# Patient Record
Sex: Female | Born: 1940
Health system: Southern US, Community
[De-identification: ages and names within clinical notes are randomized; demographics above are authoritative.]

## PROBLEM LIST (undated history)

## (undated) ENCOUNTER — Ambulatory Visit: Admission: EM | Payer: 59 | Source: Home / Self Care

## (undated) DIAGNOSIS — D869 Sarcoidosis, unspecified: Secondary | ICD-10-CM

## (undated) DIAGNOSIS — K219 Gastro-esophageal reflux disease without esophagitis: Secondary | ICD-10-CM

## (undated) DIAGNOSIS — I10 Essential (primary) hypertension: Secondary | ICD-10-CM

## (undated) DIAGNOSIS — Z8619 Personal history of other infectious and parasitic diseases: Secondary | ICD-10-CM

## (undated) DIAGNOSIS — E119 Type 2 diabetes mellitus without complications: Secondary | ICD-10-CM

## (undated) DIAGNOSIS — F32A Depression, unspecified: Secondary | ICD-10-CM

## (undated) DIAGNOSIS — I700 Atherosclerosis of aorta: Secondary | ICD-10-CM

## (undated) DIAGNOSIS — I499 Cardiac arrhythmia, unspecified: Secondary | ICD-10-CM

## (undated) DIAGNOSIS — I219 Acute myocardial infarction, unspecified: Secondary | ICD-10-CM

## (undated) DIAGNOSIS — E559 Vitamin D deficiency, unspecified: Secondary | ICD-10-CM

## (undated) DIAGNOSIS — K5792 Diverticulitis of intestine, part unspecified, without perforation or abscess without bleeding: Secondary | ICD-10-CM

## (undated) DIAGNOSIS — F329 Major depressive disorder, single episode, unspecified: Secondary | ICD-10-CM

## (undated) DIAGNOSIS — E785 Hyperlipidemia, unspecified: Secondary | ICD-10-CM

## (undated) DIAGNOSIS — F419 Anxiety disorder, unspecified: Secondary | ICD-10-CM

## (undated) DIAGNOSIS — Z955 Presence of coronary angioplasty implant and graft: Secondary | ICD-10-CM

## (undated) DIAGNOSIS — I2510 Atherosclerotic heart disease of native coronary artery without angina pectoris: Secondary | ICD-10-CM

## (undated) DIAGNOSIS — I209 Angina pectoris, unspecified: Secondary | ICD-10-CM

## (undated) DIAGNOSIS — H9190 Unspecified hearing loss, unspecified ear: Secondary | ICD-10-CM

## (undated) DIAGNOSIS — K5730 Diverticulosis of large intestine without perforation or abscess without bleeding: Secondary | ICD-10-CM

## (undated) DIAGNOSIS — M1990 Unspecified osteoarthritis, unspecified site: Secondary | ICD-10-CM

## (undated) DIAGNOSIS — K449 Diaphragmatic hernia without obstruction or gangrene: Secondary | ICD-10-CM

## (undated) HISTORY — DX: Diaphragmatic hernia without obstruction or gangrene: K44.9

## (undated) HISTORY — DX: Atherosclerotic heart disease of native coronary artery without angina pectoris: I25.10

## (undated) HISTORY — PX: CORONARY ANGIOPLASTY: SHX604

## (undated) HISTORY — DX: Personal history of other infectious and parasitic diseases: Z86.19

## (undated) HISTORY — DX: Major depressive disorder, single episode, unspecified: F32.9

## (undated) HISTORY — DX: Diverticulosis of large intestine without perforation or abscess without bleeding: K57.30

## (undated) HISTORY — DX: Diverticulitis of intestine, part unspecified, without perforation or abscess without bleeding: K57.92

## (undated) HISTORY — DX: Anxiety disorder, unspecified: F41.9

## (undated) HISTORY — DX: Gastro-esophageal reflux disease without esophagitis: K21.9

## (undated) HISTORY — DX: Essential (primary) hypertension: I10

## (undated) HISTORY — DX: Hyperlipidemia, unspecified: E78.5

## (undated) HISTORY — DX: Atherosclerosis of aorta: I70.0

## (undated) HISTORY — DX: Depression, unspecified: F32.A

## (undated) HISTORY — DX: Type 2 diabetes mellitus without complications: E11.9

## (undated) HISTORY — DX: Presence of coronary angioplasty implant and graft: Z95.5

## (undated) HISTORY — DX: Vitamin D deficiency, unspecified: E55.9

## (undated) HISTORY — PX: CARDIAC SURGERY: SHX584

## (undated) HISTORY — DX: Sarcoidosis, unspecified: D86.9

## (undated) HISTORY — PX: APPENDECTOMY: SHX54

---

## 1977-11-16 HISTORY — PX: ABDOMINAL HYSTERECTOMY: SHX81

## 2005-01-16 ENCOUNTER — Ambulatory Visit: Payer: Self-pay | Admitting: Unknown Physician Specialty

## 2005-01-21 ENCOUNTER — Ambulatory Visit: Payer: Self-pay | Admitting: Unknown Physician Specialty

## 2005-07-08 ENCOUNTER — Ambulatory Visit: Payer: Self-pay | Admitting: Internal Medicine

## 2006-08-16 ENCOUNTER — Other Ambulatory Visit: Payer: Self-pay

## 2006-08-17 ENCOUNTER — Observation Stay: Payer: Self-pay | Admitting: Internal Medicine

## 2006-09-02 ENCOUNTER — Ambulatory Visit: Payer: Self-pay | Admitting: Internal Medicine

## 2006-12-13 ENCOUNTER — Encounter: Payer: Self-pay | Admitting: Unknown Physician Specialty

## 2006-12-17 ENCOUNTER — Encounter: Payer: Self-pay | Admitting: Unknown Physician Specialty

## 2007-09-13 ENCOUNTER — Ambulatory Visit: Payer: Self-pay | Admitting: Internal Medicine

## 2008-05-23 ENCOUNTER — Ambulatory Visit: Payer: Self-pay | Admitting: Internal Medicine

## 2008-08-02 ENCOUNTER — Ambulatory Visit: Payer: Self-pay | Admitting: Gastroenterology

## 2008-09-20 ENCOUNTER — Ambulatory Visit: Payer: Self-pay | Admitting: Internal Medicine

## 2009-03-25 ENCOUNTER — Ambulatory Visit: Payer: Self-pay | Admitting: Internal Medicine

## 2009-06-27 ENCOUNTER — Emergency Department: Payer: Self-pay | Admitting: Emergency Medicine

## 2009-07-01 ENCOUNTER — Ambulatory Visit: Payer: Self-pay | Admitting: Internal Medicine

## 2009-08-09 ENCOUNTER — Observation Stay: Payer: Self-pay | Admitting: Internal Medicine

## 2009-08-19 ENCOUNTER — Other Ambulatory Visit: Payer: Self-pay | Admitting: Internal Medicine

## 2009-09-06 ENCOUNTER — Ambulatory Visit: Payer: Self-pay | Admitting: Internal Medicine

## 2009-10-29 ENCOUNTER — Ambulatory Visit: Payer: Self-pay | Admitting: Internal Medicine

## 2009-11-05 ENCOUNTER — Other Ambulatory Visit: Payer: Self-pay | Admitting: Internal Medicine

## 2009-11-14 ENCOUNTER — Ambulatory Visit: Payer: Self-pay | Admitting: Internal Medicine

## 2009-11-25 ENCOUNTER — Ambulatory Visit: Payer: Self-pay | Admitting: Internal Medicine

## 2009-12-05 ENCOUNTER — Ambulatory Visit: Payer: Self-pay | Admitting: Internal Medicine

## 2009-12-30 ENCOUNTER — Ambulatory Visit: Payer: Self-pay | Admitting: Internal Medicine

## 2010-02-21 ENCOUNTER — Ambulatory Visit: Payer: Self-pay | Admitting: Unknown Physician Specialty

## 2010-03-26 ENCOUNTER — Ambulatory Visit: Payer: Self-pay | Admitting: Unknown Physician Specialty

## 2010-03-29 ENCOUNTER — Emergency Department: Payer: Self-pay | Admitting: Emergency Medicine

## 2010-04-10 ENCOUNTER — Ambulatory Visit: Payer: Self-pay | Admitting: Internal Medicine

## 2010-07-18 ENCOUNTER — Ambulatory Visit: Payer: Self-pay | Admitting: Internal Medicine

## 2010-08-13 ENCOUNTER — Ambulatory Visit: Payer: Self-pay | Admitting: Internal Medicine

## 2010-09-08 ENCOUNTER — Encounter: Payer: Self-pay | Admitting: Internal Medicine

## 2010-09-16 ENCOUNTER — Encounter: Payer: Self-pay | Admitting: Internal Medicine

## 2010-10-16 ENCOUNTER — Ambulatory Visit: Payer: Self-pay | Admitting: Internal Medicine

## 2010-10-16 ENCOUNTER — Encounter: Payer: Self-pay | Admitting: Internal Medicine

## 2010-11-16 ENCOUNTER — Encounter: Payer: Self-pay | Admitting: Internal Medicine

## 2010-12-04 ENCOUNTER — Other Ambulatory Visit: Payer: Self-pay | Admitting: Internal Medicine

## 2010-12-17 ENCOUNTER — Encounter: Payer: Self-pay | Admitting: Internal Medicine

## 2011-01-05 ENCOUNTER — Ambulatory Visit: Payer: Self-pay | Admitting: Internal Medicine

## 2011-01-15 ENCOUNTER — Encounter: Payer: Self-pay | Admitting: Internal Medicine

## 2011-02-15 ENCOUNTER — Encounter: Payer: Self-pay | Admitting: Internal Medicine

## 2011-03-26 ENCOUNTER — Ambulatory Visit: Payer: Self-pay | Admitting: Internal Medicine

## 2011-07-10 ENCOUNTER — Ambulatory Visit: Payer: Self-pay | Admitting: Nephrology

## 2012-01-21 ENCOUNTER — Ambulatory Visit: Payer: Self-pay | Admitting: Internal Medicine

## 2012-01-21 ENCOUNTER — Emergency Department: Payer: Self-pay | Admitting: Emergency Medicine

## 2012-01-21 LAB — COMPREHENSIVE METABOLIC PANEL
Albumin: 3.6 g/dL (ref 3.4–5.0)
BUN: 15 mg/dL (ref 7–18)
Chloride: 103 mmol/L (ref 98–107)
Co2: 29 mmol/L (ref 21–32)
Creatinine: 1.16 mg/dL (ref 0.60–1.30)
EGFR (African American): 59 — ABNORMAL LOW
EGFR (Non-African Amer.): 49 — ABNORMAL LOW
Glucose: 137 mg/dL — ABNORMAL HIGH (ref 65–99)
SGOT(AST): 29 U/L (ref 15–37)
SGPT (ALT): 25 U/L

## 2012-01-21 LAB — CREATININE, SERUM
Creatinine: 0.91 mg/dL (ref 0.60–1.30)
EGFR (African American): >60

## 2012-01-21 LAB — CBC
HCT: 35.1 % (ref 35.0–47.0)
HGB: 11.7 g/dL — ABNORMAL LOW (ref 12.0–16.0)
MCHC: 33.3 g/dL (ref 32.0–36.0)
Platelet: 184 10*3/uL (ref 150–440)

## 2012-01-21 LAB — URINALYSIS, COMPLETE
Bilirubin,UR: NEGATIVE
Ketone: NEGATIVE
Leukocyte Esterase: NEGATIVE
Nitrite: NEGATIVE
Ph: 5 (ref 4.5–8.0)
Protein: NEGATIVE
RBC,UR: <1 /HPF (ref 0–5)
WBC UR: 1 /HPF (ref 0–5)

## 2012-01-21 LAB — TROPONIN I: Troponin-I: <0.02 ng/mL

## 2012-05-16 ENCOUNTER — Ambulatory Visit: Payer: Self-pay | Admitting: Internal Medicine

## 2012-08-03 ENCOUNTER — Ambulatory Visit: Payer: Self-pay

## 2012-11-16 LAB — HM PAP SMEAR: HM PAP: NORMAL

## 2013-06-01 ENCOUNTER — Ambulatory Visit: Payer: Self-pay | Admitting: Internal Medicine

## 2013-06-14 ENCOUNTER — Ambulatory Visit: Payer: Self-pay | Admitting: Unknown Physician Specialty

## 2013-06-17 ENCOUNTER — Emergency Department: Payer: Self-pay | Admitting: Emergency Medicine

## 2013-06-17 LAB — CBC
MCH: 30.3 pg (ref 26.0–34.0)
MCV: 90 fL (ref 80–100)
RBC: 3.9 10*6/uL (ref 3.80–5.20)
RDW: 13.8 % (ref 11.5–14.5)
WBC: 6.6 10*3/uL (ref 3.6–11.0)

## 2013-06-17 LAB — BASIC METABOLIC PANEL
Anion Gap: 6 — ABNORMAL LOW (ref 7–16)
BUN: 17 mg/dL (ref 7–18)
Calcium, Total: 9.6 mg/dL (ref 8.5–10.1)
Creatinine: 0.9 mg/dL (ref 0.60–1.30)
EGFR (African American): >60
Glucose: 120 mg/dL — ABNORMAL HIGH (ref 65–99)
Osmolality: 282 (ref 275–301)

## 2013-06-17 LAB — TROPONIN I: Troponin-I: <0.02 ng/mL

## 2013-12-28 DIAGNOSIS — R634 Abnormal weight loss: Secondary | ICD-10-CM | POA: Diagnosis not present

## 2013-12-28 DIAGNOSIS — K921 Melena: Secondary | ICD-10-CM | POA: Diagnosis not present

## 2014-01-25 ENCOUNTER — Ambulatory Visit: Payer: Self-pay | Admitting: Gastroenterology

## 2014-01-25 DIAGNOSIS — K449 Diaphragmatic hernia without obstruction or gangrene: Secondary | ICD-10-CM | POA: Diagnosis not present

## 2014-01-25 DIAGNOSIS — K2970 Gastritis, unspecified, without bleeding: Secondary | ICD-10-CM | POA: Diagnosis not present

## 2014-01-25 DIAGNOSIS — E119 Type 2 diabetes mellitus without complications: Secondary | ICD-10-CM | POA: Diagnosis not present

## 2014-01-25 DIAGNOSIS — Z87891 Personal history of nicotine dependence: Secondary | ICD-10-CM | POA: Diagnosis not present

## 2014-01-25 DIAGNOSIS — K921 Melena: Secondary | ICD-10-CM | POA: Diagnosis not present

## 2014-01-25 DIAGNOSIS — Z7982 Long term (current) use of aspirin: Secondary | ICD-10-CM | POA: Diagnosis not present

## 2014-01-25 DIAGNOSIS — K2990 Gastroduodenitis, unspecified, without bleeding: Secondary | ICD-10-CM | POA: Diagnosis not present

## 2014-01-25 DIAGNOSIS — Z885 Allergy status to narcotic agent status: Secondary | ICD-10-CM | POA: Diagnosis not present

## 2014-01-25 DIAGNOSIS — K2289 Other specified disease of esophagus: Secondary | ICD-10-CM | POA: Diagnosis not present

## 2014-01-25 DIAGNOSIS — E785 Hyperlipidemia, unspecified: Secondary | ICD-10-CM | POA: Diagnosis not present

## 2014-01-25 DIAGNOSIS — I10 Essential (primary) hypertension: Secondary | ICD-10-CM | POA: Diagnosis not present

## 2014-01-25 DIAGNOSIS — K648 Other hemorrhoids: Secondary | ICD-10-CM | POA: Diagnosis not present

## 2014-01-25 DIAGNOSIS — K5730 Diverticulosis of large intestine without perforation or abscess without bleeding: Secondary | ICD-10-CM | POA: Diagnosis not present

## 2014-01-25 DIAGNOSIS — Z7901 Long term (current) use of anticoagulants: Secondary | ICD-10-CM | POA: Diagnosis not present

## 2014-01-25 DIAGNOSIS — K228 Other specified diseases of esophagus: Secondary | ICD-10-CM | POA: Diagnosis not present

## 2014-01-25 DIAGNOSIS — R634 Abnormal weight loss: Secondary | ICD-10-CM | POA: Diagnosis not present

## 2014-01-25 LAB — HM COLONOSCOPY

## 2014-02-01 DIAGNOSIS — H409 Unspecified glaucoma: Secondary | ICD-10-CM | POA: Diagnosis not present

## 2014-02-01 DIAGNOSIS — H2510 Age-related nuclear cataract, unspecified eye: Secondary | ICD-10-CM | POA: Diagnosis not present

## 2014-02-01 DIAGNOSIS — H4011X0 Primary open-angle glaucoma, stage unspecified: Secondary | ICD-10-CM | POA: Diagnosis not present

## 2014-04-18 DIAGNOSIS — B351 Tinea unguium: Secondary | ICD-10-CM | POA: Diagnosis not present

## 2014-04-18 DIAGNOSIS — E119 Type 2 diabetes mellitus without complications: Secondary | ICD-10-CM | POA: Diagnosis not present

## 2014-06-04 DIAGNOSIS — I10 Essential (primary) hypertension: Secondary | ICD-10-CM | POA: Diagnosis not present

## 2014-06-04 DIAGNOSIS — I219 Acute myocardial infarction, unspecified: Secondary | ICD-10-CM | POA: Diagnosis not present

## 2014-06-04 DIAGNOSIS — I2510 Atherosclerotic heart disease of native coronary artery without angina pectoris: Secondary | ICD-10-CM | POA: Diagnosis not present

## 2014-06-04 DIAGNOSIS — R079 Chest pain, unspecified: Secondary | ICD-10-CM | POA: Diagnosis not present

## 2014-06-14 DIAGNOSIS — H409 Unspecified glaucoma: Secondary | ICD-10-CM | POA: Diagnosis not present

## 2014-06-14 DIAGNOSIS — H4011X0 Primary open-angle glaucoma, stage unspecified: Secondary | ICD-10-CM | POA: Diagnosis not present

## 2014-06-14 DIAGNOSIS — H2510 Age-related nuclear cataract, unspecified eye: Secondary | ICD-10-CM | POA: Diagnosis not present

## 2014-06-18 DIAGNOSIS — M2040 Other hammer toe(s) (acquired), unspecified foot: Secondary | ICD-10-CM | POA: Diagnosis not present

## 2014-06-18 DIAGNOSIS — B351 Tinea unguium: Secondary | ICD-10-CM | POA: Diagnosis not present

## 2014-06-18 DIAGNOSIS — E119 Type 2 diabetes mellitus without complications: Secondary | ICD-10-CM | POA: Diagnosis not present

## 2014-06-18 DIAGNOSIS — M79609 Pain in unspecified limb: Secondary | ICD-10-CM | POA: Diagnosis not present

## 2014-07-03 ENCOUNTER — Ambulatory Visit: Payer: Self-pay | Admitting: Family Medicine

## 2014-07-03 LAB — HM MAMMOGRAPHY: HM MAMMO: NORMAL

## 2014-08-03 DIAGNOSIS — H4010X0 Unspecified open-angle glaucoma, stage unspecified: Secondary | ICD-10-CM | POA: Diagnosis not present

## 2014-08-23 ENCOUNTER — Ambulatory Visit: Payer: Self-pay | Admitting: Ophthalmology

## 2014-08-23 DIAGNOSIS — I10 Essential (primary) hypertension: Secondary | ICD-10-CM | POA: Diagnosis not present

## 2014-08-23 DIAGNOSIS — Z01810 Encounter for preprocedural cardiovascular examination: Secondary | ICD-10-CM

## 2014-08-23 LAB — POTASSIUM: POTASSIUM: 4 mmol/L (ref 3.5–5.1)

## 2014-08-30 ENCOUNTER — Ambulatory Visit: Payer: Self-pay | Admitting: Ophthalmology

## 2014-08-30 DIAGNOSIS — E119 Type 2 diabetes mellitus without complications: Secondary | ICD-10-CM | POA: Diagnosis not present

## 2014-08-30 DIAGNOSIS — Z79899 Other long term (current) drug therapy: Secondary | ICD-10-CM | POA: Diagnosis not present

## 2014-08-30 DIAGNOSIS — H269 Unspecified cataract: Secondary | ICD-10-CM | POA: Diagnosis not present

## 2014-08-30 DIAGNOSIS — Z7982 Long term (current) use of aspirin: Secondary | ICD-10-CM | POA: Diagnosis not present

## 2014-08-30 DIAGNOSIS — H2512 Age-related nuclear cataract, left eye: Secondary | ICD-10-CM | POA: Diagnosis not present

## 2014-08-30 DIAGNOSIS — M1990 Unspecified osteoarthritis, unspecified site: Secondary | ICD-10-CM | POA: Diagnosis not present

## 2014-08-30 DIAGNOSIS — H409 Unspecified glaucoma: Secondary | ICD-10-CM | POA: Diagnosis not present

## 2014-08-30 DIAGNOSIS — Z886 Allergy status to analgesic agent status: Secondary | ICD-10-CM | POA: Diagnosis not present

## 2014-08-30 DIAGNOSIS — Z955 Presence of coronary angioplasty implant and graft: Secondary | ICD-10-CM | POA: Diagnosis not present

## 2014-08-30 DIAGNOSIS — I10 Essential (primary) hypertension: Secondary | ICD-10-CM | POA: Diagnosis not present

## 2014-08-30 DIAGNOSIS — H25042 Posterior subcapsular polar age-related cataract, left eye: Secondary | ICD-10-CM | POA: Diagnosis not present

## 2014-09-18 ENCOUNTER — Ambulatory Visit: Payer: Self-pay | Admitting: Family Medicine

## 2014-10-15 DIAGNOSIS — K210 Gastro-esophageal reflux disease with esophagitis: Secondary | ICD-10-CM | POA: Diagnosis not present

## 2014-10-15 DIAGNOSIS — E782 Mixed hyperlipidemia: Secondary | ICD-10-CM | POA: Diagnosis not present

## 2014-10-15 DIAGNOSIS — I25119 Atherosclerotic heart disease of native coronary artery with unspecified angina pectoris: Secondary | ICD-10-CM | POA: Diagnosis not present

## 2014-10-15 DIAGNOSIS — I10 Essential (primary) hypertension: Secondary | ICD-10-CM | POA: Diagnosis not present

## 2014-10-17 DIAGNOSIS — M79672 Pain in left foot: Secondary | ICD-10-CM | POA: Diagnosis not present

## 2014-10-17 DIAGNOSIS — E119 Type 2 diabetes mellitus without complications: Secondary | ICD-10-CM | POA: Diagnosis not present

## 2014-10-17 DIAGNOSIS — M79671 Pain in right foot: Secondary | ICD-10-CM | POA: Diagnosis not present

## 2014-10-17 DIAGNOSIS — B351 Tinea unguium: Secondary | ICD-10-CM | POA: Diagnosis not present

## 2014-11-14 LAB — LIPID PANEL
Cholesterol: 192 mg/dL (ref 0–200)
HDL: 94 mg/dL — AB (ref 35–70)
LDL Cholesterol: 91 mg/dL
Triglycerides: 36 mg/dL — AB (ref 40–160)

## 2014-11-14 LAB — HEMOGLOBIN A1C: HEMOGLOBIN A1C: 6.5 % — AB (ref 4.0–6.0)

## 2015-01-11 DIAGNOSIS — H40003 Preglaucoma, unspecified, bilateral: Secondary | ICD-10-CM | POA: Diagnosis not present

## 2015-01-16 DIAGNOSIS — E119 Type 2 diabetes mellitus without complications: Secondary | ICD-10-CM | POA: Diagnosis not present

## 2015-01-16 DIAGNOSIS — M7671 Peroneal tendinitis, right leg: Secondary | ICD-10-CM | POA: Diagnosis not present

## 2015-01-16 DIAGNOSIS — B351 Tinea unguium: Secondary | ICD-10-CM | POA: Diagnosis not present

## 2015-01-31 ENCOUNTER — Ambulatory Visit: Payer: Self-pay | Admitting: Family Medicine

## 2015-01-31 DIAGNOSIS — R062 Wheezing: Secondary | ICD-10-CM | POA: Diagnosis not present

## 2015-01-31 DIAGNOSIS — R05 Cough: Secondary | ICD-10-CM | POA: Diagnosis not present

## 2015-03-05 DIAGNOSIS — I10 Essential (primary) hypertension: Secondary | ICD-10-CM | POA: Diagnosis not present

## 2015-03-05 DIAGNOSIS — I25119 Atherosclerotic heart disease of native coronary artery with unspecified angina pectoris: Secondary | ICD-10-CM | POA: Diagnosis not present

## 2015-03-05 DIAGNOSIS — E782 Mixed hyperlipidemia: Secondary | ICD-10-CM | POA: Diagnosis not present

## 2015-03-05 DIAGNOSIS — K210 Gastro-esophageal reflux disease with esophagitis: Secondary | ICD-10-CM | POA: Diagnosis not present

## 2015-03-09 NOTE — Op Note (Signed)
PATIENT NAME:  Breanna Petty, Breanna Petty MR#:  [id number] DATE OF BIRTH:  December 29, 1940  DATE OF PROCEDURE:  08/30/2014  PREOPERATIVE DIAGNOSIS:  H25.042, posterior subcapsular cataract left eye.    POSTOPERATIVE DIAGNOSIS:  H25.042, posterior subcapsular cataract left eye.    PROCEDURE:  Phacoemulsification with posterior chamber intraocular lens left eye, model SN60WF 22.5 diopter lens.    SURGEON:  Lyla Glassing, MD  INDICATIONS:  This is a 74 year old female with decreased vision in the left eye.  PROCEDURE:  The risks and benefits of cataract surgery were discussed at length with the patient, including bleeding, infection, retinal detachment, re-operation, diplopia, ptosis, loss of vision, and loss of the eye. Informed consent was obtained. On the day of surgery, several sets of preoperative medication were administered to the operative eye including 0.5% tetracaine,1% cyclopentolate, 10% phenylephrine, 0.5% ketorolac, 0.5% gatifloxacin, and 2% lidocaine .  The patient was taken to the operating room and sedated via IV sedation. Topical tetracaine was placed in the eye. The operative eye was prepped using a 10% Betadine solution and then covered in sterile drapes leaving only the operative eye exposed. A Lieberman lid speculum was placed to provide exposure. Using 0.12 forceps and a sideport blade, a paracentesis was created. Then a mixture of BSS, preservative free lidocaine, and epinephrine was injected into the anterior chamber. Next, a 2.4 mm keratome blade was used to create a two-step full-thickness clear corneal incision temporally. The cystitome and Utrata forceps were used to create a continuous capsulorrhexis in the anterior lens capsule. BSS on a hydrodissection cannula was used to perform gentle hydrodissection. Phacoemulsification was then performed to remove the nucleus. Irrigation and aspiration was performed to remove the remaining cortical material. Provisc was injected to fill the capsular  bag and anterior chamber. A 22.5 diopter SN60WF intraocular lens was injected into the capsular bag. The Connor wand was used to rotate it into proper position in the capsular bag. Irrigation and aspiration was performed to remove the remaining Viscoelastic material from the eye. BSS on a 30-gauge cannula was used to hydrate the wound. An intracameral antibiotic was administered. The wounds were checked and found to be watertight. The lid speculum and drapes were carefully removed. Several drops of Vigamox were placed in the operative eye. The eye was covered with protective eyewear. The patient was taken to the recovery area in good condition. There were no complications.   ____________________________ Lyla Glassing, MD nm:bu D: 08/30/2014 10:52:00 ET T: 08/30/2014 14:10:20 ET JOB#: [id number]  cc: Lyla Glassing, MD, <Dictator> Lyla Glassing MD ELECTRONICALLY SIGNED 09/06/2014 13:28

## 2015-03-13 ENCOUNTER — Other Ambulatory Visit: Payer: Self-pay

## 2015-03-13 NOTE — Patient Outreach (Signed)
[name]) Care Management  03/13/2015  Breanna Petty [birth year] [id number]   Left message and asked Khalaya to call and schedule an appointment with me.   [name], RN, BA, [place], [place] Direct Dial:  [phone]  Fax:  [phone] E-mail: [email or web address] [address] [place], [place]  [address]

## 2015-04-11 DIAGNOSIS — H169 Unspecified keratitis: Secondary | ICD-10-CM | POA: Diagnosis not present

## 2015-04-16 DIAGNOSIS — L821 Other seborrheic keratosis: Secondary | ICD-10-CM | POA: Diagnosis not present

## 2015-04-16 DIAGNOSIS — H903 Sensorineural hearing loss, bilateral: Secondary | ICD-10-CM | POA: Diagnosis not present

## 2015-04-16 DIAGNOSIS — L815 Leukoderma, not elsewhere classified: Secondary | ICD-10-CM | POA: Diagnosis not present

## 2015-04-16 DIAGNOSIS — H9319 Tinnitus, unspecified ear: Secondary | ICD-10-CM | POA: Diagnosis not present

## 2015-04-16 DIAGNOSIS — H6120 Impacted cerumen, unspecified ear: Secondary | ICD-10-CM | POA: Diagnosis not present

## 2015-04-17 ENCOUNTER — Other Ambulatory Visit: Payer: Self-pay | Admitting: Family Medicine

## 2015-05-01 ENCOUNTER — Ambulatory Visit: Payer: Medicare Other

## 2015-05-04 ENCOUNTER — Encounter: Payer: Self-pay | Admitting: Family Medicine

## 2015-05-04 DIAGNOSIS — F32A Depression, unspecified: Secondary | ICD-10-CM | POA: Insufficient documentation

## 2015-05-04 DIAGNOSIS — Z955 Presence of coronary angioplasty implant and graft: Secondary | ICD-10-CM | POA: Insufficient documentation

## 2015-05-04 DIAGNOSIS — E785 Hyperlipidemia, unspecified: Secondary | ICD-10-CM | POA: Insufficient documentation

## 2015-05-04 DIAGNOSIS — E1129 Type 2 diabetes mellitus with other diabetic kidney complication: Secondary | ICD-10-CM | POA: Insufficient documentation

## 2015-05-04 DIAGNOSIS — F329 Major depressive disorder, single episode, unspecified: Secondary | ICD-10-CM | POA: Insufficient documentation

## 2015-05-04 DIAGNOSIS — B0229 Other postherpetic nervous system involvement: Secondary | ICD-10-CM | POA: Insufficient documentation

## 2015-05-04 DIAGNOSIS — E559 Vitamin D deficiency, unspecified: Secondary | ICD-10-CM | POA: Insufficient documentation

## 2015-05-04 DIAGNOSIS — K449 Diaphragmatic hernia without obstruction or gangrene: Secondary | ICD-10-CM | POA: Insufficient documentation

## 2015-05-04 DIAGNOSIS — I2510 Atherosclerotic heart disease of native coronary artery without angina pectoris: Secondary | ICD-10-CM | POA: Insufficient documentation

## 2015-05-04 DIAGNOSIS — F419 Anxiety disorder, unspecified: Secondary | ICD-10-CM

## 2015-05-04 DIAGNOSIS — I10 Essential (primary) hypertension: Secondary | ICD-10-CM | POA: Insufficient documentation

## 2015-05-04 DIAGNOSIS — R809 Proteinuria, unspecified: Secondary | ICD-10-CM

## 2015-05-04 DIAGNOSIS — K219 Gastro-esophageal reflux disease without esophagitis: Secondary | ICD-10-CM | POA: Insufficient documentation

## 2015-05-07 ENCOUNTER — Ambulatory Visit: Payer: Medicare Other

## 2015-05-07 ENCOUNTER — Ambulatory Visit (INDEPENDENT_AMBULATORY_CARE_PROVIDER_SITE_OTHER): Payer: 59 | Admitting: Family Medicine

## 2015-05-07 ENCOUNTER — Encounter: Payer: Self-pay | Admitting: Family Medicine

## 2015-05-07 VITALS — BP 136/62 | HR 86 | Temp 98.3°F | Resp 16 | Ht 67.0 in | Wt 163.3 lb

## 2015-05-07 DIAGNOSIS — F418 Other specified anxiety disorders: Secondary | ICD-10-CM | POA: Diagnosis not present

## 2015-05-07 DIAGNOSIS — F329 Major depressive disorder, single episode, unspecified: Secondary | ICD-10-CM

## 2015-05-07 DIAGNOSIS — J449 Chronic obstructive pulmonary disease, unspecified: Secondary | ICD-10-CM | POA: Insufficient documentation

## 2015-05-07 DIAGNOSIS — F419 Anxiety disorder, unspecified: Principal | ICD-10-CM

## 2015-05-07 DIAGNOSIS — N3941 Urge incontinence: Secondary | ICD-10-CM

## 2015-05-07 DIAGNOSIS — B0229 Other postherpetic nervous system involvement: Secondary | ICD-10-CM | POA: Diagnosis not present

## 2015-05-07 DIAGNOSIS — H9193 Unspecified hearing loss, bilateral: Secondary | ICD-10-CM | POA: Insufficient documentation

## 2015-05-07 DIAGNOSIS — F32A Depression, unspecified: Secondary | ICD-10-CM

## 2015-05-07 LAB — POCT URINALYSIS DIPSTICK
Bilirubin, UA: NEGATIVE
Blood, UA: NEGATIVE
Glucose, UA: NEGATIVE
LEUKOCYTES UA: NEGATIVE
Nitrite, UA: NEGATIVE
PH UA: 6.5
Protein, UA: NEGATIVE
Spec Grav, UA: 1.01
Urobilinogen, UA: 0.2

## 2015-05-07 MED ORDER — NORTRIPTYLINE HCL 10 MG PO CAPS
10.0000 mg | ORAL_CAPSULE | Freq: Every day | ORAL | Status: DC
Start: 2015-05-07 — End: 2015-06-04

## 2015-05-07 NOTE — Progress Notes (Signed)
Name: Breanna Petty   MRN: [id number]    DOB: 1941-05-16   Date:05/07/2015       Progress Note  Subjective  Chief Complaint  Chief Complaint  Patient presents with  . Flank Pain    bilateral side pain, pt states it has been going on for 15+ years and getting worst.   . Urinary Incontinence    Pt states been going on 1+ year and getting worse, she denies any burning or pain but does have urinary urgency    HPI  Post-herpetic neuralgia: she has pain on right flank on and off for the past 15 years, since a shingles - outbreak on her face and right side of back. She had multiple evaluations in the past, by GI, previous PCP and myself. She is very concerned about cancer.  She states pain is intermittent but is getting more severe and she wants to feel better.  Urinary Incontinence: she states that for the past year she has noticed urinary frequency, seems urge type, using a pad , because she can't get to the bathroom fast enough.  She denies dysuria or fever. She takes a diuretic - HCTZ 25 half pill daily   Hearing loss: went to Dr. Ladene Artist for evaluation of tinnitus or right ear and was advised to use a hearing aid.   Patient Active Problem List   Diagnosis Date Noted  . CAFL (chronic airflow limitation) 05/07/2015  . Hearing loss of both ears 05/07/2015  . Anxiety and depression 05/04/2015  . Arteriosclerosis of coronary artery 05/04/2015  . Well controlled diabetes mellitus 05/04/2015  . Dyslipidemia 05/04/2015  . Essential (primary) hypertension 05/04/2015  . Gastro-esophageal reflux disease without esophagitis 05/04/2015  . Hiatal hernia 05/04/2015  . HZV (herpes zoster virus) post herpetic neuralgia 05/04/2015  . Vitamin D deficiency 05/04/2015  . S/P coronary artery stent placement 05/04/2015    Past Surgical History  Procedure Laterality Date  . Appendectomy    . Abdominal hysterectomy  1979    Family History  Problem Relation Age of Onset  . Diabetes Daughter      History   Social History  . Marital Status: Single    Spouse Name: N/A  . Number of Children: N/A  . Years of Education: N/A   Occupational History  . Not on file.   Social History Main Topics  . Smoking status: Former Smoker -- 2.00 packs/day for 10 years    Types: Cigarettes    Quit date: 11/16/1988  . Smokeless tobacco: Never Used  . Alcohol Use: No  . Drug Use: No  . Sexual Activity: Not Currently   Other Topics Concern  . Not on file   Social History Narrative     Current outpatient prescriptions:  .  aspirin EC 81 MG tablet, Take 1 tablet by mouth daily., Disp: , Rfl:  .  Coenzyme Q10 (COQ10) 100 MG CAPS, Take 1 capsule by mouth daily., Disp: , Rfl:  .  hydrochlorothiazide (HYDRODIURIL) 25 MG tablet, TAKE 1 TABLET BY MOUTH ONCE DAILY, Disp: 30 tablet, Rfl: 2 .  losartan (COZAAR) 50 MG tablet, Take 1 tablet by mouth daily., Disp: , Rfl:  .  nitroGLYCERIN (NITROSTAT) 0.4 MG SL tablet, Place 1 tablet under the tongue as needed., Disp: , Rfl:  .  omeprazole (PRILOSEC) 20 MG capsule, Take 1 tablet by mouth daily., Disp: , Rfl:  .  pravastatin (PRAVACHOL) 40 MG tablet, Take 1 tablet by mouth daily., Disp: , Rfl:  .  sitaGLIPtin (JANUVIA) 100 MG tablet, Take 1 tablet by mouth daily., Disp: , Rfl:   Allergies  Allergen Reactions  . Ace Inhibitors     Other reaction(s): Cough  . Codeine   . Codeine Sulfate     Other reaction(s): Unknown  . Ezetimibe     Other reaction(s): Muscle Pain     ROS  Constitutional: Negative for fever or weight change.  Respiratory: Negative for cough and shortness of breath.   Cardiovascular: Negative for chest pain or palpitations.  Gastrointestinal: Negative for abdominal pain, no bowel changes.  Musculoskeletal: Negative for gait problem or joint swelling.  Skin: Negative for rash.  Neurological: Negative for dizziness or headache.  No other specific complaints in a complete review of systems (except as listed in HPI  above).  Objective  Filed Vitals:   05/07/15 1108  BP: 136/62  Pulse: 86  Temp: 98.3 F (36.8 C)  TempSrc: Oral  Resp: 16  Height: 5\' 7"  (1.702 m)  Weight: 163 lb 4.8 oz (74.072 kg)  SpO2: 98%    Body mass index is 25.57 kg/(m^2).  Physical Exam Constitutional: Patient appears well-developed and well-nourished. No distress.  HENT: Head: Normocephalic and atraumatic. Ears: B TMs ok, no erythema or effusion; Nose: Nose normal. Mouth/Throat: Oropharynx is clear and moist. No oropharyngeal exudate.  Eyes: Conjunctivae and EOM are normal. Pupils are equal, round, and reactive to light. No scleral icterus.  Neck: Normal range of motion. Neck supple. No JVD present. No thyromegaly present.  Cardiovascular: Normal rate, regular rhythm and normal heart sounds.  No murmur heard. No BLE edema. Pulmonary/Chest: Effort normal and breath sounds normal. No respiratory distress. Abdominal: Soft. Bowel sounds are normal, no distension. There is no tenderness. no masses Musculoskeletal: Normal range of motion, no joint effusions. No gross deformities Neurological: he is alert and oriented to person, place, and time. No cranial nerve deficit. Coordination, balance, strength, speech and gait are normal.  Skin: Skin is warm and dry. No rash noted. No erythema.  Psychiatric: Patient has a normal mood and affect. behavior is normal. Judgment and thought content normal.  Recent Results (from the past 2160 hour(s))  POCT urinalysis dipstick     Status: Normal   Collection Time: 05/07/15 11:35 AM  Result Value Ref Range   Color, UA yellow    Clarity, UA clear    Glucose, UA negative    Bilirubin, UA negative    Ketones, UA trace    Spec Grav, UA 1.010    Blood, UA negative    pH, UA 6.5    Protein, UA negative    Urobilinogen, UA 0.2    Nitrite, UA negative    Leukocytes, UA Negative Negative     PHQ2/9: Depression screen PHQ 2/9 05/07/2015  Decreased Interest 1  Down, Depressed, Hopeless  1  PHQ - 2 Score 2  Altered sleeping 2  Tired, decreased energy 1  Change in appetite 1  Feeling bad or failure about yourself  1  Trouble concentrating 2  Moving slowly or fidgety/restless 1  Suicidal thoughts 0  PHQ-9 Score 10  Difficult doing work/chores Somewhat difficult     Fall Risk: Fall Risk  05/07/2015  Falls in the past year? No     Assessment & Plan  1. HZV (herpes zoster virus) post herpetic neuralgia We will try medication, gave verbal information about shingles again Nortriptyline   2. Hearing loss, bilateral  Continue follow up with ENT  3. Urge incontinence of urine  -  POCT urinalysis dipstick Normal , discussed PT and medication , she wants to try medication - nortriptyline, discussed stopping HCTZ also, and since she only takes half of Losartan go up to one daily   4. Anxiety and depression She refuses medication

## 2015-05-07 NOTE — Patient Instructions (Signed)
Urinary Incontinence Urinary incontinence is the involuntary loss of urine from your bladder. CAUSES  There are many causes of urinary incontinence. They include:  Medicines.  Infections.  Prostatic enlargement, leading to overflow of urine from your bladder.  Surgery.  Neurological diseases.  Emotional factors. SIGNS AND SYMPTOMS Urinary Incontinence can be divided into four types: 1. Urge incontinence. Urge incontinence is the involuntary loss of urine before you have the opportunity to go to the bathroom. There is a sudden urge to void but not enough time to reach a bathroom. 2. Stress incontinence. Stress incontinence is the sudden loss of urine with any activity that forces urine to pass. It is commonly caused by anatomical changes to the pelvis and sphincter areas of your body. 3. Overflow incontinence. Overflow incontinence is the loss of urine from an obstructed opening to your bladder. This results in a backup of urine and a resultant buildup of pressure within the bladder. When the pressure within the bladder exceeds the closing pressure of the sphincter, the urine overflows, which causes incontinence, similar to water overflowing a dam. 4. Total incontinence. Total incontinence is the loss of urine as a result of the inability to store urine within your bladder. DIAGNOSIS  Evaluating the cause of incontinence may require:  A thorough and complete medical and obstetric history.  A complete physical exam.  Laboratory tests such as a urine culture and sensitivities. When additional tests are indicated, they can include:  An ultrasound exam.  Kidney and bladder X-rays.  Cystoscopy. This is an exam of the bladder using a narrow scope.  Urodynamic testing to test the nerve function to the bladder and sphincter areas. TREATMENT  Treatment for urinary incontinence depends on the cause:  For urge incontinence caused by a bacterial infection, antibiotics will be prescribed.  If the urge incontinence is related to medicines you take, your health care provider may have you change the medicine.  For stress incontinence, surgery to re-establish anatomical support to the bladder or sphincter, or both, will often correct the condition.  For overflow incontinence caused by an enlarged prostate, an operation to open the channel through the enlarged prostate will allow the flow of urine out of the bladder. In women with fibroids, a hysterectomy may be recommended.  For total incontinence, surgery on your urinary sphincter may help. An artificial urinary sphincter (an inflatable cuff placed around the urethra) may be required. In women who have developed a hole-like passage between their bladder and vagina (vesicovaginal fistula), surgery to close the fistula often is required. HOME CARE INSTRUCTIONS  Normal daily hygiene and the use of pads or adult diapers that are changed regularly will help prevent odors and skin damage.  Avoid caffeine. It can overstimulate your bladder.  Use the bathroom regularly. Try about every 2-3 hours to go to the bathroom, even if you do not feel the need to do so. Take time to empty your bladder completely. After urinating, wait a minute. Then try to urinate again.  For causes involving nerve dysfunction, keep a log of the medicines you take and a journal of the times you go to the bathroom. SEEK MEDICAL CARE IF:  You experience worsening of pain instead of improvement in pain after your procedure.  Your incontinence becomes worse instead of better. SEE IMMEDIATE MEDICAL CARE IF:  You experience fever or shaking chills.  You are unable to pass your urine.  You have redness spreading into your groin or down into your thighs. MAKE SURE   YOU:   Understand these instructions.   Will watch your condition.  Will get help right away if you are not doing well or get worse. Document Released: 12/10/2004 Document Revised: 08/23/2013 Document  Reviewed: 04/11/2013 ExitCare Patient Information 2015 [employer]. This information is not intended to replace advice given to you by your health care provider. Make sure you discuss any questions you have with your health care provider.  

## 2015-05-08 ENCOUNTER — Other Ambulatory Visit: Payer: Self-pay

## 2015-05-08 NOTE — Patient Outreach (Unsigned)
[place] [facility]) Care Management  [place]  05/08/2015   Breanna Petty May 20, 1941 [id number]  Subjective: Patient in for regularly scheduled visit- complains of ongoing overall pain related to "shingles pain" as defined by Dr. Joeseph Amor- patient saw her yesterday.  Patient denies pain now. Angry tone- voices doubt that this pain is shingles pain and expresses that she doesn't understand why a doctor can't fix the pain" just take this medication".  Breanna Petty tells me she is unwilling to exercise now because of the pain. When I asked about retiring, she said she could not live on current income from social security. States toe nails are too long and need cutting- denies open areas or tenderness.   Objective: BP 140/60 mmHg  Ht 1.727 m (5\' 8" )  Wt 163 lb 9.6 oz (74.208 kg)  BMI 24.88 kg/m2 Denies pain   Current Medications:  Current Outpatient Prescriptions  Medication Sig Dispense Refill  . aspirin EC 81 MG tablet Take 1 tablet by mouth daily.    . brimonidine (ALPHAGAN P) 0.1 % SOLN 2 drops Nightly.    . Coenzyme Q10 (COQ10) 100 MG CAPS Take 1 capsule by mouth daily.    Marland Kitchen latanoprost (XALATAN) 0.005 % ophthalmic solution 2 drops at bedtime.    Marland Kitchen losartan (COZAAR) 50 MG tablet Take 1 tablet by mouth daily.    . nortriptyline (PAMELOR) 10 MG capsule Take 1 capsule (10 mg total) by mouth at bedtime. 30 capsule 1  . omeprazole (PRILOSEC) 20 MG capsule Take 1 tablet by mouth daily.    . pravastatin (PRAVACHOL) 40 MG tablet Take 1 tablet by mouth daily.    . sitaGLIPtin (JANUVIA) 100 MG tablet Take 1 tablet by mouth daily.    . nitroGLYCERIN (NITROSTAT) 0.4 MG SL tablet Place 1 tablet under the tongue as needed.     No current facility-administered medications for this visit.    Functional Status:  In your present state of health, do you have any difficulty performing the following activities: 05/07/2015  Hearing? Y  Vision? Y  Difficulty concentrating or making  decisions? N  Dressing or bathing? N  Doing errands, shopping? N    Fall/Depression Screening: PHQ 2/9 Scores 05/08/2015 05/07/2015  PHQ - 2 Score 2 2  PHQ- 9 Score - 10     Plan: Although Breanna Petty will not exercise, she is willing to try the medication the MD has prescribed.  I have asked her to follow up with Dr. Vickki Muff to have her toe nails trimmed.  I have given [facility] the [facility] of [facility] and asked her to meet with them to discuss health insurance options and the option to retire.  [facility] CM Care Plan Problem One        Patient Outreach from 05/08/2015 in [place] Problem One  Potential for long term complications as a result of diabetes   Care Plan for Problem One  Active   [facility] Long Term Goal (31-90 days)  Maintain A1C less than 7%   [facility] Long Term Goal Start Date  05/08/15   Interventions for Problem One Long Term Goal  Take medication MD gave you for shingles related pain- Nortriptyline HCL 10mg 

## 2015-05-15 DIAGNOSIS — E119 Type 2 diabetes mellitus without complications: Secondary | ICD-10-CM | POA: Diagnosis not present

## 2015-05-15 DIAGNOSIS — D2371 Other benign neoplasm of skin of right lower limb, including hip: Secondary | ICD-10-CM | POA: Diagnosis not present

## 2015-05-15 DIAGNOSIS — B351 Tinea unguium: Secondary | ICD-10-CM | POA: Diagnosis not present

## 2015-05-16 ENCOUNTER — Other Ambulatory Visit: Payer: Self-pay | Admitting: Family Medicine

## 2015-05-21 ENCOUNTER — Encounter: Payer: Medicare Other | Admitting: Pharmacist

## 2015-05-24 ENCOUNTER — Telehealth: Payer: Self-pay

## 2015-05-24 NOTE — Patient Outreach (Signed)
[place] [facility]) Care Management  05/24/2015  Breanna Petty 1941/10/18 [id number]  I spoke to [name] today regarding her recent visit to see [name], [facility] this week- she had to reschedule and will see her next week.   [name] tells me she is seeing Dr. Joeseph Amor later this month.    [name] complains of ongoing pain in her buttocks which Dr. Joeseph Amor has told her is shingles pain.  Dr. Joeseph Amor started [name] on medication and [name] feels like it is working through the night and into the morning but feels it is wearing off later in the day because she notices pain as the day progresses.     [name], RN, BA, [place], [place] Direct Dial:  [phone]  Fax:  [phone] E-mail: [name] [address], [place], [place]  [address]

## 2015-05-28 ENCOUNTER — Encounter: Payer: 59 | Admitting: Pharmacist

## 2015-05-28 ENCOUNTER — Encounter (INDEPENDENT_AMBULATORY_CARE_PROVIDER_SITE_OTHER): Payer: Self-pay

## 2015-06-04 ENCOUNTER — Encounter: Payer: Self-pay | Admitting: Family Medicine

## 2015-06-04 ENCOUNTER — Ambulatory Visit (INDEPENDENT_AMBULATORY_CARE_PROVIDER_SITE_OTHER): Payer: 59 | Admitting: Family Medicine

## 2015-06-04 VITALS — BP 116/74 | HR 90 | Temp 98.3°F | Resp 16 | Ht 67.2 in | Wt 163.9 lb

## 2015-06-04 DIAGNOSIS — K219 Gastro-esophageal reflux disease without esophagitis: Secondary | ICD-10-CM

## 2015-06-04 DIAGNOSIS — B0229 Other postherpetic nervous system involvement: Secondary | ICD-10-CM

## 2015-06-04 DIAGNOSIS — E785 Hyperlipidemia, unspecified: Secondary | ICD-10-CM

## 2015-06-04 DIAGNOSIS — N3941 Urge incontinence: Secondary | ICD-10-CM | POA: Insufficient documentation

## 2015-06-04 DIAGNOSIS — E2839 Other primary ovarian failure: Secondary | ICD-10-CM

## 2015-06-04 DIAGNOSIS — E119 Type 2 diabetes mellitus without complications: Secondary | ICD-10-CM

## 2015-06-04 DIAGNOSIS — I2510 Atherosclerotic heart disease of native coronary artery without angina pectoris: Secondary | ICD-10-CM | POA: Diagnosis not present

## 2015-06-04 DIAGNOSIS — R202 Paresthesia of skin: Secondary | ICD-10-CM | POA: Diagnosis not present

## 2015-06-04 DIAGNOSIS — Z862 Personal history of diseases of the blood and blood-forming organs and certain disorders involving the immune mechanism: Secondary | ICD-10-CM | POA: Insufficient documentation

## 2015-06-04 DIAGNOSIS — I10 Essential (primary) hypertension: Secondary | ICD-10-CM

## 2015-06-04 LAB — POCT UA - MICROALBUMIN: Microalbumin Ur, POC: 20 mg/L

## 2015-06-04 LAB — POCT GLYCOSYLATED HEMOGLOBIN (HGB A1C): Hemoglobin A1C: 6.3

## 2015-06-04 MED ORDER — NORTRIPTYLINE HCL 10 MG PO CAPS: 10.0000 mg | ORAL_CAPSULE | Freq: Every day | ORAL | Status: DC

## 2015-06-04 MED ORDER — LOSARTAN POTASSIUM 50 MG PO TABS: 50.0000 mg | ORAL_TABLET | Freq: Every day | ORAL | Status: DC

## 2015-06-04 MED ORDER — PRAVASTATIN SODIUM 40 MG PO TABS: 40.0000 mg | ORAL_TABLET | Freq: Every day | ORAL | Status: DC

## 2015-06-04 MED ORDER — OMEPRAZOLE 20 MG PO CPDR: 20.0000 mg | DELAYED_RELEASE_CAPSULE | Freq: Two times a day (BID) | ORAL | Status: DC

## 2015-06-04 MED ORDER — SITAGLIPTIN PHOSPHATE 100 MG PO TABS: 100.0000 mg | ORAL_TABLET | Freq: Every day | ORAL | Status: DC

## 2015-06-04 NOTE — Patient Instructions (Signed)
Patient needs Zostavax and PCV 13 Please send me a copy when she gets it done

## 2015-06-04 NOTE — Progress Notes (Addendum)
Name: Breanna Petty   MRN: [id number]    DOB: Feb 13, 1941   Date:06/04/2015       Progress Note  Subjective  Chief Complaint  Chief Complaint  Patient presents with  . Medication Refill    3 month F/U  . Diabetes    Checks once daily Low-103 High-136  . Hypertension  . Hyperlipidemia  . Gastrophageal Reflux    HPI  DMII : she checks fsbs at home is around 120's. She denies polyphagia or polydipsia, she has urinary frequency. She has been compliant with Januvia and denies side effects of medication  HTN: she is on Losartan and denies side effects, bp is at goal. She denies chest pain or SOB  Hyperlipidemia and CAD: she is taking aspirin, statin and ARB. She is doing well, last labs were done in 10/2014  GERD: she is taking Omeprazole twice daily, she still wakes up with a bitter taste in her mouth, she is eating fresh garlic once daily because she heard it is good for prevention of blood clots.   Post-herpetic neuralgia: doing much better since started on Nortriptyline , symptoms resolved  Urge Incontinence: she states symptoms improved with Nortriptyline but still has some symptoms, she still has nocturia, about two episodes per night, but is better than before.    Patient Active Problem List   Diagnosis Date Noted  . History of sarcoidosis 06/04/2015  . Urge incontinence of urine 06/04/2015  . Hearing loss of both ears 05/07/2015  . Anxiety and depression 05/04/2015  . Arteriosclerosis of coronary artery 05/04/2015  . Well controlled diabetes mellitus 05/04/2015  . Dyslipidemia 05/04/2015  . Essential (primary) hypertension 05/04/2015  . Gastro-esophageal reflux disease without esophagitis 05/04/2015  . Hiatal hernia 05/04/2015  . HZV (herpes zoster virus) post herpetic neuralgia 05/04/2015  . Vitamin D deficiency 05/04/2015  . S/P coronary artery stent placement 05/04/2015    Past Surgical History  Procedure Laterality Date  . Appendectomy    . Abdominal  hysterectomy  1979    Family History  Problem Relation Age of Onset  . Diabetes Daughter     History   Social History  . Marital Status: Single    Spouse Name: N/A  . Number of Children: N/A  . Years of Education: N/A   Occupational History  . Not on file.   Social History Main Topics  . Smoking status: Former Smoker -- 2.00 packs/day for 10 years    Types: Cigarettes    Quit date: 11/16/1988  . Smokeless tobacco: Never Used  . Alcohol Use: No  . Drug Use: No  . Sexual Activity: Not Currently   Other Topics Concern  . Not on file   Social History Narrative     Current outpatient prescriptions:  .  aspirin EC 81 MG tablet, Take 1 tablet by mouth daily., Disp: , Rfl:  .  brimonidine (ALPHAGAN P) 0.1 % SOLN, 2 drops Nightly., Disp: , Rfl:  .  ferrous sulfate 325 (65 FE) MG tablet, Take 325 mg by mouth daily with breakfast., Disp: , Rfl:  .  latanoprost (XALATAN) 0.005 % ophthalmic solution, 2 drops at bedtime., Disp: , Rfl:  .  losartan (COZAAR) 50 MG tablet, Take 1 tablet (50 mg total) by mouth daily., Disp: 90 tablet, Rfl: 1 .  nitroGLYCERIN (NITROSTAT) 0.4 MG SL tablet, Place 1 tablet under the tongue as needed., Disp: , Rfl:  .  omeprazole (PRILOSEC) 20 MG capsule, Take 1 capsule (20 mg total) by mouth  2 (two) times daily., Disp: 180 capsule, Rfl: 1 .  pravastatin (PRAVACHOL) 40 MG tablet, Take 1 tablet (40 mg total) by mouth daily., Disp: 90 tablet, Rfl: 1 .  sitaGLIPtin (JANUVIA) 100 MG tablet, Take 1 tablet (100 mg total) by mouth daily., Disp: 90 tablet, Rfl: 1 .  Coenzyme Q10 (COQ10) 100 MG CAPS, Take 1 capsule by mouth daily., Disp: , Rfl:  .  nortriptyline (PAMELOR) 10 MG capsule, Take 1 capsule (10 mg total) by mouth at bedtime., Disp: 90 capsule, Rfl: 1  Allergies  Allergen Reactions  . Ace Inhibitors     Other reaction(s): Cough  . Codeine   . Codeine Sulfate     Other reaction(s): Unknown  . Ezetimibe     Other reaction(s): Muscle Pain      ROS  Constitutional: Negative for fever or weight change.  Respiratory: Negative for cough and shortness of breath.   Cardiovascular: Negative for chest pain or palpitations.  Gastrointestinal: Negative for abdominal pain, no bowel changes.  Musculoskeletal: Negative for gait problem or joint swelling.  Skin: Negative for rash.   Neurological: Negative for dizziness or headache. Numbness of right leg No other specific complaints in a complete review of systems (except as listed in HPI above).  Objective  Filed Vitals:   06/04/15 1130  BP: 116/74  Pulse: 90  Temp: 98.3 F (36.8 C)  TempSrc: Oral  Resp: 16  Height: 5' 7.2" (1.707 m)  Weight: 163 lb 14.4 oz (74.345 kg)  SpO2: 98%    Body mass index is 25.51 kg/(m^2).  Physical Exam  Constitutional: Patient appears well-developed and well-nourished.  No distress.  Eyes:  No scleral icterus. PERL Neck: Normal range of motion. Neck supple. Cardiovascular: Normal rate, regular rhythm and normal heart sounds.  No murmur heard. No BLE edema. Pulmonary/Chest: Effort normal and breath sounds normal. No respiratory distress. Abdominal: Soft.  There is no tenderness. Psychiatric: Patient has a normal mood and affect. behavior is normal. Judgment and thought content normal.  Recent Results (from the past 2160 hour(s))  POCT urinalysis dipstick     Status: Normal   Collection Time: 05/07/15 11:35 AM  Result Value Ref Range   Color, UA yellow    Clarity, UA clear    Glucose, UA negative    Bilirubin, UA negative    Ketones, UA trace    Spec Grav, UA 1.010    Blood, UA negative    pH, UA 6.5    Protein, UA negative    Urobilinogen, UA 0.2    Nitrite, UA negative    Leukocytes, UA Negative Negative  POCT HgB A1C     Status: None   Collection Time: 06/04/15 11:34 AM  Result Value Ref Range   Hemoglobin A1C 6.3   POCT UA - Microalbumin     Status: None   Collection Time: 06/04/15 11:35 AM  Result Value Ref Range    Microalbumin Ur, POC 20 mg/L   Creatinine, POC  mg/dL   Albumin/Creatinine Ratio, Urine, POC      Diabetic Foot Exam - Simple   Simple Foot Form  Visual Inspection  See comments:  Yes  Sensation Testing  Pulse Check  Posterior Tibialis and Dorsalis pulse intact bilaterally:  Yes  Comments  Hammer toes and corn formation  Failed monofilament test on right foot only      PHQ2/9: Depression screen [facility] 2/9 05/08/2015 05/07/2015  Decreased Interest 1 1  Down, Depressed, Hopeless 1 1  PHQ -  2 Score 2 2  Altered sleeping - 2  Tired, decreased energy - 1  Change in appetite - 1  Feeling bad or failure about yourself  - 1  Trouble concentrating - 2  Moving slowly or fidgety/restless - 1  Suicidal thoughts - 0  PHQ-9 Score - 10  Difficult doing work/chores - Somewhat difficult    Fall Risk: Fall Risk  05/08/2015 05/07/2015  Falls in the past year? Yes No  Number falls in past yr: 1 -  Injury with Fall? No -  Risk for fall due to : History of fall(s) -    Assessment & Plan  1. Diabetes mellitus, stable Discussed diet and exercise, she has paresthesia of right foot, but chronic and not sure if secondary to DM - POCT HgB A1C - POCT UA - Microalbumin - sitaGLIPtin (JANUVIA) 100 MG tablet; Take 1 tablet (100 mg total) by mouth daily.  Dispense: 90 tablet; Refill: 1  2. Arteriosclerosis of coronary artery Continue medication and aspirin - pravastatin (PRAVACHOL) 40 MG tablet; Take 1 tablet (40 mg total) by mouth daily.  Dispense: 90 tablet; Refill: 1  3. HZV (herpes zoster virus) post herpetic neuralgia Doing well on medication  - nortriptyline (PAMELOR) 10 MG capsule; Take 1 capsule (10 mg total) by mouth at bedtime.  Dispense: 90 capsule; Refill: 1  4. Gastro-esophageal reflux disease without esophagitis Advised to change diet and try to wean self off   5. Urge incontinence of urine  - nortriptyline (PAMELOR) 10 MG capsule; Take 1 capsule (10 mg total) by mouth at  bedtime.  Dispense: 90 capsule; Refill: 1  6. Dyslipidemia  - pravastatin (PRAVACHOL) 40 MG tablet; Take 1 tablet (40 mg total) by mouth daily.  Dispense: 90 tablet; Refill: 1  7. Hypertension, benign At goal  - losartan (COZAAR) 50 MG tablet; Take 1 tablet (50 mg total) by mouth daily.  Dispense: 90 tablet; Refill: 1  8. Paresthesia of right foot She states it has been going on for years, only right foot, needs to sleep with socks at night, discussed NCS/EMG , treatment or to monitor and she chose the later.  No weakness  9. Ovarian failure  - DG Bone Density; Future

## 2015-06-12 ENCOUNTER — Ambulatory Visit (INDEPENDENT_AMBULATORY_CARE_PROVIDER_SITE_OTHER): Payer: 59

## 2015-06-12 DIAGNOSIS — Z23 Encounter for immunization: Secondary | ICD-10-CM | POA: Diagnosis not present

## 2015-06-26 ENCOUNTER — Ambulatory Visit
Admission: RE | Admit: 2015-06-26 | Discharge: 2015-06-26 | Disposition: A | Discharge status: Home / Self Care | Payer: 59 | Source: Ambulatory Visit | Attending: Family Medicine | Admitting: Family Medicine

## 2015-06-26 DIAGNOSIS — E2839 Other primary ovarian failure: Secondary | ICD-10-CM | POA: Diagnosis present

## 2015-06-26 DIAGNOSIS — Z13820 Encounter for screening for osteoporosis: Secondary | ICD-10-CM | POA: Diagnosis not present

## 2015-06-26 NOTE — Progress Notes (Signed)
Patient notified

## 2015-07-05 DIAGNOSIS — H4011X2 Primary open-angle glaucoma, moderate stage: Secondary | ICD-10-CM | POA: Diagnosis not present

## 2015-07-08 DIAGNOSIS — M4316 Spondylolisthesis, lumbar region: Secondary | ICD-10-CM | POA: Diagnosis not present

## 2015-07-08 DIAGNOSIS — M4310 Spondylolisthesis, site unspecified: Secondary | ICD-10-CM | POA: Diagnosis not present

## 2015-07-08 DIAGNOSIS — M5136 Other intervertebral disc degeneration, lumbar region: Secondary | ICD-10-CM | POA: Diagnosis not present

## 2015-07-11 ENCOUNTER — Other Ambulatory Visit: Payer: Self-pay | Admitting: Family Medicine

## 2015-07-11 ENCOUNTER — Encounter: Payer: Self-pay | Admitting: Family Medicine

## 2015-07-11 DIAGNOSIS — M4316 Spondylolisthesis, lumbar region: Secondary | ICD-10-CM

## 2015-07-11 DIAGNOSIS — M5136 Other intervertebral disc degeneration, lumbar region: Secondary | ICD-10-CM

## 2015-07-11 DIAGNOSIS — M51369 Other intervertebral disc degeneration, lumbar region without mention of lumbar back pain or lower extremity pain: Secondary | ICD-10-CM | POA: Insufficient documentation

## 2015-07-11 MED ORDER — MELOXICAM 15 MG PO TABS: 15.0000 mg | ORAL_TABLET | Freq: Every day | ORAL | Status: DC

## 2015-07-16 ENCOUNTER — Telehealth: Payer: Self-pay

## 2015-08-23 ENCOUNTER — Telehealth: Payer: Self-pay

## 2015-09-02 DIAGNOSIS — E782 Mixed hyperlipidemia: Secondary | ICD-10-CM | POA: Diagnosis not present

## 2015-09-02 DIAGNOSIS — I2510 Atherosclerotic heart disease of native coronary artery without angina pectoris: Secondary | ICD-10-CM | POA: Diagnosis not present

## 2015-09-02 DIAGNOSIS — I10 Essential (primary) hypertension: Secondary | ICD-10-CM | POA: Diagnosis not present

## 2015-09-04 ENCOUNTER — Encounter: Payer: Self-pay | Admitting: Family Medicine

## 2015-09-04 ENCOUNTER — Ambulatory Visit (INDEPENDENT_AMBULATORY_CARE_PROVIDER_SITE_OTHER): Payer: 59 | Admitting: Family Medicine

## 2015-09-04 VITALS — BP 136/74 | HR 88 | Temp 98.6°F | Resp 14 | Ht 67.0 in | Wt 166.7 lb

## 2015-09-04 DIAGNOSIS — Z23 Encounter for immunization: Secondary | ICD-10-CM | POA: Diagnosis not present

## 2015-09-04 DIAGNOSIS — E559 Vitamin D deficiency, unspecified: Secondary | ICD-10-CM | POA: Diagnosis not present

## 2015-09-04 DIAGNOSIS — E1159 Type 2 diabetes mellitus with other circulatory complications: Secondary | ICD-10-CM

## 2015-09-04 DIAGNOSIS — E785 Hyperlipidemia, unspecified: Secondary | ICD-10-CM

## 2015-09-04 DIAGNOSIS — I2510 Atherosclerotic heart disease of native coronary artery without angina pectoris: Secondary | ICD-10-CM

## 2015-09-04 DIAGNOSIS — J449 Chronic obstructive pulmonary disease, unspecified: Secondary | ICD-10-CM | POA: Insufficient documentation

## 2015-09-04 DIAGNOSIS — I10 Essential (primary) hypertension: Secondary | ICD-10-CM | POA: Diagnosis not present

## 2015-09-04 DIAGNOSIS — I252 Old myocardial infarction: Secondary | ICD-10-CM | POA: Insufficient documentation

## 2015-09-04 LAB — POCT GLYCOSYLATED HEMOGLOBIN (HGB A1C): Hemoglobin A1C: 6.2

## 2015-09-04 NOTE — Progress Notes (Signed)
Name: Breanna Petty   MRN: [id number]    DOB: [birth year]   Date:09/04/2015       Progress Note  Subjective  Chief Complaint  Chief Complaint  Patient presents with  . Medication Refill    follow-up  . Diabetes    checks once daily, Low-80 Average-101 High 187  . Gastroesophageal Reflux    Improved since stopping Cinnamon pill  . Hypertension  . Hyperlipidemia    HPI   DMII : she checks fsbs at home is around 100's. . She denies polyphagia or polydipsia, she has urinary frequency. She has been compliant with Januvia and denies side effects of medication. She has CAD and is status post stent placement, no chest pain, and is taking aspirin daily   HTN: she is on Losartan and denies side effects, bp is at goal. She denies chest pain, no palpitation or SOB. Compliant with medication  Hyperlipidemia and CAD: she is taking aspirin, statin and ARB. She is doing well, last labs were done in 10/2014 we will recheck it now  GERD: she is taking Omeprazole twice daily, she still wakes up with a bitter taste in her mouth, she stop taking cinnamon but is still eating fresh garlic but not every day now and GERD improved  Post-herpetic neuralgia: doing much better since started on Nortriptyline , symptoms resolved  Urge Incontinence: she states symptoms improved with Nortriptyline but still has some symptoms, she still has nocturia, about two episodes per night, but is better than before. We checked urine and it was negative in June 2016  Patient Active Problem List   Diagnosis Date Noted  . Chronic obstructive pulmonary disease ([place]) 09/04/2015  . History of MI (myocardial infarction) 09/04/2015  . Spondylolisthesis at L4-L5 level 07/11/2015  . DDD (degenerative disc disease), lumbar 07/11/2015  . History of sarcoidosis 06/04/2015  . Urge incontinence of urine 06/04/2015  . Hearing loss of both ears 05/07/2015  . Anxiety and depression 05/04/2015  . Arteriosclerosis of coronary artery  05/04/2015  . Well controlled diabetes mellitus ([place]) 05/04/2015  . Dyslipidemia 05/04/2015  . Essential (primary) hypertension 05/04/2015  . Gastro-esophageal reflux disease without esophagitis 05/04/2015  . Hiatal hernia 05/04/2015  . HZV (herpes zoster virus) post herpetic neuralgia 05/04/2015  . Vitamin D deficiency 05/04/2015  . S/P coronary artery stent placement 05/04/2015    Past Surgical History  Procedure Laterality Date  . Appendectomy    . Abdominal hysterectomy  1979    Family History  Problem Relation Age of Onset  . Diabetes Daughter     Social History   Social History  . Marital Status: Single    Spouse Name: N/A  . Number of Children: N/A  . Years of Education: N/A   Occupational History  . Not on file.   Social History Main Topics  . Smoking status: Former Smoker -- 2.00 packs/day for 10 years    Types: Cigarettes    Quit date: 11/16/1988  . Smokeless tobacco: Never Used  . Alcohol Use: No  . Drug Use: No  . Sexual Activity: Not Currently   Other Topics Concern  . Not on file   Social History Narrative     Current outpatient prescriptions:  .  aspirin EC 81 MG tablet, Take 1 tablet by mouth daily., Disp: , Rfl:  .  brimonidine (ALPHAGAN P) 0.1 % SOLN, 2 drops Nightly., Disp: , Rfl:  .  Coenzyme Q10 (COQ10) 100 MG CAPS, Take 1 capsule by mouth daily., Disp: ,  Rfl:  .  ferrous sulfate 325 (65 FE) MG tablet, Take 325 mg by mouth daily with breakfast., Disp: , Rfl:  .  latanoprost (XALATAN) 0.005 % ophthalmic solution, 2 drops at bedtime., Disp: , Rfl:  .  losartan (COZAAR) 50 MG tablet, Take 1 tablet (50 mg total) by mouth daily., Disp: 90 tablet, Rfl: 1 .  meloxicam (MOBIC) 15 MG tablet, Take 1 tablet (15 mg total) by mouth daily., Disp: 30 tablet, Rfl: 0 .  nitroGLYCERIN (NITROSTAT) 0.4 MG SL tablet, Place 1 tablet under the tongue as needed., Disp: , Rfl:  .  nortriptyline (PAMELOR) 10 MG capsule, Take 1 capsule (10 mg total) by mouth at  bedtime., Disp: 90 capsule, Rfl: 1 .  omeprazole (PRILOSEC) 20 MG capsule, Take 1 capsule (20 mg total) by mouth 2 (two) times daily., Disp: 180 capsule, Rfl: 1 .  pravastatin (PRAVACHOL) 40 MG tablet, Take 1 tablet (40 mg total) by mouth daily., Disp: 90 tablet, Rfl: 1 .  sitaGLIPtin (JANUVIA) 100 MG tablet, Take 1 tablet (100 mg total) by mouth daily., Disp: 90 tablet, Rfl: 1  Allergies  Allergen Reactions  . Ace Inhibitors     Other reaction(s): Cough  . Codeine Swelling  . Codeine Sulfate     Other reaction(s): Unknown  . Ezetimibe     Other reaction(s): Muscle Pain  . Other     Other reaction(s): Muscle Pain     ROS  Constitutional: Negative for fever or weight change.  Respiratory: Negative for cough and shortness of breath.   Cardiovascular: Negative for chest pain or palpitations.  Gastrointestinal: Negative for abdominal pain, no bowel changes.  Musculoskeletal: Negative for gait problem or joint swelling. Some low back pain Skin: Negative for rash.  Neurological: Negative for dizziness or headache.  No other specific complaints in a complete review of systems (except as listed in HPI above).  Objective  Filed Vitals:   09/04/15 1139  BP: 136/74  Pulse: 88  Temp: 98.6 F (37 C)  TempSrc: Oral  Resp: 14  Height: 5\' 7"  (1.702 m)  Weight: 166 lb 11.2 oz (75.615 kg)  SpO2: 95%    Body mass index is 26.1 kg/(m^2).  Physical Exam  Constitutional: Patient appears well-developed and well-nourished. Obese No distress.  HEENT: head atraumatic, normocephalic, pupils equal and reactive to light,  neck supple, throat within normal limits Cardiovascular: Normal rate, regular rhythm and normal heart sounds.  No murmur heard. No BLE edema. Pulmonary/Chest: Effort normal and breath sounds normal. No respiratory distress. Abdominal: Soft.  There is no tenderness. Psychiatric: Patient has a normal mood and affect. behavior is normal. Judgment and thought content  normal.   Recent Results (from the past 2160 hour(s))  POCT HgB A1C     Status: Abnormal   Collection Time: 09/04/15 11:41 AM  Result Value Ref Range   Hemoglobin A1C 6.2     PHQ2/9: Depression screen [facility] 2/9 09/04/2015 05/08/2015 05/07/2015  Decreased Interest 0 1 1  Down, Depressed, Hopeless 0 1 1  PHQ - 2 Score 0 2 2  Altered sleeping - - 2  Tired, decreased energy - - 1  Change in appetite - - 1  Feeling bad or failure about yourself  - - 1  Trouble concentrating - - 2  Moving slowly or fidgety/restless - - 1  Suicidal thoughts - - 0  PHQ-9 Score - - 10  Difficult doing work/chores - - Somewhat difficult     Fall Risk: Fall Risk  09/04/2015 05/08/2015 05/07/2015  Falls in the past year? Yes Yes No  Number falls in past yr: 1 1 -  Injury with Fall? No No -  Risk for fall due to : - History of fall(s) -     Functional Status Survey: Is the patient deaf or have difficulty hearing?: No Does the patient have difficulty seeing, even when wearing glasses/contacts?: Yes (reading glasses) Does the patient have difficulty concentrating, remembering, or making decisions?: No Does the patient have difficulty walking or climbing stairs?: No Does the patient have difficulty dressing or bathing?: No Does the patient have difficulty doing errands alone such as visiting a doctor's office or shopping?: No   Assessment & Plan  1. Type 2 diabetes mellitus with other circulatory complications (HCC)  Doing well, no hypoglycemic episodes, tolerating medication well - POCT HgB A1C  2. Needs flu shot   had it at work   3. Need for pneumococcal vaccination  - Pneumococcal polysaccharide vaccine 23-valent greater than or equal to 2yo subcutaneous/IM  4. Hypertension, benign  At goal  - Comprehensive metabolic panel  5. Arteriosclerosis of coronary artery  Continue follow up with Dr. Nehemiah Massed, s/p stent, on statin therapy, ARB and aspirin  6. Dyslipidemia  - Lipid  panel  7. Vitamin D deficiency  - Vit D  25 hydroxy (rtn osteoporosis monitoring)

## 2015-09-10 ENCOUNTER — Telehealth: Payer: Self-pay

## 2015-09-11 ENCOUNTER — Other Ambulatory Visit: Payer: Self-pay | Admitting: Pharmacist

## 2015-09-13 NOTE — Patient Outreach (Signed)
[place] [facility]) Care Management  [place]   09/13/2015  JODI CRISCUOLO [birth year] [id number]  Subjective: Ms. Huckeba is a 74 year old female here today for the initial assessment with the clinical pharmacist for her [facility] to Wellness program. Her only complaint today was the pain in her buttock area that moves down toward her leg. She was recently seen by Dr. Ancil Boozer for her diabetes check up. She has an appointment with Dr. Vickki Muff on 09-16-15 to get her toe nails cut. Her last visit with the cardiologist was on 09/02/15.   Objective:  BP was 140/78 mmHg. A1c was 6.2% on 09/04/15 at Dr. Ancil Boozer office.  Current Medications: Current Outpatient Prescriptions  Medication Sig Dispense Refill  . aspirin EC 81 MG tablet Take 1 tablet by mouth daily.    . brimonidine (ALPHAGAN P) 0.1 % SOLN 2 drops Nightly.    . latanoprost (XALATAN) 0.005 % ophthalmic solution 2 drops at bedtime.    Marland Kitchen losartan (COZAAR) 50 MG tablet Take 1 tablet (50 mg total) by mouth daily. 90 tablet 1  . meloxicam (MOBIC) 15 MG tablet Take 1 tablet (15 mg total) by mouth daily. 30 tablet 0  . nitroGLYCERIN (NITROSTAT) 0.4 MG SL tablet Place 1 tablet under the tongue as needed.    . nortriptyline (PAMELOR) 10 MG capsule Take 1 capsule (10 mg total) by mouth at bedtime. 90 capsule 1  . omeprazole (PRILOSEC) 20 MG capsule Take 1 capsule (20 mg total) by mouth 2 (two) times daily. 180 capsule 1  . pravastatin (PRAVACHOL) 40 MG tablet Take 1 tablet (40 mg total) by mouth daily. 90 tablet 1  . sitaGLIPtin (JANUVIA) 100 MG tablet Take 1 tablet (100 mg total) by mouth daily. 90 tablet 1  . Coenzyme Q10 (COQ10) 100 MG CAPS Take 1 capsule by mouth daily.     No current facility-administered medications for this visit.    Functional Status: In your present state of health, do you have any difficulty performing the following activities: 09/11/2015 09/04/2015  Hearing? N N  Vision? Y Y  Difficulty concentrating  or making decisions? N N  Walking or climbing stairs? Y N  Dressing or bathing? N N  Doing errands, shopping? N N    Fall/Depression Screening: PHQ 2/9 Scores 09/11/2015 09/04/2015 05/08/2015 05/07/2015  PHQ - 2 Score 1 0 2 2  PHQ- 9 Score - - - 10   [facility] CM Care Plan Problem One        Most Recent Value   Care Plan Problem One  Potential for long term complications as a result of diabetes   Role Documenting the Problem One  Clinical Pharmacist   Care Plan for Problem One  Active   [facility] Long Term Goal (31-90 days)  Maintain A1C less than 7%,  evidenced by POCT at MD's office   Interventions for Problem One Long Term Goal  Continue to take medications as prescribed,  consider exercising one day per week to start inside or outside of hospital    [facility] CM Care Plan Problem Two        Most Recent Value   Care Plan for Problem Two  Active   Interventions for Problem Two Long Term Goal   Explained the importance of monitoring the post-prandial blood sugar to see how well medications are working   [facility] Long Term Goal (31-90) days  Continue to check fasting blood sugars,  also check 2 hour post-prandial blood sugars 1-2  times per week,  evidenced by patient's glucose monitor   [facility] Long Term Goal Start Date  09/11/15       Assessment: 1. Diabetes: A1c at goal of less than 7% in October 2016 2. Hypertension: BP at goal today of less than 140/90 mmHg 3. Annual depression assessment completed today. 4. Nutritional and quality of life assessments completed today. Nutritional assessment due 11/2015. Qualify of life assessment due 3/17  Plan: 1. Dentist appointment on 09/24/15; Foot doctor appointment (toe nails cut) on 08/29/15. 2. Ms. Benedict will start exercising 1 time per week and build up as tolerated 3. Ms. Petti will check blood glucose 2 hours after a meal, 1-2 times per week and continue checking her daily fasting blood glucose. 4. Ms. Pilling will follow up with the [facility] pharmacist  in 3 months.   Elinda Bunten K. Dicky Doe, PharmD [place] [facility] [phone]

## 2015-09-14 ENCOUNTER — Encounter: Payer: Self-pay | Admitting: Pharmacist

## 2015-09-16 DIAGNOSIS — E119 Type 2 diabetes mellitus without complications: Secondary | ICD-10-CM | POA: Diagnosis not present

## 2015-09-16 DIAGNOSIS — E559 Vitamin D deficiency, unspecified: Secondary | ICD-10-CM | POA: Diagnosis not present

## 2015-09-16 DIAGNOSIS — I10 Essential (primary) hypertension: Secondary | ICD-10-CM | POA: Diagnosis not present

## 2015-09-16 DIAGNOSIS — B351 Tinea unguium: Secondary | ICD-10-CM | POA: Diagnosis not present

## 2015-09-16 DIAGNOSIS — D2371 Other benign neoplasm of skin of right lower limb, including hip: Secondary | ICD-10-CM | POA: Diagnosis not present

## 2015-09-16 DIAGNOSIS — E785 Hyperlipidemia, unspecified: Secondary | ICD-10-CM | POA: Diagnosis not present

## 2015-09-17 LAB — COMPREHENSIVE METABOLIC PANEL
ALBUMIN: 4.1 g/dL (ref 3.5–4.8)
ALT: 18 IU/L (ref 0–32)
AST: 24 IU/L (ref 0–40)
Albumin/Globulin Ratio: 1.6 (ref 1.1–2.5)
Alkaline Phosphatase: 75 IU/L (ref 39–117)
BUN / CREAT RATIO: 22 (ref 11–26)
BUN: 18 mg/dL (ref 8–27)
Bilirubin Total: 0.4 mg/dL (ref 0.0–1.2)
CALCIUM: 9.3 mg/dL (ref 8.7–10.3)
CO2: 26 mmol/L (ref 18–29)
Chloride: 100 mmol/L (ref 97–106)
Creatinine, Ser: 0.81 mg/dL (ref 0.57–1.00)
GFR calc non Af Amer: 72 mL/min/{1.73_m2} (ref >59)
GFR, EST AFRICAN AMERICAN: 83 mL/min/{1.73_m2} (ref >59)
GLUCOSE: 116 mg/dL — AB (ref 65–99)
Globulin, Total: 2.6 g/dL (ref 1.5–4.5)
Potassium: 4.6 mmol/L (ref 3.5–5.2)
Sodium: 139 mmol/L (ref 136–144)
TOTAL PROTEIN: 6.7 g/dL (ref 6.0–8.5)

## 2015-09-17 LAB — LIPID PANEL
Chol/HDL Ratio: 1.8 ratio units (ref 0.0–4.4)
Cholesterol, Total: 179 mg/dL (ref 100–199)
HDL: 97 mg/dL (ref >39)
LDL Calculated: 74 mg/dL (ref 0–99)
Triglycerides: 42 mg/dL (ref 0–149)
VLDL Cholesterol Cal: 8 mg/dL (ref 5–40)

## 2015-09-17 LAB — VITAMIN D 25 HYDROXY (VIT D DEFICIENCY, FRACTURES): Vit D, 25-Hydroxy: 36.3 ng/mL (ref 30.0–100.0)

## 2015-09-18 DIAGNOSIS — M7542 Impingement syndrome of left shoulder: Secondary | ICD-10-CM | POA: Diagnosis not present

## 2015-10-14 ENCOUNTER — Emergency Department: Payer: 59

## 2015-10-14 ENCOUNTER — Emergency Department
Admission: EM | Admit: 2015-10-14 | Discharge: 2015-10-14 | Disposition: A | Discharge status: Home / Self Care | Payer: 59 | Attending: Emergency Medicine | Admitting: Emergency Medicine

## 2015-10-14 DIAGNOSIS — Z7982 Long term (current) use of aspirin: Secondary | ICD-10-CM | POA: Diagnosis not present

## 2015-10-14 DIAGNOSIS — Z79899 Other long term (current) drug therapy: Secondary | ICD-10-CM | POA: Insufficient documentation

## 2015-10-14 DIAGNOSIS — R51 Headache: Secondary | ICD-10-CM | POA: Diagnosis not present

## 2015-10-14 DIAGNOSIS — E119 Type 2 diabetes mellitus without complications: Secondary | ICD-10-CM | POA: Diagnosis not present

## 2015-10-14 DIAGNOSIS — Z87891 Personal history of nicotine dependence: Secondary | ICD-10-CM | POA: Diagnosis not present

## 2015-10-14 DIAGNOSIS — R519 Headache, unspecified: Secondary | ICD-10-CM

## 2015-10-14 DIAGNOSIS — I10 Essential (primary) hypertension: Secondary | ICD-10-CM | POA: Insufficient documentation

## 2015-10-14 DIAGNOSIS — Z791 Long term (current) use of non-steroidal anti-inflammatories (NSAID): Secondary | ICD-10-CM | POA: Insufficient documentation

## 2015-10-14 DIAGNOSIS — I159 Secondary hypertension, unspecified: Secondary | ICD-10-CM | POA: Insufficient documentation

## 2015-10-14 NOTE — ED Notes (Signed)
Awoke this AM with a headache, took her blood pressure medication today , but missed a dose yesterday , while at work today a tech check her blood pressure and was noted to have a systolic BP of over [id number], pt alert and oriented

## 2015-10-14 NOTE — Discharge Instructions (Signed)
Please be sure to take your blood pressure medication as prescribed. Please return to the emergency department for any further headache, blurred vision, difficulty with speech, arm or leg weakness or numbness or any other new or concerning symptoms.   General Headache Without Cause A headache is pain or discomfort felt around the head or neck area. The specific cause of a headache may not be found. There are many causes and types of headaches. A few common ones are:  Tension headaches.  Migraine headaches.  Cluster headaches.  Chronic daily headaches. HOME CARE INSTRUCTIONS  Watch your condition for any changes. Take these steps to help with your condition: Managing Pain  Take over-the-counter and prescription medicines only as told by your health care provider.  Lie down in a dark, quiet room when you have a headache.  If directed, apply ice to the head and neck area:  Put ice in a plastic bag.  Place a towel between your skin and the bag.  Leave the ice on for 20 minutes, 2-3 times per day.  Use a heating pad or hot shower to apply heat to the head and neck area as told by your health care provider.  Keep lights dim if bright lights bother you or make your headaches worse. Eating and Drinking  Eat meals on a regular schedule.  Limit alcohol use.  Decrease the amount of caffeine you drink, or stop drinking caffeine. General Instructions  Keep all follow-up visits as told by your health care provider. This is important.  Keep a headache journal to help find out what may trigger your headaches. For example, write down:  What you eat and drink.  How much sleep you get.  Any change to your diet or medicines.  Try massage or other relaxation techniques.  Limit stress.  Sit up straight, and do not tense your muscles.  Do not use tobacco products, including cigarettes, chewing tobacco, or e-cigarettes. If you need help quitting, ask your health care  provider.  Exercise regularly as told by your health care provider.  Sleep on a regular schedule. Get 7-9 hours of sleep, or the amount recommended by your health care provider. SEEK MEDICAL CARE IF:   Your symptoms are not helped by medicine.  You have a headache that is different from the usual headache.  You have nausea or you vomit.  You have a fever. SEEK IMMEDIATE MEDICAL CARE IF:   Your headache becomes severe.  You have repeated vomiting.  You have a stiff neck.  You have a loss of vision.  You have problems with speech.  You have pain in the eye or ear.  You have muscular weakness or loss of muscle control.  You lose your balance or have trouble walking.  You feel faint or pass out.  You have confusion.   This information is not intended to replace advice given to you by your health care provider. Make sure you discuss any questions you have with your health care provider.   Document Released: 11/02/2005 Document Revised: 07/24/2015 Document Reviewed: 02/25/2015 Elsevier Interactive Patient Education [employer].

## 2015-10-14 NOTE — ED Provider Notes (Signed)
[facility] Emergency Department Provider Note   ____________________________________________  Time seen: 1650  I have reviewed the triage vital signs and the nursing notes.   HISTORY  Chief Complaint Headache   History limited by: Not Limited   HPI Breanna Petty is a 74 y.o. female who presents to the emergency department today because of concerns for high blood pressure as well as headache. The patient states that she started developing a severe headache shortly after waking up this morning. She states that it was located at the top of her head. It has been consistently present however has gradually gotten better throughout the day. She now states it is quite minor. She denies any blurred vision difficulty with speech and weakness or numbness during this episode. She does state that her blood pressure was elevated this morning. She thinks that she forgot to take her blood pressure medication yesterday and thinks that that might be the cause for high blood pressure. She denies any fevers, chest pain or shortness of breath.   Past Medical History  Diagnosis Date  . Hypertension   . Depression   . Anxiety   . Hyperlipidemia   . Diabetes mellitus without complication ([place])   . Vitamin D deficiency   . Atherosclerotic heart disease   . GERD (gastroesophageal reflux disease)   . History of shingles   . History of heart artery stent   . Hiatal hernia   . Sarcoidosis [facility])     Patient Active Problem List   Diagnosis Date Noted  . Chronic obstructive pulmonary disease ([place]) 09/04/2015  . History of MI (myocardial infarction) 09/04/2015  . Spondylolisthesis at L4-L5 level 07/11/2015  . DDD (degenerative disc disease), lumbar 07/11/2015  . History of sarcoidosis 06/04/2015  . Urge incontinence of urine 06/04/2015  . Hearing loss of both ears 05/07/2015  . Anxiety and depression 05/04/2015  . Arteriosclerosis of coronary artery 05/04/2015  . Well  controlled diabetes mellitus ([place]) 05/04/2015  . Dyslipidemia 05/04/2015  . Essential (primary) hypertension 05/04/2015  . Gastro-esophageal reflux disease without esophagitis 05/04/2015  . Hiatal hernia 05/04/2015  . HZV (herpes zoster virus) post herpetic neuralgia 05/04/2015  . Vitamin D deficiency 05/04/2015  . S/P coronary artery stent placement 05/04/2015    Past Surgical History  Procedure Laterality Date  . Appendectomy    . Abdominal hysterectomy  1979    Current Outpatient Rx  Name  Route  Sig  Dispense  Refill  . aspirin EC 81 MG tablet   Oral   Take 1 tablet by mouth daily.         . brimonidine (ALPHAGAN P) 0.1 % SOLN      2 drops Nightly.         . Coenzyme Q10 (COQ10) 100 MG CAPS   Oral   Take 1 capsule by mouth daily.         Marland Kitchen latanoprost (XALATAN) 0.005 % ophthalmic solution      2 drops at bedtime.         Marland Kitchen losartan (COZAAR) 50 MG tablet   Oral   Take 1 tablet (50 mg total) by mouth daily.   90 tablet   1   . meloxicam (MOBIC) 15 MG tablet   Oral   Take 1 tablet (15 mg total) by mouth daily.   30 tablet   0   . nitroGLYCERIN (NITROSTAT) 0.4 MG SL tablet   Sublingual   Place 1 tablet under the tongue as needed.         Marland Kitchen  nortriptyline (PAMELOR) 10 MG capsule   Oral   Take 1 capsule (10 mg total) by mouth at bedtime.   90 capsule   1   . omeprazole (PRILOSEC) 20 MG capsule   Oral   Take 1 capsule (20 mg total) by mouth 2 (two) times daily.   180 capsule   1   . pravastatin (PRAVACHOL) 40 MG tablet   Oral   Take 1 tablet (40 mg total) by mouth daily.   90 tablet   1   . sitaGLIPtin (JANUVIA) 100 MG tablet   Oral   Take 1 tablet (100 mg total) by mouth daily.   90 tablet   1     Allergies Ace inhibitors; Codeine; Codeine sulfate; Ezetimibe; and Other  Family History  Problem Relation Age of Onset  . Diabetes Daughter     Social History Social History  Substance Use Topics  . Smoking status: Former Smoker  -- 2.00 packs/day for 10 years    Types: Cigarettes    Quit date: 11/16/1988  . Smokeless tobacco: Never Used  . Alcohol Use: No    Review of Systems  Constitutional: Negative for fever. Cardiovascular: Negative for chest pain. Respiratory: Negative for shortness of breath. Gastrointestinal: Negative for abdominal pain, vomiting and diarrhea. Genitourinary: Negative for dysuria. Musculoskeletal: Negative for back pain. Skin: Negative for rash. Neurological: Positive for headache  10-point ROS otherwise negative.  ____________________________________________   PHYSICAL EXAM:  VITAL SIGNS: ED Triage Vitals  Enc Vitals Group     BP 10/14/15 1610 171/82 mmHg     Pulse Rate 10/14/15 1610 94     Resp 10/14/15 1610 18     Temp 10/14/15 1610 98.3 F (36.8 C)     Temp Source 10/14/15 1610 Oral     SpO2 10/14/15 1610 100 %     Weight 10/14/15 1610 170 lb (77.111 kg)     Height 10/14/15 1610 5\' 6"  (1.676 m)     Head Cir --      Peak Flow --      Pain Score 10/14/15 1611 8   Constitutional: Alert and oriented. Well appearing and in no distress. Eyes: Conjunctivae are normal. PERRL. Normal extraocular movements. ENT   Head: Normocephalic and atraumatic.   Nose: No congestion/rhinnorhea.   Mouth/Throat: Mucous membranes are moist.   Neck: No stridor. Hematological/Lymphatic/Immunilogical: No cervical lymphadenopathy. Cardiovascular: Normal rate, regular rhythm.  No murmurs, rubs, or gallops. Respiratory: Normal respiratory effort without tachypnea nor retractions. Breath sounds are clear and equal bilaterally. No wheezes/rales/rhonchi. Gastrointestinal: Soft and nontender. No distention. There is no CVA tenderness. Genitourinary: Deferred Musculoskeletal: Normal range of motion in all extremities. No joint effusions.  No lower extremity tenderness nor edema. Neurologic:  Normal speech and language. Face symmetric. EOMI. Tongue midline. Symmetric palatal elevation.  Strength 5 out of 5 in upper and lower extremities. Sensation grossly intact. Gait normal. No gross focal neurologic deficits are appreciated.  Skin:  Skin is warm, dry and intact. No rash noted. Psychiatric: Mood and affect are normal. Speech and behavior are normal. Patient exhibits appropriate insight and judgment.  ____________________________________________    LABS (pertinent positives/negatives)  None  ____________________________________________   EKG  I, Nance Pear, attending physician, personally viewed and interpreted this EKG  EKG Time: 1626 Rate: 91 Rhythm: NSR Axis: normal Intervals: qtc 423 QRS: narrow ST changes: no st elevation Impression: normal ekg ____________________________________________    RADIOLOGY  CT head IMPRESSION: Normal head CT.    ____________________________________________  PROCEDURES  Procedure(s) performed: None  Critical Care performed: No  ____________________________________________   INITIAL IMPRESSION / ASSESSMENT AND PLAN / ED COURSE  Pertinent labs & imaging results that were available during my care of the patient were reviewed by me and considered in my medical decision making (see chart for details).  Patient presented to the emergency department today because of concerns for headache and elevated blood pressure. The patient's exam here was benign. I did have some concern for a cranial bleed so a CT scan was performed. This was negative. I discussed with the patient I recommendation to have a lumbar puncture to further evaluate and rule out bleed. Patient at this time declined. I did discuss risk of bleeding including stroke and death. Discussed return precautions with the patient.  ____________________________________________   FINAL CLINICAL IMPRESSION(S) / ED DIAGNOSES  Final diagnoses:  Headache, unspecified headache type  Secondary hypertension, unspecified     Nance Pear, MD 10/14/15  1752

## 2015-10-14 NOTE — ED Notes (Signed)
BP discussed with Dr Archie Balboa.

## 2015-10-16 ENCOUNTER — Encounter: Payer: Self-pay | Admitting: Family Medicine

## 2015-10-16 ENCOUNTER — Ambulatory Visit (INDEPENDENT_AMBULATORY_CARE_PROVIDER_SITE_OTHER): Payer: 59 | Admitting: Family Medicine

## 2015-10-16 VITALS — BP 150/92 | HR 93 | Temp 98.5°F | Resp 16 | Ht 67.0 in | Wt 163.5 lb

## 2015-10-16 DIAGNOSIS — L609 Nail disorder, unspecified: Secondary | ICD-10-CM

## 2015-10-16 DIAGNOSIS — E1159 Type 2 diabetes mellitus with other circulatory complications: Secondary | ICD-10-CM | POA: Diagnosis not present

## 2015-10-16 DIAGNOSIS — M2041 Other hammer toe(s) (acquired), right foot: Secondary | ICD-10-CM | POA: Diagnosis not present

## 2015-10-16 DIAGNOSIS — I10 Essential (primary) hypertension: Secondary | ICD-10-CM

## 2015-10-16 DIAGNOSIS — I2510 Atherosclerotic heart disease of native coronary artery without angina pectoris: Secondary | ICD-10-CM

## 2015-10-16 DIAGNOSIS — L608 Other nail disorders: Secondary | ICD-10-CM | POA: Insufficient documentation

## 2015-10-16 MED ORDER — AMLODIPINE BESYLATE 2.5 MG PO TABS: 2.5000 mg | ORAL_TABLET | Freq: Every day | ORAL | Status: DC

## 2015-10-16 NOTE — Progress Notes (Signed)
Name: Breanna Petty   MRN: [id number]    DOB: 1941-03-15   Date:10/16/2015       Progress Note  Subjective  Chief Complaint  Chief Complaint  Patient presents with  . Follow-up    ER, Went on 10/14/15 due to headache and was dx with headache due to high blood pressure  . Hypertension    still running high today and still has slight headache    HPI  Uncontrolled HTN: she went to [facility] on 11/28 because of severe headache and elevated bp. It was SBP of 190's. She had a normal CT and EKG. She was advised to have a LP but she refused to have it done. She states her bp yesterday was 168/84. She has not taken her bp medication ( Losartan ) this am, and bp is still elevated. She states that headache has resolved. , no neuro deficit, no blurred vision, no chest pain, no nausea.   Patient Active Problem List   Diagnosis Date Noted  . Hammer toe of right foot 10/16/2015  . Nail deformity 10/16/2015  . Chronic obstructive pulmonary disease ([place]) 09/04/2015  . History of MI (myocardial infarction) 09/04/2015  . Spondylolisthesis at L4-L5 level 07/11/2015  . DDD (degenerative disc disease), lumbar 07/11/2015  . History of sarcoidosis 06/04/2015  . Urge incontinence of urine 06/04/2015  . Hearing loss of both ears 05/07/2015  . Anxiety and depression 05/04/2015  . Arteriosclerosis of coronary artery 05/04/2015  . Well controlled diabetes mellitus ([place]) 05/04/2015  . Dyslipidemia 05/04/2015  . Uncontrolled hypertension 05/04/2015  . Gastro-esophageal reflux disease without esophagitis 05/04/2015  . Hiatal hernia 05/04/2015  . HZV (herpes zoster virus) post herpetic neuralgia 05/04/2015  . Vitamin D deficiency 05/04/2015  . S/P coronary artery stent placement 05/04/2015    Past Surgical History  Procedure Laterality Date  . Appendectomy    . Abdominal hysterectomy  1979    Family History  Problem Relation Age of Onset  . Diabetes Daughter     Social History   Social History  .  Marital Status: Single    Spouse Name: N/A  . Number of Children: N/A  . Years of Education: N/A   Occupational History  . Not on file.   Social History Main Topics  . Smoking status: Former Smoker -- 2.00 packs/day for 10 years    Types: Cigarettes    Quit date: 11/16/1988  . Smokeless tobacco: Never Used  . Alcohol Use: No  . Drug Use: No  . Sexual Activity: Not Currently   Other Topics Concern  . Not on file   Social History Narrative     Current outpatient prescriptions:  .  amLODipine (NORVASC) 2.5 MG tablet, Take 1 tablet (2.5 mg total) by mouth daily., Disp: 30 tablet, Rfl: 0 .  aspirin EC 81 MG tablet, Take 1 tablet by mouth daily., Disp: , Rfl:  .  brimonidine (ALPHAGAN P) 0.1 % SOLN, 2 drops Nightly., Disp: , Rfl:  .  Coenzyme Q10 (COQ10) 100 MG CAPS, Take 1 capsule by mouth daily., Disp: , Rfl:  .  latanoprost (XALATAN) 0.005 % ophthalmic solution, 2 drops at bedtime., Disp: , Rfl:  .  losartan (COZAAR) 50 MG tablet, Take 1 tablet (50 mg total) by mouth daily., Disp: 90 tablet, Rfl: 1 .  meloxicam (MOBIC) 15 MG tablet, Take 1 tablet (15 mg total) by mouth daily., Disp: 30 tablet, Rfl: 0 .  nitroGLYCERIN (NITROSTAT) 0.4 MG SL tablet, Place 1 tablet under the  tongue as needed., Disp: , Rfl:  .  nortriptyline (PAMELOR) 10 MG capsule, Take 1 capsule (10 mg total) by mouth at bedtime., Disp: 90 capsule, Rfl: 1 .  omeprazole (PRILOSEC) 20 MG capsule, Take 1 capsule (20 mg total) by mouth 2 (two) times daily., Disp: 180 capsule, Rfl: 1 .  pravastatin (PRAVACHOL) 40 MG tablet, Take 1 tablet (40 mg total) by mouth daily., Disp: 90 tablet, Rfl: 1 .  sitaGLIPtin (JANUVIA) 100 MG tablet, Take 1 tablet (100 mg total) by mouth daily., Disp: 90 tablet, Rfl: 1  Allergies  Allergen Reactions  . Ace Inhibitors     Other reaction(s): Cough  . Codeine Swelling  . Codeine Sulfate     Other reaction(s): Unknown  . Ezetimibe     Other reaction(s): Muscle Pain  . Other     Other  reaction(s): Muscle Pain     ROS  Constitutional: Negative for fever or significant weight change.  Respiratory: Negative for cough and shortness of breath.   Cardiovascular: Negative for chest pain or palpitations.  Gastrointestinal: Negative for abdominal pain, no bowel changes.  Musculoskeletal: Negative for gait problem or joint swelling.  Skin: Negative for rash.  Neurological: Negative for dizziness or headache.  No other specific complaints in a complete review of systems (except as listed in HPI above).  Objective  Filed Vitals:   10/16/15 1115  BP: 150/92  Pulse: 93  Temp: 98.5 F (36.9 C)  TempSrc: Oral  Resp: 16  Height: 5\' 7"  (1.702 m)  Weight: 163 lb 8 oz (74.163 kg)  SpO2: 98%    Body mass index is 25.6 kg/(m^2).  Physical Exam  Constitutional: Patient appears well-developed and well-nourished. No distress.  HEENT: head atraumatic, normocephalic, pupils equal and reactive to light, neck supple, throat within normal limits Cardiovascular: Normal rate, regular rhythm and normal heart sounds.  No murmur heard. No BLE edema. Pulmonary/Chest: Effort normal and breath sounds normal. No respiratory distress. Abdominal: Soft.  There is no tenderness. Psychiatric: Patient has a normal mood and affect. behavior is normal. Judgment and thought content normal. Neuro: cranial nerves intact, Romberg negative, normal gait, normal sensation and strength  Recent Results (from the past 2160 hour(s))  POCT HgB A1C     Status: Abnormal   Collection Time: 09/04/15 11:41 AM  Result Value Ref Range   Hemoglobin A1C 6.2   Lipid panel     Status: None   Collection Time: 09/16/15  9:57 AM  Result Value Ref Range   Cholesterol, Total 179 100 - 199 mg/dL   Triglycerides 42 0 - 149 mg/dL   HDL 97 >39 mg/dL    Comment: According to ATP-III Guidelines, HDL-C >59 mg/dL is considered a negative risk factor for CHD.    VLDL Cholesterol Cal 8 5 - 40 mg/dL   LDL Calculated 74 0 -  99 mg/dL   Chol/HDL Ratio 1.8 0.0 - 4.4 ratio units    Comment:                                   T. Chol/HDL Ratio                                             Men  Women  1/2 Avg.Risk  3.4    3.3                                   Avg.Risk  5.0    4.4                                2X Avg.Risk  9.6    7.1                                3X Avg.Risk 23.4   11.0   Comprehensive metabolic panel     Status: Abnormal   Collection Time: 09/16/15  9:57 AM  Result Value Ref Range   Glucose 116 (H) 65 - 99 mg/dL   BUN 18 8 - 27 mg/dL   Creatinine, Ser 0.81 0.57 - 1.00 mg/dL   GFR calc non Af Amer 72 >59 mL/min/1.73   GFR calc Af Amer 83 >59 mL/min/1.73   BUN/Creatinine Ratio 22 11 - 26   Sodium 139 136 - 144 mmol/L   Potassium 4.6 3.5 - 5.2 mmol/L   Chloride 100 97 - 106 mmol/L   CO2 26 18 - 29 mmol/L   Calcium 9.3 8.7 - 10.3 mg/dL   Total Protein 6.7 6.0 - 8.5 g/dL   Albumin 4.1 3.5 - 4.8 g/dL   Globulin, Total 2.6 1.5 - 4.5 g/dL   Albumin/Globulin Ratio 1.6 1.1 - 2.5   Bilirubin Total 0.4 0.0 - 1.2 mg/dL   Alkaline Phosphatase 75 39 - 117 IU/L   AST 24 0 - 40 IU/L   ALT 18 0 - 32 IU/L  Vit D  25 hydroxy (rtn osteoporosis monitoring)     Status: None   Collection Time: 09/16/15  9:57 AM  Result Value Ref Range   Vit D, 25-Hydroxy 36.3 30.0 - 100.0 ng/mL    Comment: Vitamin D deficiency has been defined by the Institute of Medicine and an Endocrine Society practice guideline as a level of serum 25-OH vitamin D less than 20 ng/mL (1,2). The Endocrine Society went on to further define vitamin D insufficiency as a level between 21 and 29 ng/mL (2). 1. IOM (Institute of Medicine). 2010. Dietary reference    intakes for calcium and D. [place]: The    [employer]. 2. Holick MF, Binkley [place], Bischoff-Ferrari HA, et al.    Evaluation, treatment, and prevention of vitamin D    deficiency: an Endocrine Society clinical practice    guideline.  JCEM. 2011 Jul; 96(7):1911-30.    PHQ2/9: Depression screen [facility] 2/9 09/11/2015 09/04/2015 05/08/2015 05/07/2015  Decreased Interest 0 0 1 1  Down, Depressed, Hopeless 1 0 1 1  PHQ - 2 Score 1 0 2 2  Altered sleeping - - - 2  Tired, decreased energy - - - 1  Change in appetite - - - 1  Feeling bad or failure about yourself  - - - 1  Trouble concentrating - - - 2  Moving slowly or fidgety/restless - - - 1  Suicidal thoughts - - - 0  PHQ-9 Score - - - 10  Difficult doing work/chores - - - Somewhat difficult     Fall Risk: Fall Risk  09/11/2015 09/04/2015 05/08/2015 05/07/2015  Falls in the past year? - Yes Yes No  Number falls  in past yr: - 1 1 -  Injury with Fall? - No No -  Risk for fall due to : History of fall(s) - History of fall(s) -   Diabetic Foot Exam - Simple   Simple Foot Form  Diabetic Foot exam was performed with the following findings:  Yes 10/16/2015 11:51 AM  Visual Inspection  See comments:  Yes  Sensation Testing  Intact to touch and monofilament testing bilaterally:  Yes  Pulse Check  Posterior Tibialis and Dorsalis pulse intact bilaterally:  Yes  Comments  Hammer toes and nail deformity , also callus formation on the bottom of right foot     Assessment & Plan  1. Uncontrolled hypertension  - amLODipine (NORVASC) 2.5 MG tablet; Take 1 tablet (2.5 mg total) by mouth daily.  Dispense: 30 tablet; Refill: 0  No longer has a headache, she still has losartan at home, advised to take it daily as prescribed, add Norvasc low dose and return in 2 weeks with bp log to see if medication needs to be adjusted  2. Hammer toe of right foot   3. Nail deformity   4. Type 2 diabetes mellitus with other circulatory complications (HCC)  Diabetic shoe order given to patient

## 2015-10-16 NOTE — Patient Instructions (Signed)
DASH Eating Plan  DASH stands for "Dietary Approaches to Stop Hypertension." The DASH eating plan is a healthy eating plan that has been shown to reduce high blood pressure (hypertension). Additional health benefits may include reducing the risk of type 2 diabetes mellitus, heart disease, and stroke. The DASH eating plan may also help with weight loss.  WHAT DO I NEED TO KNOW ABOUT THE DASH EATING PLAN?  For the DASH eating plan, you will follow these general guidelines:  · Choose foods with a percent daily value for sodium of less than 5% (as listed on the food label).  · Use salt-free seasonings or herbs instead of table salt or sea salt.  · Check with your health care provider or pharmacist before using salt substitutes.  · Eat lower-sodium products, often labeled as "lower sodium" or "no salt added."  · Eat fresh foods.  · Eat more vegetables, fruits, and low-fat dairy products.  · Choose whole grains. Look for the word "whole" as the first word in the ingredient list.  · Choose fish and skinless chicken or turkey more often than red meat. Limit fish, poultry, and meat to 6 oz (170 g) each day.  · Limit sweets, desserts, sugars, and sugary drinks.  · Choose heart-healthy fats.  · Limit cheese to 1 oz (28 g) per day.  · Eat more home-cooked food and less restaurant, buffet, and fast food.  · Limit fried foods.  · Cook foods using methods other than frying.  · Limit canned vegetables. If you do use them, rinse them well to decrease the sodium.  · When eating at a restaurant, ask that your food be prepared with less salt, or no salt if possible.  WHAT FOODS CAN I EAT?  Seek help from a dietitian for individual calorie needs.  Grains  Whole grain or whole wheat bread. Brown rice. Whole grain or whole wheat pasta. Quinoa, bulgur, and whole grain cereals. Low-sodium cereals. Corn or whole wheat flour tortillas. Whole grain cornbread. Whole grain crackers. Low-sodium crackers.  Vegetables  Fresh or frozen vegetables  (raw, steamed, roasted, or grilled). Low-sodium or reduced-sodium tomato and vegetable juices. Low-sodium or reduced-sodium tomato sauce and paste. Low-sodium or reduced-sodium canned vegetables.   Fruits  All fresh, canned (in natural juice), or frozen fruits.  Meat and Other Protein Products  Ground beef (85% or leaner), grass-fed beef, or beef trimmed of fat. Skinless chicken or turkey. Ground chicken or turkey. Pork trimmed of fat. All fish and seafood. Eggs. Dried beans, peas, or lentils. Unsalted nuts and seeds. Unsalted canned beans.  Dairy  Low-fat dairy products, such as skim or 1% milk, 2% or reduced-fat cheeses, low-fat ricotta or cottage cheese, or plain low-fat yogurt. Low-sodium or reduced-sodium cheeses.  Fats and Oils  Tub margarines without trans fats. Light or reduced-fat mayonnaise and salad dressings (reduced sodium). Avocado. Safflower, olive, or canola oils. Natural peanut or almond butter.  Other  Unsalted popcorn and pretzels.  The items listed above may not be a complete list of recommended foods or beverages. Contact your dietitian for more options.  WHAT FOODS ARE NOT RECOMMENDED?  Grains  White bread. White pasta. White rice. Refined cornbread. Bagels and croissants. Crackers that contain trans fat.  Vegetables  Creamed or fried vegetables. Vegetables in a cheese sauce. Regular canned vegetables. Regular canned tomato sauce and paste. Regular tomato and vegetable juices.  Fruits  Dried fruits. Canned fruit in light or heavy syrup. Fruit juice.  Meat and Other Protein   Products  Fatty cuts of meat. Ribs, chicken wings, bacon, sausage, bologna, salami, chitterlings, fatback, hot dogs, bratwurst, and packaged luncheon meats. Salted nuts and seeds. Canned beans with salt.  Dairy  Whole or 2% milk, cream, half-and-half, and cream cheese. Whole-fat or sweetened yogurt. Full-fat cheeses or blue cheese. Nondairy creamers and whipped toppings. Processed cheese, cheese spreads, or cheese  curds.  Condiments  Onion and garlic salt, seasoned salt, table salt, and sea salt. Canned and packaged gravies. Worcestershire sauce. Tartar sauce. Barbecue sauce. Teriyaki sauce. Soy sauce, including reduced sodium. Steak sauce. Fish sauce. Oyster sauce. Cocktail sauce. Horseradish. Ketchup and mustard. Meat flavorings and tenderizers. Bouillon cubes. Hot sauce. Tabasco sauce. Marinades. Taco seasonings. Relishes.  Fats and Oils  Butter, stick margarine, lard, shortening, ghee, and bacon fat. Coconut, palm kernel, or palm oils. Regular salad dressings.  Other  Pickles and olives. Salted popcorn and pretzels.  The items listed above may not be a complete list of foods and beverages to avoid. Contact your dietitian for more information.  WHERE CAN I FIND MORE INFORMATION?  National Heart, Lung, and Blood Institute: www.nhlbi.nih.gov/health/health-topics/topics/dash/     This information is not intended to replace advice given to you by your health care provider. Make sure you discuss any questions you have with your health care provider.     Document Released: 10/22/2011 Document Revised: 11/23/2014 Document Reviewed: 09/06/2013  Elsevier Interactive Patient Education ©2016 [employer]

## 2015-10-29 ENCOUNTER — Other Ambulatory Visit: Payer: Self-pay | Admitting: Family Medicine

## 2015-10-29 DIAGNOSIS — Z1231 Encounter for screening mammogram for malignant neoplasm of breast: Secondary | ICD-10-CM

## 2015-10-30 ENCOUNTER — Other Ambulatory Visit: Payer: Self-pay | Admitting: Family Medicine

## 2015-10-30 ENCOUNTER — Ambulatory Visit (INDEPENDENT_AMBULATORY_CARE_PROVIDER_SITE_OTHER): Payer: 59 | Admitting: Family Medicine

## 2015-10-30 ENCOUNTER — Encounter: Payer: Self-pay | Admitting: Family Medicine

## 2015-10-30 ENCOUNTER — Ambulatory Visit
Admission: RE | Admit: 2015-10-30 | Discharge: 2015-10-30 | Disposition: A | Discharge status: Home / Self Care | Payer: 59 | Source: Ambulatory Visit | Attending: Family Medicine | Admitting: Family Medicine

## 2015-10-30 VITALS — BP 148/76 | HR 94 | Temp 98.5°F | Resp 18 | Ht 67.0 in | Wt 160.5 lb

## 2015-10-30 DIAGNOSIS — Z1231 Encounter for screening mammogram for malignant neoplasm of breast: Secondary | ICD-10-CM | POA: Insufficient documentation

## 2015-10-30 DIAGNOSIS — I10 Essential (primary) hypertension: Secondary | ICD-10-CM | POA: Diagnosis not present

## 2015-10-30 DIAGNOSIS — I2510 Atherosclerotic heart disease of native coronary artery without angina pectoris: Secondary | ICD-10-CM

## 2015-10-30 MED ORDER — AMLODIPINE BESYLATE 5 MG PO TABS: 5.0000 mg | ORAL_TABLET | Freq: Every day | ORAL | Status: DC

## 2015-10-30 NOTE — Progress Notes (Signed)
Name: Breanna Petty   MRN: [id number]    DOB: Aug 12, 1941   Date:10/30/2015       Progress Note  Subjective  Chief Complaint  Chief Complaint  Patient presents with  . Hospitalization Follow-up  . Hypertension    blood pressure avg:170's/90's still running high since starting norvasc    HPI  Uncontrolled HTN: she went to [facility] on 11/28 because of severe headache and elevated bp. It was SBP of 190's. She had a normal CT and EKG.She is taking Losartan 50 mg and also Norvasc 2.5 mg ( started two weeks ago) she brought a log from the first week of December and bp still 160's/170's. She denies any side effects of new medication. Denies headaches,  no neuro deficit, no blurred vision, no chest pain, no nausea.    Patient Active Problem List   Diagnosis Date Noted  . Hammer toe of right foot 10/16/2015  . Nail deformity 10/16/2015  . Chronic obstructive pulmonary disease ([place]) 09/04/2015  . History of MI (myocardial infarction) 09/04/2015  . Spondylolisthesis at L4-L5 level 07/11/2015  . DDD (degenerative disc disease), lumbar 07/11/2015  . History of sarcoidosis 06/04/2015  . Urge incontinence of urine 06/04/2015  . Hearing loss of both ears 05/07/2015  . Anxiety and depression 05/04/2015  . Arteriosclerosis of coronary artery 05/04/2015  . Well controlled diabetes mellitus ([place]) 05/04/2015  . Dyslipidemia 05/04/2015  . Uncontrolled hypertension 05/04/2015  . Gastro-esophageal reflux disease without esophagitis 05/04/2015  . Hiatal hernia 05/04/2015  . HZV (herpes zoster virus) post herpetic neuralgia 05/04/2015  . Vitamin D deficiency 05/04/2015  . S/P coronary artery stent placement 05/04/2015     Past Surgical History  Procedure Laterality Date  . Appendectomy    . Abdominal hysterectomy  1979    Family History  Problem Relation Age of Onset  . Diabetes Daughter     Social History   Social History  . Marital Status: Single    Spouse Name: N/A  . Number of  Children: N/A  . Years of Education: N/A   Occupational History  . Not on file.   Social History Main Topics  . Smoking status: Former Smoker -- 2.00 packs/day for 10 years    Types: Cigarettes    Quit date: 11/16/1988  . Smokeless tobacco: Never Used  . Alcohol Use: No  . Drug Use: No  . Sexual Activity: Not Currently   Other Topics Concern  . Not on file   Social History Narrative     Current outpatient prescriptions:  .  amLODipine (NORVASC) 5 MG tablet, Take 1 tablet (5 mg total) by mouth daily., Disp: 30 tablet, Rfl: 0 .  aspirin EC 81 MG tablet, Take 1 tablet by mouth daily., Disp: , Rfl:  .  brimonidine (ALPHAGAN P) 0.1 % SOLN, 2 drops Nightly., Disp: , Rfl:  .  latanoprost (XALATAN) 0.005 % ophthalmic solution, 2 drops at bedtime., Disp: , Rfl:  .  losartan (COZAAR) 50 MG tablet, Take 1 tablet (50 mg total) by mouth daily., Disp: 90 tablet, Rfl: 1 .  nitroGLYCERIN (NITROSTAT) 0.4 MG SL tablet, Place 1 tablet under the tongue as needed., Disp: , Rfl:  .  nortriptyline (PAMELOR) 10 MG capsule, Take 1 capsule (10 mg total) by mouth at bedtime., Disp: 90 capsule, Rfl: 1 .  omeprazole (PRILOSEC) 20 MG capsule, Take 1 capsule (20 mg total) by mouth 2 (two) times daily., Disp: 180 capsule, Rfl: 1 .  pravastatin (PRAVACHOL) 40 MG tablet, Take  1 tablet (40 mg total) by mouth daily., Disp: 90 tablet, Rfl: 1 .  sitaGLIPtin (JANUVIA) 100 MG tablet, Take 1 tablet (100 mg total) by mouth daily., Disp: 90 tablet, Rfl: 1  Allergies  Allergen Reactions  . Ace Inhibitors     Other reaction(s): Cough  . Codeine Swelling  . Codeine Sulfate     Other reaction(s): Unknown  . Ezetimibe     Other reaction(s): Muscle Pain  . Other     Other reaction(s): Muscle Pain     ROS  Ten systems reviewed and is negative except as mentioned in HPI   Objective  Filed Vitals:   10/30/15 1108  BP: 148/76  Pulse: 94  Temp: 98.5 F (36.9 C)  TempSrc: Oral  Resp: 18  Height: 5\' 7"  (1.702  m)  Weight: 160 lb 8 oz (72.802 kg)  SpO2: 98%    Body mass index is 25.13 kg/(m^2).  Physical Exam  Constitutional: Patient appears well-developed and well-nourished. Obese No distress.  HEENT: head atraumatic, normocephalic, pupils equal and reactive to light, neck supple, throat within normal limits Cardiovascular: Normal rate, regular rhythm and normal heart sounds.  No murmur heard. No BLE edema. Pulmonary/Chest: Effort normal and breath sounds normal. No respiratory distress. Abdominal: Soft.  There is no tenderness. Psychiatric: Patient has a normal mood and affect. behavior is normal. Judgment and thought content normal. Neuro: normal cranial nerves, romberg negative  Recent Results (from the past 2160 hour(s))  POCT HgB A1C     Status: Abnormal   Collection Time: 09/04/15 11:41 AM  Result Value Ref Range   Hemoglobin A1C 6.2   Lipid panel     Status: None   Collection Time: 09/16/15  9:57 AM  Result Value Ref Range   Cholesterol, Total 179 100 - 199 mg/dL   Triglycerides 42 0 - 149 mg/dL   HDL 97 >39 mg/dL    Comment: According to ATP-III Guidelines, HDL-C >59 mg/dL is considered a negative risk factor for CHD.    VLDL Cholesterol Cal 8 5 - 40 mg/dL   LDL Calculated 74 0 - 99 mg/dL   Chol/HDL Ratio 1.8 0.0 - 4.4 ratio units    Comment:                                   T. Chol/HDL Ratio                                             Men  Women                               1/2 Avg.Risk  3.4    3.3                                   Avg.Risk  5.0    4.4                                2X Avg.Risk  9.6    7.1  3X Avg.Risk 23.4   11.0   Comprehensive metabolic panel     Status: Abnormal   Collection Time: 09/16/15  9:57 AM  Result Value Ref Range   Glucose 116 (H) 65 - 99 mg/dL   BUN 18 8 - 27 mg/dL   Creatinine, Ser 0.81 0.57 - 1.00 mg/dL   GFR calc non Af Amer 72 >59 mL/min/1.73   GFR calc Af Amer 83 >59 mL/min/1.73   BUN/Creatinine  Ratio 22 11 - 26   Sodium 139 136 - 144 mmol/L   Potassium 4.6 3.5 - 5.2 mmol/L   Chloride 100 97 - 106 mmol/L   CO2 26 18 - 29 mmol/L   Calcium 9.3 8.7 - 10.3 mg/dL   Total Protein 6.7 6.0 - 8.5 g/dL   Albumin 4.1 3.5 - 4.8 g/dL   Globulin, Total 2.6 1.5 - 4.5 g/dL   Albumin/Globulin Ratio 1.6 1.1 - 2.5   Bilirubin Total 0.4 0.0 - 1.2 mg/dL   Alkaline Phosphatase 75 39 - 117 IU/L   AST 24 0 - 40 IU/L   ALT 18 0 - 32 IU/L  Vit D  25 hydroxy (rtn osteoporosis monitoring)     Status: None   Collection Time: 09/16/15  9:57 AM  Result Value Ref Range   Vit D, 25-Hydroxy 36.3 30.0 - 100.0 ng/mL    Comment: Vitamin D deficiency has been defined by the Institute of Medicine and an Endocrine Society practice guideline as a level of serum 25-OH vitamin D less than 20 ng/mL (1,2). The Endocrine Society went on to further define vitamin D insufficiency as a level between 21 and 29 ng/mL (2). 1. IOM (Institute of Medicine). 2010. Dietary reference    intakes for calcium and D. [place]: The    [employer]. 2. Holick MF, Binkley , Bischoff-Ferrari HA, et al.    Evaluation, treatment, and prevention of vitamin D    deficiency: an Endocrine Society clinical practice    guideline. JCEM. 2011 Jul; 96(7):1911-30.       PHQ2/9: Depression screen [facility] 2/9 09/11/2015 09/04/2015 05/08/2015 05/07/2015  Decreased Interest 0 0 1 1  Down, Depressed, Hopeless 1 0 1 1  PHQ - 2 Score 1 0 2 2  Altered sleeping - - - 2  Tired, decreased energy - - - 1  Change in appetite - - - 1  Feeling bad or failure about yourself  - - - 1  Trouble concentrating - - - 2  Moving slowly or fidgety/restless - - - 1  Suicidal thoughts - - - 0  PHQ-9 Score - - - 10  Difficult doing work/chores - - - Somewhat difficult     Fall Risk: Fall Risk  09/11/2015 09/04/2015 05/08/2015 05/07/2015  Falls in the past year? - Yes Yes No  Number falls in past yr: - 1 1 -  Injury with Fall? - No No -  Risk  for fall due to : History of fall(s) - History of fall(s) -     Assessment & Plan  1. Uncontrolled hypertension  She was started by Dr. Sabra Heck on Meloxicam in the beginning of November, shoulder is feeling better, advised to stop Meloxicam since it may be causing elevation of her bp. Increase dose of Norvasc to 5 mg and monitor bp. Discussed orthostatic changes and to call me if she has symptoms.   - amLODipine (NORVASC) 5 MG tablet; Take 1 tablet (5 mg total) by mouth daily.  Dispense: 30 tablet; Refill:  0   

## 2015-11-20 ENCOUNTER — Ambulatory Visit: Payer: 59 | Admitting: Family Medicine

## 2015-11-22 ENCOUNTER — Ambulatory Visit (INDEPENDENT_AMBULATORY_CARE_PROVIDER_SITE_OTHER): Payer: 59 | Admitting: Family Medicine

## 2015-11-22 ENCOUNTER — Encounter: Payer: Self-pay | Admitting: Family Medicine

## 2015-11-22 VITALS — BP 132/82 | HR 115 | Temp 98.2°F | Resp 16 | Ht 67.0 in | Wt 162.0 lb

## 2015-11-22 DIAGNOSIS — R61 Generalized hyperhidrosis: Secondary | ICD-10-CM | POA: Insufficient documentation

## 2015-11-22 DIAGNOSIS — R05 Cough: Secondary | ICD-10-CM | POA: Diagnosis not present

## 2015-11-22 DIAGNOSIS — J069 Acute upper respiratory infection, unspecified: Secondary | ICD-10-CM | POA: Insufficient documentation

## 2015-11-22 DIAGNOSIS — R059 Cough, unspecified: Secondary | ICD-10-CM | POA: Insufficient documentation

## 2015-11-22 NOTE — Patient Instructions (Addendum)
1) Blood work at [facility]) Chest X-ray at [facility] on [address] across [address] from [facility] parking lot.  Upper Respiratory Infection, Adult Most upper respiratory infections (URIs) are a viral infection of the air passages leading to the lungs. A URI affects the nose, throat, and upper air passages. The most common type of URI is nasopharyngitis and is typically referred to as "the common cold." URIs run their course and usually go away on their own. Most of the time, a URI does not require medical attention, but sometimes a bacterial infection in the upper airways can follow a viral infection. This is called a secondary infection. Sinus and middle ear infections are common types of secondary upper respiratory infections. Bacterial pneumonia can also complicate a URI. A URI can worsen asthma and chronic obstructive pulmonary disease (COPD). Sometimes, these complications can require emergency medical care and may be life threatening.  CAUSES Almost all URIs are caused by viruses. A virus is a type of germ and can spread from one person to another.  RISKS FACTORS You may be at risk for a URI if:   You smoke.   You have chronic heart or lung disease.  You have a weakened defense (immune) system.   You are very young or very old.   You have nasal allergies or asthma.  You work in crowded or poorly ventilated areas.  You work in health care facilities or schools. SIGNS AND SYMPTOMS  Symptoms typically develop 2-3 days after you come in contact with a cold virus. Most viral URIs last 7-10 days. However, viral URIs from the influenza virus (flu virus) can last 14-18 days and are typically more severe. Symptoms may include:   Runny or stuffy (congested) nose.   Sneezing.   Cough.   Sore throat.   Headache.   Fatigue.   Fever.   Loss of appetite.   Pain in your forehead, behind your eyes, and over your cheekbones (sinus  pain).  Muscle aches.  DIAGNOSIS  Your health care provider may diagnose a URI by:  Physical exam.  Tests to check that your symptoms are not due to another condition such as:  Strep throat.  Sinusitis.  Pneumonia.  Asthma. TREATMENT  A URI goes away on its own with time. It cannot be cured with medicines, but medicines may be prescribed or recommended to relieve symptoms. Medicines may help:  Reduce your fever.  Reduce your cough.  Relieve nasal congestion. HOME CARE INSTRUCTIONS   Take medicines only as directed by your health care provider.   Gargle warm saltwater or take cough drops to comfort your throat as directed by your health care provider.  Use a warm mist humidifier or inhale steam from a shower to increase air moisture. This may make it easier to breathe.  Drink enough fluid to keep your urine clear or pale yellow.   Eat soups and other clear broths and maintain good nutrition.   Rest as needed.   Return to work when your temperature has returned to normal or as your health care provider advises. You may need to stay home longer to avoid infecting others. You can also use a face mask and careful hand washing to prevent spread of the virus.  Increase the usage of your inhaler if you have asthma.   Do not use any tobacco products, including cigarettes, chewing tobacco, or electronic cigarettes. If you need help quitting, ask your health care provider. PREVENTION  The  best way to protect yourself from getting a cold is to practice good hygiene.   Avoid oral or hand contact with people with cold symptoms.   Wash your hands often if contact occurs.  There is no clear evidence that vitamin C, vitamin E, echinacea, or exercise reduces the chance of developing a cold. However, it is always recommended to get plenty of rest, exercise, and practice good nutrition.  SEEK MEDICAL CARE IF:   You are getting worse rather than better.   Your symptoms are  not controlled by medicine.   You have chills.  You have worsening shortness of breath.  You have brown or red mucus.  You have yellow or brown nasal discharge.  You have pain in your face, especially when you bend forward.  You have a fever.  You have swollen neck glands.  You have pain while swallowing.  You have white areas in the back of your throat. SEEK IMMEDIATE MEDICAL CARE IF:   You have severe or persistent:  Headache.  Ear pain.  Sinus pain.  Chest pain.  You have chronic lung disease and any of the following:  Wheezing.  Prolonged cough.  Coughing up blood.  A change in your usual mucus.  You have a stiff neck.  You have changes in your:  Vision.  Hearing.  Thinking.  Mood. MAKE SURE YOU:   Understand these instructions.  Will watch your condition.  Will get help right away if you are not doing well or get worse.   This information is not intended to replace advice given to you by your health care provider. Make sure you discuss any questions you have with your health care provider.   Document Released: 04/28/2001 Document Revised: 03/19/2015 Document Reviewed: 02/07/2014 Elsevier Interactive Patient Education [employer].

## 2015-11-22 NOTE — Progress Notes (Signed)
Name: Breanna Petty   MRN: [id number]    DOB: Feb 25, 1941   Date:11/22/2015       Progress Note  Subjective  Chief Complaint  Chief Complaint  Patient presents with  . URI    HPI  Patient is here today with concerns regarding the following symptoms sore throat, congestion and achiness that started months ago. No unexpected weight loss. No coughing up sputum with blood. No recent foreign travel.  Associated with sweats. Not associated with chest pain.    Past Medical History  Diagnosis Date  . Hypertension   . Depression   . Anxiety   . Hyperlipidemia   . Diabetes mellitus without complication ([place])   . Vitamin D deficiency   . Atherosclerotic heart disease   . GERD (gastroesophageal reflux disease)   . History of shingles   . History of heart artery stent   . Hiatal hernia   . Sarcoidosis [facility])     Social History  Substance Use Topics  . Smoking status: Former Smoker -- 2.00 packs/day for 10 years    Types: Cigarettes    Quit date: 11/16/1988  . Smokeless tobacco: Never Used  . Alcohol Use: No     Current outpatient prescriptions:  .  amLODipine (NORVASC) 5 MG tablet, Take 1 tablet (5 mg total) by mouth daily., Disp: 30 tablet, Rfl: 0 .  aspirin EC 81 MG tablet, Take 1 tablet by mouth daily., Disp: , Rfl:  .  brimonidine (ALPHAGAN P) 0.1 % SOLN, 2 drops Nightly., Disp: , Rfl:  .  latanoprost (XALATAN) 0.005 % ophthalmic solution, 2 drops at bedtime., Disp: , Rfl:  .  losartan (COZAAR) 50 MG tablet, Take 1 tablet (50 mg total) by mouth daily., Disp: 90 tablet, Rfl: 1 .  nitroGLYCERIN (NITROSTAT) 0.4 MG SL tablet, Place 1 tablet under the tongue as needed., Disp: , Rfl:  .  nortriptyline (PAMELOR) 10 MG capsule, Take 1 capsule (10 mg total) by mouth at bedtime., Disp: 90 capsule, Rfl: 1 .  omeprazole (PRILOSEC) 20 MG capsule, Take 1 capsule (20 mg total) by mouth 2 (two) times daily., Disp: 180 capsule, Rfl: 1 .  pravastatin (PRAVACHOL) 40 MG tablet, Take 1 tablet (40  mg total) by mouth daily., Disp: 90 tablet, Rfl: 1 .  sitaGLIPtin (JANUVIA) 100 MG tablet, Take 1 tablet (100 mg total) by mouth daily., Disp: 90 tablet, Rfl: 1  Allergies  Allergen Reactions  . Ace Inhibitors     Other reaction(s): Cough  . Codeine Swelling  . Codeine Sulfate     Other reaction(s): Unknown  . Ezetimibe     Other reaction(s): Muscle Pain  . Other     Other reaction(s): Muscle Pain    ROS  Positive for fatigue, nasal congestion, sinus pressure, ear fullness, cough as mentioned in HPI, otherwise all systems reviewed and are negative.  Objective  Filed Vitals:   11/22/15 1519  BP: 132/82  Pulse: 115  Temp: 98.2 F (36.8 C)  TempSrc: Oral  Resp: 16  Height: 5\' 7"  (1.702 m)  Weight: 162 lb (73.483 kg)  SpO2: 99%   Body mass index is 25.37 kg/(m^2).   Physical Exam  Constitutional: Patient appears well-developed and well-nourished. In no acute distress. HEENT:  - Head: Normocephalic and atraumatic.  - Ears: RIGHT and LEFT TM pearly gray with no tension or fluid.  - Nose: Nasal mucosa boggy but not congested.  - Mouth/Throat: Oropharynx is moist without erythema of bilateral tonsils without hypertrophy or  exudates. Post nasal drainage present.  - Eyes: Conjunctivae clear, EOM movements normal. PERRLA. No scleral icterus.  Neck: Normal range of motion. Neck supple. No JVD present. No thyromegaly present. No local lymphadenopathy. Cardiovascular: Regular rate, regular rhythm with no murmurs heard.  Pulmonary/Chest: Effort normal and breath sounds clear in all lung fields.  Psychiatric: Patient has a normal mood and affect. Behavior is normal in office today. Judgment and thought content normal in office today.   Assessment & Plan   1. Cough Etiology unclear, will rule out Tuberculosis.  - Quantiferon tb gold assay - DG Chest 2 View; Future  2. Unexplained night sweats Sweats happen day or night for months. Sound like hot flashes but could also be  related to her Sarcoidosis.   - Quantiferon tb gold assay - DG Chest 2 View; Future  3. Upper respiratory infection Likely viral prodrome. Instructed patient on increasing hydration, nasal saline spray, steam inhalation, NSAID if tolerated and not contraindicated. If not already doing so start taking daily anti-histamine and use a steroid nasal spray.   - Quantiferon tb gold assay - DG Chest 2 View; Future

## 2015-11-28 ENCOUNTER — Other Ambulatory Visit: Payer: Self-pay | Admitting: Family Medicine

## 2015-12-03 DIAGNOSIS — R61 Generalized hyperhidrosis: Secondary | ICD-10-CM | POA: Diagnosis not present

## 2015-12-03 DIAGNOSIS — R05 Cough: Secondary | ICD-10-CM | POA: Diagnosis not present

## 2015-12-03 DIAGNOSIS — J069 Acute upper respiratory infection, unspecified: Secondary | ICD-10-CM | POA: Diagnosis not present

## 2015-12-04 ENCOUNTER — Ambulatory Visit
Admission: RE | Admit: 2015-12-04 | Discharge: 2015-12-04 | Disposition: A | Discharge status: Home / Self Care | Payer: 59 | Source: Ambulatory Visit | Attending: Family Medicine | Admitting: Family Medicine

## 2015-12-04 DIAGNOSIS — J069 Acute upper respiratory infection, unspecified: Secondary | ICD-10-CM

## 2015-12-04 DIAGNOSIS — R61 Generalized hyperhidrosis: Secondary | ICD-10-CM

## 2015-12-04 DIAGNOSIS — R05 Cough: Secondary | ICD-10-CM | POA: Diagnosis not present

## 2015-12-04 DIAGNOSIS — R059 Cough, unspecified: Secondary | ICD-10-CM

## 2015-12-05 LAB — QUANTIFERON IN TUBE
QFT TB AG MINUS NIL VALUE: 0.01 IU/mL
QUANTIFERON MITOGEN VALUE: 3.95 IU/mL
QUANTIFERON TB AG VALUE: 0.08 [IU]/mL
QUANTIFERON TB GOLD: NEGATIVE
Quantiferon Nil Value: 0.07 IU/mL

## 2015-12-05 LAB — QUANTIFERON TB GOLD ASSAY (BLOOD)

## 2015-12-09 ENCOUNTER — Encounter: Payer: Self-pay | Admitting: Family Medicine

## 2015-12-09 ENCOUNTER — Ambulatory Visit (INDEPENDENT_AMBULATORY_CARE_PROVIDER_SITE_OTHER): Payer: 59 | Admitting: Family Medicine

## 2015-12-09 VITALS — BP 124/58 | HR 108 | Temp 98.3°F | Resp 16 | Wt 162.3 lb

## 2015-12-09 DIAGNOSIS — B0229 Other postherpetic nervous system involvement: Secondary | ICD-10-CM | POA: Diagnosis not present

## 2015-12-09 DIAGNOSIS — I2510 Atherosclerotic heart disease of native coronary artery without angina pectoris: Secondary | ICD-10-CM | POA: Diagnosis not present

## 2015-12-09 DIAGNOSIS — E785 Hyperlipidemia, unspecified: Secondary | ICD-10-CM | POA: Diagnosis not present

## 2015-12-09 DIAGNOSIS — E1165 Type 2 diabetes mellitus with hyperglycemia: Secondary | ICD-10-CM | POA: Diagnosis not present

## 2015-12-09 DIAGNOSIS — N3941 Urge incontinence: Secondary | ICD-10-CM | POA: Diagnosis not present

## 2015-12-09 DIAGNOSIS — I10 Essential (primary) hypertension: Secondary | ICD-10-CM

## 2015-12-09 LAB — POCT UA - MICROALBUMIN: Microalbumin Ur, POC: 20 mg/L

## 2015-12-09 LAB — POCT GLYCOSYLATED HEMOGLOBIN (HGB A1C): HEMOGLOBIN A1C: 7.5

## 2015-12-09 MED ORDER — NORTRIPTYLINE HCL 10 MG PO CAPS: 10.0000 mg | ORAL_CAPSULE | Freq: Every day | ORAL | Status: DC

## 2015-12-09 MED ORDER — PRAVASTATIN SODIUM 40 MG PO TABS: 40.0000 mg | ORAL_TABLET | Freq: Every day | ORAL | Status: DC

## 2015-12-09 MED ORDER — AMLODIPINE BESYLATE 5 MG PO TABS: 5.0000 mg | ORAL_TABLET | Freq: Every day | ORAL | Status: DC

## 2015-12-09 MED ORDER — SITAGLIPTIN PHOSPHATE 100 MG PO TABS: 100.0000 mg | ORAL_TABLET | Freq: Every day | ORAL | Status: DC

## 2015-12-09 MED ORDER — OMEPRAZOLE 20 MG PO CPDR: 20.0000 mg | DELAYED_RELEASE_CAPSULE | Freq: Two times a day (BID) | ORAL | Status: DC

## 2015-12-09 MED ORDER — LOSARTAN POTASSIUM 50 MG PO TABS: 50.0000 mg | ORAL_TABLET | Freq: Every day | ORAL | Status: DC

## 2015-12-09 NOTE — Progress Notes (Signed)
Name: Breanna Petty   MRN: [id number]    DOB: [birth year]   Date:12/09/2015       Progress Note  Subjective  Chief Complaint  Chief Complaint  Patient presents with  . Diabetes    patient is here for a 47-month f/u. patient checks her blood sugar everyday. this morning it was 123. highest was 192 & the lowest was 89.  Marland Kitchen Hypertension    patient has not had any sx  . Gastroesophageal Reflux    patient stated that she has been having a lot of reflux    HPI  DMII : she checks fsbs at home is around 120's. It has gone up to 190's . She denies polyphagia but has occasional polydipsia, she also  has urinary frequency. She has been compliant with Januvia and denies side effects of medication. She has CAD and is status post stent placement, no chest pain, and is taking aspirin daily   HTN: she is on Losartan and denies side effects, bp is at goal. She denies chest pain, cough,  palpitation or SOB. Compliant with medication  Hyperlipidemia and CAD: she is taking aspirin, statin and ARB. She is doing well, last labs were done in 08/2015 and was at goal  GERD: she is taking Omeprazole twice daily, she still wakes up with a bitter taste in her mouth, she stop taking cinnamon but is still eating fresh garlic but not every day now. She still has symptoms. Explained that garlic causes reflux to get worse. Discussed referral to GI, but she wants to hold off at this time  Post-herpetic neuralgia: doing much better since started on Nortriptyline , symptoms are stable.    Patient Active Problem List   Diagnosis Date Noted  . Hammer toe of right foot 10/16/2015  . Nail deformity 10/16/2015  . Chronic obstructive pulmonary disease ([place]) 09/04/2015  . History of MI (myocardial infarction) 09/04/2015  . Spondylolisthesis at L4-L5 level 07/11/2015  . DDD (degenerative disc disease), lumbar 07/11/2015  . History of sarcoidosis 06/04/2015  . Urge incontinence of urine 06/04/2015  . Hearing loss of both  ears 05/07/2015  . Anxiety and depression 05/04/2015  . Arteriosclerosis of coronary artery 05/04/2015  . Type 2 diabetes mellitus, uncontrolled ([place]) 05/04/2015  . Dyslipidemia 05/04/2015  . Hypertension, benign 05/04/2015  . Gastro-esophageal reflux disease without esophagitis 05/04/2015  . Hiatal hernia 05/04/2015  . HZV (herpes zoster virus) post herpetic neuralgia 05/04/2015  . Vitamin D deficiency 05/04/2015  . S/P coronary artery stent placement 05/04/2015    Past Surgical History  Procedure Laterality Date  . Appendectomy    . Abdominal hysterectomy  1979    Family History  Problem Relation Age of Onset  . Diabetes Daughter     Social History   Social History  . Marital Status: Single    Spouse Name: N/A  . Number of Children: N/A  . Years of Education: N/A   Occupational History  . Not on file.   Social History Main Topics  . Smoking status: Former Smoker -- 2.00 packs/day for 10 years    Types: Cigarettes    Quit date: 11/16/1988  . Smokeless tobacco: Never Used  . Alcohol Use: No  . Drug Use: No  . Sexual Activity: Not Currently   Other Topics Concern  . Not on file   Social History Narrative     Current outpatient prescriptions:  .  amLODipine (NORVASC) 5 MG tablet, Take 1 tablet (5 mg total) by mouth  daily., Disp: 90 tablet, Rfl: 1 .  aspirin EC 81 MG tablet, Take 1 tablet by mouth daily., Disp: , Rfl:  .  brimonidine (ALPHAGAN P) 0.1 % SOLN, 2 drops Nightly., Disp: , Rfl:  .  latanoprost (XALATAN) 0.005 % ophthalmic solution, 2 drops at bedtime., Disp: , Rfl:  .  losartan (COZAAR) 50 MG tablet, Take 1 tablet (50 mg total) by mouth daily., Disp: 90 tablet, Rfl: 1 .  NITROSTAT 0.4 MG SL tablet, DISSOLVE ONE TABLET UNDER TONGUE AS NEED FOR CHEST PAIN AS DIRECTED, Disp: 30 tablet, Rfl: 0 .  nortriptyline (PAMELOR) 10 MG capsule, Take 1 capsule (10 mg total) by mouth at bedtime., Disp: 90 capsule, Rfl: 1 .  omeprazole (PRILOSEC) 20 MG capsule, Take  1 capsule (20 mg total) by mouth 2 (two) times daily., Disp: 180 capsule, Rfl: 1 .  pravastatin (PRAVACHOL) 40 MG tablet, Take 1 tablet (40 mg total) by mouth daily., Disp: 90 tablet, Rfl: 1 .  sitaGLIPtin (JANUVIA) 100 MG tablet, Take 1 tablet (100 mg total) by mouth daily., Disp: 90 tablet, Rfl: 1  Allergies  Allergen Reactions  . Ace Inhibitors     Other reaction(s): Cough  . Codeine Swelling  . Codeine Sulfate     Other reaction(s): Unknown  . Ezetimibe     Other reaction(s): Muscle Pain  . Other     Other reaction(s): Muscle Pain     ROS  Constitutional: Negative for fever or weight change.  Respiratory: Negative for cough and shortness of breath.   Cardiovascular: Negative for chest pain or palpitations.  Gastrointestinal: Positive  for abdominal pain -epigastric and mild regurgitation, no bowel changes.  Musculoskeletal: Negative for gait problem or joint swelling.  Skin: Negative for rash.  Neurological: Negative for dizziness or headache.  No other specific complaints in a complete review of systems (except as listed in HPI above).  Objective  Filed Vitals:   12/09/15 1121  BP: 124/58  Pulse: 108  Temp: 98.3 F (36.8 C)  TempSrc: Oral  Resp: 16  Weight: 162 lb 4.8 oz (73.619 kg)  SpO2: 98%    Body mass index is 25.41 kg/(m^2).  Physical Exam  Constitutional: Patient appears well-developed and well-nourished. No distress.  HEENT: head atraumatic, normocephalic, pupils equal and reactive to light,  neck supple, throat within normal limits Cardiovascular: Normal rate, regular rhythm and normal heart sounds.  No murmur heard. No BLE edema. Pulmonary/Chest: Effort normal and breath sounds normal. No respiratory distress. Abdominal: Soft.  There is no tenderness. Psychiatric: Patient has a normal mood and affect. behavior is normal. Judgment and thought content normal.  Recent Results (from the past 2160 hour(s))  Lipid panel     Status: None   Collection  Time: 09/16/15  9:57 AM  Result Value Ref Range   Cholesterol, Total 179 100 - 199 mg/dL   Triglycerides 42 0 - 149 mg/dL   HDL 97 >39 mg/dL    Comment: According to ATP-III Guidelines, HDL-C >59 mg/dL is considered a negative risk factor for CHD.    VLDL Cholesterol Cal 8 5 - 40 mg/dL   LDL Calculated 74 0 - 99 mg/dL   Chol/HDL Ratio 1.8 0.0 - 4.4 ratio units    Comment:                                   T. Chol/HDL Ratio  Men  Women                               1/2 Avg.Risk  3.4    3.3                                   Avg.Risk  5.0    4.4                                2X Avg.Risk  9.6    7.1                                3X Avg.Risk 23.4   11.0   Comprehensive metabolic panel     Status: Abnormal   Collection Time: 09/16/15  9:57 AM  Result Value Ref Range   Glucose 116 (H) 65 - 99 mg/dL   BUN 18 8 - 27 mg/dL   Creatinine, Ser 0.81 0.57 - 1.00 mg/dL   GFR calc non Af Amer 72 >59 mL/min/1.73   GFR calc Af Amer 83 >59 mL/min/1.73   BUN/Creatinine Ratio 22 11 - 26   Sodium 139 136 - 144 mmol/L   Potassium 4.6 3.5 - 5.2 mmol/L   Chloride 100 97 - 106 mmol/L   CO2 26 18 - 29 mmol/L   Calcium 9.3 8.7 - 10.3 mg/dL   Total Protein 6.7 6.0 - 8.5 g/dL   Albumin 4.1 3.5 - 4.8 g/dL   Globulin, Total 2.6 1.5 - 4.5 g/dL   Albumin/Globulin Ratio 1.6 1.1 - 2.5   Bilirubin Total 0.4 0.0 - 1.2 mg/dL   Alkaline Phosphatase 75 39 - 117 IU/L   AST 24 0 - 40 IU/L   ALT 18 0 - 32 IU/L  Vit D  25 hydroxy (rtn osteoporosis monitoring)     Status: None   Collection Time: 09/16/15  9:57 AM  Result Value Ref Range   Vit D, 25-Hydroxy 36.3 30.0 - 100.0 ng/mL    Comment: Vitamin D deficiency has been defined by the Institute of Medicine and an Endocrine Society practice guideline as a level of serum 25-OH vitamin D less than 20 ng/mL (1,2). The Endocrine Society went on to further define vitamin D insufficiency as a level between 21 and 29 ng/mL  (2). 1. IOM (Institute of Medicine). 2010. Dietary reference    intakes for calcium and D. [place]: The    [employer]. 2. Holick MF, Binkley [place], Bischoff-Ferrari HA, et al.    Evaluation, treatment, and prevention of vitamin D    deficiency: an Endocrine Society clinical practice    guideline. JCEM. 2011 Jul; 96(7):1911-30.   Quantiferon tb gold assay     Status: None   Collection Time: 12/03/15 12:19 PM  Result Value Ref Range   QUANTIFERON INCUBATION Comment     Comment: Specimen incubated at [facility], [place], [place].  QuantiFERON In Tube     Status: None   Collection Time: 12/03/15 12:19 PM  Result Value Ref Range   QUANTIFERON TB GOLD Negative Negative   QUANTIFERON CRITERIA Comment     Comment: To be considered positive a specimen should have a TB Ag minus Nil value greater than or equal to 0.35 IU/mL and in addition  the TB Ag minus Nil value must be greater than or equal to 25% of the Nil value. There may be insufficient information in these values to differentiate between some negative and some indeterminate test values.    QUANTIFERON TB AG VALUE 0.08 IU/mL   Quantiferon Nil Value 0.07 IU/mL   QUANTIFERON MITOGEN VALUE 3.95 IU/mL   QFT TB AG MINUS NIL VALUE 0.01 IU/mL   Interpretation: Comment     Comment: The QuantiFERON TB Gold (in Tube) assay is intended for use as an aid in the diagnosis of TB infection. Negative results suggest that there is no TB infection. In patients with high suspicion of exposure, a negative test should be repeated. A positive test indicates infection with Mycobacterium tuberculosis. Among individuals without tuberculosis infection, a positive test may be due to exposure to [place], M. szulgai or M. marinum. On the Internet, go to https://figueroa-lambert.info/ for further details.   POCT glycosylated hemoglobin (Hb A1C)     Status: Abnormal   Collection Time: 12/09/15 11:38 AM  Result Value Ref Range   Hemoglobin A1C 7.5   POCT UA -  Microalbumin     Status: Abnormal   Collection Time: 12/09/15 11:38 AM  Result Value Ref Range   Microalbumin Ur, POC 20 mg/L   Creatinine, POC  mg/dL   Albumin/Creatinine Ratio, Urine, POC       PHQ2/9: Depression screen [facility] 2/9 12/09/2015 11/22/2015 09/11/2015 09/04/2015 05/08/2015  Decreased Interest 0 0 0 0 1  Down, Depressed, Hopeless 0 0 1 0 1  PHQ - 2 Score 0 0 1 0 2  Altered sleeping - - - - -  Tired, decreased energy - - - - -  Change in appetite - - - - -  Feeling bad or failure about yourself  - - - - -  Trouble concentrating - - - - -  Moving slowly or fidgety/restless - - - - -  Suicidal thoughts - - - - -  PHQ-9 Score - - - - -  Difficult doing work/chores - - - - -     Fall Risk: Fall Risk  12/09/2015 11/22/2015 09/11/2015 09/04/2015 05/08/2015  Falls in the past year? No No - Yes Yes  Number falls in past yr: - - - 1 1  Injury with Fall? - - - No No  Risk for fall due to : - - History of fall(s) - History of fall(s)      Functional Status Survey: Is the patient deaf or have difficulty hearing?: Yes (patient is suppose to get some hearing due to the roaring in her ears.) Does the patient have difficulty seeing, even when wearing glasses/contacts?: No Does the patient have difficulty concentrating, remembering, or making decisions?: No Does the patient have difficulty walking or climbing stairs?: No Does the patient have difficulty dressing or bathing?: No Does the patient have difficulty doing errands alone such as visiting a doctor's office or shopping?: No    Assessment & Plan  1. Well controlled diabetes mellitus ([place])  Glucose has gone up, hgbA1C has gone up one point. She states she has not been compliant with her diet, but she will do better now - POCT glycosylated hemoglobin (Hb A1C) - POCT UA - Microalbumin - sitaGLIPtin (JANUVIA) 100 MG tablet; Take 1 tablet (100 mg total) by mouth daily.  Dispense: 90 tablet; Refill: 1  2. Hypertension,  benign  bp is at goal now - losartan (COZAAR) 50 MG tablet; Take 1 tablet (50  mg total) by mouth daily.  Dispense: 90 tablet; Refill: 1 - amLODipine (NORVASC) 5 MG tablet; Take 1 tablet (5 mg total) by mouth daily.  Dispense: 90 tablet; Refill: 1  3. Dyslipidemia  Continue medication  - pravastatin (PRAVACHOL) 40 MG tablet; Take 1 tablet (40 mg total) by mouth daily.  Dispense: 90 tablet; Refill: 1  4. Arteriosclerosis of coronary artery  Continue aspirin - pravastatin (PRAVACHOL) 40 MG tablet; Take 1 tablet (40 mg total) by mouth daily.  Dispense: 90 tablet; Refill: 1  6. HZV (herpes zoster virus) post herpetic neuralgia  - nortriptyline (PAMELOR) 10 MG capsule; Take 1 capsule (10 mg total) by mouth at bedtime.  Dispense: 90 capsule; Refill: 1  7. Urge incontinence of urine  - nortriptyline (PAMELOR) 10 MG capsule; Take 1 capsule (10 mg total) by mouth at bedtime.  Dispense: 90 capsule; Refill: 1

## 2015-12-18 ENCOUNTER — Encounter (INDEPENDENT_AMBULATORY_CARE_PROVIDER_SITE_OTHER): Payer: Self-pay

## 2015-12-18 ENCOUNTER — Other Ambulatory Visit: Payer: Self-pay | Admitting: Pharmacist

## 2015-12-19 ENCOUNTER — Encounter: Payer: Self-pay | Admitting: Pharmacist

## 2015-12-19 NOTE — Patient Outreach (Signed)
[facility]) Care Management  [place]   12/19/2015  MARGARETTE Petty 1941-10-10 [id number]  Subjective: Breanna Petty is a 75 year old female here today for a 3 month follow up with the clinical pharmacist. She is participating in the Link To Wellness program.  She complains of left shoulder pain, with occasional numbness in the hand and fingers, that comes and goes. She denies any pain at today's visit.  She was recently seen by Dr. Ancil Boozer for her diabetes check up. Her A1c has increased to 7.5% from 6.2% last fall. Patient feels this is from not adhering to her diet. Patient has also stopped exercising.  Objective:  BP = 143/80 mmHg, A1c = 7.5% on 12/09/15.   Current Medications: Current Outpatient Prescriptions  Medication Sig Dispense Refill  . amLODipine (NORVASC) 5 MG tablet Take 1 tablet (5 mg total) by mouth daily. 90 tablet 1  . aspirin EC 81 MG tablet Take 1 tablet by mouth daily.    . brimonidine (ALPHAGAN P) 0.1 % SOLN 2 drops Nightly.    . latanoprost (XALATAN) 0.005 % ophthalmic solution 2 drops at bedtime.    Marland Kitchen losartan (COZAAR) 50 MG tablet Take 1 tablet (50 mg total) by mouth daily. 90 tablet 1  . NITROSTAT 0.4 MG SL tablet DISSOLVE ONE TABLET UNDER TONGUE AS NEED FOR CHEST PAIN AS DIRECTED 30 tablet 0  . nortriptyline (PAMELOR) 10 MG capsule Take 1 capsule (10 mg total) by mouth at bedtime. 90 capsule 1  . omeprazole (PRILOSEC) 20 MG capsule Take 1 capsule (20 mg total) by mouth 2 (two) times daily. 180 capsule 1  . pravastatin (PRAVACHOL) 40 MG tablet Take 1 tablet (40 mg total) by mouth daily. 90 tablet 1  . sitaGLIPtin (JANUVIA) 100 MG tablet Take 1 tablet (100 mg total) by mouth daily. 90 tablet 1   No current facility-administered medications for this visit.    Functional Status: In your present state of health, do you have any difficulty performing the following activities: 12/19/2015 12/18/2015  Hearing? (No Data) Y  Vision? - N  Difficulty  concentrating or making decisions? - N  Walking or climbing stairs? - Y  Dressing or bathing? - N  Doing errands, shopping? - N    Fall/Depression Screening: PHQ 2/9 Scores 12/09/2015 11/22/2015 09/11/2015 09/04/2015 05/08/2015 05/07/2015  PHQ - 2 Score 0 0 1 0 2 2  PHQ- 9 Score - - - - - 10    [facility] CM Care Plan Problem One        Most Recent Value   Care Plan Problem One  Potential for long term complications as a result of diabetes   Role Documenting the Problem One  Clinical Pharmacist   Care Plan for Problem One  Active   [facility] Long Term Goal (31-90 days)  Maintain A1C less than 7%,  evidenced by POCT at MD's office   [place] Goal Start Date  09/11/15   Interventions for Problem One Long Term Goal  Continue to take medications as prescribed,  consider exercising one day per week to start inside or outside of hospital    [facility] CM Care Plan Problem Two        Most Recent Value   Care Plan Problem Two  Patient is not aware of her post prandial glucsoe levels   Role Documenting the Problem Two  Clinical Pharmacist   Care Plan for Problem Two  Active   Interventions for Problem Two  Long Term Goal   Explained the importance of monitoring the post-prandial blood sugar to see how well medications are working   [facility] Long Term Goal (31-90) days  Continue to check fasting blood sugars,  also check 2 hour post-prandial blood sugars 1-2 times per week,  evidenced by patient's glucose monitor   [facility] Long Term Goal Start Date  09/11/15      Assessment: 1. Diabetes: A1C not at goal of less than 7%. 2. Hypertension: BP not at goal of less than 140/90 mmHg. 3. Cholesterol: last checked on 09/16/15. Patient is at goal: LDL <100 mg/dl, TG <150 mg/dl, HDL >50 mg/dl 4. Quality of Life assessment completed.   Plan: 1. Diabetes: Patient will adhere to diabetic diet. Patient feels motivated to start exercising one day per week. 2. Hypertension: Amlodipine 5mg  daily was recently added to her medication  list. Pharmacist will follow up at next visit. 3. [name] will return in 3 months for her Link To Wellness follow up with the pharmacist.   [name]. Dicky Doe, PharmD [facility] [phone]

## 2015-12-25 ENCOUNTER — Other Ambulatory Visit: Payer: Self-pay | Admitting: Pharmacist

## 2015-12-25 NOTE — Patient Outreach (Signed)
Breanna Petty called the office yesterday to inquire about obtaining diabetic shoes. I called her today and referred her to [facility] in [place]. She was given the address and phone number for [facility] and will call them in the next week for directions. I explained to Breanna Petty that she will need to speak with the Diabetes specialist at [facility] and that they would help her with the required documentation needed to obtain the shoes through her insurance.  Breanna Petty K. Dicky Doe, PharmD [facility] [phone]

## 2015-12-26 ENCOUNTER — Other Ambulatory Visit: Payer: Self-pay | Admitting: Family Medicine

## 2015-12-26 NOTE — Telephone Encounter (Signed)
Patient requesting refill. 

## 2016-01-20 DIAGNOSIS — E119 Type 2 diabetes mellitus without complications: Secondary | ICD-10-CM | POA: Diagnosis not present

## 2016-01-20 DIAGNOSIS — D2371 Other benign neoplasm of skin of right lower limb, including hip: Secondary | ICD-10-CM | POA: Diagnosis not present

## 2016-01-20 DIAGNOSIS — B351 Tinea unguium: Secondary | ICD-10-CM | POA: Diagnosis not present

## 2016-01-20 DIAGNOSIS — M79671 Pain in right foot: Secondary | ICD-10-CM | POA: Diagnosis not present

## 2016-01-20 DIAGNOSIS — M79672 Pain in left foot: Secondary | ICD-10-CM | POA: Diagnosis not present

## 2016-02-10 DIAGNOSIS — H903 Sensorineural hearing loss, bilateral: Secondary | ICD-10-CM | POA: Diagnosis not present

## 2016-02-10 DIAGNOSIS — M79671 Pain in right foot: Secondary | ICD-10-CM | POA: Diagnosis not present

## 2016-02-10 DIAGNOSIS — E119 Type 2 diabetes mellitus without complications: Secondary | ICD-10-CM | POA: Diagnosis not present

## 2016-02-10 DIAGNOSIS — M79672 Pain in left foot: Secondary | ICD-10-CM | POA: Diagnosis not present

## 2016-02-10 DIAGNOSIS — D2371 Other benign neoplasm of skin of right lower limb, including hip: Secondary | ICD-10-CM | POA: Diagnosis not present

## 2016-02-11 DIAGNOSIS — E119 Type 2 diabetes mellitus without complications: Secondary | ICD-10-CM | POA: Diagnosis not present

## 2016-03-02 DIAGNOSIS — E119 Type 2 diabetes mellitus without complications: Secondary | ICD-10-CM | POA: Diagnosis not present

## 2016-03-02 DIAGNOSIS — B351 Tinea unguium: Secondary | ICD-10-CM | POA: Diagnosis not present

## 2016-03-02 DIAGNOSIS — D2371 Other benign neoplasm of skin of right lower limb, including hip: Secondary | ICD-10-CM | POA: Diagnosis not present

## 2016-03-03 DIAGNOSIS — I2510 Atherosclerotic heart disease of native coronary artery without angina pectoris: Secondary | ICD-10-CM | POA: Diagnosis not present

## 2016-03-03 DIAGNOSIS — E782 Mixed hyperlipidemia: Secondary | ICD-10-CM | POA: Diagnosis not present

## 2016-03-03 DIAGNOSIS — I10 Essential (primary) hypertension: Secondary | ICD-10-CM | POA: Diagnosis not present

## 2016-03-11 ENCOUNTER — Encounter: Payer: Self-pay | Admitting: Family Medicine

## 2016-03-11 ENCOUNTER — Ambulatory Visit (INDEPENDENT_AMBULATORY_CARE_PROVIDER_SITE_OTHER): Payer: 59 | Admitting: Family Medicine

## 2016-03-11 VITALS — BP 124/62 | HR 79 | Temp 98.0°F | Resp 12 | Ht 67.0 in | Wt 159.4 lb

## 2016-03-11 DIAGNOSIS — E1159 Type 2 diabetes mellitus with other circulatory complications: Secondary | ICD-10-CM | POA: Diagnosis not present

## 2016-03-11 DIAGNOSIS — R4589 Other symptoms and signs involving emotional state: Secondary | ICD-10-CM

## 2016-03-11 DIAGNOSIS — I2510 Atherosclerotic heart disease of native coronary artery without angina pectoris: Secondary | ICD-10-CM | POA: Diagnosis not present

## 2016-03-11 DIAGNOSIS — I10 Essential (primary) hypertension: Secondary | ICD-10-CM

## 2016-03-11 DIAGNOSIS — N3941 Urge incontinence: Secondary | ICD-10-CM

## 2016-03-11 DIAGNOSIS — F418 Other specified anxiety disorders: Secondary | ICD-10-CM

## 2016-03-11 DIAGNOSIS — B0229 Other postherpetic nervous system involvement: Secondary | ICD-10-CM | POA: Diagnosis not present

## 2016-03-11 DIAGNOSIS — E785 Hyperlipidemia, unspecified: Secondary | ICD-10-CM | POA: Diagnosis not present

## 2016-03-11 DIAGNOSIS — I252 Old myocardial infarction: Secondary | ICD-10-CM

## 2016-03-11 LAB — POCT GLYCOSYLATED HEMOGLOBIN (HGB A1C): Hemoglobin A1C: 6.9

## 2016-03-11 MED ORDER — LOSARTAN POTASSIUM 50 MG PO TABS: 50.0000 mg | ORAL_TABLET | Freq: Every day | ORAL | Status: DC

## 2016-03-11 MED ORDER — PRAVASTATIN SODIUM 40 MG PO TABS: 40.0000 mg | ORAL_TABLET | Freq: Every day | ORAL | Status: DC

## 2016-03-11 MED ORDER — NORTRIPTYLINE HCL 10 MG PO CAPS
10.0000 mg | ORAL_CAPSULE | Freq: Every day | ORAL | Status: DC
Start: 2016-03-11 — End: 2016-08-24

## 2016-03-11 MED ORDER — SITAGLIPTIN PHOSPHATE 100 MG PO TABS: 100.0000 mg | ORAL_TABLET | Freq: Every day | ORAL | Status: DC

## 2016-03-11 MED ORDER — AMLODIPINE BESYLATE 5 MG PO TABS: 5.0000 mg | ORAL_TABLET | Freq: Every day | ORAL | Status: DC

## 2016-03-11 NOTE — Progress Notes (Signed)
Name: Breanna Petty   MRN: [id number]    DOB: 1940-12-26   Date:03/11/2016       Progress Note  Subjective  Chief Complaint  Chief Complaint  Patient presents with  . Diabetes    patient checks her bs every morning. highest: 168 & lowest: 94  . Hypertension    patient has not had any neg sx.  . dyslipidemia  . arteriosclerosis of coronary artery  . Medication Refill  . Weight Loss    patient stated that she keeps losing weight and is concerned.    HPI  DMII : she checks fsbs at home varies, it can be as high as 168, and as low as 94. She denies polyphagia or polydipsia, she has urinary frequency. She has been compliant with Januvia and denies side effects of medication. She has a history of MI and atherosclerosis , on aspirin and NTG  HTN: she is on Losartan and denies side effects, bp is at goal. She denies chest pain or SOB  Hyperlipidemia and CAD: she is taking aspirin, statin and ARB. She is doing well, last labs were done in 10/2014  GERD: she is taking Omeprazole twice daily, she still wakes up with a bitter taste in her mouth, she is eating fresh garlic once daily because she heard it is good for prevention of blood clots. Symptoms is under control at this time  Post-herpetic neuralgia: doing much better since started on Nortriptyline , symptoms resolved  Urge Incontinence: she states symptoms improved with Nortriptyline but still has some symptoms, she still has nocturia, about two episodes per night, but is better than before.   Health Anxiety: she has not lost any weight since Feb, lost 5 lbs since last July, she has always been anxious about her inability to gain weight. She had multiple labs drawn and seen specialist, screening for cancer all up to date. Tried to give her reassurance. She refuses to take medication to gain weight. She has normal appetite    Patient Active Problem List   Diagnosis Date Noted  . Hammer toe of right foot 10/16/2015  . Nail deformity  10/16/2015  . Chronic obstructive pulmonary disease ([place]) 09/04/2015  . History of MI (myocardial infarction) 09/04/2015  . Spondylolisthesis at L4-L5 level 07/11/2015  . DDD (degenerative disc disease), lumbar 07/11/2015  . History of sarcoidosis 06/04/2015  . Urge incontinence of urine 06/04/2015  . Hearing loss of both ears 05/07/2015  . Anxiety and depression 05/04/2015  . Arteriosclerosis of coronary artery 05/04/2015  . Type 2 diabetes mellitus, uncontrolled ([place]) 05/04/2015  . Dyslipidemia 05/04/2015  . Hypertension, benign 05/04/2015  . Gastro-esophageal reflux disease without esophagitis 05/04/2015  . Hiatal hernia 05/04/2015  . HZV (herpes zoster virus) post herpetic neuralgia 05/04/2015  . Vitamin D deficiency 05/04/2015  . S/P coronary artery stent placement 05/04/2015    Past Surgical History  Procedure Laterality Date  . Appendectomy    . Abdominal hysterectomy  1979    Family History  Problem Relation Age of Onset  . Diabetes Daughter     Social History   Social History  . Marital Status: Single    Spouse Name: N/A  . Number of Children: N/A  . Years of Education: N/A   Occupational History  . Not on file.   Social History Main Topics  . Smoking status: Former Smoker -- 2.00 packs/day for 10 years    Types: Cigarettes    Quit date: 11/16/1988  . Smokeless tobacco: Never  Used  . Alcohol Use: No  . Drug Use: No  . Sexual Activity: Not Currently   Other Topics Concern  . Not on file   Social History Narrative     Current outpatient prescriptions:  .  amLODipine (NORVASC) 5 MG tablet, Take 1 tablet (5 mg total) by mouth daily., Disp: 90 tablet, Rfl: 1 .  aspirin EC 81 MG tablet, Take 1 tablet by mouth daily., Disp: , Rfl:  .  BAYER CONTOUR NEXT TEST test strip, TEST 2 TIMES A DAY, Disp: 200 each, Rfl: 3 .  brimonidine (ALPHAGAN P) 0.1 % SOLN, 2 drops Nightly., Disp: , Rfl:  .  latanoprost (XALATAN) 0.005 % ophthalmic solution, 2 drops at  bedtime., Disp: , Rfl:  .  losartan (COZAAR) 50 MG tablet, Take 1 tablet (50 mg total) by mouth daily., Disp: 90 tablet, Rfl: 1 .  NITROSTAT 0.4 MG SL tablet, DISSOLVE ONE TABLET UNDER TONGUE AS NEED FOR CHEST PAIN AS DIRECTED, Disp: 30 tablet, Rfl: 0 .  nortriptyline (PAMELOR) 10 MG capsule, Take 1 capsule (10 mg total) by mouth at bedtime., Disp: 90 capsule, Rfl: 1 .  omeprazole (PRILOSEC) 20 MG capsule, Take 1 capsule (20 mg total) by mouth 2 (two) times daily., Disp: 180 capsule, Rfl: 1 .  pravastatin (PRAVACHOL) 40 MG tablet, Take 1 tablet (40 mg total) by mouth daily., Disp: 90 tablet, Rfl: 1 .  sitaGLIPtin (JANUVIA) 100 MG tablet, Take 1 tablet (100 mg total) by mouth daily., Disp: 90 tablet, Rfl: 1  Allergies  Allergen Reactions  . Ace Inhibitors     Other reaction(s): Cough  . Codeine Swelling  . Codeine Sulfate     Other reaction(s): Unknown  . Ezetimibe     Other reaction(s): Muscle Pain  . Other     Other reaction(s): Muscle Pain     ROS  Ten systems reviewed and is negative except as mentioned in HPI   Objective  Filed Vitals:   03/11/16 1153  BP: 124/62  Pulse: 79  Temp: 98 F (36.7 C)  TempSrc: Oral  Resp: 12  Height: 5\' 7"  (1.702 m)  Weight: 159 lb 6.4 oz (72.303 kg)  SpO2: 98%    Body mass index is 24.96 kg/(m^2).  Physical Exam  Constitutional: Patient appears well-developed and well-nourished.  No distress.  HEENT: head atraumatic, normocephalic, pupils equal and reactive to light,neck supple, throat within normal limits Cardiovascular: Normal rate, regular rhythm and normal heart sounds.  No murmur heard. No BLE edema. Pulmonary/Chest: Effort normal and breath sounds normal. No respiratory distress. Abdominal: Soft.  There is no tenderness. Psychiatric: Patient has a normal mood and affect. behavior is normal. Judgment and thought content normal. Very anxious about her weight  PHQ2/9: Depression screen [facility] 2/9 03/11/2016 12/09/2015 11/22/2015  09/11/2015 09/04/2015  Decreased Interest 0 0 0 0 0  Down, Depressed, Hopeless 1 0 0 1 0  PHQ - 2 Score 1 0 0 1 0  Altered sleeping - - - - -  Tired, decreased energy - - - - -  Change in appetite - - - - -  Feeling bad or failure about yourself  - - - - -  Trouble concentrating - - - - -  Moving slowly or fidgety/restless - - - - -  Suicidal thoughts - - - - -  PHQ-9 Score - - - - -  Difficult doing work/chores - - - - -     Fall Risk: Fall Risk  03/11/2016 12/09/2015  11/22/2015 09/11/2015 09/04/2015  Falls in the past year? No No No - Yes  Number falls in past yr: - - - - 1  Injury with Fall? - - - - No  Risk for fall due to : - - - History of fall(s) -      Functional Status Survey: Is the patient deaf or have difficulty hearing?: No Does the patient have difficulty seeing, even when wearing glasses/contacts?: No Does the patient have difficulty concentrating, remembering, or making decisions?: No Does the patient have difficulty walking or climbing stairs?: No Does the patient have difficulty dressing or bathing?: No Does the patient have difficulty doing errands alone such as visiting a doctor's office or shopping?: No    Assessment & Plan  1. Hypertension, benign  - amLODipine (NORVASC) 5 MG tablet; Take 1 tablet (5 mg total) by mouth daily.  Dispense: 90 tablet; Refill: 1 - losartan (COZAAR) 50 MG tablet; Take 1 tablet (50 mg total) by mouth daily.  Dispense: 90 tablet; Refill: 1  2. Arteriosclerosis of coronary artery  - pravastatin (PRAVACHOL) 40 MG tablet; Take 1 tablet (40 mg total) by mouth daily.  Dispense: 90 tablet; Refill: 1  3. Dyslipidemia  - pravastatin (PRAVACHOL) 40 MG tablet; Take 1 tablet (40 mg total) by mouth daily.  Dispense: 90 tablet; Refill: 1  4. Type 2 diabetes mellitus with other circulatory complications (HCC)  - sitaGLIPtin (JANUVIA) 100 MG tablet; Take 1 tablet (100 mg total) by mouth daily.  Dispense: 90 tablet; Refill: 1 - POCT  HgB A1C  5. Anxiety about health  She refuses to see counselor or take medication  6. HZV (herpes zoster virus) post herpetic neuralgia  - nortriptyline (PAMELOR) 10 MG capsule; Take 1 capsule (10 mg total) by mouth at bedtime.  Dispense: 90 capsule; Refill: 1  7. Urge incontinence of urine  - nortriptyline (PAMELOR) 10 MG capsule; Take 1 capsule (10 mg total) by mouth at bedtime.  Dispense: 90 capsule; Refill: 1   8. History of MI (myocardial infarction)  Continue aspirin, statin, and NTG - never had to use it

## 2016-03-25 ENCOUNTER — Other Ambulatory Visit: Payer: Self-pay | Admitting: Pharmacist

## 2016-03-26 NOTE — Patient Outreach (Signed)
Breanna Petty was scheduled for a Link To Wellness follow up visit on 03/25/16. She did not make this appointment. I left her a message on her voice mail to call me so we can reschedule the appointment.  Gurveer Colucci K. Dicky Doe, PharmD [facility] [phone]

## 2016-04-22 ENCOUNTER — Other Ambulatory Visit: Payer: Self-pay | Admitting: Pharmacist

## 2016-04-23 NOTE — Patient Outreach (Signed)
[facility]) Care Management  [place]   04/23/2016  Breanna Petty 1941-04-15 [id number]  Subjective: Breanna Petty is a 75 year old female here today for her 3 month Link To Wellness follow up. She describes an aching pain on both sides of her ribs, upper arms and shoulders. The pain is worse in the morning and subsides over the course of the day. She also states that "she feels like a cold is in her chest".  Breanna Petty is interested in updating her written will.  Breanna Petty has not had an eye exam in over 12 months.  Breanna Petty's A1c improved from 7.5% to 6.9%.  Objective:  Today's Vitals   04/22/16 1134 04/22/16 1136  BP: 142/77   Pulse: 79   Height: 1.702 m (5\' 7" )   Weight: 158 lb (71.668 kg)   PainSc:  5    Lab Results  Component Value Date   HGBA1C 6.9 03/11/2016   Lipid Panel 09-16-15 TC = 173 mg/dl; TG = 42 mg/dl; HDL = 97 mg/dl; LDL = 74 mg/dl    Encounter Medications: Outpatient Encounter Prescriptions as of 04/22/2016  Medication Sig Note  . amLODipine (NORVASC) 5 MG tablet Take 1 tablet (5 mg total) by mouth daily.   Marland Kitchen aspirin EC 81 MG tablet Take 1 tablet by mouth daily. 05/07/2015: Received from: [facility]: Take by mouth.  Marland Kitchen BAYER CONTOUR NEXT TEST test strip TEST 2 TIMES A DAY   . brimonidine (ALPHAGAN P) 0.1 % SOLN 2 drops Nightly.   . latanoprost (XALATAN) 0.005 % ophthalmic solution 2 drops at bedtime.   Marland Kitchen losartan (COZAAR) 50 MG tablet Take 1 tablet (50 mg total) by mouth daily.   Marland Kitchen NITROSTAT 0.4 MG SL tablet DISSOLVE ONE TABLET UNDER TONGUE AS NEED FOR CHEST PAIN AS DIRECTED   . nortriptyline (PAMELOR) 10 MG capsule Take 1 capsule (10 mg total) by mouth at bedtime.   Marland Kitchen omeprazole (PRILOSEC) 20 MG capsule Take 1 capsule (20 mg total) by mouth 2 (two) times daily.   . pravastatin (PRAVACHOL) 40 MG tablet Take 1 tablet (40 mg total) by mouth daily.   . sitaGLIPtin (JANUVIA) 100 MG tablet Take 1 tablet (100  mg total) by mouth daily.    No facility-administered encounter medications on file as of 04/22/2016.    Functional Status: In your present state of health, do you have any difficulty performing the following activities: 04/23/2016 04/22/2016  Hearing? - Y  Vision? - N  Difficulty concentrating or making decisions? Tempie Donning  Walking or climbing stairs? - Y  Dressing or bathing? - N  Doing errands, shopping? - N    Fall/Depression Screening: PHQ 2/9 Scores 04/22/2016 03/11/2016 12/09/2015 11/22/2015 09/11/2015 09/04/2015 05/08/2015  PHQ - 2 Score 1 1 0 0 1 0 2  PHQ- 9 Score - - - - - - -   [facility] CM Care Plan Problem One        Most Recent Value   Care Plan Problem One  Potential for long term complications as a result of diabetes   Role Documenting the Problem One  Clinical Pharmacist   Care Plan for Problem One  Active   [facility] Long Term Goal (31-90 days)  Maintain A1C less than 7%,  evidenced by POCT at MD's office   [facility] Goal Start Date  04/22/16   Interventions for Problem One Long Term Goal  Continue to take medications as prescribed,  consider exercising one day per week to start inside or outside of hospital    [facility] CM Care Plan Problem Two        Most Recent Value   Care Plan Problem Two  Patient is not aware of her post prandial glucsoe levels   Role Documenting the Problem Two  Clinical Pharmacist   Care Plan for Problem Two  Active   Interventions for Problem Two Long Term Goal   Explained the importance of monitoring the post-prandial blood sugar to see how well medications are working   [facility] Long Term Goal (31-90) days  Continue to check fasting blood sugars,  also check 2 hour post-prandial blood sugars 1-2 times per week,  evidenced by patient's glucose monitor   [facility] Long Term Goal Start Date  09/11/15     Assessment: 1. Diabetes: A1c at goal of less than 7%. 2. Hypertension: BP not at goal of less than 140/90 mmHg. 3. Cholesterol: within goal: LDL < 100 mg/dl; TG < 150 mg/dl;  HDL > 50 mg/dl 4. Advanced Directive: Breanna Petty has a will that she has hand written. She is interested in having this formalized.  Plan: 1. Diabetes: patient will continue to watch carbohydrates and portion sizes. Patient motivated to start walking one day per week before work. Breanna Petty will schedule her eye exam. Encouraged Breanna Petty to check     post-prandial blood glucose on the weekends; two hours post meal. 2. Hypertension: will continue to watch blood pressure and assess at next encounter. 3. Pain: encouraged Breanna Petty to call her provider's office to discuss the ache in her rib and arm pain as well as the "cold" in her chest. 4. Advanced Directive: Pharmacist to contact [facility] and inquire about potential services to update her will. 5. Breanna Petty will return in 3 months for her Link To Wellness visit with the pharmacist.  [name]. Dicky Doe, PharmD [facility] [phone]

## 2016-04-27 ENCOUNTER — Emergency Department: Payer: 59

## 2016-04-27 ENCOUNTER — Ambulatory Visit
Admission: EM | Admit: 2016-04-27 | Discharge: 2016-04-27 | Disposition: A | Discharge status: Other Acute Inpatient Hospital | Payer: 59 | Attending: Family Medicine | Admitting: Family Medicine

## 2016-04-27 ENCOUNTER — Telehealth: Payer: Self-pay

## 2016-04-27 ENCOUNTER — Encounter: Payer: Self-pay | Admitting: Emergency Medicine

## 2016-04-27 ENCOUNTER — Emergency Department
Admission: EM | Admit: 2016-04-27 | Discharge: 2016-04-27 | Disposition: A | Discharge status: Home / Self Care | Payer: 59 | Attending: Emergency Medicine | Admitting: Emergency Medicine

## 2016-04-27 DIAGNOSIS — I10 Essential (primary) hypertension: Secondary | ICD-10-CM | POA: Insufficient documentation

## 2016-04-27 DIAGNOSIS — F329 Major depressive disorder, single episode, unspecified: Secondary | ICD-10-CM | POA: Diagnosis not present

## 2016-04-27 DIAGNOSIS — E119 Type 2 diabetes mellitus without complications: Secondary | ICD-10-CM | POA: Insufficient documentation

## 2016-04-27 DIAGNOSIS — I25119 Atherosclerotic heart disease of native coronary artery with unspecified angina pectoris: Secondary | ICD-10-CM | POA: Insufficient documentation

## 2016-04-27 DIAGNOSIS — I2510 Atherosclerotic heart disease of native coronary artery without angina pectoris: Secondary | ICD-10-CM | POA: Diagnosis not present

## 2016-04-27 DIAGNOSIS — Z79899 Other long term (current) drug therapy: Secondary | ICD-10-CM | POA: Diagnosis not present

## 2016-04-27 DIAGNOSIS — Z8679 Personal history of other diseases of the circulatory system: Secondary | ICD-10-CM | POA: Diagnosis not present

## 2016-04-27 DIAGNOSIS — Z7984 Long term (current) use of oral hypoglycemic drugs: Secondary | ICD-10-CM | POA: Diagnosis not present

## 2016-04-27 DIAGNOSIS — Z87891 Personal history of nicotine dependence: Secondary | ICD-10-CM | POA: Diagnosis not present

## 2016-04-27 DIAGNOSIS — E785 Hyperlipidemia, unspecified: Secondary | ICD-10-CM | POA: Insufficient documentation

## 2016-04-27 DIAGNOSIS — R079 Chest pain, unspecified: Secondary | ICD-10-CM | POA: Diagnosis not present

## 2016-04-27 DIAGNOSIS — R0789 Other chest pain: Secondary | ICD-10-CM

## 2016-04-27 DIAGNOSIS — Z7982 Long term (current) use of aspirin: Secondary | ICD-10-CM | POA: Diagnosis not present

## 2016-04-27 DIAGNOSIS — M5136 Other intervertebral disc degeneration, lumbar region: Secondary | ICD-10-CM | POA: Diagnosis not present

## 2016-04-27 DIAGNOSIS — I252 Old myocardial infarction: Secondary | ICD-10-CM | POA: Insufficient documentation

## 2016-04-27 DIAGNOSIS — J449 Chronic obstructive pulmonary disease, unspecified: Secondary | ICD-10-CM | POA: Insufficient documentation

## 2016-04-27 LAB — BASIC METABOLIC PANEL
Anion gap: 7 (ref 5–15)
BUN: 14 mg/dL (ref 6–20)
CHLORIDE: 103 mmol/L (ref 101–111)
CO2: 28 mmol/L (ref 22–32)
CREATININE: 0.9 mg/dL (ref 0.44–1.00)
Calcium: 9.8 mg/dL (ref 8.9–10.3)
Glucose, Bld: 135 mg/dL — ABNORMAL HIGH (ref 65–99)
POTASSIUM: 4.4 mmol/L (ref 3.5–5.1)
SODIUM: 138 mmol/L (ref 135–145)

## 2016-04-27 LAB — CBC
HEMATOCRIT: 35.2 % (ref 35.0–47.0)
Hemoglobin: 11.7 g/dL — ABNORMAL LOW (ref 12.0–16.0)
MCH: 30.2 pg (ref 26.0–34.0)
MCHC: 33.2 g/dL (ref 32.0–36.0)
MCV: 91.1 fL (ref 80.0–100.0)
PLATELETS: 203 10*3/uL (ref 150–440)
RBC: 3.87 MIL/uL (ref 3.80–5.20)
RDW: 13.7 % (ref 11.5–14.5)
WBC: 5.8 10*3/uL (ref 3.6–11.0)

## 2016-04-27 LAB — TROPONIN I: Troponin I: <0.03 ng/mL (ref <0.031)

## 2016-04-27 MED ORDER — ASPIRIN 81 MG PO CHEW
243.0000 mg | CHEWABLE_TABLET | Freq: Once | ORAL | Status: AC
  Administered 2016-04-27: 243 mg via ORAL
  Filled 2016-04-27: qty 3

## 2016-04-27 NOTE — ED Notes (Addendum)
Pt is denying EKG; states that we have already looked at the one that was taken in [facility].  States that another EKG is not necessary and is concerned about the cost.  MD notified.

## 2016-04-27 NOTE — ED Provider Notes (Signed)
CSN: [id number]     Arrival date & time 04/27/16  1027 History   None   Nurses notes were reviewed. Chief Complaint  Patient presents with  . Chest Pain   Patient's here because of chest pain. She reports chest heaviness fullness is been going on for several days now. She states that the chest fullness is progressively getting worse in her chest and now she's having some radiation down her arms as well. Should be noted that about 3-4 years ago she had her no chest pain but had pain in her arms and turns out that she had a smooth light heart attack and she wanted up with stents put in to replace the arm past her arm pain that she was experiencing. She states she's had some follow-up stress test has not had a stress test probably over a year this time. She states she called her PCP office and they directed Mr. going to the emergency room to come to the urgent care. States still having some pain in both arms. She does take a baby aspirin daily and she has her taking 1 baby aspirin today.  Past smoker history she is a history of hypertension depression anxiety hypokinemia and diabetes as well as coronary artery disease. She also history of sarcoid and hiatal hernia. Past previous surgeries other than the stent placement includes appendectomy and abdominal hysterectomy.   Mother had coronary artery disease and multiple heart attacks in the past. Patient is a former smoker 2 packs a day before she stopped over 20 years ago.  (Consider location/radiation/quality/duration/timing/severity/associated sxs/prior Treatment) Patient is a 75 y.o. female presenting with chest pain. The history is provided by the patient. No language interpreter was used.  Chest Pain Pain location:  Substernal area Pain quality: radiating   Pain radiates to:  L arm and R arm Pain severity:  Moderate Onset quality:  Sudden Timing:  Constant Progression:  Worsening Chronicity:  New Relieved by:  Nothing Worsened by:   Movement Associated symptoms: cough   Associated symptoms: no shortness of breath   Risk factors: coronary artery disease   Risk factors: no diabetes mellitus, no high cholesterol and not female     Past Medical History  Diagnosis Date  . Hypertension   . Depression   . Anxiety   . Hyperlipidemia   . Diabetes mellitus without complication ([place])   . Vitamin D deficiency   . Atherosclerotic heart disease   . GERD (gastroesophageal reflux disease)   . History of shingles   . History of heart artery stent   . Hiatal hernia   . Sarcoidosis [facility])    Past Surgical History  Procedure Laterality Date  . Appendectomy    . Abdominal hysterectomy  1979  . Cardiac surgery     Family History  Problem Relation Age of Onset  . Diabetes Daughter    Social History  Substance Use Topics  . Smoking status: Former Smoker -- 2.00 packs/day for 10 years    Types: Cigarettes    Quit date: 11/16/1988  . Smokeless tobacco: Never Used  . Alcohol Use: No   OB History    No data available     Review of Systems  Respiratory: Positive for cough. Negative for shortness of breath.   Cardiovascular: Positive for chest pain.  All other systems reviewed and are negative.   Allergies  Ace inhibitors; Codeine; Codeine sulfate; Ezetimibe; and Other  Home Medications   Prior to Admission medications   Medication Sig  Start Date End Date Taking? Authorizing Provider  amLODipine (NORVASC) 5 MG tablet Take 1 tablet (5 mg total) by mouth daily. 03/11/16  Yes Steele Sizer, MD  aspirin EC 81 MG tablet Take 1 tablet by mouth daily.   Yes Historical Provider, MD  losartan (COZAAR) 50 MG tablet Take 1 tablet (50 mg total) by mouth daily. 03/11/16  Yes Steele Sizer, MD  BAYER CONTOUR NEXT TEST test strip TEST 2 TIMES A DAY 12/26/15   Steele Sizer, MD  brimonidine (ALPHAGAN P) 0.1 % SOLN 2 drops Nightly.    Historical Provider, MD  latanoprost (XALATAN) 0.005 % ophthalmic solution 2 drops at bedtime.     Historical Provider, MD  NITROSTAT 0.4 MG SL tablet DISSOLVE ONE TABLET UNDER TONGUE AS NEED FOR CHEST PAIN AS DIRECTED 11/28/15   Steele Sizer, MD  nortriptyline (PAMELOR) 10 MG capsule Take 1 capsule (10 mg total) by mouth at bedtime. 03/11/16   Steele Sizer, MD  omeprazole (PRILOSEC) 20 MG capsule Take 1 capsule (20 mg total) by mouth 2 (two) times daily. 12/09/15   Steele Sizer, MD  pravastatin (PRAVACHOL) 40 MG tablet Take 1 tablet (40 mg total) by mouth daily. 03/11/16   Steele Sizer, MD  sitaGLIPtin (JANUVIA) 100 MG tablet Take 1 tablet (100 mg total) by mouth daily. 03/11/16   Steele Sizer, MD   Meds Ordered and Administered this Visit  Medications - No data to display  BP 140/80 mmHg  Pulse 80  Temp(Src) 97 F (36.1 C) (Tympanic)  Resp 16  Ht 5\' 11"  (1.803 m)  Wt 158 lb (71.668 kg)  BMI 22.05 kg/m2  SpO2 100% No data found.   Physical Exam  Constitutional: She is oriented to person, place, and time. She appears well-developed and well-nourished.  HENT:  Head: Normocephalic and atraumatic.  Right Ear: External ear normal.  Left Ear: External ear normal.  Eyes: Conjunctivae are normal. Pupils are equal, round, and reactive to light.  Neck: Normal range of motion. Neck supple. No tracheal deviation present.  Cardiovascular: Normal rate, regular rhythm and normal heart sounds.   No murmur heard. Pulmonary/Chest: Effort normal and breath sounds normal. No respiratory distress. She has no wheezes.  Abdominal: Soft.  Musculoskeletal: Normal range of motion. She exhibits no tenderness.  Lymphadenopathy:    She has no cervical adenopathy.  Neurological: She is alert and oriented to person, place, and time.  Skin: Skin is warm and dry.  Vitals reviewed.   ED Course  Procedures (including critical care time)  Labs Review Labs Reviewed - No data to display  Imaging Review No results found.   Visual Acuity Review  Right Eye Distance:   Left Eye Distance:    Bilateral Distance:    Right Eye Near:   Left Eye Near:    Bilateral Near:         MDM   1. Chest pain radiating to arm   ED ECG REPORT I, Shameer Molstad H, the attending physician, personally viewed and interpreted this ECG.   Date: 04/27/2016  EKG Time:10:44:37  Rate: 83  Rhythm: normal EKG, normal sinus rhythm  Axis: 25  Intervals:none  ST&T Change: none  Patient with a normal EKG with known coronary artery disease. Initially ordered 3 more baby aspirin's for total for this morning the patient left before she got the 3 baby aspirins. Discussed with Nira Conn at [facility] ED charge nurse and will send patient am see ED for further evaluation. With patient her age with a  history of coronary artery disease and while the pain is not as intense as it was before still concerned about his angina pain at this time so would recommend evaluation in the ED. She is agreeable to that. She she has however fused transportation by EMS and insist on going by car. Since EKG looks normal right now and this pain has been going on for several days acquiesce to her demand that she go by private car. As stated though charge nurse was called about patient's eminent arrival.    Note: This dictation was prepared with Dragon dictation along with smaller phrase technology. Any transcriptional errors that result from this process are unintentional.      Frederich Cha, MD 04/27/16 1225

## 2016-04-27 NOTE — ED Provider Notes (Addendum)
[facility] Emergency Department Provider Note  ____________________________________________  Time seen: Approximately 4:03 PM  I have reviewed the triage vital signs and the nursing notes.   HISTORY  Chief Complaint Chest Pain    HPI Breanna Petty is a 75 y.o. female who presents to the emergency department for further evaluation of chest pressure.  She was sent over by the [facility] urgent care for further evaluation.  She reports that her incision of chest tightness started several days ago and was somewhat gradual in onset.  There was some mild shortness of breath at the time.  However all her symptoms have completely resolved but they have been intermittent for several days..  She states thatshe had a prior mild MI with a stent placement years ago and has seen Dr. Nehemiah Massed.  She has not seen him recently.  She does take a daily baby aspirin and is taken one of them today.  She has a prior history of smoking and has hypertension and diabetes.  Reports that her symptoms were mild at the worst and have completely resolved.  She states that she does not really want to be here and is ready to go home.  Nothing particular makes symptoms better and they resolved on their own.   Past Medical History  Diagnosis Date  . Hypertension   . Depression   . Anxiety   . Hyperlipidemia   . Diabetes mellitus without complication ([place])   . Vitamin D deficiency   . Atherosclerotic heart disease   . GERD (gastroesophageal reflux disease)   . History of shingles   . History of heart artery stent   . Hiatal hernia   . Sarcoidosis [facility])     Patient Active Problem List   Diagnosis Date Noted  . Hammer toe of right foot 10/16/2015  . Nail deformity 10/16/2015  . Chronic obstructive pulmonary disease ([place]) 09/04/2015  . History of MI (myocardial infarction) 09/04/2015  . Spondylolisthesis at L4-L5 level 07/11/2015  . DDD (degenerative disc disease), lumbar 07/11/2015    . History of sarcoidosis 06/04/2015  . Urge incontinence of urine 06/04/2015  . Hearing loss of both ears 05/07/2015  . Anxiety and depression 05/04/2015  . Arteriosclerosis of coronary artery 05/04/2015  . Type 2 diabetes mellitus, uncontrolled ([place]) 05/04/2015  . Dyslipidemia 05/04/2015  . Hypertension, benign 05/04/2015  . Gastro-esophageal reflux disease without esophagitis 05/04/2015  . Hiatal hernia 05/04/2015  . HZV (herpes zoster virus) post herpetic neuralgia 05/04/2015  . Vitamin D deficiency 05/04/2015  . S/P coronary artery stent placement 05/04/2015    Past Surgical History  Procedure Laterality Date  . Appendectomy    . Abdominal hysterectomy  1979  . Cardiac surgery      Current Outpatient Rx  Name  Route  Sig  Dispense  Refill  . amLODipine (NORVASC) 5 MG tablet   Oral   Take 1 tablet (5 mg total) by mouth daily.   90 tablet   1   . aspirin EC 81 MG tablet   Oral   Take 1 tablet by mouth daily.         Marland Kitchen BAYER CONTOUR NEXT TEST test strip      TEST 2 TIMES A DAY   200 each   3     E11.9   . brimonidine (ALPHAGAN P) 0.1 % SOLN      2 drops Nightly.         . latanoprost (XALATAN) 0.005 % ophthalmic solution  2 drops at bedtime.         Marland Kitchen losartan (COZAAR) 50 MG tablet   Oral   Take 1 tablet (50 mg total) by mouth daily.   90 tablet   1   . NITROSTAT 0.4 MG SL tablet      DISSOLVE ONE TABLET UNDER TONGUE AS NEED FOR CHEST PAIN AS DIRECTED   30 tablet   0     Dispense as written.   . nortriptyline (PAMELOR) 10 MG capsule   Oral   Take 1 capsule (10 mg total) by mouth at bedtime.   90 capsule   1   . omeprazole (PRILOSEC) 20 MG capsule   Oral   Take 1 capsule (20 mg total) by mouth 2 (two) times daily.   180 capsule   1   . pravastatin (PRAVACHOL) 40 MG tablet   Oral   Take 1 tablet (40 mg total) by mouth daily.   90 tablet   1   . sitaGLIPtin (JANUVIA) 100 MG tablet   Oral   Take 1 tablet (100 mg total) by  mouth daily.   90 tablet   1     Allergies Ace inhibitors; Codeine; Codeine sulfate; Ezetimibe; and Other  Family History  Problem Relation Age of Onset  . Diabetes Daughter     Social History Social History  Substance Use Topics  . Smoking status: Former Smoker -- 2.00 packs/day for 10 years    Types: Cigarettes    Quit date: 11/16/1988  . Smokeless tobacco: Never Used  . Alcohol Use: No    Review of Systems Constitutional: No fever/chills Eyes: No visual changes. ENT: No sore throat. Cardiovascular: +chest pressure Respiratory: mild shortness of breath. Gastrointestinal: No abdominal pain.  No nausea, no vomiting.  No diarrhea.  No constipation. Genitourinary: Negative for dysuria. Musculoskeletal: Negative for back pain. Skin: Negative for rash. Neurological: Negative for headaches, focal weakness or numbness.  10-point ROS otherwise negative.  ____________________________________________   PHYSICAL EXAM:  VITAL SIGNS: ED Triage Vitals  Enc Vitals Group     BP 04/27/16 1155 114/99 mmHg     Pulse Rate 04/27/16 1155 81     Resp 04/27/16 1155 16     Temp 04/27/16 1155 98.1 F (36.7 C)     Temp Source 04/27/16 1155 Oral     SpO2 04/27/16 1155 100 %     Weight 04/27/16 1155 159 lb (72.122 kg)     Height 04/27/16 1155 5\' 11"  (1.803 m)     Head Cir --      Peak Flow --      Pain Score 04/27/16 1155 0     Pain Loc --      Pain Edu? --      Excl. in [place]? --     Constitutional: Alert and oriented. Well appearing and in no acute distress. Eyes: Conjunctivae are normal. PERRL. EOMI. Head: Atraumatic. Nose: No congestion/rhinnorhea. Mouth/Throat: Mucous membranes are moist.  Oropharynx non-erythematous. Neck: No stridor.  No meningeal signs.   Cardiovascular: Normal rate, regular rhythm. Good peripheral circulation. Grossly normal heart sounds.   Respiratory: Normal respiratory effort.  No retractions. Lungs CTAB. Gastrointestinal: Soft and nontender. No  distention.  Musculoskeletal: No lower extremity tenderness nor edema. No gross deformities of extremities. Neurologic:  Normal speech and language. No gross focal neurologic deficits are appreciated.  Skin:  Skin is warm, dry and intact. No rash noted. Psychiatric: Mood and affect are normal. Speech and behavior are  normal.  ____________________________________________   LABS (all labs ordered are listed, but only abnormal results are displayed)  Labs Reviewed  BASIC METABOLIC PANEL - Abnormal; Notable for the following:    Glucose, Bld 135 (*)    All other components within normal limits  CBC - Abnormal; Notable for the following:    Hemoglobin 11.7 (*)    All other components within normal limits  TROPONIN I  TROPONIN I   ____________________________________________  EKG  ED ECG REPORT #1 I, Kirsty Monjaraz, the attending physician, personally viewed and interpreted this ECG.  Date: 04/27/2016 EKG Time: 10:44  (obtained at [facility]) Rate: 83 Rhythm: normal sinus rhythm QRS Axis: normal Intervals: normal ST/T Wave abnormalities: normal Conduction Disturbances: none Narrative Interpretation: unremarkable    ____________________________________________  RADIOLOGY   Dg Chest 2 View  04/27/2016  CLINICAL DATA:  Chest pain for several days, progressing EXAM: CHEST  2 VIEW COMPARISON:  December 04, 2015 FINDINGS: There is no edema or consolidation. The heart size and pulmonary vascularity are normal. There is no appreciable adenopathy. There is a surgical clip in the middle mediastinal region, stable. There is degenerative change in the thoracic spine and both shoulders. IMPRESSION: No edema or consolidation. Electronically Signed   By: Lowella Grip III M.D.   On: 04/27/2016 12:42    ____________________________________________   PROCEDURES  Procedure(s) performed: None  Critical Care performed:  No ____________________________________________   INITIAL IMPRESSION / ASSESSMENT AND PLAN / ED COURSE  Pertinent labs & imaging results that were available during my care of the patient were reviewed by me and considered in my medical decision making (see chart for details).  The patient had 2 negative troponins.  Her EKG was unremarkable from the [facility] urgent care and she refused another one here in this department, citing cost.  Says she is no longer symptomatic I did not try to convince her to get another one.  She does not want to stay in the emergency department, much less the hospital, so I call Dr. Nehemiah Massed and he agreed to see her in clinic tomorrow afternoon.  She is amenable to this plan.  I gave an additional 3 baby aspirin in the ED prior to discharge.    I gave my usual and customary return precautions.     ____________________________________________  FINAL CLINICAL IMPRESSION(S) / ED DIAGNOSES  Final diagnoses:  Atypical chest pain  Coronary artery disease with unspecified angina pectoris     MEDICATIONS GIVEN DURING THIS VISIT:  Medications  aspirin chewable tablet 243 mg (243 mg Oral Given 04/27/16 1700)     NEW OUTPATIENT MEDICATIONS STARTED DURING THIS VISIT:  Discharge Medication List as of 04/27/2016  4:44 PM        Note:  This document was prepared using Dragon voice recognition software and may include unintentional dictation errors.   Hinda Kehr, MD 04/27/16 2039

## 2016-04-27 NOTE — Discharge Instructions (Signed)

## 2016-04-27 NOTE — ED Notes (Signed)
EKG was performed at [facility] prior to arrival and was signed by edp wth no new orders for a new EKG at this time.

## 2016-04-27 NOTE — ED Notes (Signed)
Pt in via triage with complaints of chest tightness, shortness of breath since this morning.  Pt with a cardiac hx, pt denies any other associating symptoms.  Pt not in any pain at this time.  Pt A/Ox4, no immediate distress noted.

## 2016-04-27 NOTE — ED Notes (Signed)
Pt provided saltines and peanut butter for a snack per MD.

## 2016-04-27 NOTE — ED Notes (Signed)
Patient went to the bathroom after signing the AMA and when I returned to give her 3 baby Aspirin she had already left.  Patient did state during triage that she took 1tablet of Aspirin 81mg  tablet this morning.

## 2016-04-27 NOTE — Telephone Encounter (Signed)
Per the request of Dr. Ancil Boozer, I tried to contact this patient, but there was no answer nor was there a voicemail, so no message was left.    Please check on her. Thank you    ----- Message -----     From: Cammy Copa, [facility]     Sent: 04/23/2016  5:28 PM      To: Steele Sizer, MD        Dr. Ancil Boozer,    Ms. Derosa was seen for her 3 month [facility] diabetes follow up. She complains of pain on her sides (rib area) and upper arm and shoulder pain. She also felt as if she had a "cold" in her chest. She was encouraged to follow up with a phone call to your office.     Thank you,    Keri K. Dicky Doe, PharmD    [facility]    [phone]

## 2016-04-27 NOTE — ED Notes (Signed)
Patient c/o tightness and pain in her chest for a week.

## 2016-04-27 NOTE — ED Notes (Signed)
Pt presents with chest discomfort with left arm pain. Pt was seen at [facility] prior to arrival and was told to come here to the ED for further eval.

## 2016-04-27 NOTE — Discharge Instructions (Signed)

## 2016-05-12 DIAGNOSIS — H401112 Primary open-angle glaucoma, right eye, moderate stage: Secondary | ICD-10-CM | POA: Diagnosis not present

## 2016-05-21 ENCOUNTER — Ambulatory Visit: Payer: 59 | Admitting: Family Medicine

## 2016-06-03 ENCOUNTER — Encounter: Payer: Self-pay | Admitting: Family Medicine

## 2016-06-03 ENCOUNTER — Ambulatory Visit (INDEPENDENT_AMBULATORY_CARE_PROVIDER_SITE_OTHER): Payer: 59 | Admitting: Family Medicine

## 2016-06-03 VITALS — BP 142/74 | HR 61 | Temp 98.7°F | Resp 16 | Ht 71.0 in | Wt 160.0 lb

## 2016-06-03 DIAGNOSIS — Z7189 Other specified counseling: Secondary | ICD-10-CM | POA: Diagnosis not present

## 2016-06-03 DIAGNOSIS — R1011 Right upper quadrant pain: Secondary | ICD-10-CM

## 2016-06-03 DIAGNOSIS — Z7689 Persons encountering health services in other specified circumstances: Secondary | ICD-10-CM

## 2016-06-03 DIAGNOSIS — R079 Chest pain, unspecified: Secondary | ICD-10-CM | POA: Diagnosis not present

## 2016-06-03 DIAGNOSIS — R634 Abnormal weight loss: Secondary | ICD-10-CM

## 2016-06-03 DIAGNOSIS — I2510 Atherosclerotic heart disease of native coronary artery without angina pectoris: Secondary | ICD-10-CM

## 2016-06-03 NOTE — Progress Notes (Signed)
Name: Breanna Petty   MRN: [id number]    DOB: March 13, 1941   Date:06/03/2016       Progress Note  Subjective  Chief Complaint  Chief Complaint  Patient presents with  . OTHER    C/o0 chest tightness/heaviness intermitting x 4 months  . OTHER    Knot on left upper abdomen - tender to touch. States it is intermitting x 1 year   . Weight Loss    Unintentional weight loss     HPI Comments: Patient to establish care. Weight loss unintentional 2-3 years.  Chest Pain  This is a recurrent problem. The current episode started 1 to 4 weeks ago. The onset quality is gradual. The problem has been waxing and waning. The pain is present in the substernal region. The pain is at a severity of 8/10. The pain is moderate. Quality: "stuffiness"  The pain does not radiate. Pertinent negatives include no abdominal pain, back pain, claudication, cough, diaphoresis, dizziness, exertional chest pressure, fever, headaches, hemoptysis, irregular heartbeat, leg pain, lower extremity edema, malaise/fatigue, nausea, near-syncope, numbness, orthopnea, palpitations, PND, shortness of breath, sputum production, syncope, vomiting or weakness. Prior diagnostic workup includes cardiac catherization (stent x 1 3 years ago).  Abdominal Pain This is a chronic problem. The current episode started more than 1 year ago. The onset quality is gradual. The problem has been waxing and waning. The pain is located in the RUQ (feel a knot/ sometimes tender). The abdominal pain does not radiate. Pertinent negatives include no anorexia, arthralgias, belching, constipation, diarrhea, dysuria, fever, frequency, headaches, hematuria, melena, myalgias, nausea, vomiting or weight loss.    No problem-specific assessment & plan notes found for this encounter.   Past Medical History  Diagnosis Date  . Hypertension   . Depression   . Anxiety   . Hyperlipidemia   . Diabetes mellitus without complication ([place])   . Vitamin D deficiency   .  Atherosclerotic heart disease   . GERD (gastroesophageal reflux disease)   . History of shingles   . History of heart artery stent   . Hiatal hernia   . Sarcoidosis [facility])     Past Surgical History  Procedure Laterality Date  . Appendectomy    . Abdominal hysterectomy  1979  . Cardiac surgery      Family History  Problem Relation Age of Onset  . Diabetes Daughter     Social History   Social History  . Marital Status: Single    Spouse Name: N/A  . Number of Children: N/A  . Years of Education: N/A   Occupational History  . Not on file.   Social History Main Topics  . Smoking status: Former Smoker -- 2.00 packs/day for 10 years    Types: Cigarettes    Quit date: 11/16/1988  . Smokeless tobacco: Never Used  . Alcohol Use: No  . Drug Use: No  . Sexual Activity: Not Currently   Other Topics Concern  . Not on file   Social History Narrative    Allergies  Allergen Reactions  . Ace Inhibitors     Other reaction(s): Cough  . Codeine Swelling  . Codeine Sulfate     Other reaction(s): Unknown  . Ezetimibe     Other reaction(s): Muscle Pain  . Other     Other reaction(s): Muscle Pain     Review of Systems  Constitutional: Negative for fever, chills, weight loss, malaise/fatigue and diaphoresis.  HENT: Negative for ear discharge, ear pain and sore throat.  Eyes: Negative for blurred vision.  Respiratory: Negative for cough, hemoptysis, sputum production, shortness of breath and wheezing.   Cardiovascular: Positive for chest pain. Negative for palpitations, orthopnea, claudication, leg swelling, syncope, PND and near-syncope.  Gastrointestinal: Negative for heartburn, nausea, vomiting, abdominal pain, diarrhea, constipation, blood in stool, melena and anorexia.  Genitourinary: Negative for dysuria, urgency, frequency and hematuria.  Musculoskeletal: Negative for myalgias, back pain, joint pain, arthralgias and neck pain.  Skin: Negative for rash.  Neurological:  Negative for dizziness, tingling, sensory change, focal weakness, weakness, numbness and headaches.  Endo/Heme/Allergies: Negative for environmental allergies and polydipsia. Does not bruise/bleed easily.  Psychiatric/Behavioral: Negative for depression and suicidal ideas. The patient is not nervous/anxious and does not have insomnia.      Objective  Filed Vitals:   06/03/16 1042  BP: 142/74  Pulse: 61  Temp: 98.7 F (37.1 C)  TempSrc: Oral  Resp: 16  Height: 5\' 11"  (1.803 m)  Weight: 160 lb (72.576 kg)  SpO2: 97%    Physical Exam  Constitutional: She is well-developed, well-nourished, and in no distress. No distress.  HENT:  Head: Normocephalic and atraumatic.  Right Ear: External ear normal.  Left Ear: External ear normal.  Nose: Nose normal.  Mouth/Throat: Oropharynx is clear and moist.  Eyes: Conjunctivae and EOM are normal. Pupils are equal, round, and reactive to light. Right eye exhibits no discharge. Left eye exhibits no discharge.  Neck: Normal range of motion. Neck supple. No JVD present. No thyromegaly present.  Cardiovascular: Normal rate, regular rhythm, normal heart sounds and intact distal pulses.  Exam reveals no gallop and no friction rub.   No murmur heard. Pulmonary/Chest: Effort normal and breath sounds normal. She has no wheezes. She has no rales.  Abdominal: Soft. Bowel sounds are normal. She exhibits mass. There is hepatomegaly. There is tenderness in the right upper quadrant and right lower quadrant. There is no rebound, no guarding and no CVA tenderness.  Musculoskeletal: Normal range of motion. She exhibits no edema or tenderness.  Lymphadenopathy:    She has no cervical adenopathy.  Neurological: She is alert. She has normal reflexes.  Skin: Skin is warm and dry. She is not diaphoretic.  Psychiatric: Mood and affect normal.  Nursing note and vitals reviewed.     Assessment & Plan  Problem List Items Addressed This Visit    None    Visit  Diagnoses    Encounter to establish care with new doctor    -  Primary    Chest pain, unspecified chest pain type        Relevant Orders    Ambulatory referral to Cardiology    Right upper quadrant pain        and right flank area    Relevant Orders    CT Abdomen Pelvis W Contrast    CT Abdomen Pelvis Wo Contrast    Weight loss        Relevant Orders    CT Abdomen Pelvis W Contrast    CT Abdomen Pelvis Wo Contrast         Dr. Lorma Heater [facility]  06/03/2016

## 2016-06-05 DIAGNOSIS — D2371 Other benign neoplasm of skin of right lower limb, including hip: Secondary | ICD-10-CM | POA: Diagnosis not present

## 2016-06-05 DIAGNOSIS — M79671 Pain in right foot: Secondary | ICD-10-CM | POA: Diagnosis not present

## 2016-06-05 DIAGNOSIS — B351 Tinea unguium: Secondary | ICD-10-CM | POA: Diagnosis not present

## 2016-06-05 DIAGNOSIS — M79672 Pain in left foot: Secondary | ICD-10-CM | POA: Diagnosis not present

## 2016-06-05 DIAGNOSIS — E119 Type 2 diabetes mellitus without complications: Secondary | ICD-10-CM | POA: Diagnosis not present

## 2016-06-08 ENCOUNTER — Other Ambulatory Visit: Payer: Self-pay

## 2016-06-08 ENCOUNTER — Other Ambulatory Visit: Payer: Self-pay | Admitting: Family Medicine

## 2016-06-08 ENCOUNTER — Ambulatory Visit
Admission: RE | Admit: 2016-06-08 | Discharge: 2016-06-08 | Disposition: A | Discharge status: Home / Self Care | Payer: 59 | Source: Ambulatory Visit | Attending: Family Medicine | Admitting: Family Medicine

## 2016-06-08 DIAGNOSIS — R634 Abnormal weight loss: Secondary | ICD-10-CM | POA: Insufficient documentation

## 2016-06-08 DIAGNOSIS — K5730 Diverticulosis of large intestine without perforation or abscess without bleeding: Secondary | ICD-10-CM | POA: Diagnosis not present

## 2016-06-08 DIAGNOSIS — R1011 Right upper quadrant pain: Secondary | ICD-10-CM | POA: Diagnosis not present

## 2016-06-08 DIAGNOSIS — K5790 Diverticulosis of intestine, part unspecified, without perforation or abscess without bleeding: Secondary | ICD-10-CM | POA: Diagnosis not present

## 2016-06-08 DIAGNOSIS — I2510 Atherosclerotic heart disease of native coronary artery without angina pectoris: Secondary | ICD-10-CM | POA: Insufficient documentation

## 2016-06-08 DIAGNOSIS — I700 Atherosclerosis of aorta: Secondary | ICD-10-CM | POA: Diagnosis not present

## 2016-06-09 ENCOUNTER — Other Ambulatory Visit: Payer: Self-pay

## 2016-06-10 ENCOUNTER — Ambulatory Visit: Payer: 59 | Admitting: Family Medicine

## 2016-07-07 DIAGNOSIS — H04123 Dry eye syndrome of bilateral lacrimal glands: Secondary | ICD-10-CM | POA: Diagnosis not present

## 2016-07-29 ENCOUNTER — Ambulatory Visit: Payer: 59 | Admitting: Pharmacist

## 2016-08-13 ENCOUNTER — Other Ambulatory Visit: Payer: Self-pay | Admitting: Family Medicine

## 2016-08-17 ENCOUNTER — Other Ambulatory Visit: Payer: Self-pay

## 2016-08-24 ENCOUNTER — Ambulatory Visit (INDEPENDENT_AMBULATORY_CARE_PROVIDER_SITE_OTHER): Payer: 59 | Admitting: Family Medicine

## 2016-08-24 ENCOUNTER — Encounter: Payer: Self-pay | Admitting: Family Medicine

## 2016-08-24 VITALS — BP 120/62 | HR 70 | Ht 71.0 in | Wt 151.0 lb

## 2016-08-24 DIAGNOSIS — F32A Depression, unspecified: Secondary | ICD-10-CM

## 2016-08-24 DIAGNOSIS — F329 Major depressive disorder, single episode, unspecified: Secondary | ICD-10-CM

## 2016-08-24 DIAGNOSIS — IMO0001 Reserved for inherently not codable concepts without codable children: Secondary | ICD-10-CM

## 2016-08-24 DIAGNOSIS — E1165 Type 2 diabetes mellitus with hyperglycemia: Secondary | ICD-10-CM | POA: Diagnosis not present

## 2016-08-24 DIAGNOSIS — K219 Gastro-esophageal reflux disease without esophagitis: Secondary | ICD-10-CM

## 2016-08-24 DIAGNOSIS — E785 Hyperlipidemia, unspecified: Secondary | ICD-10-CM | POA: Diagnosis not present

## 2016-08-24 DIAGNOSIS — B0229 Other postherpetic nervous system involvement: Secondary | ICD-10-CM | POA: Diagnosis not present

## 2016-08-24 DIAGNOSIS — N3941 Urge incontinence: Secondary | ICD-10-CM

## 2016-08-24 DIAGNOSIS — I10 Essential (primary) hypertension: Secondary | ICD-10-CM | POA: Diagnosis not present

## 2016-08-24 DIAGNOSIS — I2510 Atherosclerotic heart disease of native coronary artery without angina pectoris: Secondary | ICD-10-CM

## 2016-08-24 MED ORDER — NORTRIPTYLINE HCL 10 MG PO CAPS: 10.0000 mg | ORAL_CAPSULE | Freq: Every day | ORAL | 1 refills | Status: DC

## 2016-08-24 MED ORDER — OMEPRAZOLE 20 MG PO CPDR: 20.0000 mg | DELAYED_RELEASE_CAPSULE | Freq: Two times a day (BID) | ORAL | 1 refills | Status: DC

## 2016-08-24 MED ORDER — PRAVASTATIN SODIUM 40 MG PO TABS
40.0000 mg | ORAL_TABLET | Freq: Every day | ORAL | 1 refills | Status: DC
Start: 2016-08-24 — End: 2016-12-29

## 2016-08-24 MED ORDER — LOSARTAN POTASSIUM 50 MG PO TABS: 50.0000 mg | ORAL_TABLET | Freq: Every day | ORAL | 1 refills | Status: DC

## 2016-08-24 MED ORDER — AMLODIPINE BESYLATE 5 MG PO TABS
5.0000 mg | ORAL_TABLET | Freq: Every day | ORAL | 1 refills | Status: DC
Start: 2016-08-24 — End: 2016-12-29

## 2016-08-24 MED ORDER — SITAGLIPTIN PHOSPHATE 100 MG PO TABS: 100.0000 mg | ORAL_TABLET | Freq: Every day | ORAL | 1 refills | Status: DC

## 2016-08-24 NOTE — Progress Notes (Signed)
Name: Breanna Petty   MRN: [id number]    DOB: 12-Jan-1941   Date:08/24/2016       Progress Note  Subjective  Chief Complaint  Chief Complaint  Patient presents with  . Hypertension  . Hyperlipidemia  . Gastroesophageal Reflux  . Diabetes    Hypertension  This is a chronic problem. The current episode started more than 1 year ago. The problem has been gradually improving since onset. The problem is controlled. Associated symptoms include chest pain. Pertinent negatives include no anxiety, blurred vision, headaches, malaise/fatigue, neck pain, orthopnea, palpitations, peripheral edema, PND, shortness of breath or sweats. There are no associated agents to hypertension. Risk factors for coronary artery disease include diabetes mellitus and dyslipidemia. Past treatments include calcium channel blockers and angiotensin blockers. The current treatment provides moderate improvement. There are no compliance problems.  There is no history of angina, kidney disease, CAD/MI, CVA, heart failure, left ventricular hypertrophy, PVD, renovascular disease or retinopathy. There is no history of chronic renal disease or a hypertension causing med.  Hyperlipidemia  This is a chronic problem. The current episode started more than 1 year ago. The problem is controlled. Recent lipid tests were reviewed and are normal. She has no history of chronic renal disease, diabetes, hypothyroidism, liver disease, obesity or nephrotic syndrome. Associated symptoms include chest pain. Pertinent negatives include no focal sensory loss, focal weakness, leg pain, myalgias or shortness of breath. The current treatment provides moderate improvement of lipids. There are no compliance problems.  Risk factors for coronary artery disease include diabetes mellitus, hypertension, post-menopausal and dyslipidemia.  Gastroesophageal Reflux  She complains of chest pain and heartburn. She reports no abdominal pain, no belching, no choking, no  coughing, no dysphagia, no early satiety, no globus sensation, no hoarse voice, no nausea, no sore throat, no stridor, no tooth decay, no water brash or no wheezing. This is a new problem. The current episode started more than 1 year ago. The problem has been gradually improving. The symptoms are aggravated by certain foods. Pertinent negatives include no anemia, fatigue, melena, muscle weakness, orthopnea or weight loss. There are no known risk factors. She has tried a PPI for the symptoms. The treatment provided mild relief.  Diabetes  She presents for her follow-up diabetic visit. She has type 2 diabetes mellitus. Her disease course has been stable. Pertinent negatives for hypoglycemia include no confusion, dizziness, headaches, hunger, mood changes, nervousness/anxiousness, pallor, seizures, sleepiness, speech difficulty, sweats or tremors. Associated symptoms include chest pain. Pertinent negatives for diabetes include no blurred vision, no fatigue, no foot paresthesias, no foot ulcerations, no polydipsia, no polyphagia, no polyuria, no visual change, no weakness and no weight loss. There are no hypoglycemic complications. Symptoms are stable. There are no diabetic complications. Pertinent negatives for diabetic complications include no CVA, PVD or retinopathy. Risk factors for coronary artery disease include dyslipidemia and hypertension. Current diabetic treatment includes oral agent (monotherapy). She is compliant with treatment all of the time. Her weight is stable. She is following a generally healthy diet. Her breakfast blood glucose is taken between 8-9 am. Her breakfast blood glucose range is generally 110-130 mg/dl. An ACE inhibitor/angiotensin II receptor blocker is being taken. She does not see a podiatrist.Eye exam is current.  Insomnia  Primary symptoms: difficulty falling asleep, no malaise/fatigue.  The current episode started more than one year. The problem occurs every several days. The  problem has been waxing and waning since onset. Past treatments include medication. PMH includes: no depression.  No problem-specific Assessment & Plan notes found for this encounter.   Past Medical History:  Diagnosis Date  . Anxiety   . Aortic atherosclerosis ([place])   . Atherosclerotic heart disease   . Depression   . Diabetes mellitus without complication ([place])   . Diverticulosis of colon   . GERD (gastroesophageal reflux disease)   . Hiatal hernia   . History of heart artery stent   . History of shingles   . Hyperlipidemia   . Hypertension   . Sarcoidosis ([place])   . Vitamin D deficiency     Past Surgical History:  Procedure Laterality Date  . ABDOMINAL HYSTERECTOMY  1979  . APPENDECTOMY    . CARDIAC SURGERY      Family History  Problem Relation Age of Onset  . Diabetes Daughter     Social History   Social History  . Marital status: Single    Spouse name: N/A  . Number of children: N/A  . Years of education: N/A   Occupational History  . Not on file.   Social History Main Topics  . Smoking status: Former Smoker    Packs/day: 2.00    Years: 10.00    Types: Cigarettes    Quit date: 11/16/1988  . Smokeless tobacco: Never Used  . Alcohol use No  . Drug use: No  . Sexual activity: Not Currently   Other Topics Concern  . Not on file   Social History Narrative  . No narrative on file    Allergies  Allergen Reactions  . Ace Inhibitors     Other reaction(s): Cough  . Codeine Swelling  . Codeine Sulfate     Other reaction(s): Unknown  . Ezetimibe     Other reaction(s): Muscle Pain  . Other     Other reaction(s): Muscle Pain     Review of Systems  Constitutional: Negative for chills, fatigue, fever, malaise/fatigue and weight loss.  HENT: Negative for ear discharge, ear pain, hoarse voice and sore throat.   Eyes: Negative for blurred vision.  Respiratory: Negative for cough, sputum production, choking, shortness of breath and wheezing.    Cardiovascular: Positive for chest pain. Negative for palpitations, orthopnea, leg swelling and PND.  Gastrointestinal: Positive for heartburn. Negative for abdominal pain, blood in stool, constipation, diarrhea, dysphagia, melena and nausea.  Genitourinary: Negative for dysuria, frequency, hematuria and urgency.  Musculoskeletal: Negative for back pain, joint pain, myalgias, muscle weakness and neck pain.  Skin: Negative for pallor and rash.  Neurological: Negative for dizziness, tingling, tremors, sensory change, focal weakness, seizures, speech difficulty, weakness and headaches.  Endo/Heme/Allergies: Negative for environmental allergies, polydipsia and polyphagia. Does not bruise/bleed easily.  Psychiatric/Behavioral: Negative for confusion, depression and suicidal ideas. The patient has insomnia. The patient is not nervous/anxious.      Objective  Vitals:   08/24/16 1027  BP: 120/62  Pulse: 70  Weight: 151 lb (68.5 kg)  Height: 5\' 11"  (1.803 m)    Physical Exam  Constitutional: She is well-developed, well-nourished, and in no distress. No distress.  HENT:  Head: Normocephalic and atraumatic.  Right Ear: External ear normal.  Left Ear: External ear normal.  Nose: Nose normal.  Mouth/Throat: Oropharynx is clear and moist.  Eyes: Conjunctivae and EOM are normal. Pupils are equal, round, and reactive to light. Right eye exhibits no discharge. Left eye exhibits no discharge.  Neck: Normal range of motion. Neck supple. No JVD present. No thyromegaly present.  Cardiovascular: Normal rate, regular rhythm, normal heart sounds  and intact distal pulses.  Exam reveals no gallop and no friction rub.   No murmur heard. Pulmonary/Chest: Effort normal and breath sounds normal. She has no wheezes. She has no rales.  Abdominal: Soft. Bowel sounds are normal. She exhibits no mass. There is no tenderness. There is no rebound and no guarding.  Musculoskeletal: Normal range of motion. She  exhibits no edema.  Lymphadenopathy:    She has no cervical adenopathy.  Neurological: She is alert. She has normal reflexes.  Skin: Skin is warm and dry. She is not diaphoretic.  Psychiatric: Mood and affect normal.  Nursing note and vitals reviewed.     Assessment & Plan  Problem List Items Addressed This Visit      Cardiovascular and Mediastinum   Arteriosclerosis of coronary artery   Relevant Medications   pravastatin (PRAVACHOL) 40 MG tablet   amLODipine (NORVASC) 5 MG tablet   losartan (COZAAR) 50 MG tablet   Hypertension, benign - Primary   Relevant Medications   pravastatin (PRAVACHOL) 40 MG tablet   amLODipine (NORVASC) 5 MG tablet   losartan (COZAAR) 50 MG tablet   Other Relevant Orders   Renal Function Panel     Digestive   Gastro-esophageal reflux disease without esophagitis   Relevant Medications   omeprazole (PRILOSEC) 20 MG capsule     Endocrine   Type 2 diabetes mellitus, uncontrolled (HCC)   Relevant Medications   pravastatin (PRAVACHOL) 40 MG tablet   losartan (COZAAR) 50 MG tablet   sitaGLIPtin (JANUVIA) 100 MG tablet   Other Relevant Orders   Hemoglobin A1c     Nervous and Auditory   HZV (herpes zoster virus) post herpetic neuralgia   Relevant Medications   nortriptyline (PAMELOR) 10 MG capsule     Other   Dyslipidemia   Relevant Medications   pravastatin (PRAVACHOL) 40 MG tablet   Other Relevant Orders   Lipid Profile   Urge incontinence of urine   Relevant Medications   nortriptyline (PAMELOR) 10 MG capsule    Other Visit Diagnoses    Depression, unspecified depression type       Relevant Medications   nortriptyline (PAMELOR) 10 MG capsule        Dr. Deanna Jones [facility]  08/24/16

## 2016-08-25 LAB — LIPID PANEL
CHOL/HDL RATIO: 2.2 ratio (ref 0.0–4.4)
Cholesterol, Total: 197 mg/dL (ref 100–199)
HDL: 90 mg/dL (ref >39)
LDL Calculated: 98 mg/dL (ref 0–99)
Triglycerides: 47 mg/dL (ref 0–149)
VLDL CHOLESTEROL CAL: 9 mg/dL (ref 5–40)

## 2016-08-25 LAB — RENAL FUNCTION PANEL
ALBUMIN: 4.1 g/dL (ref 3.5–4.8)
BUN/Creatinine Ratio: 17 (ref 12–28)
BUN: 17 mg/dL (ref 8–27)
CHLORIDE: 101 mmol/L (ref 96–106)
CO2: 26 mmol/L (ref 18–29)
Calcium: 9.9 mg/dL (ref 8.7–10.3)
Creatinine, Ser: 1.03 mg/dL — ABNORMAL HIGH (ref 0.57–1.00)
GFR calc non Af Amer: 54 mL/min/{1.73_m2} — ABNORMAL LOW (ref >59)
GFR, EST AFRICAN AMERICAN: 62 mL/min/{1.73_m2} (ref >59)
GLUCOSE: 86 mg/dL (ref 65–99)
POTASSIUM: 4.3 mmol/L (ref 3.5–5.2)
Phosphorus: 3.3 mg/dL (ref 2.5–4.5)
SODIUM: 140 mmol/L (ref 134–144)

## 2016-08-25 LAB — HEMOGLOBIN A1C
ESTIMATED AVERAGE GLUCOSE: 146 mg/dL
HEMOGLOBIN A1C: 6.7 % — AB (ref 4.8–5.6)

## 2016-09-01 DIAGNOSIS — M955 Acquired deformity of pelvis: Secondary | ICD-10-CM | POA: Diagnosis not present

## 2016-09-01 DIAGNOSIS — M9903 Segmental and somatic dysfunction of lumbar region: Secondary | ICD-10-CM | POA: Diagnosis not present

## 2016-09-01 DIAGNOSIS — M546 Pain in thoracic spine: Secondary | ICD-10-CM | POA: Diagnosis not present

## 2016-09-01 DIAGNOSIS — M9905 Segmental and somatic dysfunction of pelvic region: Secondary | ICD-10-CM | POA: Diagnosis not present

## 2016-09-01 DIAGNOSIS — M9902 Segmental and somatic dysfunction of thoracic region: Secondary | ICD-10-CM | POA: Diagnosis not present

## 2016-09-01 DIAGNOSIS — M5441 Lumbago with sciatica, right side: Secondary | ICD-10-CM | POA: Diagnosis not present

## 2016-09-07 DIAGNOSIS — M9905 Segmental and somatic dysfunction of pelvic region: Secondary | ICD-10-CM | POA: Diagnosis not present

## 2016-09-07 DIAGNOSIS — H401132 Primary open-angle glaucoma, bilateral, moderate stage: Secondary | ICD-10-CM | POA: Diagnosis not present

## 2016-09-07 DIAGNOSIS — M9902 Segmental and somatic dysfunction of thoracic region: Secondary | ICD-10-CM | POA: Diagnosis not present

## 2016-09-07 DIAGNOSIS — M9903 Segmental and somatic dysfunction of lumbar region: Secondary | ICD-10-CM | POA: Diagnosis not present

## 2016-09-07 DIAGNOSIS — M546 Pain in thoracic spine: Secondary | ICD-10-CM | POA: Diagnosis not present

## 2016-09-07 DIAGNOSIS — M955 Acquired deformity of pelvis: Secondary | ICD-10-CM | POA: Diagnosis not present

## 2016-09-07 DIAGNOSIS — M5441 Lumbago with sciatica, right side: Secondary | ICD-10-CM | POA: Diagnosis not present

## 2016-09-07 DIAGNOSIS — H2511 Age-related nuclear cataract, right eye: Secondary | ICD-10-CM | POA: Diagnosis not present

## 2016-09-08 DIAGNOSIS — E118 Type 2 diabetes mellitus with unspecified complications: Secondary | ICD-10-CM | POA: Diagnosis not present

## 2016-09-08 DIAGNOSIS — I2510 Atherosclerotic heart disease of native coronary artery without angina pectoris: Secondary | ICD-10-CM | POA: Diagnosis not present

## 2016-09-08 DIAGNOSIS — I214 Non-ST elevation (NSTEMI) myocardial infarction: Secondary | ICD-10-CM | POA: Diagnosis not present

## 2016-09-08 DIAGNOSIS — E119 Type 2 diabetes mellitus without complications: Secondary | ICD-10-CM | POA: Diagnosis not present

## 2016-09-08 DIAGNOSIS — I10 Essential (primary) hypertension: Secondary | ICD-10-CM | POA: Diagnosis not present

## 2016-09-09 ENCOUNTER — Other Ambulatory Visit: Payer: Self-pay | Admitting: Pharmacist

## 2016-09-10 NOTE — Patient Outreach (Signed)
Ms. Garron was not able to show for her 3 month [facility] visit as her sister passed away. I have called Ms. Dibbern and left a message to call me to reschedule at her convenience.   Brandin Stetzer K. Dicky Doe, PharmD [facility] [phone]

## 2016-09-24 DIAGNOSIS — D2371 Other benign neoplasm of skin of right lower limb, including hip: Secondary | ICD-10-CM | POA: Diagnosis not present

## 2016-09-24 DIAGNOSIS — B351 Tinea unguium: Secondary | ICD-10-CM | POA: Diagnosis not present

## 2016-09-24 DIAGNOSIS — E119 Type 2 diabetes mellitus without complications: Secondary | ICD-10-CM | POA: Diagnosis not present

## 2016-09-24 DIAGNOSIS — M79672 Pain in left foot: Secondary | ICD-10-CM | POA: Diagnosis not present

## 2016-09-24 DIAGNOSIS — M79671 Pain in right foot: Secondary | ICD-10-CM | POA: Diagnosis not present

## 2016-10-02 ENCOUNTER — Inpatient Hospital Stay
Admission: EM | Admit: 2016-10-02 | Discharge: 2016-10-13 | DRG: 336 | Disposition: A | Discharge status: Home Health | Payer: 59 | Attending: Surgery | Admitting: Surgery

## 2016-10-02 ENCOUNTER — Emergency Department: Payer: 59

## 2016-10-02 ENCOUNTER — Encounter: Payer: Self-pay | Admitting: Family Medicine

## 2016-10-02 ENCOUNTER — Inpatient Hospital Stay: Payer: 59

## 2016-10-02 ENCOUNTER — Ambulatory Visit (INDEPENDENT_AMBULATORY_CARE_PROVIDER_SITE_OTHER): Payer: 59 | Admitting: Family Medicine

## 2016-10-02 VITALS — BP 70/52 | HR 104 | Temp 97.6°F | Ht 71.0 in | Wt 156.0 lb

## 2016-10-02 DIAGNOSIS — N179 Acute kidney failure, unspecified: Secondary | ICD-10-CM

## 2016-10-02 DIAGNOSIS — R2689 Other abnormalities of gait and mobility: Secondary | ICD-10-CM

## 2016-10-02 DIAGNOSIS — I10 Essential (primary) hypertension: Secondary | ICD-10-CM | POA: Diagnosis present

## 2016-10-02 DIAGNOSIS — K5730 Diverticulosis of large intestine without perforation or abscess without bleeding: Secondary | ICD-10-CM | POA: Diagnosis not present

## 2016-10-02 DIAGNOSIS — Z955 Presence of coronary angioplasty implant and graft: Secondary | ICD-10-CM

## 2016-10-02 DIAGNOSIS — K565 Intestinal adhesions [bands], unspecified as to partial versus complete obstruction: Secondary | ICD-10-CM | POA: Diagnosis not present

## 2016-10-02 DIAGNOSIS — E119 Type 2 diabetes mellitus without complications: Secondary | ICD-10-CM | POA: Diagnosis present

## 2016-10-02 DIAGNOSIS — K219 Gastro-esophageal reflux disease without esophagitis: Secondary | ICD-10-CM | POA: Diagnosis present

## 2016-10-02 DIAGNOSIS — I739 Peripheral vascular disease, unspecified: Secondary | ICD-10-CM | POA: Diagnosis not present

## 2016-10-02 DIAGNOSIS — Z87891 Personal history of nicotine dependence: Secondary | ICD-10-CM

## 2016-10-02 DIAGNOSIS — E785 Hyperlipidemia, unspecified: Secondary | ICD-10-CM | POA: Diagnosis present

## 2016-10-02 DIAGNOSIS — K56609 Unspecified intestinal obstruction, unspecified as to partial versus complete obstruction: Secondary | ICD-10-CM | POA: Diagnosis not present

## 2016-10-02 DIAGNOSIS — R109 Unspecified abdominal pain: Secondary | ICD-10-CM

## 2016-10-02 DIAGNOSIS — M6281 Muscle weakness (generalized): Secondary | ICD-10-CM

## 2016-10-02 DIAGNOSIS — K56699 Other intestinal obstruction unspecified as to partial versus complete obstruction: Secondary | ICD-10-CM | POA: Diagnosis not present

## 2016-10-02 DIAGNOSIS — Z79899 Other long term (current) drug therapy: Secondary | ICD-10-CM

## 2016-10-02 DIAGNOSIS — Z7982 Long term (current) use of aspirin: Secondary | ICD-10-CM

## 2016-10-02 DIAGNOSIS — K5792 Diverticulitis of intestine, part unspecified, without perforation or abscess without bleeding: Secondary | ICD-10-CM

## 2016-10-02 DIAGNOSIS — E1165 Type 2 diabetes mellitus with hyperglycemia: Secondary | ICD-10-CM

## 2016-10-02 DIAGNOSIS — Z9861 Coronary angioplasty status: Secondary | ICD-10-CM

## 2016-10-02 DIAGNOSIS — IMO0001 Reserved for inherently not codable concepts without codable children: Secondary | ICD-10-CM

## 2016-10-02 DIAGNOSIS — I959 Hypotension, unspecified: Secondary | ICD-10-CM

## 2016-10-02 DIAGNOSIS — Z7984 Long term (current) use of oral hypoglycemic drugs: Secondary | ICD-10-CM | POA: Diagnosis not present

## 2016-10-02 DIAGNOSIS — I700 Atherosclerosis of aorta: Secondary | ICD-10-CM | POA: Insufficient documentation

## 2016-10-02 DIAGNOSIS — R112 Nausea with vomiting, unspecified: Secondary | ICD-10-CM | POA: Diagnosis not present

## 2016-10-02 DIAGNOSIS — I2510 Atherosclerotic heart disease of native coronary artery without angina pectoris: Secondary | ICD-10-CM | POA: Diagnosis present

## 2016-10-02 DIAGNOSIS — K5732 Diverticulitis of large intestine without perforation or abscess without bleeding: Secondary | ICD-10-CM | POA: Diagnosis present

## 2016-10-02 DIAGNOSIS — Z833 Family history of diabetes mellitus: Secondary | ICD-10-CM | POA: Diagnosis not present

## 2016-10-02 HISTORY — DX: Diverticulitis of intestine, part unspecified, without perforation or abscess without bleeding: K57.92

## 2016-10-02 LAB — CBC WITH DIFFERENTIAL/PLATELET
Basophils Absolute: 0 10*3/uL (ref 0–0.1)
Basophils Relative: 1 %
Eosinophils Absolute: 0 10*3/uL (ref 0–0.7)
Eosinophils Relative: 0 %
HCT: 41.9 % (ref 35.0–47.0)
HEMOGLOBIN: 14.2 g/dL (ref 12.0–16.0)
LYMPHS ABS: 2.6 10*3/uL (ref 1.0–3.6)
LYMPHS PCT: 26 %
MCH: 30.5 pg (ref 26.0–34.0)
MCHC: 33.9 g/dL (ref 32.0–36.0)
MCV: 90.2 fL (ref 80.0–100.0)
Monocytes Absolute: 1.3 10*3/uL — ABNORMAL HIGH (ref 0.2–0.9)
Monocytes Relative: 13 %
NEUTROS ABS: 6 10*3/uL (ref 1.4–6.5)
NEUTROS PCT: 60 %
Platelets: 253 10*3/uL (ref 150–440)
RBC: 4.65 MIL/uL (ref 3.80–5.20)
RDW: 13.7 % (ref 11.5–14.5)
WBC: 9.9 10*3/uL (ref 3.6–11.0)

## 2016-10-02 LAB — URINALYSIS COMPLETE WITH MICROSCOPIC (ARMC ONLY)
Bilirubin Urine: NEGATIVE
GLUCOSE, UA: 50 mg/dL — AB
HGB URINE DIPSTICK: NEGATIVE
Nitrite: NEGATIVE
PH: 5 (ref 5.0–8.0)
PROTEIN: 30 mg/dL — AB
Specific Gravity, Urine: 1.024 (ref 1.005–1.030)

## 2016-10-02 LAB — COMPREHENSIVE METABOLIC PANEL
ALK PHOS: 67 U/L (ref 38–126)
ALT: 23 U/L (ref 14–54)
AST: 32 U/L (ref 15–41)
Albumin: 4 g/dL (ref 3.5–5.0)
Anion gap: 13 (ref 5–15)
BUN: 45 mg/dL — AB (ref 6–20)
CALCIUM: 10.1 mg/dL (ref 8.9–10.3)
CO2: 22 mmol/L (ref 22–32)
CREATININE: 1.56 mg/dL — AB (ref 0.44–1.00)
Chloride: 100 mmol/L — ABNORMAL LOW (ref 101–111)
GFR, EST AFRICAN AMERICAN: 37 mL/min — AB (ref >60)
GFR, EST NON AFRICAN AMERICAN: 32 mL/min — AB (ref >60)
Glucose, Bld: 181 mg/dL — ABNORMAL HIGH (ref 65–99)
Potassium: 3.6 mmol/L (ref 3.5–5.1)
SODIUM: 135 mmol/L (ref 135–145)
Total Bilirubin: 1.2 mg/dL (ref 0.3–1.2)
Total Protein: 6.9 g/dL (ref 6.5–8.1)

## 2016-10-02 LAB — TROPONIN I: TROPONIN I: 0.05 ng/mL — AB (ref <0.03)

## 2016-10-02 LAB — GLUCOSE, CAPILLARY: GLUCOSE-CAPILLARY: 165 mg/dL — AB (ref 65–99)

## 2016-10-02 LAB — LIPASE, BLOOD: Lipase: 22 U/L (ref 11–51)

## 2016-10-02 LAB — LACTIC ACID, PLASMA
LACTIC ACID, VENOUS: 3.2 mmol/L — AB (ref 0.5–1.9)
Lactic Acid, Venous: 1.3 mmol/L (ref 0.5–1.9)

## 2016-10-02 MED ORDER — METRONIDAZOLE IN NACL 5-0.79 MG/ML-% IV SOLN
500.0000 mg | Freq: Once | INTRAVENOUS | Status: AC
  Administered 2016-10-02: 500 mg via INTRAVENOUS
  Filled 2016-10-02: qty 100

## 2016-10-02 MED ORDER — SODIUM CHLORIDE 0.9 % IV SOLN
INTRAVENOUS | Status: DC
  Administered 2016-10-02 – 2016-10-04 (×6): via INTRAVENOUS

## 2016-10-02 MED ORDER — LATANOPROST 0.005 % OP SOLN
1.0000 [drp] | Freq: Every day | OPHTHALMIC | Status: DC
  Administered 2016-10-02 – 2016-10-12 (×11): 1 [drp] via OPHTHALMIC
  Filled 2016-10-02: qty 2.5

## 2016-10-02 MED ORDER — PIPERACILLIN-TAZOBACTAM 3.375 G IVPB 30 MIN
3.3750 g | Freq: Once | INTRAVENOUS | Status: AC
  Administered 2016-10-02: 3.375 g via INTRAVENOUS
  Filled 2016-10-02: qty 50

## 2016-10-02 MED ORDER — MORPHINE SULFATE (PF) 4 MG/ML IV SOLN
4.0000 mg | Freq: Once | INTRAVENOUS | Status: AC
  Administered 2016-10-02: 4 mg via INTRAVENOUS
  Filled 2016-10-02: qty 1

## 2016-10-02 MED ORDER — BRIMONIDINE TARTRATE 0.15 % OP SOLN
2.0000 [drp] | Freq: Every day | OPHTHALMIC | Status: DC
  Administered 2016-10-02: 2 [drp] via OPHTHALMIC
  Filled 2016-10-02: qty 5

## 2016-10-02 MED ORDER — PANTOPRAZOLE SODIUM 40 MG IV SOLR
40.0000 mg | Freq: Every day | INTRAVENOUS | Status: DC
  Administered 2016-10-02 – 2016-10-10 (×9): 40 mg via INTRAVENOUS
  Filled 2016-10-02 (×9): qty 40

## 2016-10-02 MED ORDER — IOPAMIDOL (ISOVUE-300) INJECTION 61%
100.0000 mL | Freq: Once | INTRAVENOUS | Status: AC | PRN
  Administered 2016-10-02: 80 mL via INTRAVENOUS

## 2016-10-02 MED ORDER — HYDRALAZINE HCL 20 MG/ML IJ SOLN
10.0000 mg | Freq: Four times a day (QID) | INTRAMUSCULAR | Status: DC | PRN
Start: 2016-10-02 — End: 2016-10-13
  Administered 2016-10-11 – 2016-10-12 (×2): 10 mg via INTRAVENOUS
  Filled 2016-10-02 (×2): qty 1

## 2016-10-02 MED ORDER — SODIUM CHLORIDE 0.9 % IV BOLUS (SEPSIS)
1000.0000 mL | Freq: Once | INTRAVENOUS | Status: AC
  Administered 2016-10-02: 1000 mL via INTRAVENOUS

## 2016-10-02 MED ORDER — NITROGLYCERIN 0.4 MG SL SUBL: 0.4000 mg | SUBLINGUAL_TABLET | SUBLINGUAL | Status: DC | PRN

## 2016-10-02 MED ORDER — ONDANSETRON HCL 4 MG/2ML IJ SOLN
4.0000 mg | Freq: Once | INTRAMUSCULAR | Status: AC
  Administered 2016-10-02: 4 mg via INTRAVENOUS
  Filled 2016-10-02: qty 2

## 2016-10-02 MED ORDER — INSULIN ASPART 100 UNIT/ML ~~LOC~~ SOLN
0.0000 [IU] | SUBCUTANEOUS | Status: DC
  Administered 2016-10-03 (×2): 2 [IU] via SUBCUTANEOUS
  Administered 2016-10-03: 3 [IU] via SUBCUTANEOUS
  Administered 2016-10-04 – 2016-10-12 (×12): 2 [IU] via SUBCUTANEOUS
  Administered 2016-10-13: 3 [IU] via SUBCUTANEOUS
  Filled 2016-10-02 (×6): qty 2
  Filled 2016-10-02: qty 3
  Filled 2016-10-02 (×7): qty 2
  Filled 2016-10-02: qty 3
  Filled 2016-10-02 (×2): qty 2

## 2016-10-02 MED ORDER — HEPARIN SODIUM (PORCINE) 5000 UNIT/ML IJ SOLN
5000.0000 [IU] | Freq: Three times a day (TID) | INTRAMUSCULAR | Status: DC
  Administered 2016-10-02 – 2016-10-13 (×31): 5000 [IU] via SUBCUTANEOUS
  Filled 2016-10-02 (×30): qty 1

## 2016-10-02 MED ORDER — HYDROMORPHONE HCL 1 MG/ML IJ SOLN
0.5000 mg | INTRAMUSCULAR | Status: DC | PRN
  Administered 2016-10-02 – 2016-10-03 (×5): 0.5 mg via INTRAVENOUS
  Filled 2016-10-02 (×5): qty 1

## 2016-10-02 MED ORDER — DEXTROSE 5 % IV SOLN: 2.0000 g | INTRAVENOUS | Status: DC

## 2016-10-02 MED ORDER — ONDANSETRON 4 MG PO TBDP
4.0000 mg | ORAL_TABLET | Freq: Four times a day (QID) | ORAL | Status: DC | PRN
  Administered 2016-10-09: 4 mg via ORAL
  Filled 2016-10-02: qty 1

## 2016-10-02 MED ORDER — PIPERACILLIN-TAZOBACTAM 3.375 G IVPB
3.3750 g | Freq: Three times a day (TID) | INTRAVENOUS | Status: DC
  Administered 2016-10-02 – 2016-10-09 (×20): 3.375 g via INTRAVENOUS
  Filled 2016-10-02 (×20): qty 50

## 2016-10-02 MED ORDER — ONDANSETRON HCL 4 MG/2ML IJ SOLN
4.0000 mg | Freq: Four times a day (QID) | INTRAMUSCULAR | Status: DC | PRN
  Administered 2016-10-08 – 2016-10-11 (×5): 4 mg via INTRAVENOUS
  Filled 2016-10-02 (×5): qty 2

## 2016-10-02 MED ORDER — IOPAMIDOL (ISOVUE-300) INJECTION 61%
30.0000 mL | Freq: Once | INTRAVENOUS | Status: AC | PRN
  Administered 2016-10-02: 30 mL via ORAL

## 2016-10-02 MED ORDER — METRONIDAZOLE IN NACL 5-0.79 MG/ML-% IV SOLN: 500.0000 mg | Freq: Three times a day (TID) | INTRAVENOUS | Status: DC

## 2016-10-02 NOTE — ED Notes (Signed)
Pt IV no longer working when CT attempted to use contrast. Pt returned from CT, MD will get ultrasound IV.

## 2016-10-02 NOTE — ED Provider Notes (Signed)
[facility] Emergency Department Provider Note  ____________________________________________  Time seen: Approximately 11:49 AM  I have reviewed the triage vital signs and the nursing notes.   HISTORY  Chief Complaint Abdominal Pain   HPI Breanna Petty is a 75 y.o. female with a history of CAD, diabetes, diverticulosis, hypertension, hyperlipidemia, sarcoidosis who presents for evaluation of abdominal pain. Patient reports 4 days of left-sided abdominal pain initially associated with multiple episodes of nonbloody nonbilious emesis, nausea, and diarrhea. She reports that the diarrhea and the vomiting have resolved for the last 3 days. She has had decreased by mouth intake due to nausea. Patient also endorses chills and is unsure if she had a fever. No chest pain or shortness of breath, no coughing, no sore throat, no dysuria or hematuria. Patient drove herself to urgent care earlier today and was sent to the emergency room for evaluation. She reports that her pain is a 9 out of 10, dull, located in the left upper and lower quadrants, constant, nonradiating. She denies ever having similar pain.  Past Medical History:  Diagnosis Date  . Anxiety   . Aortic atherosclerosis ([place])   . Atherosclerotic heart disease   . Depression   . Diabetes mellitus without complication ([place])   . Diverticulosis of colon   . GERD (gastroesophageal reflux disease)   . Hiatal hernia   . History of heart artery stent   . History of shingles   . Hyperlipidemia   . Hypertension   . Sarcoidosis ([place])   . Vitamin D deficiency     Patient Active Problem List   Diagnosis Date Noted  . Aortic atherosclerosis ([place]) 10/02/2016  . Diverticulosis of large intestine without hemorrhage 10/02/2016  . Hammer toe of right foot 10/16/2015  . Nail deformity 10/16/2015  . Chronic obstructive pulmonary disease ([place]) 09/04/2015  . History of MI (myocardial infarction) 09/04/2015  .  Spondylolisthesis at L4-L5 level 07/11/2015  . DDD (degenerative disc disease), lumbar 07/11/2015  . History of sarcoidosis 06/04/2015  . Urge incontinence of urine 06/04/2015  . Hearing loss of both ears 05/07/2015  . Anxiety and depression 05/04/2015  . Arteriosclerosis of coronary artery 05/04/2015  . Type 2 diabetes mellitus, uncontrolled ([place]) 05/04/2015  . Dyslipidemia 05/04/2015  . Hypertension, benign 05/04/2015  . Gastro-esophageal reflux disease without esophagitis 05/04/2015  . Hiatal hernia 05/04/2015  . HZV (herpes zoster virus) post herpetic neuralgia 05/04/2015  . Vitamin D deficiency 05/04/2015  . S/P coronary artery stent placement 05/04/2015    Past Surgical History:  Procedure Laterality Date  . ABDOMINAL HYSTERECTOMY  1979  . APPENDECTOMY    . CARDIAC SURGERY      Prior to Admission medications   Medication Sig Start Date End Date Taking? Authorizing Provider  aspirin EC 81 MG tablet Take 1 tablet by mouth daily.   Yes Historical Provider, MD  brimonidine (ALPHAGAN P) 0.1 % SOLN 2 drops Nightly.   Yes Historical Provider, MD  latanoprost (XALATAN) 0.005 % ophthalmic solution 2 drops at bedtime.   Yes Historical Provider, MD  losartan (COZAAR) 50 MG tablet Take 1 tablet (50 mg total) by mouth daily. 08/24/16  Yes Juline Patch, MD  nortriptyline (PAMELOR) 10 MG capsule Take 1 capsule (10 mg total) by mouth at bedtime. 08/24/16  Yes Juline Patch, MD  omeprazole (PRILOSEC) 20 MG capsule Take 1 capsule (20 mg total) by mouth 2 (two) times daily. 08/24/16  Yes Juline Patch, MD  pravastatin (PRAVACHOL) 40  MG tablet Take 1 tablet (40 mg total) by mouth daily. 08/24/16  Yes Juline Patch, MD  sitaGLIPtin (JANUVIA) 100 MG tablet Take 1 tablet (100 mg total) by mouth daily. 08/24/16  Yes Juline Patch, MD  amLODipine (NORVASC) 5 MG tablet Take 1 tablet (5 mg total) by mouth daily. 08/24/16   Juline Patch, MD  BAYER CONTOUR NEXT TEST test strip TEST 2 TIMES A DAY 12/26/15    Steele Sizer, MD  NITROSTAT 0.4 MG SL tablet DISSOLVE ONE TABLET UNDER TONGUE AS NEED FOR CHEST PAIN AS DIRECTED 11/28/15   Steele Sizer, MD    Allergies Ace inhibitors; Codeine; Codeine sulfate; Ezetimibe; and Other  Family History  Problem Relation Age of Onset  . Diabetes Daughter     Social History Social History  Substance Use Topics  . Smoking status: Former Smoker    Packs/day: 2.00    Years: 10.00    Types: Cigarettes    Quit date: 11/16/1988  . Smokeless tobacco: Never Used  . Alcohol use No    Review of Systems  Constitutional: Negative for fever. + chills Eyes: Negative for visual changes. ENT: Negative for sore throat. Neck: No neck pain  Cardiovascular: Negative for chest pain. Respiratory: Negative for shortness of breath. Gastrointestinal: + left sided abdominal pain, vomiting and diarrhea. Genitourinary: Negative for dysuria. Musculoskeletal: Negative for back pain. Skin: Negative for rash. Neurological: Negative for headaches, weakness or numbness. Psych: No SI or HI  ____________________________________________   PHYSICAL EXAM:  VITAL SIGNS: Vitals:   10/02/16 1430 10/02/16 1607  BP: (!) 197/83 (!) 155/70  Pulse: 85 82  Resp: 17 18  Temp:     Constitutional: Alert and oriented. Well appearing and in no apparent distress. HEENT:      Head: Normocephalic and atraumatic.         Eyes: Conjunctivae are normal. Sclera is non-icteric. EOMI. PERRL      Mouth/Throat: Mucous membranes are moist.       Neck: Supple with no signs of meningismus. Cardiovascular: Regular rate and rhythm. No murmurs, gallops, or rubs. 2+ symmetrical distal pulses are present in all extremities. No JVD. Respiratory: Normal respiratory effort. Lungs are clear to auscultation bilaterally. No wheezes, crackles, or rhonchi.  Gastrointestinal: Soft, ttp over the LU and LLQ with localized guarding, non distended with positive bowel sounds. No rebound. Musculoskeletal:  Nontender with normal range of motion in all extremities. No edema, cyanosis, or erythema of extremities. Neurologic: Normal speech and language. Face is symmetric. Moving all extremities. No gross focal neurologic deficits are appreciated. Skin: Skin is warm, dry and intact. No rash noted. Psychiatric: Mood and affect are normal. Speech and behavior are normal.  ____________________________________________   LABS (all labs ordered are listed, but only abnormal results are displayed)  Labs Reviewed  CBC WITH DIFFERENTIAL/PLATELET - Abnormal; Notable for the following:       Result Value   Monocytes Absolute 1.3 (*)    All other components within normal limits  COMPREHENSIVE METABOLIC PANEL - Abnormal; Notable for the following:    Chloride 100 (*)    Glucose, Bld 181 (*)    BUN 45 (*)    Creatinine, Ser 1.56 (*)    GFR calc non Af Amer 32 (*)    GFR calc Af Amer 37 (*)    All other components within normal limits  LACTIC ACID, PLASMA - Abnormal; Notable for the following:    Lactic Acid, Venous 3.2 (*)  All other components within normal limits  URINALYSIS COMPLETEWITH MICROSCOPIC ([facility] ONLY) - Abnormal; Notable for the following:    Color, Urine YELLOW (*)    APPearance CLEAR (*)    Glucose, UA 50 (*)    Ketones, ur TRACE (*)    Protein, ur 30 (*)    Leukocytes, UA 2+ (*)    Bacteria, UA RARE (*)    Squamous Epithelial / LPF 0-5 (*)    All other components within normal limits  TROPONIN I - Abnormal; Notable for the following:    Troponin I 0.05 (*)    All other components within normal limits  URINE CULTURE  CULTURE, BLOOD (ROUTINE X 2)  CULTURE, BLOOD (ROUTINE X 2)  LIPASE, BLOOD  LACTIC ACID, PLASMA   ____________________________________________  EKG  ED ECG REPORT I, Rudene Re, the attending physician, personally viewed and interpreted this ECG.  Normal sinus rhythm, rate of 86, normal intervals, normal axis, no ST elevations or depressions.    ____________________________________________  RADIOLOGY  CT a/p: Multiple loops of small bowel dilatation with transition zones in the mid to lower right abdomen with suggestion of swirl sign. These findings are felt to represent a closed loop type small bowel obstruction in the right abdomen.  Focal area of apparent diverticulitis in the distal descending colon as described.  No free air. No abscess. No renal or ureteral calculus. No hydronephrosis.  There is aortoiliac atherosclerosis. There are foci of coronary artery calcification. There is a small hiatal hernia. ____________________________________________   PROCEDURES  Procedure(s) performed: None Procedures Critical Care performed: yes  CRITICAL CARE Performed by: Rudene Re  ?  Total critical care time: 35 min  Critical care time was exclusive of separately billable procedures and treating other patients.  Critical care was necessary to treat or prevent imminent or life-threatening deterioration.  Critical care was time spent personally by me on the following activities: development of treatment plan with patient and/or surrogate as well as nursing, discussions with consultants, evaluation of patient's response to treatment, examination of patient, obtaining history from patient or surrogate, ordering and performing treatments and interventions, ordering and review of laboratory studies, ordering and review of radiographic studies, pulse oximetry and re-evaluation of patient's condition.  ____________________________________________   INITIAL IMPRESSION / ASSESSMENT AND PLAN / ED COURSE   75 y.o. female with a history of CAD, diabetes, diverticulosis, hypertension, hyperlipidemia, sarcoidosis who presents for evaluation of 4 days of constant left sided abdominal pain initially associated with vomiting and diarrhea which have now resolved. Patient is well-appearing, in no distress, she has normal vital  signs, her abdomen is soft and tender to palpation on the left upper and lower quadrants with localized guarding. Give her IV fluids, IV Zofran, IV morphine for her symptoms. Plan for CBC, CMP, lipase, lactate, urinalysis. We'll get CT abdomen and pelvis.  Clinical Course    CT concerning for closed loop SBO and diverticulitis. Patient was given zosyn and flagyl. Repeat lactate normalized. Patient admitted to surgery, Dr. Adonis Huguenin.  Pertinent labs & imaging results that were available during my care of the patient were reviewed by me and considered in my medical decision making (see chart for details).    ____________________________________________   FINAL CLINICAL IMPRESSION(S) / ED DIAGNOSES  Final diagnoses:  SBO (small bowel obstruction)  Diverticulitis of intestine without perforation or abscess without bleeding, unspecified part of intestinal tract  AKI (acute kidney injury) ([place])      NEW MEDICATIONS STARTED DURING THIS VISIT:  New Prescriptions   No medications on file     Note:  This document was prepared using Dragon voice recognition software and may include unintentional dictation errors.    Rudene Re, MD 10/02/16 [phone]

## 2016-10-02 NOTE — Progress Notes (Signed)
EMER GLANDON    [id number]   Jan 18, 1941  Type of procedure: CT abd/pelvis  10/02/2016  During your examination at [facility] some of the x-ray dye leaked into the tissues around the vein that the x-ray dye was given through.  What should you do?  1. Apply ice 20 minutes four times a day for three days.  2. Keep the affected extremity elevated above the rest of your body for 48 hours.  3. If you develop any of the following signs or symptoms, please contact Radiology at [phone].  Increased pain  Increasing swelling   Change in sensation (ex. Numbness, tingling)  Development of redness or increase in warmth around the affected area  Increasing hardness  Blistering   I have read and understand these instructions.  Any questions I raised have been discussed to my satisfaction.  I understand that failure to follow these instructions may result in additional complications.

## 2016-10-02 NOTE — ED Notes (Signed)
Critical troponin and lactic acid reported to MD.

## 2016-10-02 NOTE — ED Notes (Addendum)
Pts ultrasound IV extravasated. Pts right arm elevated and ice applied. Pt refusing to be stuck anymore.Pt still has working 24G in left hand.

## 2016-10-02 NOTE — Progress Notes (Signed)
Name: Breanna Petty   MRN: [id number]    DOB: 06-17-1941   Date:10/02/2016       Progress Note  Subjective  Chief Complaint  Chief Complaint  Patient presents with  . Abdominal Pain    started Monday with nausea and vomiting- "couldn't get out of the bed". "Haven't thrown up since Tuesday, trying to drink gatorade and eating soup, but only had cream potatoes and potato soup yesterday" Having the abdominal pain in the middle of abdomen- "can't eat or drink but a little at a time because I feel full"     Abdominal Pain  This is a new problem. The current episode started in the past 7 days (Monday). The onset quality is sudden. The problem occurs constantly. The problem has been gradually worsening. The pain is located in the LLQ. Associated symptoms include nausea and vomiting. Pertinent negatives include no constipation, diarrhea, dysuria, fever, frequency, headaches, hematuria, melena, myalgias or weight loss.    No problem-specific Assessment & Plan notes found for this encounter.   Past Medical History:  Diagnosis Date  . Anxiety   . Aortic atherosclerosis ([place])   . Atherosclerotic heart disease   . Depression   . Diabetes mellitus without complication ([place])   . Diverticulosis of colon   . GERD (gastroesophageal reflux disease)   . Hiatal hernia   . History of heart artery stent   . History of shingles   . Hyperlipidemia   . Hypertension   . Sarcoidosis ([place])   . Vitamin D deficiency     Past Surgical History:  Procedure Laterality Date  . ABDOMINAL HYSTERECTOMY  1979  . APPENDECTOMY    . CARDIAC SURGERY      Family History  Problem Relation Age of Onset  . Diabetes Daughter     Social History   Social History  . Marital status: Single    Spouse name: N/A  . Number of children: N/A  . Years of education: N/A   Occupational History  . Not on file.   Social History Main Topics  . Smoking status: Former Smoker    Packs/day: 2.00    Years: 10.00     Types: Cigarettes    Quit date: 11/16/1988  . Smokeless tobacco: Never Used  . Alcohol use No  . Drug use: No  . Sexual activity: Not Currently   Other Topics Concern  . Not on file   Social History Narrative  . No narrative on file    Allergies  Allergen Reactions  . Ace Inhibitors     Other reaction(s): Cough  . Codeine Swelling  . Codeine Sulfate     Other reaction(s): Unknown  . Ezetimibe     Other reaction(s): Muscle Pain  . Other     Other reaction(s): Muscle Pain     Review of Systems  Constitutional: Negative for chills, fever, malaise/fatigue and weight loss.  HENT: Negative for ear discharge, ear pain and sore throat.   Eyes: Negative for blurred vision.  Respiratory: Negative for cough, sputum production, shortness of breath and wheezing.   Cardiovascular: Negative for chest pain, palpitations and leg swelling.  Gastrointestinal: Positive for abdominal pain, nausea and vomiting. Negative for blood in stool, constipation, diarrhea, heartburn and melena.  Genitourinary: Negative for dysuria, frequency, hematuria and urgency.  Musculoskeletal: Negative for back pain, joint pain, myalgias and neck pain.  Skin: Negative for rash.  Neurological: Negative for dizziness, tingling, sensory change, focal weakness and headaches.  Endo/Heme/Allergies: Negative for  environmental allergies and polydipsia. Does not bruise/bleed easily.  Psychiatric/Behavioral: Negative for depression and suicidal ideas. The patient is not nervous/anxious and does not have insomnia.      Objective  Vitals:   10/02/16 1022  BP: (!) 70/52  Pulse: (!) 104  Temp: 97.6 F (36.4 C)  TempSrc: Oral  Weight: 156 lb (70.8 kg)  Height: 5\' 11"  (1.803 m)    Physical Exam  Constitutional: She is well-developed, well-nourished, and in no distress. No distress.  HENT:  Head: Normocephalic and atraumatic.  Right Ear: External ear normal.  Left Ear: External ear normal.  Nose: Nose normal.   Mouth/Throat: Oropharynx is clear and moist. Mucous membranes are pale, dry and not cyanotic.  Eyes: Conjunctivae and EOM are normal. Pupils are equal, round, and reactive to light. Right eye exhibits no discharge. Left eye exhibits no discharge.  Neck: Normal range of motion. Neck supple. No JVD present. No thyromegaly present.  Cardiovascular: Normal rate, regular rhythm, normal heart sounds and intact distal pulses.  Exam reveals no gallop and no friction rub.   No murmur heard. Pulmonary/Chest: Effort normal and breath sounds normal. She has no wheezes. She has no rales.  Abdominal: Soft. Bowel sounds are normal. She exhibits no distension and no mass. There is no hepatosplenomegaly. There is tenderness in the left lower quadrant. There is rebound and guarding. There is no CVA tenderness.  Musculoskeletal: Normal range of motion. She exhibits no edema.  Lymphadenopathy:       Head (right side): No submental and no submandibular adenopathy present.       Head (left side): No submental and no submandibular adenopathy present.    She has no cervical adenopathy.  Neurological: She is alert. She has normal reflexes.  Skin: Skin is warm and dry. She is not diaphoretic.  Psychiatric: Mood and affect normal.  Nursing note and vitals reviewed.     Assessment & Plan  Problem List Items Addressed This Visit      Cardiovascular and Mediastinum   Aortic atherosclerosis ([place])     Digestive   Diverticulosis of large intestine without hemorrhage     Endocrine   Type 2 diabetes mellitus, uncontrolled ([place])     Other   S/P coronary artery stent placement    Other Visit Diagnoses    Acute abdominal pain    -  Primary   ? perforation of previous noted diverticula   Hypotension, unspecified hypotension type       dehydrotion/doubt sepsis        Dr. [facility]  10/02/16

## 2016-10-02 NOTE — ED Notes (Signed)
Pt transported to CT ?

## 2016-10-02 NOTE — ED Notes (Signed)
Called floor to let them know patient on the way 

## 2016-10-02 NOTE — Progress Notes (Signed)
This patient has received 75 ml's of IV ISOVUE 300 (type of contrast) contrast extravasation into RIGHT ARM (part of body) during a abd/pelvis CT exam.  The exam was performed on (date) 10/02/16  Site / affected area assessed by ER doc/nurse

## 2016-10-02 NOTE — H&P (Signed)
Date of Admission:  10/02/2016  Reason for Admission:  Acute diverticulitis and Small bowel obstruction  History of Present Illness: Breanna ALAIYNA Petty is a 75 y.o. female who presents with a five-day history of abdominal pain. Patient reports she started having some mild pain on the left side on 11/13 and then the next day became very severe. This was associated with episodes of nausea and vomiting on that same day. Since then she has remained nauseous but has not had any other episodes of emesis. That was also the last day that she had flatus or bowel movement as well. There was no blood in the stool at that point. She reports that her pain is not constant but intermittently worse in waves. She denies having any fevers but does believe she had some chills earlier in the week. She presented today for further evaluation as she continues to have pain on her left side. He does note that her abdomen is more distended but otherwise denies any chest pain or shortness of breath, dysuria or hematuria.  Past Medical History: Past Medical History:  Diagnosis Date  . Anxiety   . Aortic atherosclerosis ([place])   . Atherosclerotic heart disease   . Depression   . Diabetes mellitus without complication ([place])   . Diverticulosis of colon   . GERD (gastroesophageal reflux disease)   . Hiatal hernia   . History of heart artery stent   . History of shingles   . Hyperlipidemia   . Hypertension   . Sarcoidosis ([place])   . Vitamin D deficiency      Past Surgical History: Past Surgical History:  Procedure Laterality Date  . ABDOMINAL HYSTERECTOMY  1979  . APPENDECTOMY    . CARDIAC SURGERY      Home Medications: Prior to Admission medications   Medication Sig Start Date End Date Taking? Authorizing Provider  aspirin EC 81 MG tablet Take 1 tablet by mouth daily.   Yes Historical Provider, MD  brimonidine (ALPHAGAN P) 0.1 % SOLN 2 drops Nightly.   Yes Historical Provider, MD  latanoprost (XALATAN) 0.005 %  ophthalmic solution 2 drops at bedtime.   Yes Historical Provider, MD  losartan (COZAAR) 50 MG tablet Take 1 tablet (50 mg total) by mouth daily. 08/24/16  Yes Juline Patch, MD  nortriptyline (PAMELOR) 10 MG capsule Take 1 capsule (10 mg total) by mouth at bedtime. 08/24/16  Yes Juline Patch, MD  omeprazole (PRILOSEC) 20 MG capsule Take 1 capsule (20 mg total) by mouth 2 (two) times daily. 08/24/16  Yes Juline Patch, MD  pravastatin (PRAVACHOL) 40 MG tablet Take 1 tablet (40 mg total) by mouth daily. 08/24/16  Yes Juline Patch, MD  sitaGLIPtin (JANUVIA) 100 MG tablet Take 1 tablet (100 mg total) by mouth daily. 08/24/16  Yes Juline Patch, MD  amLODipine (NORVASC) 5 MG tablet Take 1 tablet (5 mg total) by mouth daily. 08/24/16   Juline Patch, MD  BAYER CONTOUR NEXT TEST test strip TEST 2 TIMES A DAY 12/26/15   Steele Sizer, MD  NITROSTAT 0.4 MG SL tablet DISSOLVE ONE TABLET UNDER TONGUE AS NEED FOR CHEST PAIN AS DIRECTED 11/28/15   Steele Sizer, MD    Allergies: Allergies  Allergen Reactions  . Ace Inhibitors     Other reaction(s): Cough  . Codeine Swelling  . Codeine Sulfate     Other reaction(s): Unknown  . Ezetimibe     Other reaction(s): Muscle Pain  . Other  Other reaction(s): Muscle Pain    Social History:  reports that she quit smoking about 27 years ago. Her smoking use included Cigarettes. She has a 20.00 pack-year smoking history. She has never used smokeless tobacco. She reports that she does not drink alcohol or use drugs.   Family History: Family History  Problem Relation Age of Onset  . Diabetes Daughter     Review of Systems: Review of Systems  Constitutional: Positive for chills. Negative for fever.  HENT: Negative for hearing loss.   Eyes: Negative for blurred vision.  Respiratory: Negative for cough and shortness of breath.   Cardiovascular: Negative for chest pain and leg swelling.  Gastrointestinal: Positive for abdominal pain, nausea and  vomiting. Negative for blood in stool, constipation, diarrhea and heartburn.  Genitourinary: Negative for dysuria and hematuria.  Musculoskeletal: Negative for myalgias.  Skin: Negative for rash.  Neurological: Negative for dizziness.  Psychiatric/Behavioral: Negative for depression.  All other systems reviewed and are negative.   Physical Exam BP (!) 169/66   Pulse 78   Temp 97.9 F (36.6 C) (Oral)   Resp 16   Ht 5\' 8"  (1.727 m)   Wt 70.8 kg (156 lb)   SpO2 99%   BMI 23.72 kg/m  CONSTITUTIONAL: No acute distress HEENT:  Normocephalic, atraumatic, extraocular motion intact. NECK: Trachea is midline, and there is no jugular venous distension. RESPIRATORY:  Lungs are clear, and breath sounds are equal bilaterally. Normal respiratory effort without pathologic use of accessory muscles. CARDIOVASCULAR: Heart is regular without murmurs, gallops, or rubs. GI: The abdomen is soft, mildly distended, with mild tenderness to palpation over the left lower quadrant particularly with also some tenderness on the left upper quadrant. No peritoneal signs at this point. There were no palpable masses. MUSCULOSKELETAL:  Normal muscle strength and tone in all four extremities.  No peripheral edema or cyanosis. SKIN: Skin turgor is normal. There are no pathologic skin lesions.  NEUROLOGIC:  Motor and sensation is grossly normal.  Cranial nerves are grossly intact. PSYCH:  Alert and oriented to person, place and time. Affect is normal.  Laboratory Analysis: Results for orders placed or performed during the hospital encounter of 10/02/16 (from the past 24 hour(s))  CBC with Differential/Platelet     Status: Abnormal   Collection Time: 10/02/16 11:48 AM  Result Value Ref Range   WBC 9.9 3.6 - 11.0 K/uL   RBC 4.65 3.80 - 5.20 MIL/uL   Hemoglobin 14.2 12.0 - 16.0 g/dL   HCT 41.9 35.0 - 47.0 %   MCV 90.2 80.0 - 100.0 fL   MCH 30.5 26.0 - 34.0 pg   MCHC 33.9 32.0 - 36.0 g/dL   RDW 13.7 11.5 - 14.5 %    Platelets 253 150 - 440 K/uL   Neutrophils Relative % 60 %   Neutro Abs 6.0 1.4 - 6.5 K/uL   Lymphocytes Relative 26 %   Lymphs Abs 2.6 1.0 - 3.6 K/uL   Monocytes Relative 13 %   Monocytes Absolute 1.3 (H) 0.2 - 0.9 K/uL   Eosinophils Relative 0 %   Eosinophils Absolute 0.0 0 - 0.7 K/uL   Basophils Relative 1 %   Basophils Absolute 0.0 0 - 0.1 K/uL  Comprehensive metabolic panel     Status: Abnormal   Collection Time: 10/02/16 11:48 AM  Result Value Ref Range   Sodium 135 135 - 145 mmol/L   Potassium 3.6 3.5 - 5.1 mmol/L   Chloride 100 (L) 101 - 111 mmol/L  CO2 22 22 - 32 mmol/L   Glucose, Bld 181 (H) 65 - 99 mg/dL   BUN 45 (H) 6 - 20 mg/dL   Creatinine, Ser 1.56 (H) 0.44 - 1.00 mg/dL   Calcium 10.1 8.9 - 10.3 mg/dL   Total Protein 6.9 6.5 - 8.1 g/dL   Albumin 4.0 3.5 - 5.0 g/dL   AST 32 15 - 41 U/L   ALT 23 14 - 54 U/L   Alkaline Phosphatase 67 38 - 126 U/L   Total Bilirubin 1.2 0.3 - 1.2 mg/dL   GFR calc non Af Amer 32 (L) >60 mL/min   GFR calc Af Amer 37 (L) >60 mL/min   Anion gap 13 5 - 15  Lipase, blood     Status: None   Collection Time: 10/02/16 11:48 AM  Result Value Ref Range   Lipase 22 11 - 51 U/L  Lactic acid, plasma     Status: Abnormal   Collection Time: 10/02/16 11:48 AM  Result Value Ref Range   Lactic Acid, Venous 3.2 (HH) 0.5 - 1.9 mmol/L  Urinalysis complete, with microscopic ([facility] only)     Status: Abnormal   Collection Time: 10/02/16 11:48 AM  Result Value Ref Range   Color, Urine YELLOW (A) YELLOW   APPearance CLEAR (A) CLEAR   Glucose, UA 50 (A) NEGATIVE mg/dL   Bilirubin Urine NEGATIVE NEGATIVE   Ketones, ur TRACE (A) NEGATIVE mg/dL   Specific Gravity, Urine 1.024 1.005 - 1.030   Hgb urine dipstick NEGATIVE NEGATIVE   pH 5.0 5.0 - 8.0   Protein, ur 30 (A) NEGATIVE mg/dL   Nitrite NEGATIVE NEGATIVE   Leukocytes, UA 2+ (A) NEGATIVE   RBC / HPF 0-5 0 - 5 RBC/hpf   WBC, UA 0-5 0 - 5 WBC/hpf   Bacteria, UA RARE (A) NONE SEEN   Squamous  Epithelial / LPF 0-5 (A) NONE SEEN   Mucous PRESENT    Hyaline Casts, UA PRESENT   Troponin I     Status: Abnormal   Collection Time: 10/02/16 11:48 AM  Result Value Ref Range   Troponin I 0.05 (HH) <0.03 ng/mL  Lactic acid, plasma     Status: None   Collection Time: 10/02/16  3:50 PM  Result Value Ref Range   Lactic Acid, Venous 1.3 0.5 - 1.9 mmol/L  Troponin I     Status: None   Collection Time: 10/02/16  7:32 PM  Result Value Ref Range   Troponin I <0.03 <0.03 ng/mL    Imaging: Ct Abdomen Pelvis W Contrast  Result Date: 10/02/2016 CLINICAL DATA:  Abdominal pain for 4 days.  Nausea and vomiting EXAM: CT ABDOMEN AND PELVIS WITH CONTRAST TECHNIQUE: Multidetector CT imaging of the abdomen and pelvis was performed using the standard protocol following bolus administration of intravenous contrast. Oral contrast was also administered. CONTRAST:  20mL ISOVUE-300 IOPAMIDOL (ISOVUE-300) INJECTION 61% COMPARISON:  June 08, 2016 FINDINGS: Lower chest: There is scarring in the lung bases. There are foci of coronary artery calcification. There is a small hiatal hernia. Hepatobiliary: There are no focal liver lesions evident. There is sludge in the gallbladder. The gallbladder wall does not appear appreciably thickened. There is no appreciable biliary duct dilatation. Pancreas: No pancreatic mass or inflammatory focus is evident. Spleen: No splenic lesions are evident. There is calcification splenic artery near the splenic hilum. Adrenals/Urinary Tract: There is a 9 mm adrenal adenoma on the left. Right adrenal appears normal. There is a parapelvic cyst in the left  kidney measuring 2.0 x 1.7 cm. There is no hydronephrosis on either side. There is no renal or ureteral calculus on either side. Urinary bladder is midline with wall thickness within normal limits. Stomach/Bowel: There are dilated loops of small bowel throughout the abdomen and pelvis with fluid throughout this bowel. There are scattered  air-fluid levels throughout the bowel. There is a transition zone in the distal ileal region just to the right of midline, best seen on axial slice 36 series 3 indicative of a degree of small bowel obstruction distally. A second transition zone is noted in the pelvis involving a second loop of ileum, best seen on axial slice 63 series 3. This appearance of multiple transition zones raises concern for closed loop type obstruction. There is a suggestion of a swirl sign in the right mid to lower abdomen as well. There is no evident free air or portal venous air. There are scattered diverticula throughout the colon. There is a localized area of fluid and mesenteric thickening in the left lower quadrant in the distal descending colon region, felt to represent localized area of diverticulitis. This finding is best appreciated on axial slices 44 through 49, series 3 and coronal slices 43 through 55 series 6. There is no abscess or perforation in this area. Vascular/Lymphatic: There is atherosclerotic calcification in the aorta and common iliac arteries. There is also calcification in the hypogastric arteries. There is no abdominal aortic aneurysm. Major mesenteric vessels appear patent. There are scattered small mesenteric lymph nodes throughout the abdomen but no frank adenopathy by size criteria. Reproductive: Uterus is absent. There is no pelvic mass or pelvic fluid collection. Other: Appendix is absent.  No abscess seen.  No ascites. Musculoskeletal: There is degenerative change in the lumbar spine. No blastic or lytic bone lesions. No intramuscular or abdominal wall lesions. IMPRESSION: Multiple loops of small bowel dilatation with transition zones in the mid to lower right abdomen with suggestion of swirl sign. These findings are felt to represent a closed loop type small bowel obstruction in the right abdomen. Focal area of apparent diverticulitis in the distal descending colon as described. No free air. No abscess.  No renal or ureteral calculus. No hydronephrosis. There is aortoiliac atherosclerosis. There are foci of coronary artery calcification. There is a small hiatal hernia. Critical Value/emergent results were called by telephone at the time of interpretation on 10/02/2016 at 5:17 pm to Dr. Rudene Re , who verbally acknowledged these results. Electronically Signed   By: Lowella Grip III M.D.   On: 10/02/2016 17:19    Assessment and Plan: This is a 75 y.o. female who presents with an episode of acute diverticulitis as well as small bowel obstruction. I have personally reviewed the patient's laboratory studies and imaging studies and have discussed them with the patient as well. Currently the patient is not peritoneal with a normal white blood cell count and a normalized lactic acid. There is no acute need for surgical intervention at this point but the patient will be admitted to the surgery service for further management.  She will have an NG tube placed and will be nothing by mouth with IV fluid hydration. She will be starting IV antibiotics for her acute diverticulitis as well. We will restart her essential home medications but will hold her aspirin for now.  She will have an insulin sliding scale for her diabetes.  Have explained to the patient the initial attempt will be made for conservative management of both her small bowel  obstruction acute diverticulitis but that there is a chance that she may need surgery if her symptoms do not improve. She understands this plan and all of her questions have been answered.   Melvyn Neth, [place]

## 2016-10-02 NOTE — ED Triage Notes (Signed)
Pt came to ED via EMS from [facility] c/o left sided abdominal pain since Monday. Reports n/v but has not vomited since Monday. Reports no BM in a few days. Pain 9/10, VS stable.

## 2016-10-03 ENCOUNTER — Inpatient Hospital Stay: Payer: 59

## 2016-10-03 DIAGNOSIS — K56609 Unspecified intestinal obstruction, unspecified as to partial versus complete obstruction: Secondary | ICD-10-CM

## 2016-10-03 LAB — GLUCOSE, CAPILLARY
GLUCOSE-CAPILLARY: 163 mg/dL — AB (ref 65–99)
GLUCOSE-CAPILLARY: 101 mg/dL — AB (ref 65–99)
Glucose-Capillary: 127 mg/dL — ABNORMAL HIGH (ref 65–99)
Glucose-Capillary: 139 mg/dL — ABNORMAL HIGH (ref 65–99)
Glucose-Capillary: 114 mg/dL — ABNORMAL HIGH (ref 65–99)
Glucose-Capillary: 121 mg/dL — ABNORMAL HIGH (ref 65–99)

## 2016-10-03 LAB — URINE CULTURE: Culture: NO GROWTH

## 2016-10-03 LAB — CBC WITH DIFFERENTIAL/PLATELET
Basophils Absolute: 0 10*3/uL (ref 0–0.1)
Basophils Relative: 0 %
EOS ABS: 0 10*3/uL (ref 0–0.7)
Eosinophils Relative: 0 %
HCT: 36.9 % (ref 35.0–47.0)
HEMOGLOBIN: 12.5 g/dL (ref 12.0–16.0)
LYMPHS ABS: 1.4 10*3/uL (ref 1.0–3.6)
Lymphocytes Relative: 26 %
MCH: 30.5 pg (ref 26.0–34.0)
MCHC: 33.8 g/dL (ref 32.0–36.0)
MCV: 90.3 fL (ref 80.0–100.0)
MONO ABS: 0.9 10*3/uL (ref 0.2–0.9)
MONOS PCT: 18 %
NEUTROS PCT: 56 %
Neutro Abs: 2.9 10*3/uL (ref 1.4–6.5)
Platelets: 235 10*3/uL (ref 150–440)
RBC: 4.09 MIL/uL (ref 3.80–5.20)
RDW: 13.6 % (ref 11.5–14.5)
WBC: 5.2 10*3/uL (ref 3.6–11.0)

## 2016-10-03 LAB — BASIC METABOLIC PANEL
ANION GAP: 5 (ref 5–15)
BUN: 31 mg/dL — ABNORMAL HIGH (ref 6–20)
CALCIUM: 8.6 mg/dL — AB (ref 8.9–10.3)
CHLORIDE: 110 mmol/L (ref 101–111)
CO2: 25 mmol/L (ref 22–32)
Creatinine, Ser: 1.18 mg/dL — ABNORMAL HIGH (ref 0.44–1.00)
GFR calc Af Amer: 51 mL/min — ABNORMAL LOW (ref >60)
GFR calc non Af Amer: 44 mL/min — ABNORMAL LOW (ref >60)
GLUCOSE: 121 mg/dL — AB (ref 65–99)
Potassium: 3.5 mmol/L (ref 3.5–5.1)
Sodium: 140 mmol/L (ref 135–145)

## 2016-10-03 LAB — MAGNESIUM: Magnesium: 2.2 mg/dL (ref 1.7–2.4)

## 2016-10-03 MED ORDER — BRIMONIDINE TARTRATE 0.15 % OP SOLN
2.0000 [drp] | Freq: Two times a day (BID) | OPHTHALMIC | Status: DC
  Administered 2016-10-03 – 2016-10-08 (×11): 2 [drp] via OPHTHALMIC
  Filled 2016-10-03: qty 5

## 2016-10-03 NOTE — Progress Notes (Signed)
CH was responding to an order to visit with the Pt in Rm 204 who had requested for CH's visit. [place] visited the Pt, but a Nurse was working with the Pt at the time, [place] decided to comeback after a short while. [place] visit Pt again at  11:25am. Pt appeared to be anxious about her current health condition, requested for prayer support to summon her courage to cope. [place] spoke with Pt, shared a Scriptural passage, and at the the request of the Pt, prayed with Pt, then left.    10/03/16 2000  Clinical Encounter Type  Visited With Patient  Visit Type Initial  Referral From Nurse  Consult/Referral To Chaplain  Spiritual Encounters  Spiritual Needs Prayer;Emotional;Other (Comment)

## 2016-10-03 NOTE — Progress Notes (Signed)
She is not improved this afternoon. Her plain films reveal persistent obstruction. She's afebrile without any change in her vital signs. Her exam still shows some mild abdominal tenderness. I am concerned about possibility of a closed loop obstruction. I discussed surgery with the patient in detail. She is contacting her family. We may tentatively plan surgery for tomorrow if she does not demonstrate any improvement overnight.

## 2016-10-03 NOTE — Progress Notes (Signed)
Subjective:   She is feeling better this morning with no abdominal pain. She denies any nausea. She did pass some gas last night but has not had any bowel movements. I have reviewed her initial workup her CT scan and her laboratory values.  Vital signs in last 24 hours: Temp:  [97.6 F (36.4 C)-98 F (36.7 C)] 97.7 F (36.5 C) (11/18 0643) Pulse Rate:  [71-104] 77 (11/18 0643) Resp:  [12-20] 16 (11/18 0643) BP: (70-197)/(52-83) 134/62 (11/18 0643) SpO2:  [89 %-100 %] 99 % (11/18 0643) Weight:  [69.7 kg (153 lb 11.2 oz)-70.8 kg (156 lb)] 69.7 kg (153 lb 11.2 oz) (11/17 2125) Last BM Date: 09/29/16  Intake/Output from previous day: 11/17 0701 - 11/18 0700 In: 927 [I.V.:877; IV Piggyback:50] Out: 250 [Urine:150; Emesis/NG output:100]  Exam:  Her abdomen is soft with some bowel sounds no guarding no rebound and no significant abdominal tenderness. She has full pulmonary excursion with clear lungs.  Lab Results:  CBC  Recent Labs  10/02/16 1148 10/03/16 0443  WBC 9.9 5.2  HGB 14.2 12.5  HCT 41.9 36.9  PLT 253 235   CMP     Component Value Date/Time   NA 140 10/03/2016 0443   NA 140 08/24/2016 1110   NA 140 06/17/2013 2034   K 3.5 10/03/2016 0443   K 4.0 08/23/2014 1641   CL 110 10/03/2016 0443   CL 104 06/17/2013 2034   CO2 25 10/03/2016 0443   CO2 30 06/17/2013 2034   GLUCOSE 121 (H) 10/03/2016 0443   GLUCOSE 120 (H) 06/17/2013 2034   BUN 31 (H) 10/03/2016 0443   BUN 17 08/24/2016 1110   BUN 17 06/17/2013 2034   CREATININE 1.18 (H) 10/03/2016 0443   CREATININE 0.90 06/17/2013 2034   CALCIUM 8.6 (L) 10/03/2016 0443   CALCIUM 9.6 06/17/2013 2034   PROT 6.9 10/02/2016 1148   PROT 6.7 09/16/2015 0957   PROT 6.8 01/21/2012 1557   ALBUMIN 4.0 10/02/2016 1148   ALBUMIN 4.1 08/24/2016 1110   ALBUMIN 3.6 01/21/2012 1557   AST 32 10/02/2016 1148   AST 29 01/21/2012 1557   ALT 23 10/02/2016 1148   ALT 25 01/21/2012 1557   ALKPHOS 67 10/02/2016 1148   ALKPHOS 53  01/21/2012 1557   BILITOT 1.2 10/02/2016 1148   BILITOT 0.4 09/16/2015 0957   BILITOT 0.3 01/21/2012 1557   GFRNONAA 44 (L) 10/03/2016 0443   GFRNONAA >60 06/17/2013 2034   GFRAA 51 (L) 10/03/2016 0443   GFRAA >60 06/17/2013 2034   PT/INR No results for input(s): LABPROT, INR in the last 72 hours.  Studies/Results: Dg Abdomen 1 View  Result Date: 10/02/2016 CLINICAL DATA:  NG tube placement EXAM: ABDOMEN - 1 VIEW COMPARISON:  CT 10/02/2016 FINDINGS: The nasogastric tube extends well into the stomach, with the tip curling up into the fundus. Abnormal dilatation and stacking of small bowel loops persists, consistent with high-grade obstruction. IMPRESSION: Satisfactorily positioned NG tube. Persistent small bowel obstruction. Electronically Signed   By: Andreas Newport M.D.   On: 10/02/2016 21:03   Ct Abdomen Pelvis W Contrast  Result Date: 10/02/2016 CLINICAL DATA:  Abdominal pain for 4 days.  Nausea and vomiting EXAM: CT ABDOMEN AND PELVIS WITH CONTRAST TECHNIQUE: Multidetector CT imaging of the abdomen and pelvis was performed using the standard protocol following bolus administration of intravenous contrast. Oral contrast was also administered. CONTRAST:  37mL ISOVUE-300 IOPAMIDOL (ISOVUE-300) INJECTION 61% COMPARISON:  June 08, 2016 FINDINGS: Lower chest: There is scarring  in the lung bases. There are foci of coronary artery calcification. There is a small hiatal hernia. Hepatobiliary: There are no focal liver lesions evident. There is sludge in the gallbladder. The gallbladder wall does not appear appreciably thickened. There is no appreciable biliary duct dilatation. Pancreas: No pancreatic mass or inflammatory focus is evident. Spleen: No splenic lesions are evident. There is calcification splenic artery near the splenic hilum. Adrenals/Urinary Tract: There is a 9 mm adrenal adenoma on the left. Right adrenal appears normal. There is a parapelvic cyst in the left kidney measuring 2.0 x  1.7 cm. There is no hydronephrosis on either side. There is no renal or ureteral calculus on either side. Urinary bladder is midline with wall thickness within normal limits. Stomach/Bowel: There are dilated loops of small bowel throughout the abdomen and pelvis with fluid throughout this bowel. There are scattered air-fluid levels throughout the bowel. There is a transition zone in the distal ileal region just to the right of midline, best seen on axial slice 36 series 3 indicative of a degree of small bowel obstruction distally. A second transition zone is noted in the pelvis involving a second loop of ileum, best seen on axial slice 63 series 3. This appearance of multiple transition zones raises concern for closed loop type obstruction. There is a suggestion of a swirl sign in the right mid to lower abdomen as well. There is no evident free air or portal venous air. There are scattered diverticula throughout the colon. There is a localized area of fluid and mesenteric thickening in the left lower quadrant in the distal descending colon region, felt to represent localized area of diverticulitis. This finding is best appreciated on axial slices 44 through 49, series 3 and coronal slices 43 through 55 series 6. There is no abscess or perforation in this area. Vascular/Lymphatic: There is atherosclerotic calcification in the aorta and common iliac arteries. There is also calcification in the hypogastric arteries. There is no abdominal aortic aneurysm. Major mesenteric vessels appear patent. There are scattered small mesenteric lymph nodes throughout the abdomen but no frank adenopathy by size criteria. Reproductive: Uterus is absent. There is no pelvic mass or pelvic fluid collection. Other: Appendix is absent.  No abscess seen.  No ascites. Musculoskeletal: There is degenerative change in the lumbar spine. No blastic or lytic bone lesions. No intramuscular or abdominal wall lesions. IMPRESSION: Multiple loops of  small bowel dilatation with transition zones in the mid to lower right abdomen with suggestion of swirl sign. These findings are felt to represent a closed loop type small bowel obstruction in the right abdomen. Focal area of apparent diverticulitis in the distal descending colon as described. No free air. No abscess. No renal or ureteral calculus. No hydronephrosis. There is aortoiliac atherosclerosis. There are foci of coronary artery calcification. There is a small hiatal hernia. Critical Value/emergent results were called by telephone at the time of interpretation on 10/02/2016 at 5:17 pm to Dr. Rudene Re , who verbally acknowledged these results. Electronically Signed   By: Lowella Grip III M.D.   On: 10/02/2016 17:19    Assessment/Plan: Her clinical assessment has improved. Her white blood cell count is down. I do not see the swirl sign on the CT scan as outlined in the radiology report. However, she does appear to have at least 2 areas of possible obstruction. I think the diagnosis of small bowel obstruction is more likely. She does feel better. We'll repeat her x-rays this morning and her  laboratory work again tomorrow morning. If she does not improve shortly we will consider surgical intervention because of the possibility of closed loop obstruction. She is in agreement with this plan.

## 2016-10-04 ENCOUNTER — Inpatient Hospital Stay: Payer: 59 | Admitting: Certified Registered"

## 2016-10-04 ENCOUNTER — Encounter
Admission: EM | Disposition: A | Discharge status: Home Health | Payer: Self-pay | Source: Home / Self Care | Attending: Surgery

## 2016-10-04 ENCOUNTER — Inpatient Hospital Stay: Payer: 59

## 2016-10-04 ENCOUNTER — Encounter: Payer: Self-pay | Admitting: Anesthesiology

## 2016-10-04 HISTORY — PX: LAPAROTOMY: SHX154

## 2016-10-04 LAB — GLUCOSE, CAPILLARY
GLUCOSE-CAPILLARY: 131 mg/dL — AB (ref 65–99)
Glucose-Capillary: 84 mg/dL (ref 65–99)
Glucose-Capillary: 125 mg/dL — ABNORMAL HIGH (ref 65–99)
Glucose-Capillary: 131 mg/dL — ABNORMAL HIGH (ref 65–99)
Glucose-Capillary: 123 mg/dL — ABNORMAL HIGH (ref 65–99)
Glucose-Capillary: 101 mg/dL — ABNORMAL HIGH (ref 65–99)

## 2016-10-04 LAB — BASIC METABOLIC PANEL WITH GFR
Anion gap: 7 (ref 5–15)
BUN: 25 mg/dL — ABNORMAL HIGH (ref 6–20)
CO2: 22 mmol/L (ref 22–32)
Calcium: 8.5 mg/dL — ABNORMAL LOW (ref 8.9–10.3)
Chloride: 112 mmol/L — ABNORMAL HIGH (ref 101–111)
Creatinine, Ser: 1.01 mg/dL — ABNORMAL HIGH (ref 0.44–1.00)
GFR calc Af Amer: >60 mL/min
GFR calc non Af Amer: 53 mL/min — ABNORMAL LOW
Glucose, Bld: 134 mg/dL — ABNORMAL HIGH (ref 65–99)
Potassium: 3.2 mmol/L — ABNORMAL LOW (ref 3.5–5.1)
Sodium: 141 mmol/L (ref 135–145)

## 2016-10-04 LAB — CBC
HCT: 33.8 % — ABNORMAL LOW (ref 35.0–47.0)
Hemoglobin: 11.3 g/dL — ABNORMAL LOW (ref 12.0–16.0)
MCH: 30.5 pg (ref 26.0–34.0)
MCHC: 33.5 g/dL (ref 32.0–36.0)
MCV: 91 fL (ref 80.0–100.0)
Platelets: 200 10*3/uL (ref 150–440)
RBC: 3.71 MIL/uL — ABNORMAL LOW (ref 3.80–5.20)
RDW: 13.9 % (ref 11.5–14.5)
WBC: 4.4 10*3/uL (ref 3.6–11.0)

## 2016-10-04 LAB — MRSA PCR SCREENING: MRSA by PCR: NEGATIVE

## 2016-10-04 SURGERY — LAPAROTOMY, EXPLORATORY
Anesthesia: General | Wound class: Clean

## 2016-10-04 MED ORDER — ONDANSETRON HCL 4 MG/2ML IJ SOLN
INTRAMUSCULAR | Status: AC
  Filled 2016-10-04: qty 2

## 2016-10-04 MED ORDER — LACTATED RINGERS IV SOLN
INTRAVENOUS | Status: DC | PRN
  Administered 2016-10-04: 12:00:00 via INTRAVENOUS

## 2016-10-04 MED ORDER — POTASSIUM CHLORIDE IN NACL 20-0.45 MEQ/L-% IV SOLN
INTRAVENOUS | Status: DC
  Administered 2016-10-04 – 2016-10-10 (×14): via INTRAVENOUS
  Filled 2016-10-04 (×21): qty 1000

## 2016-10-04 MED ORDER — SUGAMMADEX SODIUM 500 MG/5ML IV SOLN
INTRAVENOUS | Status: DC | PRN
  Administered 2016-10-04: 140 mg via INTRAVENOUS

## 2016-10-04 MED ORDER — FENTANYL CITRATE (PF) 100 MCG/2ML IJ SOLN
INTRAMUSCULAR | Status: AC
  Administered 2016-10-04: 25 ug via INTRAVENOUS
  Filled 2016-10-04: qty 2

## 2016-10-04 MED ORDER — FENTANYL CITRATE (PF) 100 MCG/2ML IJ SOLN
INTRAMUSCULAR | Status: DC | PRN
  Administered 2016-10-04: 100 ug via INTRAVENOUS
  Administered 2016-10-04: 50 ug via INTRAVENOUS
  Administered 2016-10-04: 25 ug via INTRAVENOUS

## 2016-10-04 MED ORDER — PROPOFOL 10 MG/ML IV BOLUS
INTRAVENOUS | Status: DC | PRN
  Administered 2016-10-04: 100 mg via INTRAVENOUS

## 2016-10-04 MED ORDER — FENTANYL CITRATE (PF) 100 MCG/2ML IJ SOLN
25.0000 ug | INTRAMUSCULAR | Status: DC | PRN
  Administered 2016-10-04 (×4): 25 ug via INTRAVENOUS

## 2016-10-04 MED ORDER — ROCURONIUM BROMIDE 100 MG/10ML IV SOLN
INTRAVENOUS | Status: DC | PRN
  Administered 2016-10-04: 40 mg via INTRAVENOUS
  Administered 2016-10-04: 5 mg via INTRAVENOUS
  Administered 2016-10-04: 10 mg via INTRAVENOUS

## 2016-10-04 MED ORDER — ONDANSETRON HCL 4 MG/2ML IJ SOLN
4.0000 mg | Freq: Once | INTRAMUSCULAR | Status: AC | PRN
  Administered 2016-10-04: 4 mg via INTRAVENOUS

## 2016-10-04 MED ORDER — SODIUM CHLORIDE 0.9 % IV SOLN
30.0000 meq | Freq: Once | INTRAVENOUS | Status: AC
  Administered 2016-10-04: 30 meq via INTRAVENOUS
  Filled 2016-10-04: qty 15

## 2016-10-04 MED ORDER — LIDOCAINE HCL (CARDIAC) 20 MG/ML IV SOLN
INTRAVENOUS | Status: DC | PRN
  Administered 2016-10-04: 80 mg via INTRAVENOUS

## 2016-10-04 MED ORDER — ONDANSETRON HCL 4 MG/2ML IJ SOLN
INTRAMUSCULAR | Status: DC | PRN
  Administered 2016-10-04: 4 mg via INTRAVENOUS

## 2016-10-04 MED ORDER — HYDROMORPHONE HCL 1 MG/ML IJ SOLN
1.0000 mg | INTRAMUSCULAR | Status: DC | PRN
  Administered 2016-10-04 – 2016-10-11 (×13): 1 mg via INTRAVENOUS
  Filled 2016-10-04 (×13): qty 1

## 2016-10-04 MED ORDER — SUCCINYLCHOLINE CHLORIDE 20 MG/ML IJ SOLN
INTRAMUSCULAR | Status: DC | PRN
  Administered 2016-10-04: 80 mg via INTRAVENOUS

## 2016-10-04 SURGICAL SUPPLY — 35 items
CANISTER SUCT 1200ML W/VALVE (MISCELLANEOUS) ×2 IMPLANT
CATH FOL LEG HOLDER (MISCELLANEOUS) ×2 IMPLANT
CATH TRAY 16F METER LATEX (MISCELLANEOUS) ×2 IMPLANT
CHLORAPREP W/TINT 26ML (MISCELLANEOUS) IMPLANT
DRAPE INCISE IOBAN 66X45 STRL (DRAPES) ×2 IMPLANT
DRAPE LAPAROTOMY 100X77 ABD (DRAPES) ×2 IMPLANT
DRSG OPSITE POSTOP 4X12 (GAUZE/BANDAGES/DRESSINGS) ×2 IMPLANT
ELECT CAUTERY NEEDLE TIP 1.0 (MISCELLANEOUS) ×2
ELECT REM PT RETURN 9FT ADLT (ELECTROSURGICAL) ×2
ELECTRODE CAUTERY NEDL TIP 1.0 (MISCELLANEOUS) ×1 IMPLANT
ELECTRODE REM PT RTRN 9FT ADLT (ELECTROSURGICAL) ×1 IMPLANT
GAUZE SPONGE 4X4 12PLY STRL (GAUZE/BANDAGES/DRESSINGS) IMPLANT
GLOVE BIO SURGEON STRL SZ7.5 (GLOVE) ×4 IMPLANT
GLOVE INDICATOR 8.0 STRL GRN (GLOVE) ×4 IMPLANT
GOWN STRL REUS W/ TWL LRG LVL3 (GOWN DISPOSABLE) ×2 IMPLANT
GOWN STRL REUS W/TWL LRG LVL3 (GOWN DISPOSABLE) ×2
KIT RM TURNOVER STRD PROC AR (KITS) ×2 IMPLANT
LABEL OR SOLS (LABEL) ×2 IMPLANT
LIGASURE MARYLAND LAP STAND (ELECTROSURGICAL) IMPLANT
NS IRRIG 1000ML POUR BTL (IV SOLUTION) ×2 IMPLANT
PACK BASIN MAJOR ARMC (MISCELLANEOUS) ×2 IMPLANT
PACK COLON CLEAN CLOSURE (MISCELLANEOUS) ×2 IMPLANT
RETAINER VISCERA MED (MISCELLANEOUS) ×2 IMPLANT
SOL PREP PVP 2OZ (MISCELLANEOUS) ×2
SOLUTION PREP PVP 2OZ (MISCELLANEOUS) ×1 IMPLANT
STAPLER SKIN PROX 35W (STAPLE) ×2 IMPLANT
STRIP CLOSURE SKIN 1/2X4 (GAUZE/BANDAGES/DRESSINGS) IMPLANT
SUT MAXON ABS #0 GS21 30IN (SUTURE) IMPLANT
SUT PDS AB 1 TP1 96 (SUTURE) ×4 IMPLANT
SUT SILK 3-0 (SUTURE) ×1
SUT SILK 3-0 SH-1 18XCR BRD (SUTURE) ×1
SUT VIC AB 3-0 SH 27 (SUTURE)
SUT VIC AB 3-0 SH 27X BRD (SUTURE) IMPLANT
SUT VICRYL+ 3-0 144IN (SUTURE) ×2 IMPLANT
SUTURE SILK 3-0 SH-1 18XCR BRD (SUTURE) ×1 IMPLANT

## 2016-10-04 NOTE — Transfer of Care (Signed)
Immediate Anesthesia Transfer of Care Note  Patient: Breanna Petty  Procedure(s) Performed: Procedure(s): EXPLORATORY LAPAROTOMY (N/A)  Patient Location: PACU  Anesthesia Type:General  Level of Consciousness: sedated and responds to stimulation  Airway & Oxygen Therapy: Patient Spontanous Breathing and Patient connected to face mask oxygen  Post-op Assessment: Report given to RN and Post -op Vital signs reviewed and stable  Post vital signs: Reviewed and stable  Last Vitals:  Vitals:   10/04/16 0508 10/04/16 1334  BP: (!) 170/68 (!) 163/63  Pulse: 88 80  Resp: 20 19  Temp: 36.5 C     Last Pain:  Vitals:   10/04/16 0508  TempSrc: Oral  PainSc:       Patients Stated Pain Goal: 0 ([id number])  Complications: No apparent anesthesia complications

## 2016-10-04 NOTE — Anesthesia Procedure Notes (Signed)
Procedure Name: Intubation Performed by: Cosette Prindle Pre-anesthesia Checklist: Patient identified, Patient being monitored, Timeout performed, Emergency Drugs available and Suction available Patient Re-evaluated:Patient Re-evaluated prior to inductionOxygen Delivery Method: Circle system utilized Preoxygenation: Pre-oxygenation with 100% oxygen Intubation Type: IV induction Ventilation: Mask ventilation without difficulty Laryngoscope Size: Mac and 3 Grade View: Grade I Tube type: Oral Tube size: 7.0 mm Number of attempts: 1 Airway Equipment and Method: Stylet Placement Confirmation: ETT inserted through vocal cords under direct vision,  positive ETCO2 and breath sounds checked- equal and bilateral Secured at: 21 cm Tube secured with: Tape Dental Injury: Teeth and Oropharynx as per pre-operative assessment        

## 2016-10-04 NOTE — Progress Notes (Signed)
Subjective:   She relates that she's had no pain over the course of the evening. She does not have any bowel function. She's having moderate amount of nasogastric drainage. Her labs remained stable but her potassium is slightly low. Plain films show no improvement and perhaps an increase in the distention of her small bowel.  Vital signs in last 24 hours: Temp:  [97.7 F (36.5 C)-98 F (36.7 C)] 97.7 F (36.5 C) (11/19 0508) Pulse Rate:  [78-95] 88 (11/19 0508) Resp:  [18-20] 20 (11/19 0508) BP: (142-170)/(61-84) 170/68 (11/19 0508) SpO2:  [98 %-100 %] 100 % (11/19 0508) Last BM Date: 09/29/16  Intake/Output from previous day: 11/18 0701 - 11/19 0700 In: 3337.2 [I.V.:3100.2; NG/GT:80; IV Piggyback:157] Out: 600 [Urine:300; Emesis/NG output:300]  Exam:  Her abdomen is soft with some mild distention little bit of right lower quadrant tenderness but no significant rebound or guarding. Her lungs are clear. She has normal pulmonary excursion.  Lab Results:  CBC  Recent Labs  10/03/16 0443 10/04/16 0450  WBC 5.2 4.4  HGB 12.5 11.3*  HCT 36.9 33.8*  PLT 235 200   CMP     Component Value Date/Time   NA 141 10/04/2016 0450   NA 140 08/24/2016 1110   NA 140 06/17/2013 2034   K 3.2 (L) 10/04/2016 0450   K 4.0 08/23/2014 1641   CL 112 (H) 10/04/2016 0450   CL 104 06/17/2013 2034   CO2 22 10/04/2016 0450   CO2 30 06/17/2013 2034   GLUCOSE 134 (H) 10/04/2016 0450   GLUCOSE 120 (H) 06/17/2013 2034   BUN 25 (H) 10/04/2016 0450   BUN 17 08/24/2016 1110   BUN 17 06/17/2013 2034   CREATININE 1.01 (H) 10/04/2016 0450   CREATININE 0.90 06/17/2013 2034   CALCIUM 8.5 (L) 10/04/2016 0450   CALCIUM 9.6 06/17/2013 2034   PROT 6.9 10/02/2016 1148   PROT 6.7 09/16/2015 0957   PROT 6.8 01/21/2012 1557   ALBUMIN 4.0 10/02/2016 1148   ALBUMIN 4.1 08/24/2016 1110   ALBUMIN 3.6 01/21/2012 1557   AST 32 10/02/2016 1148   AST 29 01/21/2012 1557   ALT 23 10/02/2016 1148   ALT 25  01/21/2012 1557   ALKPHOS 67 10/02/2016 1148   ALKPHOS 53 01/21/2012 1557   BILITOT 1.2 10/02/2016 1148   BILITOT 0.4 09/16/2015 0957   BILITOT 0.3 01/21/2012 1557   GFRNONAA 53 (L) 10/04/2016 0450   GFRNONAA >60 06/17/2013 2034   GFRAA >60 10/04/2016 0450   GFRAA >60 06/17/2013 2034   PT/INR No results for input(s): LABPROT, INR in the last 72 hours.  Studies/Results: Dg Abdomen 1 View  Result Date: 10/02/2016 CLINICAL DATA:  NG tube placement EXAM: ABDOMEN - 1 VIEW COMPARISON:  CT 10/02/2016 FINDINGS: The nasogastric tube extends well into the stomach, with the tip curling up into the fundus. Abnormal dilatation and stacking of small bowel loops persists, consistent with high-grade obstruction. IMPRESSION: Satisfactorily positioned NG tube. Persistent small bowel obstruction. Electronically Signed   By: Andreas Newport M.D.   On: 10/02/2016 21:03   Ct Abdomen Pelvis W Contrast  Result Date: 10/02/2016 CLINICAL DATA:  Abdominal pain for 4 days.  Nausea and vomiting EXAM: CT ABDOMEN AND PELVIS WITH CONTRAST TECHNIQUE: Multidetector CT imaging of the abdomen and pelvis was performed using the standard protocol following bolus administration of intravenous contrast. Oral contrast was also administered. CONTRAST:  70mL ISOVUE-300 IOPAMIDOL (ISOVUE-300) INJECTION 61% COMPARISON:  June 08, 2016 FINDINGS: Lower chest: There is scarring  in the lung bases. There are foci of coronary artery calcification. There is a small hiatal hernia. Hepatobiliary: There are no focal liver lesions evident. There is sludge in the gallbladder. The gallbladder wall does not appear appreciably thickened. There is no appreciable biliary duct dilatation. Pancreas: No pancreatic mass or inflammatory focus is evident. Spleen: No splenic lesions are evident. There is calcification splenic artery near the splenic hilum. Adrenals/Urinary Tract: There is a 9 mm adrenal adenoma on the left. Right adrenal appears normal. There  is a parapelvic cyst in the left kidney measuring 2.0 x 1.7 cm. There is no hydronephrosis on either side. There is no renal or ureteral calculus on either side. Urinary bladder is midline with wall thickness within normal limits. Stomach/Bowel: There are dilated loops of small bowel throughout the abdomen and pelvis with fluid throughout this bowel. There are scattered air-fluid levels throughout the bowel. There is a transition zone in the distal ileal region just to the right of midline, best seen on axial slice 36 series 3 indicative of a degree of small bowel obstruction distally. A second transition zone is noted in the pelvis involving a second loop of ileum, best seen on axial slice 63 series 3. This appearance of multiple transition zones raises concern for closed loop type obstruction. There is a suggestion of a swirl sign in the right mid to lower abdomen as well. There is no evident free air or portal venous air. There are scattered diverticula throughout the colon. There is a localized area of fluid and mesenteric thickening in the left lower quadrant in the distal descending colon region, felt to represent localized area of diverticulitis. This finding is best appreciated on axial slices 44 through 49, series 3 and coronal slices 43 through 55 series 6. There is no abscess or perforation in this area. Vascular/Lymphatic: There is atherosclerotic calcification in the aorta and common iliac arteries. There is also calcification in the hypogastric arteries. There is no abdominal aortic aneurysm. Major mesenteric vessels appear patent. There are scattered small mesenteric lymph nodes throughout the abdomen but no frank adenopathy by size criteria. Reproductive: Uterus is absent. There is no pelvic mass or pelvic fluid collection. Other: Appendix is absent.  No abscess seen.  No ascites. Musculoskeletal: There is degenerative change in the lumbar spine. No blastic or lytic bone lesions. No intramuscular or  abdominal wall lesions. IMPRESSION: Multiple loops of small bowel dilatation with transition zones in the mid to lower right abdomen with suggestion of swirl sign. These findings are felt to represent a closed loop type small bowel obstruction in the right abdomen. Focal area of apparent diverticulitis in the distal descending colon as described. No free air. No abscess. No renal or ureteral calculus. No hydronephrosis. There is aortoiliac atherosclerosis. There are foci of coronary artery calcification. There is a small hiatal hernia. Critical Value/emergent results were called by telephone at the time of interpretation on 10/02/2016 at 5:17 pm to Dr. Rudene Re , who verbally acknowledged these results. Electronically Signed   By: Lowella Grip III M.D.   On: 10/02/2016 17:19   Dg Abd 2 Views  Result Date: 10/04/2016 CLINICAL DATA:  Small bowel obstruction EXAM: ABDOMEN - 2 VIEW COMPARISON:  10/03/2016 FINDINGS: NG tube remains in the proximal stomach. Continued dilated small bowel loops throughout the abdomen and pelvis compatible with high-grade small bowel obstruction. No free air organomegaly. IMPRESSION: Stable high grade small bowel obstruction pattern. Electronically Signed   By: Rolm Baptise M.D.  On: 10/04/2016 08:07   Dg Abd 2 Views  Result Date: 10/03/2016 CLINICAL DATA:  Small bowel obstruction. EXAM: ABDOMEN - 2 VIEW COMPARISON:  10/02/2016; CT abdomen pelvis - 10/02/2016 FINDINGS: Re- demonstrated moderate to marked gas distention of multiple loops of small bowel with dominant loop of small bowel within the right mid hemi abdomen measuring approximately 4.2 cm in diameter. These findings are associated with a paucity of distal colonic gas. No pneumoperitoneum, pneumatosis or portal venous gas Enteric tube tip and side port projected the expected location of the gastric fundus. Limited visualization of the lower thorax is normal. Mild scoliotic curvature of the thoracolumbar  spine. Stigmata of DISH within the lower thoracic spine. Suspected mild to moderate multilevel lumbar spine DDD, incompletely evaluated. A minimal amount of excreted contrast is seen within the urinary bladder. IMPRESSION: 1. Grossly unchanged findings suggestive of small bowel obstruction. 2. Enteric tube tip and side port project over the gastric fundus. Electronically Signed   By: Sandi Mariscal M.D.   On: 10/03/2016 09:04    Assessment/Plan: I do not see any significant improvement. Her small bowel obstruction appears to be unresolved. With the initial concern about the possibility of

## 2016-10-04 NOTE — Op Note (Signed)
10/02/2016 - 10/04/2016  1:43 PM  PATIENT:  Breanna Petty  75 y.o. female  PRE-OPERATIVE DIAGNOSIS:  bowel obstruction  POST-OPERATIVE DIAGNOSIS:  bowel obstruction  PROCEDURE:  Procedure(s): EXPLORATORY LAPAROTOMY (N/A)  SURGEON:  Surgeon(s) and Role:    * Dia Crawford III, MD - Primary   ASSISTANTS: none   ANESTHESIA:   general  EBL:  Total I/O In: 40 [NG/GT:40] Out: 375 [Urine:275; Blood:100]   DRAINS: Nasogastric Tube   LOCAL MEDICATIONS USED:  NONE   DISPOSITION OF SPECIMEN:  N/A   DICTATION: .Dragon Dictation with the patient in the supine position and after the induction of appropriate general anesthesia the patient's abdomen was prepped with Betadine and draped sterile towels. An alcohol wipe and Betadine impregnated Steri-Drape are utilized. The midline incision was made from above the umbilicus to the suprapubic area and carried down through the subcutaneous tissue using Bovie electrocautery. The midline fascia identified and opened the length of the skin incision as was the peritoneum. There were multiple dense adhesions to the anterior abdominal wall from the omentum and her previous surgeries. Tedious dissection was required to expose the omentum and separated and transverse colon from the anterior abdominal wall. Once it was accomplished the small bowel was elevated into the incision.  Small bowel was markedly distended and discolored in several areas. The obstruction appeared results from a band adhesion with a closed loop obstruction in the right lower quadrant. This area was lysed and the bowel freed. One small serosal defect was encountered and closed with seromuscular sutures of 3-0 silk. The bowel was then run from the ligament of Treitz to ileocecal valve and and in reverse. No other abnormalities were identified. The contents were milked into the stomach where nasogastric tube was appropriately positioned and into the right colon. The area was then irrigated  with warm saline solution. Midline fascia was closed with running suture of #1 looped PDS starting at each end and tying in the middle. The knot was buried. Skin was clipped. Sterile dressings were applied. The patient was returned to recovery room having tolerated the procedure well. Sponge instrument needle count were correct 2 in the operating room.  PLAN OF CARE: Admit to inpatient   PATIENT DISPOSITION:  PACU - hemodynamically stable.   Dia Crawford III, MD

## 2016-10-04 NOTE — Anesthesia Preprocedure Evaluation (Addendum)
Anesthesia Evaluation  Patient identified by MRN, date of birth, ID band Patient awake    Reviewed: Allergy & Precautions, NPO status , Patient's Chart, lab work & pertinent test results  Airway Mallampati: II       Dental no notable dental hx.    Pulmonary COPD, former smoker,    Pulmonary exam normal        Cardiovascular hypertension, Pt. on medications + CAD and + Peripheral Vascular Disease  Normal cardiovascular exam     Neuro/Psych PSYCHIATRIC DISORDERS Anxiety Depression Hx of herpes zoster  Neuromuscular disease    GI/Hepatic hiatal hernia, GERD  ,  Endo/Other  diabetes, Well Controlled  Renal/GU      Musculoskeletal  (+) Arthritis , Osteoarthritis,    Abdominal Normal abdominal exam  (+)   Peds negative pediatric ROS (+)  Hematology negative hematology ROS (+)   Anesthesia Other Findings Past Medical History: No date: Anxiety No date: Aortic atherosclerosis ([place]) No date: Atherosclerotic heart disease No date: Depression No date: Diabetes mellitus without complication (H[facility]) No date: Diverticulosis of colon No date: GERD (gastroesophageal reflux disease) No date: Hiatal hernia No date: History of heart artery stent No date: History of shingles No date: Hyperlipidemia No date: Hypertension No date: Sarcoidosis ([place]) No date: Vitamin D deficiency  Reproductive/Obstetrics                             Anesthesia Physical Anesthesia Plan  ASA: III and emergent  Anesthesia Plan: General   Post-op Pain Management:    Induction: Intravenous  Airway Management Planned: Oral ETT  Additional Equipment:   Intra-op Plan:   Post-operative Plan: Extubation in OR  Informed Consent: I have reviewed the patients History and Physical, chart, labs and discussed the procedure including the risks, benefits and alternatives for the proposed anesthesia with the patient or  authorized representative who has indicated his/her understanding and acceptance.   Dental advisory given  Plan Discussed with: CRNA and Surgeon  Anesthesia Plan Comments:         Anesthesia Quick Evaluation

## 2016-10-04 NOTE — Anesthesia Postprocedure Evaluation (Signed)
Anesthesia Post Note  Patient: Breanna Petty  Procedure(s) Performed: Procedure(s) (LRB): EXPLORATORY LAPAROTOMY (N/A)  Patient location during evaluation: PACU Anesthesia Type: General Level of consciousness: awake and alert and oriented Pain management: pain level controlled Vital Signs Assessment: post-procedure vital signs reviewed and stable Respiratory status: spontaneous breathing Cardiovascular status: blood pressure returned to baseline Anesthetic complications: no    Last Vitals:  Vitals:   10/04/16 0508 10/04/16 1334  BP: (!) 170/68 (!) 163/63  Pulse: 88 80  Resp: 20 19  Temp: 36.5 C 36.5 C    Last Pain:  Vitals:   10/04/16 1334  TempSrc: Temporal  PainSc: 0-No pain                 Quinnlyn Hearns

## 2016-10-04 NOTE — Addendum Note (Signed)
Addendum  created 10/04/16 1759 by Alvin Critchley, MD   Anesthesia Event edited, Sign clinical note

## 2016-10-05 ENCOUNTER — Encounter: Payer: Self-pay | Admitting: Surgery

## 2016-10-05 LAB — MAGNESIUM: Magnesium: 1.9 mg/dL (ref 1.7–2.4)

## 2016-10-05 LAB — CBC
HEMATOCRIT: 36.4 % (ref 35.0–47.0)
HEMOGLOBIN: 12.2 g/dL (ref 12.0–16.0)
MCH: 30.3 pg (ref 26.0–34.0)
MCHC: 33.4 g/dL (ref 32.0–36.0)
MCV: 90.9 fL (ref 80.0–100.0)
Platelets: 223 10*3/uL (ref 150–440)
RBC: 4 MIL/uL (ref 3.80–5.20)
RDW: 14.1 % (ref 11.5–14.5)
WBC: 8.2 10*3/uL (ref 3.6–11.0)

## 2016-10-05 LAB — BASIC METABOLIC PANEL
Anion gap: 8 (ref 5–15)
BUN: 21 mg/dL — AB (ref 6–20)
CHLORIDE: 111 mmol/L (ref 101–111)
CO2: 21 mmol/L — AB (ref 22–32)
CREATININE: 0.87 mg/dL (ref 0.44–1.00)
Calcium: 8.4 mg/dL — ABNORMAL LOW (ref 8.9–10.3)
GFR calc Af Amer: >60 mL/min (ref >60)
GFR calc non Af Amer: >60 mL/min (ref >60)
Glucose, Bld: 114 mg/dL — ABNORMAL HIGH (ref 65–99)
POTASSIUM: 4 mmol/L (ref 3.5–5.1)
Sodium: 140 mmol/L (ref 135–145)

## 2016-10-05 LAB — GLUCOSE, CAPILLARY
GLUCOSE-CAPILLARY: 102 mg/dL — AB (ref 65–99)
GLUCOSE-CAPILLARY: 122 mg/dL — AB (ref 65–99)
GLUCOSE-CAPILLARY: 123 mg/dL — AB (ref 65–99)
GLUCOSE-CAPILLARY: 124 mg/dL — AB (ref 65–99)
Glucose-Capillary: 117 mg/dL — ABNORMAL HIGH (ref 65–99)

## 2016-10-05 NOTE — Progress Notes (Signed)
1 Day Post-Op  Subjective: Status post exploratory laparotomy for small bowel obstruction. Patient feels better today she's not passing gas has a nasogastric tube in place. Minimal abdominal pain and incisional only  Objective: Vital signs in last 24 hours: Temp:  [97.7 F (36.5 C)-98.6 F (37 C)] 98 F (36.7 C) (11/20 0457) Pulse Rate:  [80-100] 99 (11/20 0457) Resp:  [16-20] 16 (11/19 2015) BP: (150-167)/(52-63) 150/62 (11/20 0457) SpO2:  [98 %-100 %] 99 % (11/20 0457) Last BM Date: 09/29/16  Intake/Output from previous day: 11/19 0701 - 11/20 0700 In: 2172.5 [I.V.:1924.5; NG/GT:80; IV Piggyback:168] Out: 860 [Urine:585; Emesis/NG output:175; Blood:100] Intake/Output this shift: No intake/output data recorded.  Physical exam:  Wound is dressed with no erythema or drainage abdomen is distended and tympanitic but nontender calves are nontender NG in place  Lab Results: CBC   Recent Labs  10/04/16 0450 10/05/16 0412  WBC 4.4 8.2  HGB 11.3* 12.2  HCT 33.8* 36.4  PLT 200 223   BMET  Recent Labs  10/04/16 0450 10/05/16 0412  NA 141 140  K 3.2* 4.0  CL 112* 111  CO2 22 21*  GLUCOSE 134* 114*  BUN 25* 21*  CREATININE 1.01* 0.87  CALCIUM 8.5* 8.4*   PT/INR No results for input(s): LABPROT, INR in the last 72 hours. ABG No results for input(s): PHART, HCO3 in the last 72 hours.  Invalid input(s): PCO2, PO2  Studies/Results: Dg Abd 2 Views  Result Date: 10/04/2016 CLINICAL DATA:  Small bowel obstruction EXAM: ABDOMEN - 2 VIEW COMPARISON:  10/03/2016 FINDINGS: NG tube remains in the proximal stomach. Continued dilated small bowel loops throughout the abdomen and pelvis compatible with high-grade small bowel obstruction. No free air organomegaly. IMPRESSION: Stable high grade small bowel obstruction pattern. Electronically Signed   By: Rolm Baptise M.D.   On: 10/04/2016 08:07   Dg Abd 2 Views  Result Date: 10/03/2016 CLINICAL DATA:  Small bowel obstruction.  EXAM: ABDOMEN - 2 VIEW COMPARISON:  10/02/2016; CT abdomen pelvis - 10/02/2016 FINDINGS: Re- demonstrated moderate to marked gas distention of multiple loops of small bowel with dominant loop of small bowel within the right mid hemi abdomen measuring approximately 4.2 cm in diameter. These findings are associated with a paucity of distal colonic gas. No pneumoperitoneum, pneumatosis or portal venous gas Enteric tube tip and side port projected the expected location of the gastric fundus. Limited visualization of the lower thorax is normal. Mild scoliotic curvature of the thoracolumbar spine. Stigmata of DISH within the lower thoracic spine. Suspected mild to moderate multilevel lumbar spine DDD, incompletely evaluated. A minimal amount of excreted contrast is seen within the urinary bladder. IMPRESSION: 1. Grossly unchanged findings suggestive of small bowel obstruction. 2. Enteric tube tip and side port project over the gastric fundus. Electronically Signed   By: Sandi Mariscal M.D.   On: 10/03/2016 09:04    Anti-infectives: Anti-infectives    Start     Dose/Rate Route Frequency Ordered Stop   10/02/16 2200  piperacillin-tazobactam (ZOSYN) IVPB 3.375 g     3.375 g 12.5 mL/hr over 240 Minutes Intravenous Every 8 hours 10/02/16 2031     10/02/16 2015  cefTRIAXone (ROCEPHIN) 2 g in dextrose 5 % 50 mL IVPB  Status:  Discontinued     2 g 100 mL/hr over 30 Minutes Intravenous Every 24 hours 10/02/16 2006 10/02/16 2030   10/02/16 2015  metroNIDAZOLE (FLAGYL) IVPB 500 mg  Status:  Discontinued     500 mg 100  mL/hr over 60 Minutes Intravenous Every 8 hours 10/02/16 2006 10/02/16 2030   10/02/16 1730  piperacillin-tazobactam (ZOSYN) IVPB 3.375 g     3.375 g 100 mL/hr over 30 Minutes Intravenous  Once 10/02/16 1720 10/02/16 1806   10/02/16 1730  metroNIDAZOLE (FLAGYL) IVPB 500 mg     500 mg 100 mL/hr over 60 Minutes Intravenous  Once 10/02/16 1720 10/02/16 1911      Assessment/Plan: s/p  Procedure(s): EXPLORATORY LAPAROTOMY   Patient doing quite well postop day 1 we'll continue nasogastric tube until she is passing gas.  Florene Glen, MD, FACS  10/05/2016

## 2016-10-05 NOTE — Progress Notes (Signed)
[place] made a follow-up visit with Pt whom he had seen on Saturday. Pt had procedure on Sunday and appeared to be doing well. Pt told Ch, she was happy with the surgery outcome and requested prayers for fully recovery, which the [facility], provided.

## 2016-10-06 LAB — GLUCOSE, CAPILLARY
GLUCOSE-CAPILLARY: 114 mg/dL — AB (ref 65–99)
GLUCOSE-CAPILLARY: 99 mg/dL (ref 65–99)
GLUCOSE-CAPILLARY: 109 mg/dL — AB (ref 65–99)
Glucose-Capillary: 105 mg/dL — ABNORMAL HIGH (ref 65–99)
Glucose-Capillary: 107 mg/dL — ABNORMAL HIGH (ref 65–99)
Glucose-Capillary: 110 mg/dL — ABNORMAL HIGH (ref 65–99)
Glucose-Capillary: 93 mg/dL (ref 65–99)

## 2016-10-06 NOTE — Progress Notes (Signed)
Chaplain rounded the unit to provide a compssionate presence and support for the patient. Chaplain prayed and provided pastoral care along with the other Chaplain represented. Minerva Fester [phone]

## 2016-10-06 NOTE — Progress Notes (Signed)
2 Days Post-Op  Subjective: Patient thinks she may have passed a minimal amount of flatus but not much yet she has no nausea or vomiting.  Objective: Vital signs in last 24 hours: Temp:  [98.1 F (36.7 C)-99.2 F (37.3 C)] 98.3 F (36.8 C) (11/21 0454) Pulse Rate:  [102-103] 102 (11/21 0454) Resp:  [16-17] 17 (11/21 0454) BP: (137-142)/(58-64) 137/64 (11/21 0454) SpO2:  [98 %-100 %] 100 % (11/21 0454) Last BM Date: (P) 09/29/16  Intake/Output from previous day: 11/20 0701 - 11/21 0700 In: 1258.3 [I.V.:1158.3; IV Piggyback:100] Out: 1200 [Urine:800; Emesis/NG output:400] Intake/Output this shift: No intake/output data recorded.  Physical exam:  Wound is clean. Abdomen remains quite distended. She is completely nontender. Calves are nontender awake alert oriented vital signs are reviewed  Lab Results: CBC   Recent Labs  10/04/16 0450 10/05/16 0412  WBC 4.4 8.2  HGB 11.3* 12.2  HCT 33.8* 36.4  PLT 200 223   BMET  Recent Labs  10/04/16 0450 10/05/16 0412  NA 141 140  K 3.2* 4.0  CL 112* 111  CO2 22 21*  GLUCOSE 134* 114*  BUN 25* 21*  CREATININE 1.01* 0.87  CALCIUM 8.5* 8.4*   PT/INR No results for input(s): LABPROT, INR in the last 72 hours. ABG No results for input(s): PHART, HCO3 in the last 72 hours.  Invalid input(s): PCO2, PO2  Studies/Results: No results found.  Anti-infectives: Anti-infectives    Start     Dose/Rate Route Frequency Ordered Stop   10/02/16 2200  piperacillin-tazobactam (ZOSYN) IVPB 3.375 g     3.375 g 12.5 mL/hr over 240 Minutes Intravenous Every 8 hours 10/02/16 2031     10/02/16 2015  cefTRIAXone (ROCEPHIN) 2 g in dextrose 5 % 50 mL IVPB  Status:  Discontinued     2 g 100 mL/hr over 30 Minutes Intravenous Every 24 hours 10/02/16 2006 10/02/16 2030   10/02/16 2015  metroNIDAZOLE (FLAGYL) IVPB 500 mg  Status:  Discontinued     500 mg 100 mL/hr over 60 Minutes Intravenous Every 8 hours 10/02/16 2006 10/02/16 2030   10/02/16 1730  piperacillin-tazobactam (ZOSYN) IVPB 3.375 g     3.375 g 100 mL/hr over 30 Minutes Intravenous  Once 10/02/16 1720 10/02/16 1806   10/02/16 1730  metroNIDAZOLE (FLAGYL) IVPB 500 mg     500 mg 100 mL/hr over 60 Minutes Intravenous  Once 10/02/16 1720 10/02/16 1911      Assessment/Plan: s/p Procedure(s): EXPLORATORY LAPAROTOMY   Postop day 2 LoA recommend continuing NG tube until it is clear that she has passed gas. We will reexamine later today and potentially remove NG tube. Florene Glen, MD, FACS  10/06/2016

## 2016-10-06 NOTE — Plan of Care (Signed)
Problem: Fluid Volume: Goal: Ability to maintain a balanced intake and output will improve Outcome: Not Progressing Patient is NPO except ice chips.  Problem: Nutrition: Goal: Adequate nutrition will be maintained Outcome: Not Progressing Patient is NPO except ice chips.  Problem: Bowel/Gastric: Goal: Will not experience complications related to bowel motility Outcome: Progressing Patient is now passing flatus.

## 2016-10-06 NOTE — Plan of Care (Signed)
Problem: Bowel/Gastric: Goal: Will not experience complications related to bowel motility Outcome: Not Progressing Patient has not passed flatus yet.

## 2016-10-07 LAB — GLUCOSE, CAPILLARY
GLUCOSE-CAPILLARY: 94 mg/dL (ref 65–99)
GLUCOSE-CAPILLARY: 81 mg/dL (ref 65–99)
GLUCOSE-CAPILLARY: 91 mg/dL (ref 65–99)
Glucose-Capillary: 106 mg/dL — ABNORMAL HIGH (ref 65–99)
Glucose-Capillary: 88 mg/dL (ref 65–99)

## 2016-10-07 LAB — CULTURE, BLOOD (ROUTINE X 2)
CULTURE: NO GROWTH
CULTURE: NO GROWTH

## 2016-10-07 NOTE — Plan of Care (Signed)
Problem: Activity: Goal: Risk for activity intolerance will decrease Outcome: Progressing Patient has been up to the chair and ambulating in her room.  Problem: Fluid Volume: Goal: Ability to maintain a balanced intake and output will improve Outcome: Not Progressing Patient is NPO except ice chips.  Problem: Nutrition: Goal: Adequate nutrition will be maintained Outcome: Not Progressing Patient is NPO except ice chips.  Problem: Bowel/Gastric: Goal: Will not experience complications related to bowel motility Outcome: Progressing Patient is now passing flatus.

## 2016-10-07 NOTE — Evaluation (Signed)
Physical Therapy Evaluation Patient Details Name: Breanna Petty MRN: [id number] DOB: 08/23/1941 Today's Date: 10/07/2016   History of Present Illness  Pt is a 75 y.o. female presenting to hospital with L sided abdominal pain.  Pt admitted with acute diverticulitis and SBO.  Pt s/p exploratory laparotomy 10/04/16 and NG tube placed on low continuous suction.  PMH includes anxiety, depression, DM, h/o shingles, htn, hiatal hernia, and cardiac surgery.  Clinical Impression  Prior to hospital admission, pt was independent with functional mobility.  Pt lives alone in 1 level home with stairs to enter.  Currently pt is SBA with bed mobility and CGA with transfers and ambulation around nursing loop with UE support on IV pole (pt declined to trial RW).  Pt would benefit from skilled PT to address noted impairments and functional limitations.  Recommend pt discharge to home when medically appropriate; anticipate no further PT needs upon hospital discharge.    Follow Up Recommendations No PT follow up    Equipment Recommendations   (TBD)    Recommendations for Other Services       Precautions / Restrictions Precautions Precautions: Fall Precaution Comments: NG tube to wall suction; abdominal incision; NPO Restrictions Weight Bearing Restrictions: No      Mobility  Bed Mobility Overal bed mobility: Needs Assistance Bed Mobility: Supine to Sit;Sit to Supine     Supine to sit: Supervision;HOB elevated Sit to supine: Supervision;HOB elevated   General bed mobility comments: increased effort and time to perform on own; vc's for logrolling  Transfers Overall transfer level: Needs assistance Equipment used:  (UE support on IV pole) Transfers: Sit to/from [facility] Sit to Stand: Min guard Stand pivot transfers: Min guard (toilet transfer)       General transfer comment: mildly unsteady initially (CGA) but improved with repetitions (x3 total) of  standing  Ambulation/Gait Ambulation/Gait assistance: Min guard Ambulation Distance (Feet): 220 Feet Assistive device:  (B UE support on IV pole)   Gait velocity: decreased (pt reports walking slower at baseline)   General Gait Details: decreased B step length/foot clearance/heelstrike; steady without loss of balance  Stairs            Wheelchair Mobility    Modified Rankin (Stroke Patients Only)       Balance Overall balance assessment: Needs assistance Sitting-balance support: No upper extremity supported;Feet supported Sitting balance-Leahy Scale: Good     Standing balance support: Single extremity supported;During functional activity Standing balance-Leahy Scale: Good Standing balance comment: standing doffing/then donning new underwear (standing on 1 leg to do this; single UE support on grab bar)                             Pertinent Vitals/Pain Pain Assessment: No/denies pain  Vitals (HR and O2 on room air) stable and WFL throughout treatment session.    Home Living Family/patient expects to be discharged to:: Private residence Living Arrangements: Alone   Type of Home: House Home Access: Stairs to enter Entrance Stairs-Rails: Right;Left;Can reach both Entrance Stairs-Number of Steps: 4 Home Layout: One level Home Equipment:  (Question if has access to RW?)      Prior Function Level of Independence: Independent         Comments: Pt reports not walking fast in general.  Pt denies any falls in past 6 months.     Hand Dominance        Extremity/Trunk Assessment   Upper Extremity Assessment:  Generalized weakness           Lower Extremity Assessment: Generalized weakness         Communication   Communication: No difficulties  Cognition Arousal/Alertness: Awake/alert Behavior During Therapy: WFL for tasks assessed/performed Overall Cognitive Status: Within Functional Limits for tasks assessed                       General Comments General comments (skin integrity, edema, etc.): Pt resting in bed upon PT arrival.  Nursing clamped NG tube for session and re-attached end of session.  Nursing cleared pt for participation in physical therapy.  Pt agreeable to PT session.    Exercises     Assessment/Plan    PT Assessment Patient needs continued PT services  PT Problem List Decreased strength;Decreased balance;Decreased mobility          PT Treatment Interventions DME instruction;Gait training;Stair training;Functional mobility training;Therapeutic activities;Therapeutic exercise;Balance training;Patient/family education    PT Goals (Current goals can be found in the Care Plan section)  Acute Rehab PT Goals Patient Stated Goal: to go home PT Goal Formulation: With patient Time For Goal Achievement: 10/21/16 Potential to Achieve Goals: Good    Frequency Min 2X/week   Barriers to discharge        Co-evaluation               End of Session Equipment Utilized During Treatment: Gait belt (up high) Activity Tolerance: Patient tolerated treatment well Patient left: in bed;with call bell/phone within reach;with bed alarm set Nurse Communication: Mobility status;Precautions         Time: [id number] PT Time Calculation (min) (ACUTE ONLY): 35 min   Charges:   PT Evaluation $PT Eval Low Complexity: 1 Procedure PT Treatments $Therapeutic Activity: 8-22 mins   PT G CodesLeitha Bleak 2016/10/12, 3:44 PM Leitha Bleak, [place]

## 2016-10-07 NOTE — Progress Notes (Signed)
3 Days Post-Op  Subjective: Still not much flatus yet. She has no pain no nausea or vomiting  Objective: Vital signs in last 24 hours: Temp:  [97.4 F (36.3 C)-98.8 F (37.1 C)] 97.4 F (36.3 C) (11/22 0448) Pulse Rate:  [92-103] 103 (11/22 0448) Resp:  [16-20] 20 (11/22 0448) BP: (132-150)/(63-74) 150/73 (11/22 0448) SpO2:  [96 %-99 %] 99 % (11/22 0448) Last BM Date: 09/29/16  Intake/Output from previous day: 11/21 0701 - 11/22 0700 In: 1976.7 [P.O.:15; I.V.:1861.7; IV Piggyback:100] Out: 1300 [Urine:1100; Emesis/NG output:200] Intake/Output this shift: No intake/output data recorded.  Physical exam:  Distended and tympanitic wound is clean no erythema no drainage nontender calves  Lab Results: CBC   Recent Labs  10/05/16 0412  WBC 8.2  HGB 12.2  HCT 36.4  PLT 223   BMET  Recent Labs  10/05/16 0412  NA 140  K 4.0  CL 111  CO2 21*  GLUCOSE 114*  BUN 21*  CREATININE 0.87  CALCIUM 8.4*   PT/INR No results for input(s): LABPROT, INR in the last 72 hours. ABG No results for input(s): PHART, HCO3 in the last 72 hours.  Invalid input(s): PCO2, PO2  Studies/Results: No results found.  Anti-infectives: Anti-infectives    Start     Dose/Rate Route Frequency Ordered Stop   10/02/16 2200  piperacillin-tazobactam (ZOSYN) IVPB 3.375 g     3.375 g 12.5 mL/hr over 240 Minutes Intravenous Every 8 hours 10/02/16 2031     10/02/16 2015  cefTRIAXone (ROCEPHIN) 2 g in dextrose 5 % 50 mL IVPB  Status:  Discontinued     2 g 100 mL/hr over 30 Minutes Intravenous Every 24 hours 10/02/16 2006 10/02/16 2030   10/02/16 2015  metroNIDAZOLE (FLAGYL) IVPB 500 mg  Status:  Discontinued     500 mg 100 mL/hr over 60 Minutes Intravenous Every 8 hours 10/02/16 2006 10/02/16 2030   10/02/16 1730  piperacillin-tazobactam (ZOSYN) IVPB 3.375 g     3.375 g 100 mL/hr over 30 Minutes Intravenous  Once 10/02/16 1720 10/02/16 1806   10/02/16 1730  metroNIDAZOLE (FLAGYL) IVPB 500 mg      500 mg 100 mL/hr over 60 Minutes Intravenous  Once 10/02/16 1720 10/02/16 1911      Assessment/Plan: s/p Procedure(s): EXPLORATORY LAPAROTOMY   We'll continue NG tube for now until the patient is passing a and of gas to warrant removal without considerable risk of having to replace the NG tube patient was understanding of this  Florene Glen, MD, Allegra Grana  10/07/2016

## 2016-10-07 NOTE — Care Management (Signed)
Patient admitted post exploratory laparotomy. Patient lives at home alone.  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  PCP Otilio Miu.  Pharmacy [facility] outpatient pharmacy.  Patient has midline incision, and NG in place.  PT consult pending.  RNCM following for discharge planning needs

## 2016-10-07 NOTE — Progress Notes (Signed)
Initial Nutrition Assessment  DOCUMENTATION CODES:   Not applicable  INTERVENTION:  Advance diet per Surgery.  If patient unable to advance or tolerate diet by day 7-10 of NPO (11/24-11/27), recommend initiation of nutrition support to meet calorie and protein needs.  As diet advanced will provide oral nutrition supplement or other interventions as needed.  NUTRITION DIAGNOSIS:   Inadequate oral intake related to inability to eat as evidenced by NPO status.  GOAL:   Patient will meet greater than or equal to 90% of their needs  MONITOR:   Diet advancement, Labs, Weight trends, I & O's  REASON FOR ASSESSMENT:   NPO/Clear Liquid Diet    ASSESSMENT:   74 y.o. female who presents with a five-day history of abdominal pain. Patient reports she started having some mild pain on the left side on 11/13 and then the next day became very severe. This was associated with episodes of nausea and vomiting on that same day. Since then she has remained nauseous but has not had any other episodes of emesis. That was also the last day that she had flatus or bowel movement as well. Found to have SBO now s/p exploratory laparotomy with lysis of band adhesion causing a closed loop obstruction in right lower quadrant (on 11/19).   Spoke with patient at bedside. She reports prior to onset of her symptoms on 11/13 her appetite and intake were good. The usually has 3 meals per day and 2 snacks in between. Since her surgery on 11/19, patient has not had any BM and has only passed minimal flatus. She reports UBW was 178 lbs a few years ago and she has had gradual weight loss over the years. Per chart patient was 170 lbs one year ago, so she has lost 17 lbs (10% body weight) over one year, which is not significant for time frame.  Usual Intake: Breakfast - cheese toast with a packet of oatmeal and coffee Snack 1 - nabs, sandwich, or applesauce Lunch - broccoli or other green vegetables with rotisserie  chicken Snack 2 - nabs, sandwich, or applesauce Dinner - meat with vegetables  NG output: 200 ml on 11/21, 150 ml recorded so far today but on exam saw another 500 ml  Medications reviewed and include: Novolog sliding scale Q4hrs, pantoprazole, Zosyn, 1/2NS with KCl 20 mEq/L @ 100 ml/hr.  Labs reviewed: CBG 81-109 past 24 hrs. Last chem profile on 11/20 had CO2 21, BUN 21.  Nutrition-Focused physical exam completed. Findings are no fat depletion, no muscle depletion, and no edema.  Discussed with RN. The NG tube had slid out some and had to be reinserted, which is likely why she has more volume of output right now.  Diet Order:  Diet NPO time specified Except for: Ice Chips  Skin:  Reviewed, no issues  Last BM:  09/29/2016  Height:   Ht Readings from Last 1 Encounters:  10/02/16 5\' 8"  (1.727 m)    Weight:   Wt Readings from Last 1 Encounters:  10/02/16 153 lb 11.2 oz (69.7 kg)    Ideal Body Weight:  63.64 kg  BMI:  Body mass index is 23.37 kg/m.  Estimated Nutritional Needs:   Kcal:  1750-2000 (MSJ x 1.4-1.6)  Protein:  98-112 (1.4-1.6 grams/kg)  Fluid:  >/= 1.7 L/day (25 ml/kg)  EDUCATION NEEDS:   No education needs identified at this time  Willey Blade, MS, RD, LDN Pager: [phone] After Hours Pager: [phone]

## 2016-10-08 LAB — BASIC METABOLIC PANEL
ANION GAP: 6 (ref 5–15)
BUN: 15 mg/dL (ref 6–20)
CHLORIDE: 108 mmol/L (ref 101–111)
CO2: 24 mmol/L (ref 22–32)
CREATININE: 0.68 mg/dL (ref 0.44–1.00)
Calcium: 8.4 mg/dL — ABNORMAL LOW (ref 8.9–10.3)
GFR calc non Af Amer: >60 mL/min (ref >60)
Glucose, Bld: 81 mg/dL (ref 65–99)
POTASSIUM: 4 mmol/L (ref 3.5–5.1)
Sodium: 138 mmol/L (ref 135–145)

## 2016-10-08 LAB — GLUCOSE, CAPILLARY
GLUCOSE-CAPILLARY: 91 mg/dL (ref 65–99)
GLUCOSE-CAPILLARY: 123 mg/dL — AB (ref 65–99)
GLUCOSE-CAPILLARY: 112 mg/dL — AB (ref 65–99)
Glucose-Capillary: 122 mg/dL — ABNORMAL HIGH (ref 65–99)
Glucose-Capillary: 78 mg/dL (ref 65–99)
Glucose-Capillary: 91 mg/dL (ref 65–99)

## 2016-10-08 MED ORDER — HYDROCODONE-ACETAMINOPHEN 5-325 MG PO TABS
1.0000 | ORAL_TABLET | ORAL | Status: DC | PRN
  Administered 2016-10-08: 1 via ORAL
  Filled 2016-10-08: qty 1

## 2016-10-08 MED ORDER — BRIMONIDINE TARTRATE 0.15 % OP SOLN
1.0000 [drp] | Freq: Two times a day (BID) | OPHTHALMIC | Status: DC
  Administered 2016-10-08 – 2016-10-13 (×10): 1 [drp] via OPHTHALMIC
  Filled 2016-10-08: qty 5

## 2016-10-08 NOTE — Progress Notes (Signed)
4 Days Post-Op  Subjective: Status post exploratory laparotomy and lysis of adhesions for small bowel obstruction. Patient is passing gas  now and had a bowel movement  Objective: Vital signs in last 24 hours: Temp:  [97.4 F (36.3 C)-98.4 F (36.9 C)] 98.4 F (36.9 C) (11/23 0503) Pulse Rate:  [89-94] 89 (11/23 0503) Resp:  [17-19] 17 (11/23 0503) BP: (139-149)/(64-74) 149/66 (11/23 0503) SpO2:  [99 %-100 %] 100 % (11/23 0503) Last BM Date: 10/08/16  Intake/Output from previous day: 11/22 0701 - 11/23 0700 In: 4348.3 [I.V.:4183.3; NG/GT:50; IV Piggyback:115] Out: 710 [Urine:300; Emesis/NG output:410] Intake/Output this shift: Total I/O In: 133.3 [I.V.:133.3] Out: 40 [Emesis/NG output:40]  Physical exam:  Abdomen soft nondistended nontender wound is clean  Lab Results: CBC  No results for input(s): WBC, HGB, HCT, PLT in the last 72 hours. BMET  Recent Labs  10/08/16 0440  NA 138  K 4.0  CL 108  CO2 24  GLUCOSE 81  BUN 15  CREATININE 0.68  CALCIUM 8.4*   PT/INR No results for input(s): LABPROT, INR in the last 72 hours. ABG No results for input(s): PHART, HCO3 in the last 72 hours.  Invalid input(s): PCO2, PO2  Studies/Results: No results found.  Anti-infectives: Anti-infectives    Start     Dose/Rate Route Frequency Ordered Stop   10/02/16 2200  piperacillin-tazobactam (ZOSYN) IVPB 3.375 g     3.375 g 12.5 mL/hr over 240 Minutes Intravenous Every 8 hours 10/02/16 2031     10/02/16 2015  cefTRIAXone (ROCEPHIN) 2 g in dextrose 5 % 50 mL IVPB  Status:  Discontinued     2 g 100 mL/hr over 30 Minutes Intravenous Every 24 hours 10/02/16 2006 10/02/16 2030   10/02/16 2015  metroNIDAZOLE (FLAGYL) IVPB 500 mg  Status:  Discontinued     500 mg 100 mL/hr over 60 Minutes Intravenous Every 8 hours 10/02/16 2006 10/02/16 2030   10/02/16 1730  piperacillin-tazobactam (ZOSYN) IVPB 3.375 g     3.375 g 100 mL/hr over 30 Minutes Intravenous  Once 10/02/16 1720  10/02/16 1806   10/02/16 1730  metroNIDAZOLE (FLAGYL) IVPB 500 mg     500 mg 100 mL/hr over 60 Minutes Intravenous  Once 10/02/16 1720 10/02/16 1911      Assessment/Plan: s/p Procedure(s): EXPLORATORY LAPAROTOMY   Discontinue NG tube and start clear liquids ambulate.  Florene Glen, MD, FACS  10/08/2016

## 2016-10-08 NOTE — Progress Notes (Signed)
Patient only has IV pain med.  Dr Burt Knack gave orders for full liquid diet and Norco

## 2016-10-09 LAB — GLUCOSE, CAPILLARY
GLUCOSE-CAPILLARY: 122 mg/dL — AB (ref 65–99)
Glucose-Capillary: 102 mg/dL — ABNORMAL HIGH (ref 65–99)
Glucose-Capillary: 76 mg/dL (ref 65–99)
Glucose-Capillary: 87 mg/dL (ref 65–99)
Glucose-Capillary: 94 mg/dL (ref 65–99)
Glucose-Capillary: 95 mg/dL (ref 65–99)
Glucose-Capillary: 99 mg/dL (ref 65–99)

## 2016-10-09 LAB — CBC
HCT: 30.8 % — ABNORMAL LOW (ref 35.0–47.0)
Hemoglobin: 10.6 g/dL — ABNORMAL LOW (ref 12.0–16.0)
MCH: 31.6 pg (ref 26.0–34.0)
MCHC: 34.5 g/dL (ref 32.0–36.0)
MCV: 91.5 fL (ref 80.0–100.0)
PLATELETS: 209 10*3/uL (ref 150–440)
RBC: 3.36 MIL/uL — ABNORMAL LOW (ref 3.80–5.20)
RDW: 13.5 % (ref 11.5–14.5)
WBC: 7.7 10*3/uL (ref 3.6–11.0)

## 2016-10-09 NOTE — Progress Notes (Signed)
Physical Therapy Treatment Patient Details Name: Breanna Petty MRN: [id number] DOB: Aug 18, 1941 Today's Date: 10/09/2016    History of Present Illness Pt is a 75 y.o. female presenting to hospital with L sided abdominal pain.  Pt admitted with acute diverticulitis and SBO.  Pt s/p exploratory laparotomy 10/04/16 and NG tube placed on low continuous suction (now removed).  PMH includes anxiety, depression, DM, h/o shingles, htn, hiatal hernia, and cardiac surgery.    PT Comments    Pt limited with ambulation today d/t nausea (pt with recent nausea meds) but was able to transfer and ambulate in room/bathroom with CGA and no loss of balance noted (pt preferring to use B UE support on IV pole during session instead of RW).  Nursing reports pt ambulated around nursing loop earlier today.  Will continue to progress pt with increasing ambulation distance and transitioning pt to least restrictive AD per pt tolerance and comfort.   Follow Up Recommendations  No PT follow up     Equipment Recommendations   (TBD)    Recommendations for Other Services       Precautions / Restrictions Precautions Precautions: Fall Precaution Comments: abdominal incision; thin liquids Restrictions Weight Bearing Restrictions: No    Mobility  Bed Mobility Overal bed mobility: Needs Assistance Bed Mobility: Supine to Sit;Sit to Supine     Supine to sit: Supervision;HOB elevated Sit to supine: Supervision;HOB elevated   General bed mobility comments: increased effort and time to perform on own  Transfers Overall transfer level: Needs assistance Equipment used:  (UE support on IV pole) Transfers: Sit to/from [facility] Sit to Stand: Min guard Stand pivot transfers: Min guard (transfer to bed for clean-up d/t urinary incontinence; transfer to toilet after clean-up)       General transfer comment: CGA x3 repetitions; increased effort to stand but steady without loss of  balance  Ambulation/Gait Ambulation/Gait assistance: Min guard Ambulation Distance (Feet):  (10 feet (sat on bed for clean-up); 15 feet to bathroom; 60 feet in room) Assistive device:  (B UE support on IV pole)   Gait velocity: decreased   General Gait Details: decreased B step length/foot clearance/heelstrike; steady without loss of balance; limited distance d/t nausea   Stairs            Wheelchair Mobility    Modified Rankin (Stroke Patients Only)       Balance Overall balance assessment: Needs assistance Sitting-balance support: Feet supported;No upper extremity supported Sitting balance-Leahy Scale: Good     Standing balance support: Single extremity supported;During functional activity Standing balance-Leahy Scale: Good Standing balance comment: standing doffing/then donning underwear                    Cognition Arousal/Alertness: Awake/alert Behavior During Therapy: WFL for tasks assessed/performed Overall Cognitive Status: Within Functional Limits for tasks assessed                      Exercises      General Comments General comments (skin integrity, edema, etc.): Pt resting in bed upon PT arrival.  Nursing cleared pt for participation in physical therapy.  Pt agreeable to PT session with encouragement.      Pertinent Vitals/Pain Pain Assessment: No/denies pain  Vitals (HR and O2 on room air) stable and WFL throughout treatment session.    Home Living                      Prior Function  PT Goals (current goals can now be found in the care plan section) Acute Rehab PT Goals Patient Stated Goal: to go home PT Goal Formulation: With patient Time For Goal Achievement: 10/21/16 Potential to Achieve Goals: Good Progress towards PT goals: Progressing toward goals    Frequency    Min 2X/week      PT Plan Current plan remains appropriate    Co-evaluation             End of Session Equipment  Utilized During Treatment: Gait belt (up high) Activity Tolerance: Patient limited by fatigue;Other (comment) (Limited d/t nausea (pt with recent nausea meds per nursing)) Patient left: in bed;with call bell/phone within reach;with bed alarm set     Time: 1506-1530 PT Time Calculation (min) (ACUTE ONLY): 24 min  Charges:  $Therapeutic Activity: 23-37 mins                    G CodesLeitha Bleak 17-Oct-2016, 3:46 PM Leitha Bleak, [place]

## 2016-10-09 NOTE — Plan of Care (Signed)
Problem: Activity: Goal: Risk for activity intolerance will decrease Outcome: Progressing Pt ambulated once around the entire unit with NT th is morning.

## 2016-10-09 NOTE — Progress Notes (Signed)
5 Days Post-Op  Subjective: Patient had her nasogastric tube discontinued yesterday and her diet advanced to clear liquids. She vomited last night. She has an ill feeling this morning and has not passed any gas this morning.  Objective: Vital signs in last 24 hours: Temp:  [97.9 F (36.6 C)-98.1 F (36.7 C)] 97.9 F (36.6 C) (11/24 0449) Pulse Rate:  [82-91] 86 (11/24 0449) Resp:  [18-20] 18 (11/24 0449) BP: (141-190)/(67-87) 143/74 (11/24 0449) SpO2:  [99 %-100 %] 100 % (11/24 0449) Last BM Date: 10/08/16  Intake/Output from previous day: 11/23 0701 - 11/24 0700 In: 2675 [P.O.:420; I.V.:2113; IV Piggyback:142] Out: 890 [Urine:850; Emesis/NG output:40] Intake/Output this shift: No intake/output data recorded.  Physical exam:  Soft abdomen slightly distended nontender wound is clean. Nontender calves.  Lab Results: CBC   Recent Labs  10/09/16 0609  WBC 7.7  HGB 10.6*  HCT 30.8*  PLT 209   BMET  Recent Labs  10/08/16 0440  NA 138  K 4.0  CL 108  CO2 24  GLUCOSE 81  BUN 15  CREATININE 0.68  CALCIUM 8.4*   PT/INR No results for input(s): LABPROT, INR in the last 72 hours. ABG No results for input(s): PHART, HCO3 in the last 72 hours.  Invalid input(s): PCO2, PO2  Studies/Results: No results found.  Anti-infectives: Anti-infectives    Start     Dose/Rate Route Frequency Ordered Stop   10/02/16 2200  piperacillin-tazobactam (ZOSYN) IVPB 3.375 g     3.375 g 12.5 mL/hr over 240 Minutes Intravenous Every 8 hours 10/02/16 2031     10/02/16 2015  cefTRIAXone (ROCEPHIN) 2 g in dextrose 5 % 50 mL IVPB  Status:  Discontinued     2 g 100 mL/hr over 30 Minutes Intravenous Every 24 hours 10/02/16 2006 10/02/16 2030   10/02/16 2015  metroNIDAZOLE (FLAGYL) IVPB 500 mg  Status:  Discontinued     500 mg 100 mL/hr over 60 Minutes Intravenous Every 8 hours 10/02/16 2006 10/02/16 2030   10/02/16 1730  piperacillin-tazobactam (ZOSYN) IVPB 3.375 g     3.375 g 100  mL/hr over 30 Minutes Intravenous  Once 10/02/16 1720 10/02/16 1806   10/02/16 1730  metroNIDAZOLE (FLAGYL) IVPB 500 mg     500 mg 100 mL/hr over 60 Minutes Intravenous  Once 10/02/16 1720 10/02/16 1911      Assessment/Plan: s/p Procedure(s): EXPLORATORY LAPAROTOMY   We'll continue IV fluids and not advance her diet just yet I want to make sure she is not going to vomit again.  Florene Glen, MD, FACS  10/09/2016

## 2016-10-10 LAB — GLUCOSE, CAPILLARY
GLUCOSE-CAPILLARY: 128 mg/dL — AB (ref 65–99)
GLUCOSE-CAPILLARY: 120 mg/dL — AB (ref 65–99)
Glucose-Capillary: 87 mg/dL (ref 65–99)
Glucose-Capillary: 75 mg/dL (ref 65–99)
Glucose-Capillary: 109 mg/dL — ABNORMAL HIGH (ref 65–99)

## 2016-10-10 NOTE — Progress Notes (Signed)
6 Days Post-Op  Subjective: Postop lysis of adhesions feels well passing gas better today no nausea or vomiting.  Objective: Vital signs in last 24 hours: Temp:  [97.9 F (36.6 C)-98.6 F (37 C)] 97.9 F (36.6 C) (11/25 0422) Pulse Rate:  [80-89] 89 (11/25 0422) Resp:  [16-24] 20 (11/25 0422) BP: (144-181)/(69-75) 144/75 (11/25 0422) SpO2:  [99 %-100 %] 100 % (11/25 0422) Last BM Date: 10/10/16  Intake/Output from previous day: 11/24 0701 - 11/25 0700 In: 1454.7 [I.V.:1454.7] Out: 1825 [Urine:1825] Intake/Output this shift: Total I/O In: 240 [P.O.:240] Out: -   Physical exam:  Soft abdomen wound clean no erythema or drainage  Lab Results: CBC   Recent Labs  10/09/16 0609  WBC 7.7  HGB 10.6*  HCT 30.8*  PLT 209   BMET  Recent Labs  10/08/16 0440  NA 138  K 4.0  CL 108  CO2 24  GLUCOSE 81  BUN 15  CREATININE 0.68  CALCIUM 8.4*   PT/INR No results for input(s): LABPROT, INR in the last 72 hours. ABG No results for input(s): PHART, HCO3 in the last 72 hours.  Invalid input(s): PCO2, PO2  Studies/Results: No results found.  Anti-infectives: Anti-infectives    Start     Dose/Rate Route Frequency Ordered Stop   10/02/16 2200  piperacillin-tazobactam (ZOSYN) IVPB 3.375 g  Status:  Discontinued     3.375 g 12.5 mL/hr over 240 Minutes Intravenous Every 8 hours 10/02/16 2031 10/09/16 0847   10/02/16 2015  cefTRIAXone (ROCEPHIN) 2 g in dextrose 5 % 50 mL IVPB  Status:  Discontinued     2 g 100 mL/hr over 30 Minutes Intravenous Every 24 hours 10/02/16 2006 10/02/16 2030   10/02/16 2015  metroNIDAZOLE (FLAGYL) IVPB 500 mg  Status:  Discontinued     500 mg 100 mL/hr over 60 Minutes Intravenous Every 8 hours 10/02/16 2006 10/02/16 2030   10/02/16 1730  piperacillin-tazobactam (ZOSYN) IVPB 3.375 g     3.375 g 100 mL/hr over 30 Minutes Intravenous  Once 10/02/16 1720 10/02/16 1806   10/02/16 1730  metroNIDAZOLE (FLAGYL) IVPB 500 mg     500 mg 100 mL/hr  over 60 Minutes Intravenous  Once 10/02/16 1720 10/02/16 1911      Assessment/Plan: s/p Procedure(s): EXPLORATORY LAPAROTOMY   Plan to advance diet today and probably discharge tomorrow.  Florene Glen, MD, FACS  10/10/2016

## 2016-10-11 LAB — GLUCOSE, CAPILLARY
GLUCOSE-CAPILLARY: 103 mg/dL — AB (ref 65–99)
GLUCOSE-CAPILLARY: 110 mg/dL — AB (ref 65–99)
GLUCOSE-CAPILLARY: 118 mg/dL — AB (ref 65–99)
GLUCOSE-CAPILLARY: 120 mg/dL — AB (ref 65–99)
Glucose-Capillary: 143 mg/dL — ABNORMAL HIGH (ref 65–99)
Glucose-Capillary: 129 mg/dL — ABNORMAL HIGH (ref 65–99)

## 2016-10-11 MED ORDER — PANTOPRAZOLE SODIUM 40 MG PO TBEC
40.0000 mg | DELAYED_RELEASE_TABLET | Freq: Every day | ORAL | Status: DC
  Administered 2016-10-11 – 2016-10-12 (×2): 40 mg via ORAL
  Filled 2016-10-11 (×2): qty 1

## 2016-10-11 NOTE — Progress Notes (Signed)
Key Points: Use following P&T approved IV to PO antibiotic change policy.  Description contains the criteria that are approved Note: Policy Excludes:  Esophagectomy patientsPHARMACIST - PHYSICIAN COMMUNICATION DR:   Hampton Abbot CONCERNING: IV to Oral Route Change Policy  RECOMMENDATION: This patient is receiving pantoprazole by the intravenous route.  Based on criteria approved by the Pharmacy and Therapeutics Committee, the intravenous medication(s) is/are being converted to the equivalent oral dose form(s).   DESCRIPTION: These criteria include:  The patient is eating (either orally or via tube) and/or has been taking other orally administered medications for a least 24 hours  The patient has no evidence of active gastrointestinal bleeding or impaired GI absorption (gastrectomy, short bowel, patient on TNA or NPO).  If you have questions about this conversion, please contact the [place], [facility] 10/11/2016 1:37 PM

## 2016-10-11 NOTE — Progress Notes (Signed)
7 Days Post-Op  Subjective: Status post lysis of adhesions. Patient feeling much better today tolerating a diet and having bowel movements and passing flatus she has minimal pain. She lives alone her daughter lives next door but works.  Objective: Vital signs in last 24 hours: Temp:  [98 F (36.7 C)-98.7 F (37.1 C)] 98.4 F (36.9 C) (11/26 0913) Pulse Rate:  [83-93] 93 (11/26 0913) Resp:  [15-20] 20 (11/26 0913) BP: (168-183)/(67-85) 179/85 (11/26 0913) SpO2:  [98 %-100 %] 100 % (11/26 0913) Last BM Date: 10/11/16  Intake/Output from previous day: 11/25 0701 - 11/26 0700 In: 420 [P.O.:420] Out: 200 [Urine:200] Intake/Output this shift: No intake/output data recorded.  Physical exam:  Abdomen is soft less distended nontender nontympanitic wounds clean no erythema or drainage nontender calves  Lab Results: CBC   Recent Labs  10/09/16 0609  WBC 7.7  HGB 10.6*  HCT 30.8*  PLT 209   BMET No results for input(s): NA, K, CL, CO2, GLUCOSE, BUN, CREATININE, CALCIUM in the last 72 hours. PT/INR No results for input(s): LABPROT, INR in the last 72 hours. ABG No results for input(s): PHART, HCO3 in the last 72 hours.  Invalid input(s): PCO2, PO2  Studies/Results: No results found.  Anti-infectives: Anti-infectives    Start     Dose/Rate Route Frequency Ordered Stop   10/02/16 2200  piperacillin-tazobactam (ZOSYN) IVPB 3.375 g  Status:  Discontinued     3.375 g 12.5 mL/hr over 240 Minutes Intravenous Every 8 hours 10/02/16 2031 10/09/16 0847   10/02/16 2015  cefTRIAXone (ROCEPHIN) 2 g in dextrose 5 % 50 mL IVPB  Status:  Discontinued     2 g 100 mL/hr over 30 Minutes Intravenous Every 24 hours 10/02/16 2006 10/02/16 2030   10/02/16 2015  metroNIDAZOLE (FLAGYL) IVPB 500 mg  Status:  Discontinued     500 mg 100 mL/hr over 60 Minutes Intravenous Every 8 hours 10/02/16 2006 10/02/16 2030   10/02/16 1730  piperacillin-tazobactam (ZOSYN) IVPB 3.375 g     3.375 g 100  mL/hr over 30 Minutes Intravenous  Once 10/02/16 1720 10/02/16 1806   10/02/16 1730  metroNIDAZOLE (FLAGYL) IVPB 500 mg     500 mg 100 mL/hr over 60 Minutes Intravenous  Once 10/02/16 1720 10/02/16 1911      Assessment/Plan: s/p Procedure(s): EXPLORATORY LAPAROTOMY   Patient doing quite well at this point she is ready for discharge but because she is living alone and her daughter were lives next door works it will be difficult to send her home to her regular place of living. I would like to try to arrange for outpatient physical therapy via skilled nursing facility. She would benefit from this greatly.  Florene Glen, MD, FACS  10/11/2016

## 2016-10-11 NOTE — Clinical Social Work Note (Signed)
CSW received consult for nursing home placement. PT recommends no PT follow-up. CSW involvement not indicated at this time. Please re-consult if change in disposition. CSW signing off.  Santiago Bumpers, MSW, LCSW-A [phone]

## 2016-10-12 LAB — GLUCOSE, CAPILLARY
GLUCOSE-CAPILLARY: 127 mg/dL — AB (ref 65–99)
GLUCOSE-CAPILLARY: 120 mg/dL — AB (ref 65–99)
GLUCOSE-CAPILLARY: 137 mg/dL — AB (ref 65–99)
Glucose-Capillary: 103 mg/dL — ABNORMAL HIGH (ref 65–99)
Glucose-Capillary: 109 mg/dL — ABNORMAL HIGH (ref 65–99)
Glucose-Capillary: 95 mg/dL (ref 65–99)

## 2016-10-12 MED ORDER — NORTRIPTYLINE HCL 10 MG PO CAPS
10.0000 mg | ORAL_CAPSULE | Freq: Every day | ORAL | Status: DC
  Administered 2016-10-12: 10 mg via ORAL
  Filled 2016-10-12: qty 1

## 2016-10-12 MED ORDER — AMLODIPINE BESYLATE 5 MG PO TABS
5.0000 mg | ORAL_TABLET | Freq: Every day | ORAL | Status: DC
  Administered 2016-10-12 – 2016-10-13 (×2): 5 mg via ORAL
  Filled 2016-10-12 (×2): qty 1

## 2016-10-12 MED ORDER — ASPIRIN EC 81 MG PO TBEC
81.0000 mg | DELAYED_RELEASE_TABLET | Freq: Every day | ORAL | Status: DC
  Administered 2016-10-12 – 2016-10-13 (×2): 81 mg via ORAL
  Filled 2016-10-12 (×2): qty 1

## 2016-10-12 MED ORDER — LINAGLIPTIN 5 MG PO TABS: 5.0000 mg | ORAL_TABLET | Freq: Every day | ORAL | Status: DC

## 2016-10-12 MED ORDER — LOSARTAN POTASSIUM 50 MG PO TABS
50.0000 mg | ORAL_TABLET | Freq: Every day | ORAL | Status: DC
  Administered 2016-10-12 – 2016-10-13 (×2): 50 mg via ORAL
  Filled 2016-10-12 (×2): qty 1

## 2016-10-12 MED ORDER — PRAVASTATIN SODIUM 40 MG PO TABS
40.0000 mg | ORAL_TABLET | Freq: Every day | ORAL | Status: DC
  Administered 2016-10-12: 40 mg via ORAL
  Filled 2016-10-12: qty 1

## 2016-10-12 NOTE — Progress Notes (Signed)
10/12/2016  Subjective: Patient is 8 Days Post-Op status post exploratory laparotomy with lysis of adhesions. She has been doing well tolerating a diet and ambulating. She has been requiring some assistance. Denies any significant pain.  Vital signs: Temp:  [98 F (36.7 C)-98.7 F (37.1 C)] 98.3 F (36.8 C) (11/27 1422) Pulse Rate:  [86-96] 90 (11/27 1422) Resp:  [17-20] 17 (11/27 1422) BP: (156-180)/(67-78) 157/71 (11/27 1422) SpO2:  [97 %-98 %] 97 % (11/27 1422) Weight:  [75.5 kg (166 lb 8 oz)] 75.5 kg (166 lb 8 oz) (11/27 0556)   Intake/Output: 11/26 0701 - 11/27 0700 In: 30 [P.O.:30] Out: -  Last BM Date: 10/12/16  Physical Exam: Constitutional: No acute distress Abdomen: Soft, nondistended, appropriately tender to palpation. Incision in the midline is clean dry and intact with staples.  Labs:  No results for input(s): WBC, HGB, HCT, PLT in the last 72 hours. No results for input(s): NA, K, CL, CO2, GLUCOSE, BUN, CREATININE, CALCIUM in the last 72 hours.  Invalid input(s): MAGNESIUM No results for input(s): LABPROT, INR in the last 72 hours.  Imaging: No results found.  Assessment/Plan: 75 year old female status post expiratory laparotomy with lysis of adhesions for small bowel obstruction.  -Patient will be reassessed today by physical therapy for possible home health needs. -Likely to be discharged to home tomorrow.   Melvyn Neth, [place]

## 2016-10-12 NOTE — Progress Notes (Signed)
Physical Therapy Treatment Patient Details Name: Breanna Petty MRN: [id number] DOB: Aug 28, 1941 Today's Date: 10/12/2016    History of Present Illness Pt is a 75 y.o. female presenting to hospital with L sided abdominal pain.  Pt admitted with acute diverticulitis and SBO.  Pt s/p exploratory laparotomy 10/04/16 and NG tube placed on low continuous suction (now removed).  PMH includes anxiety, depression, DM, h/o shingles, htn, hiatal hernia, and cardiac surgery.    PT Comments    Pt able to ambulate around nursing loop with CGA and no AD; mildly unsteady at times but no overt loss of balance noted.  Pt performed standing LE ex's after ambulation (ex's with UE support) but required pacing and rest breaks d/t fatigue.  Recommend pt discharge home with HHPT.  Will continue to focus on balance and overall strengthening during hospitalization.   Follow Up Recommendations  Home health PT ((Discussed with CM))     Equipment Recommendations  None recommended by PT    Recommendations for Other Services       Precautions / Restrictions Precautions Precautions: Fall Precaution Comments: abdominal incision Restrictions Weight Bearing Restrictions: No    Mobility  Bed Mobility Overal bed mobility: Modified Independent Bed Mobility: Supine to Sit;Sit to Supine     Supine to sit: Modified independent (Device/Increase time) Sit to supine: Modified independent (Device/Increase time)   General bed mobility comments: no difficulties noted  Transfers Overall transfer level: Needs assistance Equipment used: None Transfers: Sit to/from Stand Sit to Stand: Supervision         General transfer comment: x4 repetitions; steady without loss of balance  Ambulation/Gait Ambulation/Gait assistance: Min guard Ambulation Distance (Feet): 200 Feet Assistive device: None   Gait velocity: decreased   General Gait Details: decreased B step length/foot clearance/heelstrike; mildly unsteady  at times but no overt loss of balance noted requiring assistance to steady; pt initially holding onto railing in hallway but given vc's for no UE support   Stairs Stairs:  (pt declined)          Wheelchair Mobility    Modified Rankin (Stroke Patients Only)       Balance Overall balance assessment: Needs assistance Sitting-balance support: No upper extremity supported;Feet supported Sitting balance-Leahy Scale: Normal     Standing balance support: No upper extremity supported;During functional activity Standing balance-Leahy Scale: Good Standing balance comment: with ambulation                    Cognition Arousal/Alertness: Awake/alert Behavior During Therapy: WFL for tasks assessed/performed Overall Cognitive Status: Within Functional Limits for tasks assessed                      Exercises General Exercises - Lower Extremity Hip ABduction/ADduction: AROM;Strengthening;Both;Standing (UE support; 10 reps x2) Hip Flexion/Marching: AROM;Strengthening;Both;Standing (UE support for balance; 10 reps x2) Heel Raises: AROM;Strengthening;Both;Standing (UE support; 10 reps x2)  Vc's required for ex technique.    General Comments General comments (skin integrity, edema, etc.): Pt talking on phone laying in bed when PT arrived.  Nursing cleared pt for participation in physical therapy.  Pt agreeable to PT session.      Pertinent Vitals/Pain Pain Assessment: No/denies pain  Vitals (HR and O2) stable and WFL throughout treatment session.    Home Living                      Prior Function  PT Goals (current goals can now be found in the care plan section) Acute Rehab PT Goals Patient Stated Goal: to go home PT Goal Formulation: With patient Time For Goal Achievement: 10/21/16 Potential to Achieve Goals: Good Progress towards PT goals: Progressing toward goals    Frequency    Min 2X/week      PT Plan Discharge plan needs to be  updated    Co-evaluation             End of Session Equipment Utilized During Treatment: Gait belt (up high) Activity Tolerance: Patient limited by fatigue Patient left: in bed;with call bell/phone within reach;with bed alarm set     Time: 1350-1413 PT Time Calculation (min) (ACUTE ONLY): 23 min  Charges:  $Therapeutic Exercise: 8-22 mins $Therapeutic Activity: 8-22 mins                    G CodesLeitha Bleak 21-Oct-2016, 3:12 PM Leitha Bleak, [place]

## 2016-10-12 NOTE — Progress Notes (Signed)
Nutrition Follow-up  DOCUMENTATION CODES:   Not applicable  INTERVENTION:  1. Diet advanced to SOFT, patient currently tolerating. 2. Monitor need for ONS  NUTRITION DIAGNOSIS:   Inadequate oral intake related to inability to eat as evidenced by NPO status. -resolved  GOAL:   Patient will meet greater than or equal to 90% of their needs -progressing  MONITOR:   Diet advancement, Labs, Weight trends, I & O's  REASON FOR ASSESSMENT:   NPO/Clear Liquid Diet    ASSESSMENT:   75 y.o. female who presents with a five-day history of abdominal pain. Patient reports she started having some mild pain on the left side on 11/13 and then the next day became very severe. This was associated with episodes of nausea and vomiting on that same day. Since then she has remained nauseous but has not had any other episodes of emesis. That was also the last day that she had flatus or bowel movement as well. Found to have SBO now s/p exploratory laparotomy with lysis of band adhesion causing a closed loop obstruction in right lower quadrant (on 11/19).  Ms. Mcgahan is now having BMs, flatus, tolerating soft diet s/p lysis of adhesions. She slept through breakfast this morning. Ready for discharge per MD. Wt stable Monitor PO intake. Labs and medications reviewed  Diet Order:  DIET SOFT Room service appropriate? Yes; Fluid consistency: Thin  Skin:  Reviewed, no issues  Last BM:  11/27  Height:   Ht Readings from Last 1 Encounters:  10/02/16 5\' 8"  (1.727 m)    Weight:   Wt Readings from Last 1 Encounters:  10/12/16 166 lb 8 oz (75.5 kg)    Ideal Body Weight:  63.64 kg  BMI:  Body mass index is 25.32 kg/m.  Estimated Nutritional Needs:   Kcal:  1750-2000 (MSJ x 1.4-1.6)  Protein:  98-112 (1.4-1.6 grams/kg)  Fluid:  >/= 1.7 L/day (25 ml/kg)  EDUCATION NEEDS:   No education needs identified at this time  Satira Anis. Elyce Zollinger, MS, RD LDN Inpatient Clinical Dietitian Pager  [phone]

## 2016-10-13 LAB — GLUCOSE, CAPILLARY
GLUCOSE-CAPILLARY: 101 mg/dL — AB (ref 65–99)
GLUCOSE-CAPILLARY: 95 mg/dL (ref 65–99)
Glucose-Capillary: 105 mg/dL — ABNORMAL HIGH (ref 65–99)
Glucose-Capillary: 151 mg/dL — ABNORMAL HIGH (ref 65–99)

## 2016-10-13 MED ORDER — HYDROCODONE-ACETAMINOPHEN 5-325 MG PO TABS: 1.0000 | ORAL_TABLET | ORAL | 0 refills | Status: DC | PRN

## 2016-10-13 NOTE — Care Management (Signed)
Patient to be discharged home today. PT has assessed patient and recommended home health PT.  Patient was offered home health agency list.  [facility].  Corene Cornea with [facility] Notified of of referral for PT.  RNCM signing off.

## 2016-10-13 NOTE — Discharge Summary (Signed)
Patient ID: Breanna Petty MRN: [id number] DOB/AGE: [birth year] 75 y.o.  Admit date: 10/02/2016 Discharge date: 10/13/2016   Discharge Diagnoses:  Active Problems:   Acute diverticulitis   SBO (small bowel obstruction)   Procedures:  Exploratory laparotomy with lysis of adhesions  Hospital Course: Patient was admitted on 11/17 with a small bowel obstruction with possible acute diverticulitis. She was started on IV antibiotics and had an NG tube in place. Her abdominal exam was not improving and on 11/19 she was taken to the operating room with Dr. Pat Patrick for expiratory laparotomy and lysis of adhesions. No bowel needed to be resected. Postoperatively she was advanced very slowly and remain with an NG tube initially and subsequently was removed and advanced up to a soft diet which she is tolerating well today. Her incision has been clean dry and intact with staples with only mild serous drainage in between staples. There is been no evidence of infection. Her antibiotic course was completed. She has been ambulating and working with physical therapy who recommended home health physical therapy services. Overall she's tolerating a diet, ambulating with her pain well controlled, voiding and having bowel movements, and has been deemed ready for discharge to home.  Consults: None  Disposition: 01-Home or Self Care  Discharge Instructions    Call MD for:  difficulty breathing, headache or visual disturbances    Complete by:  As directed    Call MD for:  persistant nausea and vomiting    Complete by:  As directed    Call MD for:  redness, tenderness, or signs of infection (pain, swelling, redness, odor or green/yellow discharge around incision site)    Complete by:  As directed    Call MD for:  severe uncontrolled pain    Complete by:  As directed    Call MD for:  temperature >100.4    Complete by:  As directed    Diet - low sodium heart healthy    Complete by:  As directed    Discharge  instructions    Complete by:  As directed    Patient may shower but do not scrub wounds heavily and dab dry only. Do not remove staples.   Discharge wound care:    Complete by:  As directed    Do not remove staples. May apply dry gauze dressing if any oozing from the wound.   Driving Restrictions    Complete by:  As directed    Do not drive while taking narcotics for pain control.   Increase activity slowly    Complete by:  As directed    Lifting restrictions    Complete by:  As directed    No heavy lifting of more than 10-15 pounds for 4 weeks       Medication List    TAKE these medications   amLODipine 5 MG tablet Commonly known as:  NORVASC Take 1 tablet (5 mg total) by mouth daily.   aspirin EC 81 MG tablet Take 1 tablet by mouth daily.   BAYER CONTOUR NEXT TEST test strip Generic drug:  glucose blood TEST 2 TIMES A DAY   brimonidine 0.1 % Soln Commonly known as:  ALPHAGAN P 1 drop Nightly.   HYDROcodone-acetaminophen 5-325 MG tablet Commonly known as:  NORCO/VICODIN Take 1-2 tablets by mouth every 4 (four) hours as needed for moderate pain.   latanoprost 0.005 % ophthalmic solution Commonly known as:  XALATAN 2 drops at bedtime.   losartan 50 MG tablet Commonly  known as:  COZAAR Take 1 tablet (50 mg total) by mouth daily.   NITROSTAT 0.4 MG SL tablet Generic drug:  nitroGLYCERIN DISSOLVE ONE TABLET UNDER TONGUE AS NEED FOR CHEST PAIN AS DIRECTED   nortriptyline 10 MG capsule Commonly known as:  PAMELOR Take 1 capsule (10 mg total) by mouth at bedtime.   omeprazole 20 MG capsule Commonly known as:  PRILOSEC Take 1 capsule (20 mg total) by mouth 2 (two) times daily.   pravastatin 40 MG tablet Commonly known as:  PRAVACHOL Take 1 tablet (40 mg total) by mouth daily.   sitaGLIPtin 100 MG tablet Commonly known as:  JANUVIA Take 1 tablet (100 mg total) by mouth daily.      Follow-up [facility], MD Follow up in 1 week(s).    Specialty:  Surgery Contact information: [address]  [address] [place] [address] [phone]

## 2016-10-13 NOTE — Progress Notes (Signed)
10/13/2016  Subjective: Patient is 9 Days Post-Op s/p exploratory laparotomy with lysis of adhesions.  No acute events overnight. Denies having any pain. Reports some mild serous drainage from the midline incision. Tolerating diet and having bowel movements.  Vital signs: Temp:  [98.3 F (36.8 C)-98.5 F (36.9 C)] 98.4 F (36.9 C) (11/28 0424) Pulse Rate:  [90-109] 109 (11/28 0424) Resp:  [17-20] 18 (11/28 0424) BP: (146-171)/(56-86) 157/86 (11/28 0424) SpO2:  [97 %-99 %] 99 % (11/28 0424)   Intake/Output: 11/27 0701 - 11/28 0700 In: 240 [P.O.:240] Out: -  Last BM Date: 10/12/16  Physical Exam: Constitutional: No acute distress Abdomen: Soft, nondistended, nontender to palpation. Midline incision is clean dry and intact with staples  Labs:  No results for input(s): WBC, HGB, HCT, PLT in the last 72 hours. No results for input(s): NA, K, CL, CO2, GLUCOSE, BUN, CREATININE, CALCIUM in the last 72 hours.  Invalid input(s): MAGNESIUM No results for input(s): LABPROT, INR in the last 72 hours.  Imaging: No results found.  Assessment/Plan: 75 year old female status post exploratory laparotomy with lysis of adhesions  -We'll discharge to home today with home health physical therapy services   Melvyn Neth, [place]

## 2016-10-13 NOTE — Progress Notes (Signed)
Patient discharged to home. IV site removed. Concerns addressed. Prescription given to patient. Gauze dressing given to cover abdomen prn.

## 2016-10-14 ENCOUNTER — Ambulatory Visit: Payer: Self-pay | Admitting: Pharmacist

## 2016-10-14 DIAGNOSIS — Z48815 Encounter for surgical aftercare following surgery on the digestive system: Secondary | ICD-10-CM | POA: Diagnosis not present

## 2016-10-14 DIAGNOSIS — K5780 Diverticulitis of intestine, part unspecified, with perforation and abscess without bleeding: Secondary | ICD-10-CM | POA: Diagnosis not present

## 2016-10-14 DIAGNOSIS — E119 Type 2 diabetes mellitus without complications: Secondary | ICD-10-CM | POA: Diagnosis not present

## 2016-10-16 DIAGNOSIS — E119 Type 2 diabetes mellitus without complications: Secondary | ICD-10-CM | POA: Diagnosis not present

## 2016-10-16 DIAGNOSIS — K5780 Diverticulitis of intestine, part unspecified, with perforation and abscess without bleeding: Secondary | ICD-10-CM | POA: Diagnosis not present

## 2016-10-16 DIAGNOSIS — Z48815 Encounter for surgical aftercare following surgery on the digestive system: Secondary | ICD-10-CM | POA: Diagnosis not present

## 2016-10-19 ENCOUNTER — Encounter: Payer: 59 | Admitting: Pharmacist

## 2016-10-20 DIAGNOSIS — I219 Acute myocardial infarction, unspecified: Secondary | ICD-10-CM | POA: Insufficient documentation

## 2016-10-21 ENCOUNTER — Encounter: Payer: Self-pay | Admitting: Surgery

## 2016-10-21 ENCOUNTER — Ambulatory Visit (INDEPENDENT_AMBULATORY_CARE_PROVIDER_SITE_OTHER): Payer: 59 | Admitting: Surgery

## 2016-10-21 DIAGNOSIS — I2510 Atherosclerotic heart disease of native coronary artery without angina pectoris: Secondary | ICD-10-CM

## 2016-10-21 DIAGNOSIS — Z9889 Other specified postprocedural states: Secondary | ICD-10-CM

## 2016-10-21 NOTE — Patient Instructions (Signed)
Please call our office with any questions or concerns.  You may now take a bath, get in a hot tub, or pool. Your wounds are completely sealed.   Use sun block to incision area over the next year if this area will be exposed to sun. This helps decrease scarring.  You may resume your normal activities on 11/01/16. Listen to your body when lifting, if you have pain when lifting, stop and then try again in a few days. Please see work note provided. If you still feel weak at this time, please let us know so that we may have you seen by a surgeon.  If you develop redness, drainage, or pain at incision sites- call our office immediately and speak with a nurse.

## 2016-10-21 NOTE — Progress Notes (Signed)
10/21/2016  HPI: This a 75 year old female status post exploratory laparotomy with lysis of adhesions for a small bowel obstruction with Dr. Pat Patrick on 11/19. She was discharged from the hospital on 11/28 to home with outpatient physical therapy. She presents today for postop follow-up appointment. She reports that she's been doing well working well with physical therapy and regaining her strength. She does still feel weak but this has been improving. Her appetite is also improving. She denies any significant pain and has not required any pain medication. Denies any nausea, vomiting, fevers, chills.  Vital signs: BP 114/70   Pulse (!) 101   Temp 98 F (36.7 C) (Oral)   Ht 5\' 8"  (1.727 m)   Wt 67.6 kg (149 lb)   BMI 22.66 kg/m    Physical Exam: Constitutional: No acute distress Abdomen:  Soft, nondistended, nontender to palpation. Incision is clean dry and intact with staples in place. There is no evidence of infection.  Assessment/Plan: 75 year old female status post exploratory laparotomy with lysis of adhesions.  -Have removed his staples today and change them to Steri-Strips.  -Patient may bathe as well. -Patient still has no heavy lifting restriction of more than 10-15 pounds until 12/17. She may return to work with full duties at that point. A work note will be given to the patient today. -As she continues to do well she may follow-up with [place] on an as-needed basis. The patient is aware though that if any issues or concerns that she can call to set up an appointment.   Melvyn Neth, [place]

## 2016-10-22 DIAGNOSIS — K5780 Diverticulitis of intestine, part unspecified, with perforation and abscess without bleeding: Secondary | ICD-10-CM | POA: Diagnosis not present

## 2016-10-22 DIAGNOSIS — Z48815 Encounter for surgical aftercare following surgery on the digestive system: Secondary | ICD-10-CM | POA: Diagnosis not present

## 2016-10-22 DIAGNOSIS — E119 Type 2 diabetes mellitus without complications: Secondary | ICD-10-CM | POA: Diagnosis not present

## 2016-10-23 ENCOUNTER — Telehealth: Payer: Self-pay

## 2016-10-23 DIAGNOSIS — E119 Type 2 diabetes mellitus without complications: Secondary | ICD-10-CM | POA: Diagnosis not present

## 2016-10-23 DIAGNOSIS — K5780 Diverticulitis of intestine, part unspecified, with perforation and abscess without bleeding: Secondary | ICD-10-CM | POA: Diagnosis not present

## 2016-10-23 DIAGNOSIS — Z48815 Encounter for surgical aftercare following surgery on the digestive system: Secondary | ICD-10-CM | POA: Diagnosis not present

## 2016-10-23 NOTE — Telephone Encounter (Signed)
I called the patient to collect a payment for her paperwork that was just submitted. Patient does not have a form of payment on her that can be made over the phone and she can not come into the office today. She will come in the office on Monday afternoon to make this payment.

## 2016-10-23 NOTE — Telephone Encounter (Signed)
Patient's disability forms were filled out and faxed to [employer].

## 2016-10-27 NOTE — Telephone Encounter (Signed)
Patient came into the office and paid for her Disability/ FMLA forms

## 2016-10-28 DIAGNOSIS — Z48815 Encounter for surgical aftercare following surgery on the digestive system: Secondary | ICD-10-CM | POA: Diagnosis not present

## 2016-10-28 DIAGNOSIS — K5780 Diverticulitis of intestine, part unspecified, with perforation and abscess without bleeding: Secondary | ICD-10-CM | POA: Diagnosis not present

## 2016-10-28 DIAGNOSIS — E119 Type 2 diabetes mellitus without complications: Secondary | ICD-10-CM | POA: Diagnosis not present

## 2016-11-02 DIAGNOSIS — H579 Unspecified disorder of eye and adnexa: Secondary | ICD-10-CM | POA: Diagnosis not present

## 2016-11-02 DIAGNOSIS — H5711 Ocular pain, right eye: Secondary | ICD-10-CM | POA: Diagnosis not present

## 2016-11-25 ENCOUNTER — Other Ambulatory Visit: Payer: Self-pay | Admitting: Family Medicine

## 2016-11-25 ENCOUNTER — Ambulatory Visit: Payer: Self-pay | Admitting: Pharmacist

## 2016-11-25 DIAGNOSIS — Z1231 Encounter for screening mammogram for malignant neoplasm of breast: Secondary | ICD-10-CM

## 2016-12-07 ENCOUNTER — Telehealth: Payer: Self-pay | Admitting: Pharmacist

## 2016-12-07 NOTE — Patient Outreach (Signed)
Called [name] today to discuss her anticipated co-pays. Patient requested that I leave a message with any findings. Her amlodipine, losartan and Januvia will not have a co-pay.  Patient has a follow up LTW appointment with me 12/08/16 and we will discuss further.  [name] K. Dicky Doe, PharmD [facility] [phone]

## 2016-12-08 ENCOUNTER — Other Ambulatory Visit: Payer: Self-pay | Admitting: Pharmacist

## 2016-12-09 NOTE — Patient Outreach (Signed)
[facility]) Care Management  [place]   12/09/2016  Breanna Petty 08/06/1941 [id number]  Subjective: Breanna Petty is a 76 year old female here today for her 3 month Link To Wellness follow up. She was hospitalized last November for a small bowel obstruction and possible acute diverticulitis. She states that she had an eye exam in 2017, however, she has not had a recent dental exam. Her A1c has improved from 6.9% to 6.7%. She continues to lose weight, despite eating 3 meals per day.  Objective:  A1c = 6.7% (08/24/16) TC = 197 mg/dl; TG = 47 mg/dl; HDL = 90 mg/dl; LDL = 98 mg/dl  Encounter Medications: Outpatient Encounter Prescriptions as of 12/08/2016  Medication Sig  . amLODipine (NORVASC) 5 MG tablet Take 1 tablet (5 mg total) by mouth daily.  Marland Kitchen aspirin EC 81 MG tablet Take 1 tablet by mouth daily.  Marland Kitchen BAYER CONTOUR NEXT TEST test strip TEST 2 TIMES A DAY  . brimonidine (ALPHAGAN P) 0.1 % SOLN Place 1 drop into both eyes 2 (two) times daily.   Marland Kitchen latanoprost (XALATAN) 0.005 % ophthalmic solution Place 1 drop into both eyes at bedtime.   Marland Kitchen losartan (COZAAR) 50 MG tablet Take 1 tablet (50 mg total) by mouth daily.  Marland Kitchen NITROSTAT 0.4 MG SL tablet DISSOLVE ONE TABLET UNDER TONGUE AS NEED FOR CHEST PAIN AS DIRECTED  . nortriptyline (PAMELOR) 10 MG capsule Take 1 capsule (10 mg total) by mouth at bedtime.  Marland Kitchen omeprazole (PRILOSEC) 20 MG capsule Take 1 capsule (20 mg total) by mouth 2 (two) times daily.  . pravastatin (PRAVACHOL) 40 MG tablet Take 1 tablet (40 mg total) by mouth daily.  . sitaGLIPtin (JANUVIA) 100 MG tablet Take 1 tablet (100 mg total) by mouth daily.  . [DISCONTINUED] HYDROcodone-acetaminophen (NORCO/VICODIN) 5-325 MG tablet Take 1-2 tablets by mouth every 4 (four) hours as needed for moderate pain.   No facility-administered encounter medications on file as of 12/08/2016.     Functional Status: In your present state of health, do you have any  difficulty performing the following activities: 12/09/2016 12/08/2016  Hearing? Tempie Donning  Vision? N -  Difficulty concentrating or making decisions? Tempie Donning  Walking or climbing stairs? Y -  Dressing or bathing? N -  Doing errands, shopping? N -  Some recent data might be hidden    Fall/Depression Screening: PHQ 2/9 Scores 12/08/2016 04/22/2016 03/11/2016 12/09/2015 11/22/2015 09/11/2015 09/04/2015  PHQ - 2 Score 1 1 1  0 0 1 0  PHQ- 9 Score - - - - - - -   [facility] CM Care Plan Problem One   Flowsheet Row Most Recent Value  Care Plan Problem One  Potential for long term complications as a result of diabetes  Role Documenting the Problem One  Clinical Pharmacist  Care Plan for Problem One  Not Active  [facility] Long Term Goal (31-90 days)  Maintain A1C less than 7%,  evidenced by POCT at MD's office  [facility] Goal Start Date  04/22/16  [facility] Long Term Goal Met Date  08/24/16 [A1c = 6.7%]  Interventions for Problem One Long Term Goal  Continue to take medications as prescribed,  consider exercising one day per week to start inside or outside of hospital    [facility] CM Care Plan Problem Two   Flowsheet Row Most Recent Value  Care Plan Problem Two  Patient is not aware of her post prandial glucsoe levels  Role Documenting the  Problem Two  Clinical Pharmacist  Care Plan for Problem Two  Active  Interventions for Problem Two Long Term Goal   Explained the importance of monitoring the post-prandial blood sugar to see how well medications are working  [facility] Long Term Goal (31-90) days  Continue to check fasting blood sugars,  also check 2 hour post-prandial blood sugars 1-2 times per week,  evidenced by patient's glucose monitor  [facility] Long Term Goal Start Date  09/11/15     Assessment: 1. Diabetes: A1c at goal of less than 7%. 2. Hypertension: BP at goal of less than 130/80 mmHg. 3. Cholesterol: within goal: TC < 200 mg/dl; LDL < 100 mg/dl; TG < 150 mg/dl; HDL > 50 mg/dl 4. Advanced Directive: Breanna Petty has a will that  she has hand written. She is interested in having this formalized. 5. Hospitalization: Small bowel obstruction and possible diverticulitis last November. Patient's appetitive is good; however, she continues to lose weight.   Plan: 1. Diabetes: Breanna Petty will schedule her eye exam. Encouraged Breanna Petty to check post-prandial blood glucose 1-2 times per week, two hours post meal. 2. Advanced Directive: Will call patient and provide resources such as [employer] (supplement policy through [facility]) or Legal Aid. 3. Weight Loss: Encouraged patient to reach out to her primary care provider, Breanna Petty for a follow up appointment to discuss her weight. 4. Breanna Petty will return in 3 months for her Link To Wellness visit with the pharmacist; she does not qualify for the [employer]   Elis Rawlinson K. Dicky Doe, PharmD [facility] [phone]

## 2016-12-23 ENCOUNTER — Ambulatory Visit
Admission: RE | Admit: 2016-12-23 | Discharge: 2016-12-23 | Disposition: A | Discharge status: Home / Self Care | Payer: 59 | Source: Ambulatory Visit | Attending: Family Medicine | Admitting: Family Medicine

## 2016-12-23 ENCOUNTER — Ambulatory Visit: Payer: 59

## 2016-12-23 DIAGNOSIS — Z1231 Encounter for screening mammogram for malignant neoplasm of breast: Secondary | ICD-10-CM | POA: Diagnosis not present

## 2016-12-24 ENCOUNTER — Other Ambulatory Visit: Payer: Self-pay | Admitting: Family Medicine

## 2016-12-29 ENCOUNTER — Ambulatory Visit (INDEPENDENT_AMBULATORY_CARE_PROVIDER_SITE_OTHER): Payer: 59 | Admitting: Family Medicine

## 2016-12-29 VITALS — BP 120/60 | HR 80 | Ht 68.0 in | Wt 145.0 lb

## 2016-12-29 DIAGNOSIS — E785 Hyperlipidemia, unspecified: Secondary | ICD-10-CM

## 2016-12-29 DIAGNOSIS — I2510 Atherosclerotic heart disease of native coronary artery without angina pectoris: Secondary | ICD-10-CM

## 2016-12-29 DIAGNOSIS — K219 Gastro-esophageal reflux disease without esophagitis: Secondary | ICD-10-CM | POA: Diagnosis not present

## 2016-12-29 DIAGNOSIS — IMO0001 Reserved for inherently not codable concepts without codable children: Secondary | ICD-10-CM

## 2016-12-29 DIAGNOSIS — I700 Atherosclerosis of aorta: Secondary | ICD-10-CM | POA: Diagnosis not present

## 2016-12-29 DIAGNOSIS — E1165 Type 2 diabetes mellitus with hyperglycemia: Secondary | ICD-10-CM | POA: Diagnosis not present

## 2016-12-29 DIAGNOSIS — I10 Essential (primary) hypertension: Secondary | ICD-10-CM | POA: Diagnosis not present

## 2016-12-29 MED ORDER — PRAVASTATIN SODIUM 40 MG PO TABS: 40.0000 mg | ORAL_TABLET | Freq: Every day | ORAL | 1 refills | Status: DC

## 2016-12-29 MED ORDER — ASPIRIN EC 81 MG PO TBEC: 81.0000 mg | DELAYED_RELEASE_TABLET | Freq: Every day | ORAL | 3 refills | Status: AC

## 2016-12-29 MED ORDER — OMEPRAZOLE 20 MG PO CPDR: DELAYED_RELEASE_CAPSULE | ORAL | 3 refills | Status: DC

## 2016-12-29 MED ORDER — LOSARTAN POTASSIUM 50 MG PO TABS: 50.0000 mg | ORAL_TABLET | Freq: Every day | ORAL | 1 refills | Status: DC

## 2016-12-29 MED ORDER — SITAGLIPTIN PHOSPHATE 100 MG PO TABS: 100.0000 mg | ORAL_TABLET | Freq: Every day | ORAL | 1 refills | Status: DC

## 2016-12-29 MED ORDER — AMLODIPINE BESYLATE 5 MG PO TABS: 5.0000 mg | ORAL_TABLET | Freq: Every day | ORAL | 1 refills | Status: DC

## 2016-12-29 NOTE — Progress Notes (Signed)
Name: Breanna Petty   MRN: [id number]    DOB: Nov 10, 1941   Date:12/29/2016       Progress Note  Subjective  Chief Complaint  Chief Complaint  Patient presents with  . Diabetes  . Gastroesophageal Reflux  . Hyperlipidemia  . Hypertension    Diabetes  She presents for her follow-up diabetic visit. She has type 2 diabetes mellitus. Her disease course has been stable. Hypoglycemia symptoms include headaches. Pertinent negatives for hypoglycemia include no dizziness, nervousness/anxiousness or sweats. Pertinent negatives for diabetes include no blurred vision, no chest pain, no fatigue, no foot paresthesias, no foot ulcerations, no polydipsia, no polyphagia, no polyuria, no visual change, no weakness and no weight loss. There are no hypoglycemic complications. Symptoms are stable. There are no diabetic complications. Pertinent negatives for diabetic complications include no CVA or retinopathy. Risk factors for coronary artery disease include diabetes mellitus, dyslipidemia and hypertension. Current diabetic treatment includes oral agent (monotherapy). She is compliant with treatment all of the time. She is following a generally healthy diet. She participates in exercise daily. Her home blood glucose trend is fluctuating minimally. Her breakfast blood glucose is taken between 8-9 am. Her breakfast blood glucose range is generally 130-140 mg/dl. An ACE inhibitor/angiotensin II receptor blocker is being taken. Eye exam is not current.  Gastroesophageal Reflux  She reports no abdominal pain, no chest pain, no coughing, no dysphagia, no heartburn, no nausea, no sore throat or no wheezing. The problem has been waxing and waning. The symptoms are aggravated by certain foods. Pertinent negatives include no fatigue, melena or weight loss. She has tried a PPI for the symptoms.  Hyperlipidemia  This is a chronic problem. The problem is controlled. Recent lipid tests were reviewed and are normal. Exacerbating  diseases include diabetes. She has no history of chronic renal disease. Pertinent negatives include no chest pain, focal weakness, myalgias or shortness of breath. Current antihyperlipidemic treatment includes statins. There are no compliance problems.  Risk factors for coronary artery disease include diabetes mellitus, dyslipidemia and hypertension.  Hypertension  This is a chronic problem. The current episode started more than 1 year ago. The problem has been waxing and waning since onset. The problem is controlled. Associated symptoms include headaches. Pertinent negatives include no anxiety, blurred vision, chest pain, malaise/fatigue, neck pain, orthopnea, palpitations, peripheral edema, PND, shortness of breath or sweats. There are no associated agents to hypertension. There are no known risk factors for coronary artery disease. Past treatments include angiotensin blockers. The current treatment provides moderate improvement. There is no history of angina, kidney disease, CAD/MI, CVA, heart failure, left ventricular hypertrophy, renovascular disease or retinopathy. There is no history of chronic renal disease or a hypertension causing med.    No problem-specific Assessment & Plan notes found for this encounter.   Past Medical History:  Diagnosis Date  . Anxiety   . Aortic atherosclerosis ([place])   . Atherosclerotic heart disease   . Depression   . Diabetes mellitus without complication ([place])   . Diverticulosis of colon   . GERD (gastroesophageal reflux disease)   . Hiatal hernia   . History of heart artery stent   . History of shingles   . Hyperlipidemia   . Hypertension   . Sarcoidosis ([place])   . Vitamin D deficiency     Past Surgical History:  Procedure Laterality Date  . ABDOMINAL HYSTERECTOMY  1979  . APPENDECTOMY    . CARDIAC SURGERY    . LAPAROTOMY N/A 10/04/2016  Procedure: EXPLORATORY LAPAROTOMY;  Surgeon: Dia Crawford III, MD;  Location: [facility] ORS;  Service: General;   Laterality: N/A;    Family History  Problem Relation Age of Onset  . Diabetes Daughter     Social History   Social History  . Marital status: Single    Spouse name: N/A  . Number of children: N/A  . Years of education: N/A   Occupational History  . Not on file.   Social History Main Topics  . Smoking status: Former Smoker    Packs/day: 2.00    Years: 10.00    Types: Cigarettes    Quit date: 11/16/1988  . Smokeless tobacco: Never Used  . Alcohol use No  . Drug use: No  . Sexual activity: Not Currently   Other Topics Concern  . Not on file   Social History Narrative  . No narrative on file    Allergies  Allergen Reactions  . Ace Inhibitors     Other reaction(s): Cough  . Codeine Swelling  . Codeine Sulfate     Other reaction(s): Unknown  . Ezetimibe     Other reaction(s): Muscle Pain  . Other     Other reaction(s): Muscle Pain     Review of Systems  Constitutional: Negative for chills, fatigue, fever, malaise/fatigue and weight loss.  HENT: Negative for ear discharge, ear pain and sore throat.   Eyes: Negative for blurred vision.  Respiratory: Negative for cough, sputum production, shortness of breath and wheezing.   Cardiovascular: Negative for chest pain, palpitations, orthopnea, leg swelling and PND.  Gastrointestinal: Negative for abdominal pain, blood in stool, constipation, diarrhea, dysphagia, heartburn, melena and nausea.  Genitourinary: Negative for dysuria, frequency, hematuria and urgency.  Musculoskeletal: Negative for back pain, joint pain, myalgias and neck pain.  Skin: Negative for rash.  Neurological: Positive for headaches. Negative for dizziness, tingling, sensory change, focal weakness and weakness.  Endo/Heme/Allergies: Negative for environmental allergies, polydipsia and polyphagia. Does not bruise/bleed easily.  Psychiatric/Behavioral: Negative for depression and suicidal ideas. The patient is not nervous/anxious and does not have  insomnia.      Objective  Vitals:   12/29/16 1042  BP: 120/60  Pulse: 80  Weight: 145 lb (65.8 kg)  Height: 5\' 8"  (1.727 m)    Physical Exam  Constitutional: She is well-developed, well-nourished, and in no distress. No distress.  HENT:  Head: Normocephalic and atraumatic.  Right Ear: External ear normal.  Left Ear: External ear normal.  Nose: Nose normal.  Mouth/Throat: Oropharynx is clear and moist.  Eyes: Conjunctivae and EOM are normal. Pupils are equal, round, and reactive to light. Right eye exhibits no discharge. Left eye exhibits no discharge.  Neck: Normal range of motion. Neck supple. No JVD present. No thyromegaly present.  Cardiovascular: Normal rate, regular rhythm, normal heart sounds and intact distal pulses.  Exam reveals no gallop and no friction rub.   No murmur heard. Pulmonary/Chest: Effort normal and breath sounds normal. She has no wheezes. She has no rales.  Abdominal: Soft. Bowel sounds are normal. She exhibits no mass. There is no tenderness. There is no guarding.  Musculoskeletal: Normal range of motion. She exhibits no edema.  Lymphadenopathy:    She has no cervical adenopathy.  Neurological: She is alert. She has normal reflexes.  Skin: Skin is warm and dry. She is not diaphoretic.  Psychiatric: Mood and affect normal.  Nursing note and vitals reviewed.     Assessment & Plan  Problem List Items Addressed This  Visit      Cardiovascular and Mediastinum   Arteriosclerosis of coronary artery   Relevant Medications   pravastatin (PRAVACHOL) 40 MG tablet   losartan (COZAAR) 50 MG tablet   amLODipine (NORVASC) 5 MG tablet   aspirin EC 81 MG tablet   Hypertension, benign   Relevant Medications   pravastatin (PRAVACHOL) 40 MG tablet   losartan (COZAAR) 50 MG tablet   amLODipine (NORVASC) 5 MG tablet   aspirin EC 81 MG tablet   Other Relevant Orders   Renal Function Panel   Aortic atherosclerosis (HCC)   Relevant Medications   pravastatin  (PRAVACHOL) 40 MG tablet   losartan (COZAAR) 50 MG tablet   amLODipine (NORVASC) 5 MG tablet   aspirin EC 81 MG tablet     Digestive   Gastro-esophageal reflux disease without esophagitis   Relevant Medications   omeprazole (PRILOSEC) 20 MG capsule     Endocrine   Type 2 diabetes mellitus, uncontrolled (HCC) - Primary   Relevant Medications   pravastatin (PRAVACHOL) 40 MG tablet   losartan (COZAAR) 50 MG tablet   sitaGLIPtin (JANUVIA) 100 MG tablet   aspirin EC 81 MG tablet   Other Relevant Orders   Hemoglobin A1c   Microalbumin / creatinine urine ratio   Renal Function Panel     Other   Dyslipidemia   Relevant Medications   pravastatin (PRAVACHOL) 40 MG tablet   Other Relevant Orders   Lipid panel        Dr. Chaston Bradburn [facility]  12/29/16

## 2016-12-30 LAB — LIPID PANEL
Chol/HDL Ratio: 2.7 ratio units (ref 0.0–4.4)
Cholesterol, Total: 219 mg/dL — ABNORMAL HIGH (ref 100–199)
HDL: 80 mg/dL (ref >39)
LDL CALC: 122 mg/dL — AB (ref 0–99)
Triglycerides: 86 mg/dL (ref 0–149)
VLDL CHOLESTEROL CAL: 17 mg/dL (ref 5–40)

## 2016-12-30 LAB — RENAL FUNCTION PANEL
ALBUMIN: 4.4 g/dL (ref 3.5–4.8)
BUN/Creatinine Ratio: 19 (ref 12–28)
BUN: 16 mg/dL (ref 8–27)
CALCIUM: 9.8 mg/dL (ref 8.7–10.3)
CHLORIDE: 99 mmol/L (ref 96–106)
CO2: 25 mmol/L (ref 18–29)
CREATININE: 0.85 mg/dL (ref 0.57–1.00)
GFR calc non Af Amer: 67 mL/min/{1.73_m2} (ref >59)
GFR, EST AFRICAN AMERICAN: 78 mL/min/{1.73_m2} (ref >59)
Glucose: 99 mg/dL (ref 65–99)
Phosphorus: 3.3 mg/dL (ref 2.5–4.5)
Potassium: 4.3 mmol/L (ref 3.5–5.2)
Sodium: 141 mmol/L (ref 134–144)

## 2016-12-30 LAB — HEMOGLOBIN A1C
Est. average glucose Bld gHb Est-mCnc: 154 mg/dL
HEMOGLOBIN A1C: 7 % — AB (ref 4.8–5.6)

## 2016-12-30 LAB — MICROALBUMIN / CREATININE URINE RATIO
CREATININE, UR: 73.5 mg/dL
Microalb/Creat Ratio: <4.1 mg/g creat (ref 0.0–30.0)
Microalbumin, Urine: <3 ug/mL

## 2016-12-31 ENCOUNTER — Other Ambulatory Visit: Payer: Self-pay

## 2017-01-01 DIAGNOSIS — H401132 Primary open-angle glaucoma, bilateral, moderate stage: Secondary | ICD-10-CM | POA: Diagnosis not present

## 2017-01-06 DIAGNOSIS — H401132 Primary open-angle glaucoma, bilateral, moderate stage: Secondary | ICD-10-CM | POA: Diagnosis not present

## 2017-01-27 DIAGNOSIS — H2511 Age-related nuclear cataract, right eye: Secondary | ICD-10-CM | POA: Diagnosis not present

## 2017-02-12 ENCOUNTER — Other Ambulatory Visit: Payer: Self-pay | Admitting: Family Medicine

## 2017-02-15 ENCOUNTER — Other Ambulatory Visit: Payer: Self-pay

## 2017-02-17 ENCOUNTER — Ambulatory Visit: Payer: Self-pay | Admitting: Pharmacist

## 2017-02-17 DIAGNOSIS — M955 Acquired deformity of pelvis: Secondary | ICD-10-CM | POA: Diagnosis not present

## 2017-02-17 DIAGNOSIS — M9903 Segmental and somatic dysfunction of lumbar region: Secondary | ICD-10-CM | POA: Diagnosis not present

## 2017-02-17 DIAGNOSIS — M5441 Lumbago with sciatica, right side: Secondary | ICD-10-CM | POA: Diagnosis not present

## 2017-02-17 DIAGNOSIS — M9905 Segmental and somatic dysfunction of pelvic region: Secondary | ICD-10-CM | POA: Diagnosis not present

## 2017-02-17 DIAGNOSIS — M546 Pain in thoracic spine: Secondary | ICD-10-CM | POA: Diagnosis not present

## 2017-02-17 DIAGNOSIS — M9902 Segmental and somatic dysfunction of thoracic region: Secondary | ICD-10-CM | POA: Diagnosis not present

## 2017-02-24 ENCOUNTER — Other Ambulatory Visit: Payer: Self-pay | Admitting: Pharmacist

## 2017-02-25 NOTE — Patient Outreach (Signed)
Called Ms. Breanna Petty today to see what her April schedule would be. We need to schedule a 3 month LTW follow up visit. The number listed in [facility] is no longer in service. I will reach out to the LTW RN to see if she has another way to contact the patient.  Phoenix Dresser K. Dicky Doe, PharmD [facility] [phone]

## 2017-03-22 DIAGNOSIS — B351 Tinea unguium: Secondary | ICD-10-CM | POA: Diagnosis not present

## 2017-03-22 DIAGNOSIS — M79671 Pain in right foot: Secondary | ICD-10-CM | POA: Diagnosis not present

## 2017-03-22 DIAGNOSIS — M79672 Pain in left foot: Secondary | ICD-10-CM | POA: Diagnosis not present

## 2017-03-22 DIAGNOSIS — E119 Type 2 diabetes mellitus without complications: Secondary | ICD-10-CM | POA: Diagnosis not present

## 2017-04-19 DIAGNOSIS — H2511 Age-related nuclear cataract, right eye: Secondary | ICD-10-CM | POA: Diagnosis not present

## 2017-05-04 ENCOUNTER — Emergency Department: Payer: 59

## 2017-05-04 ENCOUNTER — Other Ambulatory Visit: Payer: Self-pay

## 2017-05-04 ENCOUNTER — Emergency Department
Admission: EM | Admit: 2017-05-04 | Discharge: 2017-05-04 | Disposition: A | Discharge status: Home / Self Care | Payer: 59 | Attending: Emergency Medicine | Admitting: Emergency Medicine

## 2017-05-04 ENCOUNTER — Encounter: Payer: Self-pay | Admitting: *Deleted

## 2017-05-04 DIAGNOSIS — I10 Essential (primary) hypertension: Secondary | ICD-10-CM | POA: Insufficient documentation

## 2017-05-04 DIAGNOSIS — J449 Chronic obstructive pulmonary disease, unspecified: Secondary | ICD-10-CM | POA: Insufficient documentation

## 2017-05-04 DIAGNOSIS — Z7982 Long term (current) use of aspirin: Secondary | ICD-10-CM | POA: Diagnosis not present

## 2017-05-04 DIAGNOSIS — Z955 Presence of coronary angioplasty implant and graft: Secondary | ICD-10-CM | POA: Insufficient documentation

## 2017-05-04 DIAGNOSIS — M79601 Pain in right arm: Secondary | ICD-10-CM | POA: Diagnosis not present

## 2017-05-04 DIAGNOSIS — Z79899 Other long term (current) drug therapy: Secondary | ICD-10-CM | POA: Insufficient documentation

## 2017-05-04 DIAGNOSIS — R002 Palpitations: Secondary | ICD-10-CM | POA: Insufficient documentation

## 2017-05-04 DIAGNOSIS — Z87891 Personal history of nicotine dependence: Secondary | ICD-10-CM | POA: Insufficient documentation

## 2017-05-04 DIAGNOSIS — E119 Type 2 diabetes mellitus without complications: Secondary | ICD-10-CM | POA: Diagnosis not present

## 2017-05-04 DIAGNOSIS — I2510 Atherosclerotic heart disease of native coronary artery without angina pectoris: Secondary | ICD-10-CM | POA: Insufficient documentation

## 2017-05-04 LAB — CBC
HCT: 34.4 % — ABNORMAL LOW (ref 35.0–47.0)
HEMOGLOBIN: 11.4 g/dL — AB (ref 12.0–16.0)
MCH: 30.4 pg (ref 26.0–34.0)
MCHC: 33.3 g/dL (ref 32.0–36.0)
MCV: 91.5 fL (ref 80.0–100.0)
PLATELETS: 185 10*3/uL (ref 150–440)
RBC: 3.76 MIL/uL — AB (ref 3.80–5.20)
RDW: 14.6 % — ABNORMAL HIGH (ref 11.5–14.5)
WBC: 7.6 10*3/uL (ref 3.6–11.0)

## 2017-05-04 LAB — BASIC METABOLIC PANEL
ANION GAP: 6 (ref 5–15)
BUN: 19 mg/dL (ref 6–20)
CALCIUM: 9.5 mg/dL (ref 8.9–10.3)
CO2: 29 mmol/L (ref 22–32)
CREATININE: 1.12 mg/dL — AB (ref 0.44–1.00)
Chloride: 104 mmol/L (ref 101–111)
GFR, EST AFRICAN AMERICAN: 54 mL/min — AB (ref >60)
GFR, EST NON AFRICAN AMERICAN: 47 mL/min — AB (ref >60)
Glucose, Bld: 159 mg/dL — ABNORMAL HIGH (ref 65–99)
Potassium: 4.2 mmol/L (ref 3.5–5.1)
SODIUM: 139 mmol/L (ref 135–145)

## 2017-05-04 LAB — TROPONIN I

## 2017-05-04 NOTE — ED Provider Notes (Signed)
[facility] Emergency Department Provider Note  ____________________________________________   First MD Initiated Contact with Patient 05/04/17 1833     (approximate)  I have reviewed the triage vital signs and the nursing notes.   HISTORY  Chief Complaint Arm Pain   HPI Breanna Petty is a 76 y.o. female with a history of diabetes and coronary artery disease status post stenting 2 years ago on aspirin who is presenting to the emergency department with right arm pain.  She said the pain started at 4 PM this afternoon and lasted an hour. She says the pain was aching and mild and resolved spontaneously. She denies any shortness of breath, nausea vomiting or diaphoresis. She says that she felt to palpitations during this episode and has been a symptomatic ever since. She says that several years ago she had a similar pain in her left arm which resulted in her getting a stent.     Past Medical History:  Diagnosis Date  . Anxiety   . Aortic atherosclerosis ([place])   . Atherosclerotic heart disease   . Depression   . Diabetes mellitus without complication ([place])   . Diverticulosis of colon   . GERD (gastroesophageal reflux disease)   . Hiatal hernia   . History of heart artery stent   . History of shingles   . Hyperlipidemia   . Hypertension   . Sarcoidosis   . Vitamin D deficiency     Patient Active Problem List   Diagnosis Date Noted  . Status post exploratory laparotomy 10/21/2016  . Myocardial infarction ([place]) 10/20/2016  . SBO (small bowel obstruction) ([place])   . Aortic atherosclerosis ([place]) 10/02/2016  . Diverticulosis of large intestine without hemorrhage 10/02/2016  . Acute diverticulitis 10/02/2016  . Hammer toe of right foot 10/16/2015  . Nail deformity 10/16/2015  . Chronic obstructive pulmonary disease ([place]) 09/04/2015  . History of MI (myocardial infarction) 09/04/2015  . Spondylolisthesis at L4-L5 level 07/11/2015  . DDD (degenerative  disc disease), lumbar 07/11/2015  . History of sarcoidosis 06/04/2015  . Urge incontinence of urine 06/04/2015  . Hearing loss of both ears 05/07/2015  . Anxiety and depression 05/04/2015  . Arteriosclerosis of coronary artery 05/04/2015  . Type 2 diabetes mellitus, uncontrolled ([place]) 05/04/2015  . Dyslipidemia 05/04/2015  . Hypertension, benign 05/04/2015  . Gastro-esophageal reflux disease without esophagitis 05/04/2015  . Hiatal hernia 05/04/2015  . HZV (herpes zoster virus) post herpetic neuralgia 05/04/2015  . Vitamin D deficiency 05/04/2015  . S/P coronary artery stent placement 05/04/2015    Past Surgical History:  Procedure Laterality Date  . ABDOMINAL HYSTERECTOMY  1979  . APPENDECTOMY    . CARDIAC SURGERY    . LAPAROTOMY N/A 10/04/2016   Procedure: EXPLORATORY LAPAROTOMY;  Surgeon: Dia Crawford III, MD;  Location: [facility] ORS;  Service: General;  Laterality: N/A;    Prior to Admission medications   Medication Sig Start Date End Date Taking? Authorizing Provider  amLODipine (NORVASC) 5 MG tablet Take 1 tablet (5 mg total) by mouth daily. 12/29/16   Juline Patch, MD  aspirin EC 81 MG tablet Take 1 tablet (81 mg total) by mouth daily. 12/29/16   Juline Patch, MD  BAYER CONTOUR NEXT TEST test strip TEST 2 TIMES A DAY 12/26/15   Ancil Boozer, Drue Stager, MD  brimonidine (ALPHAGAN P) 0.1 % SOLN Place 1 drop into both eyes 2 (two) times daily.     [provider]  latanoprost (XALATAN) 0.005 % ophthalmic solution Place  1 drop into both eyes at bedtime.     [provider]  losartan (COZAAR) 50 MG tablet Take 1 tablet (50 mg total) by mouth daily. 12/29/16   Juline Patch, MD  NITROSTAT 0.4 MG SL tablet DISSOLVE ONE TABLET UNDER TONGUE AS NEED FOR CHEST PAIN AS DIRECTED 11/28/15   Steele Sizer, MD  nortriptyline (PAMELOR) 10 MG capsule Take 1 capsule (10 mg total) by mouth at bedtime. Patient taking differently: Take 10 mg by mouth at bedtime. Dr Vanita Ingles 08/24/16   Juline Patch, MD  omeprazole (PRILOSEC) 20 MG capsule TAKE 1 CAPSULE BY MOUTH 2 TIMES DAILY. 12/29/16   Juline Patch, MD  pravastatin (PRAVACHOL) 40 MG tablet Take 1 tablet (40 mg total) by mouth daily. 12/29/16   Juline Patch, MD  sitaGLIPtin (JANUVIA) 100 MG tablet Take 1 tablet (100 mg total) by mouth daily. 12/29/16   Juline Patch, MD    Allergies Ace inhibitors; Codeine; Codeine sulfate; Ezetimibe; and Other  Family History  Problem Relation Age of Onset  . Diabetes Daughter     Social History Social History  Substance Use Topics  . Smoking status: Former Smoker    Packs/day: 2.00    Years: 10.00    Types: Cigarettes    Quit date: 11/16/1988  . Smokeless tobacco: Never Used  . Alcohol use No    Review of Systems  Constitutional: No fever/chills Eyes: No visual changes. ENT: No sore throat. Cardiovascular: Denies chest pain. Respiratory: Denies shortness of breath. Gastrointestinal: No abdominal pain.  No nausea, no vomiting.  No diarrhea.  No constipation. Genitourinary: Negative for dysuria. Musculoskeletal: Negative for back pain. Skin: Negative for rash. Neurological: Negative for headaches, focal weakness or numbness.   ____________________________________________   PHYSICAL EXAM:  VITAL SIGNS: ED Triage Vitals  Enc Vitals Group     BP 05/04/17 1806 (!) 152/61     Pulse Rate 05/04/17 1801 84     Resp 05/04/17 1801 20     Temp 05/04/17 1801 98.2 F (36.8 C)     Temp Source 05/04/17 1801 Oral     SpO2 05/04/17 1801 100 %     Weight 05/04/17 1802 150 lb (68 kg)     Height 05/04/17 1802 5\' 8"  (1.727 m)     Head Circumference --      Peak Flow --      Pain Score 05/04/17 1800 6     Pain Loc --      Pain Edu? --      Excl. in [place]? --     Constitutional: Alert and oriented. Well appearing and in no acute distress. Eyes: Conjunctivae are normal.  Head: Atraumatic. Nose: No congestion/rhinnorhea. Mouth/Throat: Mucous membranes are moist.  Neck: No  stridor.   Cardiovascular: Normal rate, regular rhythm. Grossly normal heart sounds.  Pulses palpable bilateral radial pulses which are equal. Respiratory: Normal respiratory effort.  No retractions. Lungs CTAB. Gastrointestinal: Soft and nontender. No distention. No CVA tenderness. Musculoskeletal: No lower extremity tenderness nor edema.  No joint effusions. No tenderness to the bilateral arms. No swelling. Neurologic:  Normal speech and language. No gross focal neurologic deficits are appreciated. Skin:  Skin is warm, dry and intact. No rash noted. Psychiatric: Mood and affect are normal. Speech and behavior are normal.  ____________________________________________   LABS (all labs ordered are listed, but only abnormal results are displayed)  Labs Reviewed  BASIC METABOLIC PANEL - Abnormal; Notable for the following:  Result Value   Glucose, Bld 159 (*)    Creatinine, Ser 1.12 (*)    GFR calc non Af Amer 47 (*)    GFR calc Af Amer 54 (*)    All other components within normal limits  CBC - Abnormal; Notable for the following:    RBC 3.76 (*)    Hemoglobin 11.4 (*)    HCT 34.4 (*)    RDW 14.6 (*)    All other components within normal limits  TROPONIN I   ____________________________________________  EKG  ED ECG REPORT I, Doran Stabler, the attending physician, personally viewed and interpreted this ECG.   Date: 05/04/2017  EKG Time: 1809  Rate: 72  Rhythm: normal sinus rhythm. PVCs present  Axis: Normal  Intervals:none  ST&T Change: No ST segment elevation or depression. No abnormal T-wave inversion.  ____________________________________________  RADIOLOGY  No acute pathology seen on the chest x-ray ____________________________________________   PROCEDURES  Procedure(s) performed:   Procedures  Critical Care performed:   ____________________________________________   INITIAL IMPRESSION / ASSESSMENT AND PLAN / ED COURSE  Pertinent labs &  imaging results that were available during my care of the patient were reviewed by me and considered in my medical decision making (see chart for details).  ----------------------------------------- 8:02 PM on 05/04/2017 -----------------------------------------  Patient symptom-free at this time. EKG repeated because the first EKG was read as A. fib but there is a poor baseline. Second EKG very reassuring without any significant changes from the previous on the x-rays. I wanted to repeat a second troponin but the patient says she would likely at this time. She looks well and says that she will call her cardiologist, Dr. Nehemiah Massed first in the morning. Although this is not ideal given the patient's history she is insistent and understands the risks of leaving prior to the completion of her cardiac workup. These risks may include death, disability or worsening of her condition.      ____________________________________________   FINAL CLINICAL IMPRESSION(S) / ED DIAGNOSES  Palpitations. Arm pain.    NEW MEDICATIONS STARTED DURING THIS VISIT:  New Prescriptions   No medications on file     Note:  This document was prepared using Dragon voice recognition software and may include unintentional dictation errors.     Orbie Pyo, MD 05/04/17 2003

## 2017-05-04 NOTE — ED Triage Notes (Signed)
Pt reports increased weakness since 1630, right arm pain. Pt's PCP Dr Ronnald Ramp and Dr Nehemiah Massed.

## 2017-05-04 NOTE — ED Triage Notes (Signed)
Pt reports pain in right arm and quivering in chest today while at work at 1630.  Pt denies chest pain or sob.  No n/v/.  No diaphoresis.  Pt alert.  Speech clear.

## 2017-05-04 NOTE — ED Notes (Signed)
Pt presents with right upper arm pain and nervous, shaky feeling this afternoon. She states that she sometimes feels like this when her sugar drops. She states that her arm doesn't hurt any more, and she is not shaky, only cold. Pt is diabetic and takes metformin. Alert & oriented with NAD noted.

## 2017-06-03 ENCOUNTER — Other Ambulatory Visit: Payer: Self-pay | Admitting: Pharmacist

## 2017-06-03 NOTE — Patient Outreach (Signed)
Spoke with Breanna Petty today. Her work schedule has changed and we had not been able to connect with each other to schedule the 3 month follow up appointment. She was in the ED about a month ago for right arm pain and palpitations per [facility]. States she is doing well today. Last A1c = 7.0% 12/30/16.  She is scheduled for the 6 month follow up LTW appointment on 06/16/17 at 3:15pm.    [name] K. Dicky Doe, PharmD [facility] [phone]

## 2017-06-08 ENCOUNTER — Other Ambulatory Visit: Payer: Self-pay | Admitting: Family Medicine

## 2017-06-08 NOTE — Telephone Encounter (Signed)
Patient requesting refill of Nitrostat to [facility].

## 2017-06-09 ENCOUNTER — Other Ambulatory Visit: Payer: Self-pay | Admitting: Family Medicine

## 2017-06-10 DIAGNOSIS — E782 Mixed hyperlipidemia: Secondary | ICD-10-CM | POA: Diagnosis not present

## 2017-06-10 DIAGNOSIS — I214 Non-ST elevation (NSTEMI) myocardial infarction: Secondary | ICD-10-CM | POA: Diagnosis not present

## 2017-06-10 DIAGNOSIS — E118 Type 2 diabetes mellitus with unspecified complications: Secondary | ICD-10-CM | POA: Diagnosis not present

## 2017-06-10 DIAGNOSIS — I2510 Atherosclerotic heart disease of native coronary artery without angina pectoris: Secondary | ICD-10-CM | POA: Diagnosis not present

## 2017-06-10 DIAGNOSIS — I10 Essential (primary) hypertension: Secondary | ICD-10-CM | POA: Diagnosis not present

## 2017-06-15 ENCOUNTER — Ambulatory Visit: Payer: 59 | Admitting: Family Medicine

## 2017-06-16 ENCOUNTER — Ambulatory Visit: Payer: Self-pay | Admitting: Pharmacist

## 2017-06-18 ENCOUNTER — Encounter: Payer: Self-pay | Admitting: Family Medicine

## 2017-06-18 ENCOUNTER — Ambulatory Visit (INDEPENDENT_AMBULATORY_CARE_PROVIDER_SITE_OTHER): Payer: 59 | Admitting: Family Medicine

## 2017-06-18 VITALS — BP 106/64 | HR 83 | Ht 68.0 in | Wt 152.0 lb

## 2017-06-18 DIAGNOSIS — Z862 Personal history of diseases of the blood and blood-forming organs and certain disorders involving the immune mechanism: Secondary | ICD-10-CM | POA: Diagnosis not present

## 2017-06-18 DIAGNOSIS — Z87898 Personal history of other specified conditions: Secondary | ICD-10-CM | POA: Diagnosis not present

## 2017-06-18 DIAGNOSIS — IMO0001 Reserved for inherently not codable concepts without codable children: Secondary | ICD-10-CM

## 2017-06-18 DIAGNOSIS — I2510 Atherosclerotic heart disease of native coronary artery without angina pectoris: Secondary | ICD-10-CM

## 2017-06-18 DIAGNOSIS — I700 Atherosclerosis of aorta: Secondary | ICD-10-CM

## 2017-06-18 DIAGNOSIS — E1165 Type 2 diabetes mellitus with hyperglycemia: Secondary | ICD-10-CM | POA: Diagnosis not present

## 2017-06-18 LAB — POCT URINALYSIS DIPSTICK
BILIRUBIN UA: NEGATIVE
GLUCOSE UA: NEGATIVE
Ketones, UA: NEGATIVE
LEUKOCYTES UA: NEGATIVE
NITRITE UA: NEGATIVE
Protein, UA: NEGATIVE
RBC UA: NEGATIVE
Spec Grav, UA: 1.01 (ref 1.010–1.025)
UROBILINOGEN UA: 0.2 U/dL
pH, UA: 6 (ref 5.0–8.0)

## 2017-06-18 LAB — HEMOCCULT GUIAC POC 1CARD (OFFICE): FECAL OCCULT BLD: NEGATIVE

## 2017-06-18 NOTE — Progress Notes (Signed)
Name: Breanna Petty   MRN: [id number]    DOB: [birth year]   Date:06/18/2017       Progress Note  Subjective  Chief Complaint  Chief Complaint  Patient presents with  . Weight Loss    Patient would like to discuss weight loss. She believes she has lost weight in the last few years. Seen cardiologist. Nurse told her she has lost 10 pounds since seeing them. Eats more than usual.       Patient presents for weight loss concern.  Weight appears to be stable over the past 18 months.   Diabetes  She presents for her follow-up diabetic visit. She has type 2 diabetes mellitus. Her disease course has been stable. There are no hypoglycemic associated symptoms. Pertinent negatives for hypoglycemia include no dizziness, headaches or nervousness/anxiousness. Pertinent negatives for diabetes include no blurred vision, no chest pain, no fatigue, no foot paresthesias, no foot ulcerations, no polydipsia, no polyphagia, no polyuria, no visual change, no weakness and no weight loss. There are no hypoglycemic complications. Symptoms are stable. Risk factors for coronary artery disease include dyslipidemia, diabetes mellitus and hypertension. Current diabetic treatment includes oral agent (monotherapy).    No problem-specific Assessment & Plan notes found for this encounter.   Past Medical History:  Diagnosis Date  . Anxiety   . Aortic atherosclerosis ([place])   . Atherosclerotic heart disease   . Depression   . Diabetes mellitus without complication ([place])   . Diverticulosis of colon   . GERD (gastroesophageal reflux disease)   . Hiatal hernia   . History of heart artery stent   . History of shingles   . Hyperlipidemia   . Hypertension   . Sarcoidosis   . Vitamin D deficiency     Past Surgical History:  Procedure Laterality Date  . ABDOMINAL HYSTERECTOMY  1979  . APPENDECTOMY    . CARDIAC SURGERY    . LAPAROTOMY N/A 10/04/2016   Procedure: EXPLORATORY LAPAROTOMY;  Surgeon: Dia Crawford III, MD;   Location: [facility] ORS;  Service: General;  Laterality: N/A;    Family History  Problem Relation Age of Onset  . Diabetes Daughter     Social History   Social History  . Marital status: Single    Spouse name: N/A  . Number of children: N/A  . Years of education: N/A   Occupational History  . Not on file.   Social History Main Topics  . Smoking status: Former Smoker    Packs/day: 2.00    Years: 10.00    Types: Cigarettes    Quit date: 11/16/1988  . Smokeless tobacco: Never Used  . Alcohol use No  . Drug use: No  . Sexual activity: Not Currently   Other Topics Concern  . Not on file   Social History Narrative  . No narrative on file    Allergies  Allergen Reactions  . Ace Inhibitors     Other reaction(s): Cough  . Codeine Swelling  . Codeine Sulfate     Other reaction(s): Unknown  . Ezetimibe     Other reaction(s): Muscle Pain  . Other     Other reaction(s): Muscle Pain    Outpatient Medications Prior to Visit  Medication Sig Dispense Refill  . aspirin EC 81 MG tablet Take 1 tablet (81 mg total) by mouth daily. 90 tablet 3  . BAYER CONTOUR NEXT TEST test strip TEST 2 TIMES A DAY 200 each 3  . brimonidine (ALPHAGAN P) 0.1 % SOLN Place 1  drop into both eyes 2 (two) times daily.     Marland Kitchen latanoprost (XALATAN) 0.005 % ophthalmic solution Place 1 drop into both eyes at bedtime.     Marland Kitchen losartan (COZAAR) 50 MG tablet Take 1 tablet (50 mg total) by mouth daily. 90 tablet 1  . NITROSTAT 0.4 MG SL tablet DISSOLVE ONE TABLET UNDER TONGUE AS NEED FOR CHEST PAIN AS DIRECTED 30 tablet 0  . nortriptyline (PAMELOR) 10 MG capsule Take 1 capsule (10 mg total) by mouth at bedtime. (Patient taking differently: Take 10 mg by mouth at bedtime. Dr Vanita Ingles) 90 capsule 1  . omeprazole (PRILOSEC) 20 MG capsule TAKE 1 CAPSULE BY MOUTH 2 TIMES DAILY. 180 capsule 3  . pravastatin (PRAVACHOL) 40 MG tablet Take 1 tablet (40 mg total) by mouth daily. 90 tablet 1  . sitaGLIPtin (JANUVIA) 100 MG tablet  Take 1 tablet (100 mg total) by mouth daily. 90 tablet 1  . amLODipine (NORVASC) 5 MG tablet Take 1 tablet (5 mg total) by mouth daily. 90 tablet 1   No facility-administered medications prior to visit.     Review of Systems  Constitutional: Negative for chills, fatigue, fever, malaise/fatigue and weight loss.  HENT: Negative for ear discharge, ear pain and sore throat.   Eyes: Negative for blurred vision.  Respiratory: Negative for cough, sputum production, shortness of breath and wheezing.   Cardiovascular: Negative for chest pain, palpitations and leg swelling.  Gastrointestinal: Negative for abdominal pain, blood in stool, constipation, diarrhea, heartburn, melena and nausea.  Genitourinary: Negative for dysuria, frequency, hematuria and urgency.  Musculoskeletal: Negative for back pain, joint pain, myalgias and neck pain.  Skin: Negative for rash.  Neurological: Negative for dizziness, tingling, sensory change, focal weakness, weakness and headaches.  Endo/Heme/Allergies: Negative for environmental allergies, polydipsia and polyphagia. Does not bruise/bleed easily.  Psychiatric/Behavioral: Negative for depression and suicidal ideas. The patient is not nervous/anxious and does not have insomnia.      Objective  Vitals:   06/18/17 0850  BP: 106/64  Pulse: 83  SpO2: 100%  Weight: 152 lb (68.9 kg)  Height: 5\' 8"  (1.727 m)    Physical Exam  Constitutional: She is well-developed, well-nourished, and in no distress. No distress.  HENT:  Head: Normocephalic and atraumatic.  Right Ear: Tympanic membrane and external ear normal.  Left Ear: Tympanic membrane and external ear normal.  Nose: Nose normal.  Mouth/Throat: Uvula is midline, oropharynx is clear and moist and mucous membranes are normal. No oropharyngeal exudate, posterior oropharyngeal edema or posterior oropharyngeal erythema.  Eyes: Pupils are equal, round, and reactive to light. Conjunctivae and EOM are normal. Right  eye exhibits no discharge. Left eye exhibits no discharge.  Neck: Normal range of motion. Neck supple. Normal carotid pulses, no hepatojugular reflux and no JVD present. Carotid bruit is not present. No thyromegaly present.  Cardiovascular: Normal rate, regular rhythm, S1 normal, S2 normal, normal heart sounds and intact distal pulses.  PMI is not displaced.  Exam reveals no gallop, no S3, no S4 and no friction rub.   No murmur heard. Pulmonary/Chest: Effort normal and breath sounds normal. She has no wheezes. She has no rales.  Abdominal: Soft. Bowel sounds are normal. She exhibits no mass. There is no tenderness. There is no guarding.  Musculoskeletal: Normal range of motion. She exhibits no edema.  Lymphadenopathy:       Head (right side): No submental and no submandibular adenopathy present.       Head (left side): No submental  and no submandibular adenopathy present.    She has no cervical adenopathy.  Neurological: She is alert. She has normal reflexes.  Skin: Skin is warm and dry. She is not diaphoretic.  Psychiatric: Mood and affect normal.  Nursing note and vitals reviewed.     Assessment & Plan  Problem List Items Addressed This Visit      Cardiovascular and Mediastinum   Aortic atherosclerosis ([place])     Endocrine   Type 2 diabetes mellitus, uncontrolled ([place])   Relevant Orders   Hemoglobin A1c   POCT occult blood stool (Completed)   POCT urinalysis dipstick (Completed)     Other   History of sarcoidosis    Other Visit Diagnoses    History of weight loss    -  Primary   Relevant Orders   CBC with Differential/Platelet   Renal Function Panel   Hemoglobin A1c   Hepatic Function Panel (6)   POCT occult blood stool (Completed)   POCT urinalysis dipstick (Completed)      No orders of the defined types were placed in this encounter.     Dr. Macon [facility]  06/18/17

## 2017-06-21 DIAGNOSIS — Z87898 Personal history of other specified conditions: Secondary | ICD-10-CM | POA: Diagnosis not present

## 2017-06-21 DIAGNOSIS — E1165 Type 2 diabetes mellitus with hyperglycemia: Secondary | ICD-10-CM | POA: Diagnosis not present

## 2017-06-22 LAB — HEPATIC FUNCTION PANEL (6)
ALT: 16 IU/L (ref 0–32)
AST: 21 IU/L (ref 0–40)
Alkaline Phosphatase: 71 IU/L (ref 39–117)
Bilirubin Total: 0.4 mg/dL (ref 0.0–1.2)
Bilirubin, Direct: 0.14 mg/dL (ref 0.00–0.40)

## 2017-06-22 LAB — HEMOGLOBIN A1C
ESTIMATED AVERAGE GLUCOSE: 137 mg/dL
HEMOGLOBIN A1C: 6.4 % — AB (ref 4.8–5.6)

## 2017-06-22 LAB — CBC WITH DIFFERENTIAL/PLATELET
BASOS ABS: 0 10*3/uL (ref 0.0–0.2)
Basos: 1 %
EOS (ABSOLUTE): 0.1 10*3/uL (ref 0.0–0.4)
Eos: 2 %
Hematocrit: 35.9 % (ref 34.0–46.6)
Hemoglobin: 11.7 g/dL (ref 11.1–15.9)
IMMATURE GRANS (ABS): 0 10*3/uL (ref 0.0–0.1)
IMMATURE GRANULOCYTES: 0 %
LYMPHS: 43 %
Lymphocytes Absolute: 2.7 10*3/uL (ref 0.7–3.1)
MCH: 29.5 pg (ref 26.6–33.0)
MCHC: 32.6 g/dL (ref 31.5–35.7)
MCV: 91 fL (ref 79–97)
MONOCYTES: 5 %
MONOS ABS: 0.3 10*3/uL (ref 0.1–0.9)
NEUTROS PCT: 49 %
Neutrophils Absolute: 3 10*3/uL (ref 1.4–7.0)
PLATELETS: 258 10*3/uL (ref 150–379)
RBC: 3.96 x10E6/uL (ref 3.77–5.28)
RDW: 14.4 % (ref 12.3–15.4)
WBC: 6.2 10*3/uL (ref 3.4–10.8)

## 2017-06-22 LAB — RENAL FUNCTION PANEL
Albumin: 4.3 g/dL (ref 3.5–4.8)
BUN/Creatinine Ratio: 21 (ref 12–28)
BUN: 19 mg/dL (ref 8–27)
CALCIUM: 9.7 mg/dL (ref 8.7–10.3)
CHLORIDE: 99 mmol/L (ref 96–106)
CO2: 25 mmol/L (ref 20–29)
Creatinine, Ser: 0.91 mg/dL (ref 0.57–1.00)
GFR calc Af Amer: 71 mL/min/{1.73_m2} (ref >59)
GFR calc non Af Amer: 62 mL/min/{1.73_m2} (ref >59)
GLUCOSE: 93 mg/dL (ref 65–99)
PHOSPHORUS: 3.3 mg/dL (ref 2.5–4.5)
POTASSIUM: 4.4 mmol/L (ref 3.5–5.2)
SODIUM: 138 mmol/L (ref 134–144)

## 2017-06-28 ENCOUNTER — Other Ambulatory Visit: Payer: Self-pay

## 2017-06-30 ENCOUNTER — Ambulatory Visit: Payer: Self-pay | Admitting: Pharmacist

## 2017-07-01 ENCOUNTER — Encounter: Payer: Self-pay | Admitting: Physician Assistant

## 2017-07-01 ENCOUNTER — Ambulatory Visit: Payer: Self-pay | Admitting: Physician Assistant

## 2017-07-01 VITALS — BP 130/80 | HR 83 | Temp 98.4°F

## 2017-07-01 DIAGNOSIS — M7551 Bursitis of right shoulder: Secondary | ICD-10-CM

## 2017-07-01 MED ORDER — MELOXICAM 7.5 MG PO TABS: 7.5000 mg | ORAL_TABLET | Freq: Every day | ORAL | 0 refills | Status: DC

## 2017-07-01 NOTE — Progress Notes (Signed)
   Subjective: Right Shoulder Pain    Patient ID: Breanna Petty, female    DOB: 1941/07/21, 76 y.o.   MRN: [id number]  HPI Patient c/o intermitting right shoulder pain for 2 weeks. No provocative incident for compliant. Pain increased with abduction and overhead reaching. Patient is right hand dominate. No palliative measure for compliant.   Review of Systems Arthritis    Objective:   Physical Exam No obvious deformity, edema, erythema, or ecchymosis to right shoulder. Decreased ROM with abduction and overhead reaching. Moderate guarding with palpation of G/H joint and humeral head.       Assessment & Plan:Buristis right shoulder  Supportive care and intermitting usage of Mobic.  Follow up with PCP.

## 2017-07-05 DIAGNOSIS — I214 Non-ST elevation (NSTEMI) myocardial infarction: Secondary | ICD-10-CM | POA: Diagnosis not present

## 2017-08-10 ENCOUNTER — Other Ambulatory Visit: Payer: Self-pay | Admitting: Family Medicine

## 2017-08-10 DIAGNOSIS — I10 Essential (primary) hypertension: Secondary | ICD-10-CM

## 2017-08-11 ENCOUNTER — Encounter: Payer: Self-pay | Admitting: Pharmacist

## 2017-08-11 ENCOUNTER — Other Ambulatory Visit: Payer: Self-pay | Admitting: Pharmacist

## 2017-08-12 NOTE — Patient Outreach (Signed)
[facility]) Care Management  [place]   08/12/2017  DALESHA STANBACK 08-23-1941 [id number]  Subjective: Breanna Petty is a 76 year old female here today for her 6 month Link To Wellness follow up. She was recently seen in the ED for right shoulder pain which has subsided now. She states that she had an eye exam in 2018, however, she has not had a recent dental exam. She has scheduled cataract surgery for 10/30/17 (right eye). Her A1c has improved from 6.7% to 6.4%.   Objective:  Vitals:   08/12/17 1102  BP: 140/70  Height: 1.727 m (5\' 8" )   LABS: A1c = 6.4% (06/21/17) TC = 219 mg/dl; TG = 86 mg/dl; HDL = 80 mg/dl; LDL = 122 mg/dl (12/29/16)  Encounter Medications: Outpatient Encounter Prescriptions as of 08/11/2017  Medication Sig  . amLODipine (NORVASC) 5 MG tablet Take 1 tablet (5 mg total) by mouth daily.  Marland Kitchen aspirin EC 81 MG tablet Take 1 tablet (81 mg total) by mouth daily.  Marland Kitchen BAYER CONTOUR NEXT TEST test strip TEST 2 TIMES A DAY  . latanoprost (XALATAN) 0.005 % ophthalmic solution Place 1 drop into both eyes at bedtime.   Marland Kitchen losartan (COZAAR) 50 MG tablet TAKE 1 TABLET BY MOUTH DAILY.  . meloxicam (MOBIC) 7.5 MG tablet Take 1 tablet (7.5 mg total) by mouth daily.  . Multiple Vitamin (MULTIVITAMIN) capsule Take 1 capsule by mouth daily.  Marland Kitchen NITROSTAT 0.4 MG SL tablet DISSOLVE ONE TABLET UNDER TONGUE AS NEED FOR CHEST PAIN AS DIRECTED  . nortriptyline (PAMELOR) 10 MG capsule Take 1 capsule (10 mg total) by mouth at bedtime. (Patient taking differently: Take 10 mg by mouth at bedtime. Dr Vanita Ingles)  . omeprazole (PRILOSEC) 20 MG capsule TAKE 1 CAPSULE BY MOUTH 2 TIMES DAILY.  . pravastatin (PRAVACHOL) 40 MG tablet Take 1 tablet (40 mg total) by mouth daily. (Patient taking differently: Take 40 mg by mouth every evening. )  . sitaGLIPtin (JANUVIA) 100 MG tablet Take 1 tablet (100 mg total) by mouth daily.  . [DISCONTINUED] brimonidine (ALPHAGAN P) 0.1 % SOLN Place 1  drop into both eyes 2 (two) times daily.    No facility-administered encounter medications on file as of 08/11/2017.     Functional Status: In your present state of health, do you have any difficulty performing the following activities: 08/12/2017 08/11/2017  Hearing? Y N  Comment wears hearing aids -  Vision? - N  Difficulty concentrating or making decisions? - N  Walking or climbing stairs? - N  Dressing or bathing? - N  Doing errands, shopping? - N  Some recent data might be hidden    Fall/Depression Screening: Fall Risk  08/11/2017 06/18/2017 03/11/2016  Falls in the past year? No No No  Number falls in past yr: - - -  Injury with Fall? - - -  Comment - - -  Risk for fall due to : - - -   PHQ 2/9 Scores 08/11/2017 06/18/2017 12/08/2016 04/22/2016 03/11/2016 12/09/2015 11/22/2015  PHQ - 2 Score 0 1 1 1 1  0 0  PHQ- 9 Score - - - - - - -    [facility] CM Care Plan Problem One     Most Recent Value  Care Plan Problem One  Potential for long term complications as a result of diabetes  Role Documenting the Problem One  Clinical Pharmacist  Care Plan for Problem One  Active  [facility] Long Term Goal   Over  the next 90 days, maintain A1C less than 7%,  evidenced by POCT at MD's office  [place] Goal Start Date  08/11/17  Interventions for Problem One Long Term Goal  Continue to take medications as prescribed,  consider exercising one day per week to start inside or outside of hospital    [facility] CM Care Plan Problem Two     Most Recent Value  Care Plan Problem Two  Patient is not aware of her post prandial glucsoe levels  Role Documenting the Problem Two  Clinical Pharmacist  Care Plan for Problem Two  Active  Interventions for Problem Two Long Term Goal   Explained the importance of monitoring the post-prandial blood sugar to see how well medications are working  [facility] Long Term Goal  Continue to check fasting blood sugars,  also check 2 hour post-prandial blood sugars 1-2 times per week,  evidenced by  patient's glucose monitor  [facility] Long Term Goal Start Date  08/11/17       Assessment: 1. Diabetes: A1c at goal of less than 7%. 2. Hypertension: BP at goal of less than 140/90 mmHg. (ADA guidelines) 3. Cholesterol: Within goal: TG < 150 mg/dl; HDL > 50 mg/dl; Not within goal: TC > 200 mg/dl; LDL > 100 mg/dl. Readings listed above are slightly higher than previous values. Patient admitted that she occasionally forgets to take her pravastatin. She was given a pill box at today's visit. 4. Advanced Directive: Ms. Lupien has a will that she has hand written. She is interested in having this formalized. She was given the number to [employer] today. 5. Hospitalization/ED: Small bowel obstruction and possible diverticulitis last November; right arm pain and palpitation 05-04-17; right shoulder pain 07-01-17.  6. Weight Loss: Patient's appetitive is good post bowel obstruction; however, patient's perception is that she is still losing weight.  10/12/16 166 lbs, 12/29/16 145 lbs, 06/18/17 152 lbs. Patient refused to have her weight checked at today's visit.   Plan: 1. Diabetes/Cholesterol: Ms. Blue will schedule her dental exam. Encouraged Ms. B[place] to check post-prandial blood glucose 1-2 times per week, two hours post meal. Encouraged patient to use pill box to help remember to take the pravastatin. 2. Advanced Directive: Patient was given [employer]'s number. 3. Weight Loss: Patient has discussed this with her primary care physician. Weight is trending back up. Refused to check weight at today's visit. 4. Ms. Alcaide will return in 4 months for her Link To Wellness visit with the pharmacist; she does not qualify for the [employer]   Javarius Tsosie K. Dicky Doe, PharmD [facility] [phone]

## 2017-08-16 ENCOUNTER — Encounter: Payer: Self-pay | Admitting: Pharmacist

## 2017-08-25 DIAGNOSIS — B351 Tinea unguium: Secondary | ICD-10-CM | POA: Diagnosis not present

## 2017-08-25 DIAGNOSIS — M79671 Pain in right foot: Secondary | ICD-10-CM | POA: Diagnosis not present

## 2017-08-25 DIAGNOSIS — M79672 Pain in left foot: Secondary | ICD-10-CM | POA: Diagnosis not present

## 2017-08-26 ENCOUNTER — Ambulatory Visit: Payer: 59 | Admitting: Family Medicine

## 2017-08-30 ENCOUNTER — Ambulatory Visit (INDEPENDENT_AMBULATORY_CARE_PROVIDER_SITE_OTHER): Payer: 59 | Admitting: Family Medicine

## 2017-08-30 ENCOUNTER — Encounter: Payer: Self-pay | Admitting: Family Medicine

## 2017-08-30 VITALS — BP 120/62 | HR 60 | Ht 71.0 in | Wt 155.0 lb

## 2017-08-30 DIAGNOSIS — Z23 Encounter for immunization: Secondary | ICD-10-CM

## 2017-08-30 DIAGNOSIS — R4182 Altered mental status, unspecified: Secondary | ICD-10-CM

## 2017-08-30 DIAGNOSIS — Z1239 Encounter for other screening for malignant neoplasm of breast: Secondary | ICD-10-CM

## 2017-08-30 DIAGNOSIS — J069 Acute upper respiratory infection, unspecified: Secondary | ICD-10-CM

## 2017-08-30 DIAGNOSIS — I2510 Atherosclerotic heart disease of native coronary artery without angina pectoris: Secondary | ICD-10-CM

## 2017-08-30 DIAGNOSIS — Z1231 Encounter for screening mammogram for malignant neoplasm of breast: Secondary | ICD-10-CM

## 2017-08-30 DIAGNOSIS — J302 Other seasonal allergic rhinitis: Secondary | ICD-10-CM | POA: Diagnosis not present

## 2017-08-30 MED ORDER — MONTELUKAST SODIUM 10 MG PO TABS: 10.0000 mg | ORAL_TABLET | Freq: Every day | ORAL | 3 refills | Status: DC

## 2017-08-30 NOTE — Progress Notes (Signed)
Name: Breanna Petty   MRN: [id number]    DOB: [birth year]   Date:08/30/2017       Progress Note  Subjective  Chief Complaint  Chief Complaint  Patient presents with  . Sinusitis    stopped up at night, white production    "I've got a lot of cold in me."   Sinusitis  This is a new problem. The current episode started more than 1 month ago (all summer). The problem has been waxing and waning since onset. There has been no fever. She is experiencing no pain. Associated symptoms include congestion, diaphoresis, ear pain, headaches and sinus pressure. Pertinent negatives include no chills, coughing, hoarse voice, neck pain, shortness of breath, sneezing, sore throat or swollen glands. (Left) Past treatments include nothing.    No problem-specific Assessment & Plan notes found for this encounter.   Past Medical History:  Diagnosis Date  . Anxiety   . Aortic atherosclerosis ([place])   . Atherosclerotic heart disease   . Depression   . Diabetes mellitus without complication ([place])   . Diverticulosis of colon   . GERD (gastroesophageal reflux disease)   . Hiatal hernia   . History of heart artery stent   . History of shingles   . Hyperlipidemia   . Hypertension   . Sarcoidosis   . Vitamin D deficiency     Past Surgical History:  Procedure Laterality Date  . ABDOMINAL HYSTERECTOMY  1979  . APPENDECTOMY    . CARDIAC SURGERY    . LAPAROTOMY N/A 10/04/2016   Procedure: EXPLORATORY LAPAROTOMY;  Surgeon: Dia Crawford III, MD;  Location: [facility] ORS;  Service: General;  Laterality: N/A;    Family History  Problem Relation Age of Onset  . Diabetes Daughter   . Hypertension Mother   . Diabetes Son     Social History   Social History  . Marital status: Single    Spouse name: N/A  . Number of children: N/A  . Years of education: N/A   Occupational History  . Not on file.   Social History Main Topics  . Smoking status: Former Smoker    Packs/day: 2.00    Years: 10.00   Types: Cigarettes    Quit date: 11/16/1988  . Smokeless tobacco: Never Used  . Alcohol use No  . Drug use: No  . Sexual activity: Not Currently   Other Topics Concern  . Not on file   Social History Narrative  . No narrative on file    Allergies  Allergen Reactions  . Ace Inhibitors     Other reaction(s): Cough  . Codeine Swelling  . Codeine Sulfate     Other reaction(s): Unknown  . Ezetimibe     Other reaction(s): Muscle Pain  . Other     Other reaction(s): Muscle Pain    Outpatient Medications Prior to Visit  Medication Sig Dispense Refill  . amLODipine (NORVASC) 5 MG tablet Take 1 tablet (5 mg total) by mouth daily. 90 tablet 1  . aspirin EC 81 MG tablet Take 1 tablet (81 mg total) by mouth daily. 90 tablet 3  . BAYER CONTOUR NEXT TEST test strip TEST 2 TIMES A DAY 200 each 3  . latanoprost (XALATAN) 0.005 % ophthalmic solution Place 1 drop into both eyes at bedtime.     Marland Kitchen losartan (COZAAR) 50 MG tablet TAKE 1 TABLET BY MOUTH DAILY. 90 tablet 0  . Multiple Vitamin (MULTIVITAMIN) capsule Take 1 capsule by mouth daily.    Marland Kitchen  NITROSTAT 0.4 MG SL tablet DISSOLVE ONE TABLET UNDER TONGUE AS NEED FOR CHEST PAIN AS DIRECTED 30 tablet 0  . nortriptyline (PAMELOR) 10 MG capsule Take 1 capsule (10 mg total) by mouth at bedtime. (Patient taking differently: Take 10 mg by mouth at bedtime. Dr Vanita Ingles) 90 capsule 1  . omeprazole (PRILOSEC) 20 MG capsule TAKE 1 CAPSULE BY MOUTH 2 TIMES DAILY. 180 capsule 3  . pravastatin (PRAVACHOL) 40 MG tablet Take 1 tablet (40 mg total) by mouth daily. (Patient taking differently: Take 40 mg by mouth every evening. ) 90 tablet 1  . sitaGLIPtin (JANUVIA) 100 MG tablet Take 1 tablet (100 mg total) by mouth daily. 90 tablet 1  . meloxicam (MOBIC) 7.5 MG tablet Take 1 tablet (7.5 mg total) by mouth daily. 30 tablet 0   No facility-administered medications prior to visit.     Review of Systems  Constitutional: Positive for diaphoresis. Negative for chills,  fever, malaise/fatigue and weight loss.  HENT: Positive for congestion, ear pain and sinus pressure. Negative for ear discharge, hoarse voice, sneezing and sore throat.   Eyes: Negative for blurred vision.  Respiratory: Negative for cough, sputum production, shortness of breath and wheezing.   Cardiovascular: Negative for chest pain, palpitations and leg swelling.  Gastrointestinal: Negative for abdominal pain, blood in stool, constipation, diarrhea, heartburn, melena and nausea.  Genitourinary: Negative for dysuria, frequency, hematuria and urgency.  Musculoskeletal: Negative for back pain, joint pain, myalgias and neck pain.  Skin: Negative for rash.  Neurological: Positive for headaches. Negative for dizziness, tingling, sensory change and focal weakness.  Endo/Heme/Allergies: Negative for environmental allergies and polydipsia. Does not bruise/bleed easily.  Psychiatric/Behavioral: Negative for depression and suicidal ideas. The patient is not nervous/anxious and does not have insomnia.      Objective  Vitals:   08/30/17 0812  BP: 120/62  Pulse: 60  Weight: 155 lb (70.3 kg)  Height: 5\' 11"  (1.803 m)    Physical Exam  Constitutional: She is well-developed, well-nourished, and in no distress. No distress.  HENT:  Head: Normocephalic and atraumatic.  Right Ear: External ear normal.  Left Ear: External ear normal.  Nose: Nose normal.  Mouth/Throat: Oropharynx is clear and moist.  Eyes: Pupils are equal, round, and reactive to light. Conjunctivae and EOM are normal. Right eye exhibits no discharge. Left eye exhibits no discharge.  Neck: Normal range of motion. Neck supple. No JVD present. No thyromegaly present.  Cardiovascular: Normal rate, regular rhythm, normal heart sounds and intact distal pulses.  Exam reveals no gallop and no friction rub.   No murmur heard. Pulmonary/Chest: Effort normal and breath sounds normal. She has no wheezes. She has no rales. Right breast exhibits  no inverted nipple, no mass, no nipple discharge, no skin change and no tenderness. Left breast exhibits no inverted nipple, no mass, no nipple discharge, no skin change and no tenderness. Breasts are symmetrical.  Abdominal: Soft. Bowel sounds are normal. She exhibits no mass. There is no tenderness. There is no guarding.  Musculoskeletal: Normal range of motion. She exhibits no edema.  Lymphadenopathy:    She has no cervical adenopathy.  Neurological: She is alert. She has normal sensation, normal strength, normal reflexes and intact cranial nerves. No cranial nerve deficit.  Skin: Skin is warm and dry. She is not diaphoretic.  Psychiatric: Mood and affect normal.  Nursing note and vitals reviewed.     Assessment & Plan  Problem List Items Addressed This Visit    None  Visit Diagnoses    Viral upper respiratory tract infection    -  Primary   Seasonal allergic rhinitis, unspecified trigger       Relevant Medications   montelukast (SINGULAIR) 10 MG tablet   Mental status, decreased       Breast screening       Relevant Orders   MM Digital Screening   Need for vaccination against Streptococcus pneumoniae using pneumococcal conjugate vaccine 13          Meds ordered this encounter  Medications  . montelukast (SINGULAIR) 10 MG tablet    Sig: Take 1 tablet (10 mg total) by mouth at bedtime.    Dispense:  30 tablet    Refill:  3      Dr. Macon [facility]  08/30/17

## 2017-09-02 ENCOUNTER — Ambulatory Visit (INDEPENDENT_AMBULATORY_CARE_PROVIDER_SITE_OTHER): Payer: 59

## 2017-09-02 DIAGNOSIS — Z23 Encounter for immunization: Secondary | ICD-10-CM | POA: Diagnosis not present

## 2017-09-07 ENCOUNTER — Other Ambulatory Visit: Payer: Self-pay | Admitting: Family Medicine

## 2017-09-07 DIAGNOSIS — IMO0001 Reserved for inherently not codable concepts without codable children: Secondary | ICD-10-CM

## 2017-09-07 DIAGNOSIS — E1165 Type 2 diabetes mellitus with hyperglycemia: Principal | ICD-10-CM

## 2017-09-07 DIAGNOSIS — I10 Essential (primary) hypertension: Secondary | ICD-10-CM

## 2017-10-12 DIAGNOSIS — H2511 Age-related nuclear cataract, right eye: Secondary | ICD-10-CM | POA: Diagnosis not present

## 2017-10-18 ENCOUNTER — Ambulatory Visit (INDEPENDENT_AMBULATORY_CARE_PROVIDER_SITE_OTHER): Payer: 59 | Admitting: Internal Medicine

## 2017-10-18 ENCOUNTER — Encounter: Payer: Self-pay | Admitting: Internal Medicine

## 2017-10-18 VITALS — BP 138/60 | HR 81 | Temp 98.7°F | Ht 68.0 in | Wt 157.0 lb

## 2017-10-18 DIAGNOSIS — J0100 Acute maxillary sinusitis, unspecified: Secondary | ICD-10-CM | POA: Diagnosis not present

## 2017-10-18 DIAGNOSIS — I2510 Atherosclerotic heart disease of native coronary artery without angina pectoris: Secondary | ICD-10-CM

## 2017-10-18 MED ORDER — AZITHROMYCIN 250 MG PO TABS: ORAL_TABLET | ORAL | 0 refills | Status: DC

## 2017-10-18 NOTE — Patient Instructions (Signed)

## 2017-10-18 NOTE — Progress Notes (Signed)
Date:  10/18/2017   Name:  Breanna Petty   DOB:  01-20-1941   MRN:  [id number]   Chief Complaint: Sinusitis (Started friday- nose and throat feels stuffy. At night time gets worse in throat. Thick mucous- unsure of color. Every now and then has aching in head and then goes away. )  Sinusitis  This is a new problem. The current episode started in the past 7 days. The problem has been gradually worsening since onset. There has been no fever. She is experiencing no pain. Associated symptoms include headaches, sinus pressure and a sore throat. Pertinent negatives include no chills, congestion, coughing, diaphoresis, ear pain or shortness of breath. Past treatments include nothing.     Review of Systems  Constitutional: Negative for chills and diaphoresis.  HENT: Positive for sinus pressure and sore throat. Negative for congestion and ear pain.   Respiratory: Negative for cough, chest tightness and shortness of breath.   Cardiovascular: Negative for chest pain.  Gastrointestinal: Negative for abdominal pain, diarrhea, nausea and vomiting.  Neurological: Positive for headaches. Negative for dizziness and syncope.    Patient Active Problem List   Diagnosis Date Noted  . Status post exploratory laparotomy 10/21/2016  . Myocardial infarction ([place]) 10/20/2016  . SBO (small bowel obstruction) ([place])   . Aortic atherosclerosis ([place]) 10/02/2016  . Diverticulosis of large intestine without hemorrhage 10/02/2016  . Acute diverticulitis 10/02/2016  . Hammer toe of right foot 10/16/2015  . Nail deformity 10/16/2015  . Chronic obstructive pulmonary disease ([place]) 09/04/2015  . History of MI (myocardial infarction) 09/04/2015  . Spondylolisthesis at L4-L5 level 07/11/2015  . DDD (degenerative disc disease), lumbar 07/11/2015  . History of sarcoidosis 06/04/2015  . Urge incontinence of urine 06/04/2015  . Hearing loss of both ears 05/07/2015  . Anxiety and depression 05/04/2015  .  Arteriosclerosis of coronary artery 05/04/2015  . Type 2 diabetes mellitus, uncontrolled ([place]) 05/04/2015  . Dyslipidemia 05/04/2015  . Hypertension, benign 05/04/2015  . Gastro-esophageal reflux disease without esophagitis 05/04/2015  . Hiatal hernia 05/04/2015  . HZV (herpes zoster virus) post herpetic neuralgia 05/04/2015  . Vitamin D deficiency 05/04/2015  . S/P coronary artery stent placement 05/04/2015    Prior to Admission medications   Medication Sig Start Date End Date Taking? Authorizing Provider  amLODipine (NORVASC) 5 MG tablet TAKE 1 TABLET (5 MG TOTAL) BY MOUTH DAILY. 09/07/17  Yes Juline Patch, MD  aspirin EC 81 MG tablet Take 1 tablet (81 mg total) by mouth daily. 12/29/16  Yes Juline Patch, MD  BAYER CONTOUR NEXT TEST test strip TEST 2 TIMES A DAY 12/26/15  Yes Sowles, Drue Stager, MD  JANUVIA 100 MG tablet TAKE 1 TABLET (100 MG TOTAL) BY MOUTH DAILY. 09/07/17  Yes Juline Patch, MD  latanoprost (XALATAN) 0.005 % ophthalmic solution Place 1 drop into both eyes at bedtime.    Yes [provider]  losartan (COZAAR) 50 MG tablet TAKE 1 TABLET BY MOUTH DAILY. 08/10/17  Yes Juline Patch, MD  montelukast (SINGULAIR) 10 MG tablet Take 1 tablet (10 mg total) by mouth at bedtime. 08/30/17  Yes Juline Patch, MD  Multiple Vitamin (MULTIVITAMIN) capsule Take 1 capsule by mouth daily.   Yes [provider]  NITROSTAT 0.4 MG SL tablet DISSOLVE ONE TABLET UNDER TONGUE AS NEED FOR CHEST PAIN AS DIRECTED 11/28/15  Yes Sowles, Drue Stager, MD  nortriptyline (PAMELOR) 10 MG capsule Take 1 capsule (10 mg total) by mouth at bedtime. Patient  taking differently: Take 10 mg by mouth at bedtime. Dr Vanita Ingles 08/24/16  Yes Juline Patch, MD  omeprazole (PRILOSEC) 20 MG capsule TAKE 1 CAPSULE BY MOUTH 2 TIMES DAILY. 12/29/16  Yes Juline Patch, MD  pravastatin (PRAVACHOL) 40 MG tablet Take 1 tablet (40 mg total) by mouth daily. Patient taking differently: Take 40 mg by mouth every  evening.  12/29/16  Yes Juline Patch, MD    Allergies  Allergen Reactions  . Ace Inhibitors     Other reaction(s): Cough  . Codeine Swelling  . Codeine Sulfate     Other reaction(s): Unknown  . Ezetimibe     Other reaction(s): Muscle Pain  . Other     Other reaction(s): Muscle Pain    Past Surgical History:  Procedure Laterality Date  . ABDOMINAL HYSTERECTOMY  1979  . APPENDECTOMY    . CARDIAC SURGERY    . LAPAROTOMY N/A 10/04/2016   Procedure: EXPLORATORY LAPAROTOMY;  Surgeon: Dia Crawford III, MD;  Location: [facility] ORS;  Service: General;  Laterality: N/A;    Social History   Tobacco Use  . Smoking status: Former Smoker    Packs/day: 2.00    Years: 10.00    Pack years: 20.00    Types: Cigarettes    Last attempt to quit: 11/16/1988    Years since quitting: 28.9  . Smokeless tobacco: Never Used  Substance Use Topics  . Alcohol use: No    Alcohol/week: 0.0 oz  . Drug use: No     Medication list has been reviewed and updated.  PHQ 2/9 Scores 08/30/2017 08/30/2017 08/11/2017 06/18/2017  PHQ - 2 Score 0 0 0 1  PHQ- 9 Score 2 - - -    Physical Exam  Constitutional: She is oriented to person, place, and time. She appears well-developed and well-nourished.  HENT:  Right Ear: External ear normal.  Left Ear: External ear normal.  Nose: Right sinus exhibits maxillary sinus tenderness and frontal sinus tenderness. Left sinus exhibits maxillary sinus tenderness and frontal sinus tenderness.  Mouth/Throat: Uvula is midline and mucous membranes are normal. No oral lesions. Posterior oropharyngeal erythema present. No oropharyngeal exudate.  Cardiovascular: Normal rate, regular rhythm and normal heart sounds.  Pulmonary/Chest: Breath sounds normal. She has no wheezes. She has no rales.  Lymphadenopathy:    She has no cervical adenopathy.  Neurological: She is alert and oriented to person, place, and time.    BP 138/60   Pulse 81   Temp 98.7 F (37.1 C) (Oral)   Ht 5\' 8"   (1.727 m)   Wt 157 lb (71.2 kg)   SpO2 100%   BMI 23.87 kg/m   Assessment and Plan 1. Acute non-recurrent maxillary sinusitis Rest and fluids - azithromycin (ZITHROMAX Z-PAK) 250 MG tablet; UAD  Dispense: 6 each; Refill: 0   Meds ordered this encounter  Medications  . azithromycin (ZITHROMAX Z-PAK) 250 MG tablet    Sig: UAD    Dispense:  6 each    Refill:  0    Partially dictated using Editor, commissioning. Any errors are unintentional.  Halina Maidens, MD [facility]  10/18/2017

## 2017-10-20 DIAGNOSIS — H2511 Age-related nuclear cataract, right eye: Secondary | ICD-10-CM | POA: Diagnosis not present

## 2017-10-26 ENCOUNTER — Encounter: Payer: Self-pay | Admitting: *Deleted

## 2017-10-28 ENCOUNTER — Ambulatory Visit
Admission: RE | Admit: 2017-10-28 | Discharge: 2017-10-28 | Disposition: A | Discharge status: Home / Self Care | Payer: 59 | Source: Ambulatory Visit | Attending: Ophthalmology | Admitting: Ophthalmology

## 2017-10-28 ENCOUNTER — Encounter
Admission: RE | Disposition: A | Discharge status: Home / Self Care | Payer: Self-pay | Source: Ambulatory Visit | Attending: Ophthalmology

## 2017-10-28 ENCOUNTER — Ambulatory Visit: Payer: 59 | Admitting: Anesthesiology

## 2017-10-28 DIAGNOSIS — H2511 Age-related nuclear cataract, right eye: Secondary | ICD-10-CM | POA: Diagnosis not present

## 2017-10-28 DIAGNOSIS — E119 Type 2 diabetes mellitus without complications: Secondary | ICD-10-CM | POA: Diagnosis not present

## 2017-10-28 DIAGNOSIS — Z9889 Other specified postprocedural states: Secondary | ICD-10-CM | POA: Diagnosis not present

## 2017-10-28 DIAGNOSIS — Z888 Allergy status to other drugs, medicaments and biological substances status: Secondary | ICD-10-CM | POA: Insufficient documentation

## 2017-10-28 DIAGNOSIS — Z87891 Personal history of nicotine dependence: Secondary | ICD-10-CM | POA: Diagnosis not present

## 2017-10-28 DIAGNOSIS — Z885 Allergy status to narcotic agent status: Secondary | ICD-10-CM | POA: Diagnosis not present

## 2017-10-28 DIAGNOSIS — E785 Hyperlipidemia, unspecified: Secondary | ICD-10-CM | POA: Insufficient documentation

## 2017-10-28 DIAGNOSIS — I10 Essential (primary) hypertension: Secondary | ICD-10-CM | POA: Diagnosis not present

## 2017-10-28 DIAGNOSIS — Z8619 Personal history of other infectious and parasitic diseases: Secondary | ICD-10-CM | POA: Insufficient documentation

## 2017-10-28 DIAGNOSIS — Z79899 Other long term (current) drug therapy: Secondary | ICD-10-CM | POA: Diagnosis not present

## 2017-10-28 DIAGNOSIS — I2510 Atherosclerotic heart disease of native coronary artery without angina pectoris: Secondary | ICD-10-CM | POA: Insufficient documentation

## 2017-10-28 DIAGNOSIS — Z9849 Cataract extraction status, unspecified eye: Secondary | ICD-10-CM | POA: Insufficient documentation

## 2017-10-28 DIAGNOSIS — D869 Sarcoidosis, unspecified: Secondary | ICD-10-CM | POA: Diagnosis not present

## 2017-10-28 DIAGNOSIS — I252 Old myocardial infarction: Secondary | ICD-10-CM | POA: Diagnosis not present

## 2017-10-28 DIAGNOSIS — F418 Other specified anxiety disorders: Secondary | ICD-10-CM | POA: Diagnosis not present

## 2017-10-28 DIAGNOSIS — J449 Chronic obstructive pulmonary disease, unspecified: Secondary | ICD-10-CM | POA: Insufficient documentation

## 2017-10-28 DIAGNOSIS — Z7982 Long term (current) use of aspirin: Secondary | ICD-10-CM | POA: Insufficient documentation

## 2017-10-28 DIAGNOSIS — E1136 Type 2 diabetes mellitus with diabetic cataract: Secondary | ICD-10-CM | POA: Insufficient documentation

## 2017-10-28 DIAGNOSIS — K219 Gastro-esophageal reflux disease without esophagitis: Secondary | ICD-10-CM | POA: Diagnosis not present

## 2017-10-28 DIAGNOSIS — K449 Diaphragmatic hernia without obstruction or gangrene: Secondary | ICD-10-CM | POA: Insufficient documentation

## 2017-10-28 HISTORY — DX: Cardiac arrhythmia, unspecified: I49.9

## 2017-10-28 HISTORY — PX: CATARACT EXTRACTION W/PHACO: SHX586

## 2017-10-28 HISTORY — DX: Unspecified osteoarthritis, unspecified site: M19.90

## 2017-10-28 HISTORY — DX: Unspecified hearing loss, unspecified ear: H91.90

## 2017-10-28 HISTORY — DX: Acute myocardial infarction, unspecified: I21.9

## 2017-10-28 HISTORY — DX: Angina pectoris, unspecified: I20.9

## 2017-10-28 LAB — GLUCOSE, CAPILLARY: GLUCOSE-CAPILLARY: 103 mg/dL — AB (ref 65–99)

## 2017-10-28 SURGERY — PHACOEMULSIFICATION, CATARACT, WITH IOL INSERTION
Anesthesia: Monitor Anesthesia Care | Site: Eye | Laterality: Right | Wound class: Clean

## 2017-10-28 MED ORDER — FENTANYL CITRATE (PF) 100 MCG/2ML IJ SOLN
INTRAMUSCULAR | Status: DC | PRN
  Administered 2017-10-28: 50 ug via INTRAVENOUS

## 2017-10-28 MED ORDER — SODIUM HYALURONATE 23 MG/ML IO SOLN
INTRAOCULAR | Status: AC
  Filled 2017-10-28: qty 0.6

## 2017-10-28 MED ORDER — POVIDONE-IODINE 5 % OP SOLN
OPHTHALMIC | Status: AC
  Filled 2017-10-28: qty 30

## 2017-10-28 MED ORDER — EPINEPHRINE PF 1 MG/ML IJ SOLN
INTRAMUSCULAR | Status: AC
  Filled 2017-10-28: qty 1

## 2017-10-28 MED ORDER — LIDOCAINE HCL (PF) 4 % IJ SOLN
INTRAOCULAR | Status: DC | PRN
  Administered 2017-10-28: 4 mL via OPHTHALMIC

## 2017-10-28 MED ORDER — LIDOCAINE HCL (PF) 4 % IJ SOLN
INTRAMUSCULAR | Status: AC
  Filled 2017-10-28: qty 5

## 2017-10-28 MED ORDER — TRYPAN BLUE 0.06 % OP SOLN
OPHTHALMIC | Status: DC | PRN
  Administered 2017-10-28: 0.5 mL via INTRAOCULAR

## 2017-10-28 MED ORDER — FENTANYL CITRATE (PF) 100 MCG/2ML IJ SOLN
INTRAMUSCULAR | Status: AC
  Filled 2017-10-28: qty 2

## 2017-10-28 MED ORDER — MOXIFLOXACIN HCL 0.5 % OP SOLN
OPHTHALMIC | Status: DC | PRN
  Administered 2017-10-28: 0.2 mL via OPHTHALMIC

## 2017-10-28 MED ORDER — SODIUM HYALURONATE 10 MG/ML IO SOLN
INTRAOCULAR | Status: DC | PRN
  Administered 2017-10-28: 0.55 mL via INTRAOCULAR

## 2017-10-28 MED ORDER — SODIUM HYALURONATE 23 MG/ML IO SOLN
INTRAOCULAR | Status: DC | PRN
Start: 2017-10-28 — End: 2017-10-28
  Administered 2017-10-28: 0.6 mL via INTRAOCULAR

## 2017-10-28 MED ORDER — MOXIFLOXACIN HCL 0.5 % OP SOLN
OPHTHALMIC | Status: AC
  Filled 2017-10-28: qty 3

## 2017-10-28 MED ORDER — MIDAZOLAM HCL 2 MG/2ML IJ SOLN
INTRAMUSCULAR | Status: AC
  Filled 2017-10-28: qty 2

## 2017-10-28 MED ORDER — SODIUM CHLORIDE 0.9 % IV SOLN
INTRAVENOUS | Status: DC
  Administered 2017-10-28: 07:00:00 via INTRAVENOUS

## 2017-10-28 MED ORDER — ARMC OPHTHALMIC DILATING DROPS
OPHTHALMIC | Status: AC
  Administered 2017-10-28: 1 via OPHTHALMIC
  Filled 2017-10-28: qty 0.4

## 2017-10-28 MED ORDER — MOXIFLOXACIN HCL 0.5 % OP SOLN
1.0000 [drp] | OPHTHALMIC | Status: DC | PRN
Start: 2017-10-28 — End: 2017-10-28

## 2017-10-28 MED ORDER — MIDAZOLAM HCL 2 MG/2ML IJ SOLN
INTRAMUSCULAR | Status: DC | PRN
  Administered 2017-10-28 (×2): 1 mg via INTRAVENOUS

## 2017-10-28 MED ORDER — BSS IO SOLN
INTRAOCULAR | Status: DC | PRN
  Administered 2017-10-28: 1 via INTRAOCULAR

## 2017-10-28 MED ORDER — ARMC OPHTHALMIC DILATING DROPS
1.0000 "application " | OPHTHALMIC | Status: AC
  Administered 2017-10-28 (×3): 1 via OPHTHALMIC

## 2017-10-28 MED ORDER — POVIDONE-IODINE 5 % OP SOLN
OPHTHALMIC | Status: DC | PRN
  Administered 2017-10-28: 1 via OPHTHALMIC

## 2017-10-28 SURGICAL SUPPLY — 16 items
DISSECTOR HYDRO NUCLEUS 50X22 (MISCELLANEOUS) ×2 IMPLANT
GLOVE BIO SURGEON STRL SZ8 (GLOVE) ×2 IMPLANT
GLOVE BIOGEL M 6.5 STRL (GLOVE) ×2 IMPLANT
GLOVE SURG LX 7.5 STRW (GLOVE) ×1
GLOVE SURG LX STRL 7.5 STRW (GLOVE) ×1 IMPLANT
GOWN STRL REUS W/ TWL LRG LVL3 (GOWN DISPOSABLE) ×2 IMPLANT
GOWN STRL REUS W/TWL LRG LVL3 (GOWN DISPOSABLE) ×2
LABEL CATARACT MEDS ST (LABEL) ×2 IMPLANT
LENS IOL ACRYSOF IQ 22.5 (Intraocular Lens) ×2 IMPLANT
PACK CATARACT (MISCELLANEOUS) ×2 IMPLANT
PACK CATARACT KING (MISCELLANEOUS) ×2 IMPLANT
PACK EYE AFTER SURG (MISCELLANEOUS) ×2 IMPLANT
SOL BSS BAG (MISCELLANEOUS) ×2
SOLUTION BSS BAG (MISCELLANEOUS) ×1 IMPLANT
WATER STERILE IRR 250ML POUR (IV SOLUTION) ×2 IMPLANT
WIPE NON LINTING 3.25X3.25 (MISCELLANEOUS) ×2 IMPLANT

## 2017-10-28 NOTE — Op Note (Signed)
OPERATIVE NOTE  ALBERTHA BEATTIE [id number] 10/28/2017   PREOPERATIVE DIAGNOSIS:  Nuclear sclerotic cataract right eye.  H25.11   POSTOPERATIVE DIAGNOSIS:    Nuclear sclerotic cataract right eye.     PROCEDURE:  Phacoemusification with posterior chamber intraocular lens placement of the right eye   LENS:   Implant Name Type Inv. Item Serial No. Manufacturer Lot No. LRB No. Used  LENS IOL ACRYSOF IQ 22.5 - [id number] Intraocular Lens LENS IOL ACRYSOF IQ 22.5 [id number] ALCON  Right 1       [id number] 22.5   ULTRASOUND TIME: 0 minutes 53.9 seconds.  CDE 5.71   SURGEON:  Benay Pillow, MD, MPH  ANESTHESIOLOGIST: Anesthesiologist: Emmie Niemann, MD CRNA: Rolla Plate, CRNA   ANESTHESIA:  Topical with tetracaine drops augmented with 1% preservative-free intracameral lidocaine.  ESTIMATED BLOOD LOSS: less than 1 mL.   COMPLICATIONS:  None.   DESCRIPTION OF PROCEDURE:  The patient was identified in the holding room and transported to the operating room and placed in the supine position under the operating microscope.  The right eye was identified as the operative eye and it was prepped and draped in the usual sterile ophthalmic fashion.   A 1.0 millimeter clear-corneal paracentesis was made at the 10:30 position. 0.5 ml of preservative-free 1% lidocaine with epinephrine was injected into the anterior chamber.  The anterior chamber was filled with Healon 5 viscoelastic.  A 2.4 millimeter keratome was used to make a near-clear corneal incision at the 8:00 position.  A curvilinear capsulorrhexis was made with a cystotome and capsulorrhexis forceps.  Balanced salt solution was used to hydrodissect and hydrodelineate the nucleus.   Phacoemulsification was then used in stop and chop fashion to remove the lens nucleus and epinucleus.  The remaining cortex was then removed using the irrigation and aspiration handpiece. Healon was then placed into the capsular bag to distend it for lens  placement.  A lens was then injected into the capsular bag.  The remaining viscoelastic was aspirated.   Wounds were hydrated with balanced salt solution.  The anterior chamber was inflated to a physiologic pressure with balanced salt solution.   Intracameral vigamox 0.1 mL undiluted was injected into the eye and a drop placed onto the ocular surface.  No wound leaks were noted.  The patient was taken to the recovery room in stable condition without complications of anesthesia or surgery  Benay Pillow 10/28/2017, 8:06 AM

## 2017-10-28 NOTE — Transfer of Care (Signed)
Immediate Anesthesia Transfer of Care Note  Patient: Breanna Petty  Procedure(s) Performed: CATARACT EXTRACTION PHACO AND INTRAOCULAR LENS PLACEMENT (IOC) (Right Eye)  Patient Location: PACU  Anesthesia Type:MAC  Level of Consciousness: awake and alert   Airway & Oxygen Therapy: Patient Spontanous Breathing  Post-op Assessment: Report given to RN and Post -op Vital signs reviewed and stable  Post vital signs: Reviewed  Last Vitals:  Vitals:   10/28/17 0809 10/28/17 0811  BP: (!) 141/63 116/75  Pulse: 68 85  Resp: 16 16  Temp: (!) 36.2 C (!) 36.2 C  SpO2: 100% 97%    Last Pain:  Vitals:   10/28/17 0809  TempSrc: Temporal         Complications: No apparent anesthesia complications

## 2017-10-28 NOTE — Anesthesia Post-op Follow-up Note (Signed)
Anesthesia QCDR form completed.        

## 2017-10-28 NOTE — H&P (Signed)
The History and Physical notes are on paper, have been signed, and are to be scanned.   I have examined the patient and there are no changes to the H&P.   Benay Pillow 10/28/2017 7:19 AM

## 2017-10-28 NOTE — Anesthesia Postprocedure Evaluation (Signed)
Anesthesia Post Note  Patient: Breanna Petty  Procedure(s) Performed: CATARACT EXTRACTION PHACO AND INTRAOCULAR LENS PLACEMENT (IOC) (Right Eye)  Patient location during evaluation: Short Stay Anesthesia Type: MAC Level of consciousness: awake Pain management: pain level controlled Vital Signs Assessment: post-procedure vital signs reviewed and stable Respiratory status: spontaneous breathing Cardiovascular status: blood pressure returned to baseline Postop Assessment: no headache Anesthetic complications: no     Last Vitals:  Vitals:   10/28/17 0809 10/28/17 0811  BP: (!) 141/63 116/75  Pulse: 68 85  Resp: 16 16  Temp: (!) 36.2 C (!) 36.2 C  SpO2: 100% 97%    Last Pain:  Vitals:   10/28/17 0809  TempSrc: Temporal                 Brantley Fling

## 2017-10-28 NOTE — Anesthesia Preprocedure Evaluation (Signed)
Anesthesia Evaluation  Patient identified by MRN, date of birth, ID band Patient awake    Reviewed: Allergy & Precautions, NPO status , Patient's Chart, lab work & pertinent test results  History of Anesthesia Complications Negative for: history of anesthetic complications  Airway Mallampati: III  TM Distance: >3 FB Neck ROM: Full    Dental  (+) Upper Dentures, Lower Dentures   Pulmonary neg sleep apnea, COPD, former smoker,    breath sounds clear to auscultation- rhonchi (-) wheezing      Cardiovascular hypertension, + CAD, + Past MI and + Cardiac Stents (~28yr ago)   Rhythm:Regular Rate:Normal - Systolic murmurs and - Diastolic murmurs    Neuro/Psych PSYCHIATRIC DISORDERS Anxiety Depression negative neurological ROS     GI/Hepatic Neg liver ROS, hiatal hernia, GERD  ,  Endo/Other  diabetes, Oral Hypoglycemic Agents  Renal/GU negative Renal ROS     Musculoskeletal  (+) Arthritis ,   Abdominal (+) - obese,   Peds  Hematology negative hematology ROS (+)   Anesthesia Other Findings Past Medical History: No date: Anginal pain (HCC) No date: Anxiety No date: Aortic atherosclerosis (HCC) No date: Arthritis No date: Atherosclerotic heart disease No date: Depression No date: Diabetes mellitus without complication (HCC) No date: Diverticulosis of colon No date: Dysrhythmia No date: GERD (gastroesophageal reflux disease) No date: Hiatal hernia No date: History of heart artery stent No date: History of shingles No date: HOH (hard of hearing) No date: Hyperlipidemia No date: Hypertension No date: Myocardial infarction ([place] No date: Sarcoidosis No date: Vitamin D deficiency   Reproductive/Obstetrics                             Anesthesia Physical Anesthesia Plan  ASA: III  Anesthesia Plan: MAC   Post-op Pain Management:    Induction: Intravenous  PONV Risk Score and Plan: 2 and  Midazolam  Airway Management Planned: Natural Airway  Additional Equipment:   Intra-op Plan:   Post-operative Plan:   Informed Consent: I have reviewed the patients History and Physical, chart, labs and discussed the procedure including the risks, benefits and alternatives for the proposed anesthesia with the patient or authorized representative who has indicated his/her understanding and acceptance.     Plan Discussed with: CRNA and Anesthesiologist  Anesthesia Plan Comments:         Anesthesia Quick Evaluation

## 2017-10-28 NOTE — Discharge Instructions (Signed)
°  FOLLOW DR. Melony Overly POSTOP EYE DROP INSTRUCTION SHEET AS REVIEWED.  Eye Surgery Discharge Instructions  Expect mild scratchy sensation or mild soreness. DO NOT RUB YOUR EYE!  The day of surgery:  Minimal physical activity, but bed rest is not required  No reading, computer work, or close hand work  No bending, lifting, or straining.  May watch TV  For 24 hours:  No driving, legal decisions, or alcoholic beverages  Safety precautions  Eat anything you prefer: It is better to start with liquids, then soup then solid foods.  _____ Eye patch should be worn until postoperative exam tomorrow.  ____ Solar shield eyeglasses should be worn for comfort in the sunlight/patch while sleeping  Resume all regular medications including aspirin or Coumadin if these were discontinued prior to surgery. You may shower, bathe, shave, or wash your hair. Tylenol may be taken for mild discomfort.  Call your doctor if you experience significant pain, nausea, or vomiting, fever > 101 or other signs of infection. [phone] or [phone] Specific instructions:  Follow-up Information    Eulogio Bear, MD Follow up.   Specialty:  Ophthalmology Why:  Friday 10/29/17 @ 10:00 am - IN THE [facility] OFFICE Contact information: [address] [place] [address] [phone]

## 2017-11-01 ENCOUNTER — Encounter: Payer: Self-pay | Admitting: Family Medicine

## 2017-11-01 ENCOUNTER — Ambulatory Visit (INDEPENDENT_AMBULATORY_CARE_PROVIDER_SITE_OTHER): Payer: 59 | Admitting: Family Medicine

## 2017-11-01 VITALS — BP 124/82 | HR 74 | Resp 16 | Ht 71.0 in | Wt 158.0 lb

## 2017-11-01 DIAGNOSIS — M7521 Bicipital tendinitis, right shoulder: Secondary | ICD-10-CM | POA: Diagnosis not present

## 2017-11-01 DIAGNOSIS — I2510 Atherosclerotic heart disease of native coronary artery without angina pectoris: Secondary | ICD-10-CM

## 2017-11-01 NOTE — Progress Notes (Signed)
Name: Breanna Petty   MRN: [id number]    DOB: [birth year]   Date:11/01/2017       Progress Note  Subjective  Chief Complaint  Chief Complaint  Patient presents with  . Arm Pain    Right Front of Arm x 2 week denies injury     Arm Pain   The incident occurred more than 1 week ago. There was no injury mechanism. The pain is present in the upper right arm. The quality of the pain is described as aching. The pain does not radiate. The pain is at a severity of 2/10. The pain is mild. The pain has been fluctuating since the incident. Pertinent negatives include no chest pain, muscle weakness, numbness or tingling. She has tried nothing for the symptoms.    No problem-specific Assessment & Plan notes found for this encounter.   Past Medical History:  Diagnosis Date  . Anginal pain ([place])   . Anxiety   . Aortic atherosclerosis ([place])   . Arthritis   . Atherosclerotic heart disease   . Depression   . Diabetes mellitus without complication ([place])   . Diverticulosis of colon   . Dysrhythmia   . GERD (gastroesophageal reflux disease)   . Hiatal hernia   . History of heart artery stent   . History of shingles   . HOH (hard of hearing)   . Hyperlipidemia   . Hypertension   . Myocardial infarction ([place])   . Sarcoidosis   . Vitamin D deficiency     Past Surgical History:  Procedure Laterality Date  . ABDOMINAL HYSTERECTOMY  1979  . APPENDECTOMY    . CARDIAC SURGERY    . CATARACT EXTRACTION W/PHACO Right 10/28/2017   Procedure: CATARACT EXTRACTION PHACO AND INTRAOCULAR LENS PLACEMENT (IOC);  Surgeon: Eulogio Bear, MD;  Location: [facility] ORS;  Service: Ophthalmology;  Laterality: Right;  Lot # [id number] H [place]: 00:53.9 AP%: 10.6 CDE: 5.71  . CORONARY ANGIOPLASTY     STENT  . LAPAROTOMY N/A 10/04/2016   Procedure: EXPLORATORY LAPAROTOMY;  Surgeon: Dia Crawford III, MD;  Location: [facility] ORS;  Service: General;  Laterality: N/A;    Family History  Problem Relation Age of Onset  .  Diabetes Daughter   . Hypertension Mother   . Diabetes Son     Social History   Socioeconomic History  . Marital status: Single    Spouse name: Not on file  . Number of children: Not on file  . Years of education: Not on file  . Highest education level: Not on file  Social Needs  . Financial resource strain: Not on file  . Food insecurity - worry: Not on file  . Food insecurity - inability: Not on file  . Transportation needs - medical: Not on file  . Transportation needs - non-medical: Not on file  Occupational History  . Not on file  Tobacco Use  . Smoking status: Former Smoker    Packs/day: 2.00    Years: 10.00    Pack years: 20.00    Types: Cigarettes    Last attempt to quit: 11/16/1988    Years since quitting: 28.9  . Smokeless tobacco: Never Used  Substance and Sexual Activity  . Alcohol use: No    Alcohol/week: 0.0 oz  . Drug use: No  . Sexual activity: Not Currently  Other Topics Concern  . Not on file  Social History Narrative  . Not on file    Allergies  Allergen Reactions  . Ace  Inhibitors     Other reaction(s): Cough  . Codeine Swelling  . Codeine Sulfate     Other reaction(s): Unknown  . Ezetimibe     Other reaction(s): Muscle Pain  . Other     Other reaction(s): Muscle Pain    Outpatient Medications Prior to Visit  Medication Sig Dispense Refill  . amLODipine (NORVASC) 5 MG tablet TAKE 1 TABLET (5 MG TOTAL) BY MOUTH DAILY. 90 tablet 0  . aspirin EC 81 MG tablet Take 1 tablet (81 mg total) by mouth daily. 90 tablet 3  . BAYER CONTOUR NEXT TEST test strip TEST 2 TIMES A DAY 200 each 3  . JANUVIA 100 MG tablet TAKE 1 TABLET (100 MG TOTAL) BY MOUTH DAILY. 90 tablet 0  . latanoprost (XALATAN) 0.005 % ophthalmic solution Place 1 drop into both eyes at bedtime.     Marland Kitchen losartan (COZAAR) 50 MG tablet TAKE 1 TABLET BY MOUTH DAILY. 90 tablet 0  . montelukast (SINGULAIR) 10 MG tablet Take 1 tablet (10 mg total) by mouth at bedtime. 30 tablet 3  . Multiple  Vitamin (MULTIVITAMIN) capsule Take 1 capsule by mouth daily.    Marland Kitchen NITROSTAT 0.4 MG SL tablet DISSOLVE ONE TABLET UNDER TONGUE AS NEED FOR CHEST PAIN AS DIRECTED 30 tablet 0  . nortriptyline (PAMELOR) 10 MG capsule Take 1 capsule (10 mg total) by mouth at bedtime. (Patient taking differently: Take 10 mg by mouth at bedtime. Dr Vanita Ingles) 90 capsule 1  . omeprazole (PRILOSEC) 20 MG capsule TAKE 1 CAPSULE BY MOUTH 2 TIMES DAILY. (Patient taking differently: 20 mg daily. TAKE 1 CAPSULE BY MOUTH 2 TIMES DAILY.) 180 capsule 3  . pravastatin (PRAVACHOL) 40 MG tablet Take 1 tablet (40 mg total) by mouth daily. (Patient taking differently: Take 40 mg by mouth every evening. ) 90 tablet 1  . azithromycin (ZITHROMAX Z-PAK) 250 MG tablet UAD (Patient not taking: Reported on 10/28/2017) 6 each 0   No facility-administered medications prior to visit.     Review of Systems  Constitutional: Negative for chills, fever, malaise/fatigue and weight loss.  HENT: Negative for ear discharge, ear pain and sore throat.   Eyes: Negative for blurred vision.  Respiratory: Negative for cough, sputum production, shortness of breath and wheezing.   Cardiovascular: Negative for chest pain, palpitations and leg swelling.  Gastrointestinal: Negative for abdominal pain, blood in stool, constipation, diarrhea, heartburn, melena and nausea.  Genitourinary: Negative for dysuria, frequency, hematuria and urgency.  Musculoskeletal: Negative for back pain, joint pain, myalgias and neck pain.  Skin: Negative for rash.  Neurological: Negative for dizziness, tingling, sensory change, focal weakness, numbness and headaches.  Endo/Heme/Allergies: Negative for environmental allergies and polydipsia. Does not bruise/bleed easily.  Psychiatric/Behavioral: Negative for depression and suicidal ideas. The patient is not nervous/anxious and does not have insomnia.      Objective  Vitals:   11/01/17 0956  BP: 124/82  Pulse: 74  Resp: 16   SpO2: 99%  Weight: 158 lb (71.7 kg)  Height: 5\' 11"  (1.803 m)    Physical Exam  Constitutional: She is well-developed, well-nourished, and in no distress. No distress.  HENT:  Head: Normocephalic and atraumatic.  Right Ear: External ear normal.  Left Ear: External ear normal.  Nose: Nose normal.  Mouth/Throat: Oropharynx is clear and moist.  Eyes: Conjunctivae and EOM are normal. Pupils are equal, round, and reactive to light. Right eye exhibits no discharge. Left eye exhibits no discharge.  Right scleral hemorrhage /s/p cataract surgery  Neck: Normal range of motion. Neck supple. No JVD present. No thyromegaly present.  Cardiovascular: Normal rate, regular rhythm, normal heart sounds and intact distal pulses. Exam reveals no gallop and no friction rub.  No murmur heard. Pulmonary/Chest: Effort normal and breath sounds normal. She has no wheezes. She has no rales.  Abdominal: Soft. Bowel sounds are normal. She exhibits no mass. There is no tenderness. There is no guarding.  Musculoskeletal: Normal range of motion. She exhibits no edema.       Right upper arm: She exhibits tenderness. She exhibits no bony tenderness, no swelling, no edema and no deformity.  Pain with resisted flexion  Lymphadenopathy:    She has no cervical adenopathy.  Neurological: She is alert. She has normal reflexes.  Skin: Skin is warm and dry. She is not diaphoretic.  Psychiatric: Mood and affect normal.  Nursing note and vitals reviewed.     Assessment & Plan  Problem List Items Addressed This Visit    None    Visit Diagnoses    Biceps tendinitis of right upper extremity    -  Primary   suggest tylenol due to recent surgery      No orders of the defined types were placed in this encounter.     Dr. Macon [facility]  11/01/17

## 2017-11-24 ENCOUNTER — Ambulatory Visit: Payer: Self-pay | Admitting: Pharmacist

## 2017-11-25 ENCOUNTER — Ambulatory Visit (INDEPENDENT_AMBULATORY_CARE_PROVIDER_SITE_OTHER): Payer: 59 | Admitting: Family Medicine

## 2017-11-25 VITALS — BP 120/70 | HR 80 | Ht 71.0 in | Wt 158.0 lb

## 2017-11-25 DIAGNOSIS — IMO0001 Reserved for inherently not codable concepts without codable children: Secondary | ICD-10-CM

## 2017-11-25 DIAGNOSIS — I10 Essential (primary) hypertension: Secondary | ICD-10-CM | POA: Diagnosis not present

## 2017-11-25 DIAGNOSIS — R109 Unspecified abdominal pain: Secondary | ICD-10-CM

## 2017-11-25 DIAGNOSIS — E785 Hyperlipidemia, unspecified: Secondary | ICD-10-CM | POA: Diagnosis not present

## 2017-11-25 DIAGNOSIS — I2510 Atherosclerotic heart disease of native coronary artery without angina pectoris: Secondary | ICD-10-CM | POA: Diagnosis not present

## 2017-11-25 DIAGNOSIS — E1165 Type 2 diabetes mellitus with hyperglycemia: Secondary | ICD-10-CM

## 2017-11-25 DIAGNOSIS — J449 Chronic obstructive pulmonary disease, unspecified: Secondary | ICD-10-CM | POA: Diagnosis not present

## 2017-11-25 LAB — POCT URINALYSIS DIPSTICK
Bilirubin, UA: NEGATIVE
Blood, UA: NEGATIVE
GLUCOSE UA: NEGATIVE
Ketones, UA: NEGATIVE
LEUKOCYTES UA: NEGATIVE
NITRITE UA: NEGATIVE
PROTEIN UA: NEGATIVE
Spec Grav, UA: 1.01 (ref 1.010–1.025)
Urobilinogen, UA: 0.2 E.U./dL
pH, UA: 5 (ref 5.0–8.0)

## 2017-11-25 MED ORDER — LOSARTAN POTASSIUM 50 MG PO TABS: 50.0000 mg | ORAL_TABLET | Freq: Every day | ORAL | 1 refills | Status: DC

## 2017-11-25 MED ORDER — AMLODIPINE BESYLATE 5 MG PO TABS: 5.0000 mg | ORAL_TABLET | Freq: Every day | ORAL | 1 refills | Status: DC

## 2017-11-25 MED ORDER — PRAVASTATIN SODIUM 40 MG PO TABS: 40.0000 mg | ORAL_TABLET | Freq: Every day | ORAL | 1 refills | Status: DC

## 2017-11-25 MED ORDER — SITAGLIPTIN PHOSPHATE 100 MG PO TABS: 100.0000 mg | ORAL_TABLET | Freq: Every day | ORAL | 1 refills | Status: DC

## 2017-11-25 NOTE — Progress Notes (Signed)
Name: Breanna Petty   MRN: [id number]    DOB: Aug 19, 1941   Date:11/25/2017       Progress Note  Subjective  Chief Complaint  Chief Complaint  Patient presents with  . Abdominal Pain    RLQ pain x 2 days ago- started after drinking coffee- got better after stopped drinking the coffee. No pain when urinating, has to "go frequently"  . Diabetes  . Hypertension  . Hyperlipidemia    Abdominal Pain  This is a new problem. The current episode started in the past 7 days (no pain since yesterday). The onset quality is gradual. The problem occurs intermittently. The problem has been waxing and waning. The pain is located in the suprapubic region. The pain is at a severity of 2/10. The pain is mild. The quality of the pain is aching. The abdominal pain does not radiate. Associated symptoms include frequency. Pertinent negatives include no constipation, diarrhea, dysuria, fever, headaches, hematuria, melena, myalgias, nausea, vomiting or weight loss. Exacerbated by: coffee. She has tried nothing for the symptoms. The treatment provided mild relief. Her past medical history is significant for GERD.  Diabetes  She presents for her follow-up diabetic visit. She has type 2 diabetes mellitus. Her disease course has been stable. There are no hypoglycemic associated symptoms. Pertinent negatives for hypoglycemia include no dizziness, headaches or nervousness/anxiousness. Pertinent negatives for diabetes include no blurred vision, no chest pain, no fatigue, no foot paresthesias, no foot ulcerations, no polydipsia, no polyphagia, no polyuria, no visual change and no weight loss. There are no hypoglycemic complications. Symptoms are stable. There are no diabetic complications. Pertinent negatives for diabetic complications include no CVA, PVD or retinopathy. Current diabetic treatment includes oral agent (monotherapy). She is compliant with treatment most of the time. Her weight is stable. She is following a generally  healthy diet. Her home blood glucose trend is fluctuating minimally. Her breakfast blood glucose range is generally 110-130 mg/dl. An ACE inhibitor/angiotensin II receptor blocker is being taken. She does not see a podiatrist.Eye exam is current.  Hypertension  This is a chronic problem. The current episode started more than 1 year ago. The problem is unchanged. The problem is controlled. Pertinent negatives include no blurred vision, chest pain, headaches, malaise/fatigue, neck pain, palpitations or shortness of breath. There are no associated agents to hypertension. Past treatments include angiotensin blockers and calcium channel blockers. There are no compliance problems.  There is no history of angina, kidney disease, CAD/MI, CVA, heart failure, left ventricular hypertrophy, PVD or retinopathy. There is no history of chronic renal disease, a hypertension causing med or renovascular disease.  Hyperlipidemia  This is a chronic problem. The current episode started more than 1 year ago. The problem is controlled. She has no history of chronic renal disease. Pertinent negatives include no chest pain, focal weakness, myalgias or shortness of breath. Current antihyperlipidemic treatment includes statins. The current treatment provides mild improvement of lipids. There are no compliance problems.   Gastroesophageal Reflux  She complains of abdominal pain and heartburn. She reports no chest pain, no choking, no coughing, no dysphagia, no hoarse voice, no nausea, no sore throat or no wheezing. This is a chronic problem. The problem has been unchanged. The heartburn is of mild intensity. The symptoms are aggravated by certain foods. Pertinent negatives include no fatigue, melena or weight loss. Risk factors include caffeine use.    No problem-specific Assessment & Plan notes found for this encounter.   Past Medical History:  Diagnosis Date  .  Anginal pain ([place])   . Anxiety   . Aortic atherosclerosis ([place])    . Arthritis   . Atherosclerotic heart disease   . Depression   . Diabetes mellitus without complication ([place])   . Diverticulosis of colon   . Dysrhythmia   . GERD (gastroesophageal reflux disease)   . Hiatal hernia   . History of heart artery stent   . History of shingles   . HOH (hard of hearing)   . Hyperlipidemia   . Hypertension   . Myocardial infarction ([place])   . Sarcoidosis   . Vitamin D deficiency     Past Surgical History:  Procedure Laterality Date  . ABDOMINAL HYSTERECTOMY  1979  . APPENDECTOMY    . CARDIAC SURGERY    . CATARACT EXTRACTION W/PHACO Right 10/28/2017   Procedure: CATARACT EXTRACTION PHACO AND INTRAOCULAR LENS PLACEMENT (IOC);  Surgeon: Eulogio Bear, MD;  Location: [facility] ORS;  Service: Ophthalmology;  Laterality: Right;  Lot # [id number] [place]: 00:53.9 AP%: 10.6 CDE: 5.71  . CORONARY ANGIOPLASTY     STENT  . LAPAROTOMY N/A 10/04/2016   Procedure: EXPLORATORY LAPAROTOMY;  Surgeon: Dia Crawford III, MD;  Location: [facility] ORS;  Service: General;  Laterality: N/A;    Family History  Problem Relation Age of Onset  . Diabetes Daughter   . Hypertension Mother   . Diabetes Son     Social History   Socioeconomic History  . Marital status: Single    Spouse name: Not on file  . Number of children: Not on file  . Years of education: Not on file  . Highest education level: Not on file  Social Needs  . Financial resource strain: Not on file  . Food insecurity - worry: Not on file  . Food insecurity - inability: Not on file  . Transportation needs - medical: Not on file  . Transportation needs - non-medical: Not on file  Occupational History  . Not on file  Tobacco Use  . Smoking status: Former Smoker    Packs/day: 2.00    Years: 10.00    Pack years: 20.00    Types: Cigarettes    Last attempt to quit: 11/16/1988    Years since quitting: 29.0  . Smokeless tobacco: Never Used  Substance and Sexual Activity  . Alcohol use: No    Alcohol/week: 0.0 oz   . Drug use: No  . Sexual activity: Not Currently  Other Topics Concern  . Not on file  Social History Narrative  . Not on file    Allergies  Allergen Reactions  . Ace Inhibitors     Other reaction(s): Cough  . Codeine Swelling  . Codeine Sulfate     Other reaction(s): Unknown  . Ezetimibe     Other reaction(s): Muscle Pain  . Other     Other reaction(s): Muscle Pain    Outpatient Medications Prior to Visit  Medication Sig Dispense Refill  . aspirin EC 81 MG tablet Take 1 tablet (81 mg total) by mouth daily. 90 tablet 3  . BAYER CONTOUR NEXT TEST test strip TEST 2 TIMES A DAY 200 each 3  . latanoprost (XALATAN) 0.005 % ophthalmic solution Place 1 drop into both eyes at bedtime.     . montelukast (SINGULAIR) 10 MG tablet Take 1 tablet (10 mg total) by mouth at bedtime. 30 tablet 3  . Multiple Vitamin (MULTIVITAMIN) capsule Take 1 capsule by mouth daily.    Marland Kitchen NITROSTAT 0.4 MG SL tablet DISSOLVE ONE TABLET  UNDER TONGUE AS NEED FOR CHEST PAIN AS DIRECTED 30 tablet 0  . omeprazole (PRILOSEC) 20 MG capsule TAKE 1 CAPSULE BY MOUTH 2 TIMES DAILY. (Patient taking differently: 20 mg daily. TAKE 1 CAPSULE BY MOUTH 2 TIMES DAILY.) 180 capsule 3  . amLODipine (NORVASC) 5 MG tablet TAKE 1 TABLET (5 MG TOTAL) BY MOUTH DAILY. 90 tablet 0  . JANUVIA 100 MG tablet TAKE 1 TABLET (100 MG TOTAL) BY MOUTH DAILY. 90 tablet 0  . losartan (COZAAR) 50 MG tablet TAKE 1 TABLET BY MOUTH DAILY. 90 tablet 0  . pravastatin (PRAVACHOL) 40 MG tablet Take 1 tablet (40 mg total) by mouth daily. (Patient taking differently: Take 40 mg by mouth every evening. ) 90 tablet 1  . nortriptyline (PAMELOR) 10 MG capsule Take 1 capsule (10 mg total) by mouth at bedtime. (Patient not taking: Reported on 11/25/2017) 90 capsule 1   No facility-administered medications prior to visit.     Review of Systems  Constitutional: Negative for chills, fatigue, fever, malaise/fatigue and weight loss.  HENT: Negative for ear  discharge, ear pain, hoarse voice and sore throat.   Eyes: Negative for blurred vision.  Respiratory: Negative for cough, sputum production, choking, shortness of breath and wheezing.   Cardiovascular: Negative for chest pain, palpitations and leg swelling.  Gastrointestinal: Positive for abdominal pain and heartburn. Negative for blood in stool, constipation, diarrhea, dysphagia, melena, nausea and vomiting.  Genitourinary: Positive for frequency. Negative for dysuria, hematuria and urgency.  Musculoskeletal: Negative for back pain, joint pain, myalgias and neck pain.  Skin: Negative for rash.  Neurological: Negative for dizziness, tingling, sensory change, focal weakness and headaches.  Endo/Heme/Allergies: Negative for environmental allergies, polydipsia and polyphagia. Does not bruise/bleed easily.  Psychiatric/Behavioral: Negative for depression and suicidal ideas. The patient is not nervous/anxious and does not have insomnia.      Objective  Vitals:   11/25/17 0829  BP: 120/70  Pulse: 80  Weight: 158 lb (71.7 kg)  Height: 5\' 11"  (1.803 m)    Physical Exam  Constitutional: She is well-developed, well-nourished, and in no distress. No distress.  HENT:  Head: Normocephalic and atraumatic.  Right Ear: External ear normal.  Left Ear: External ear normal.  Nose: Nose normal.  Mouth/Throat: Oropharynx is clear and moist. No oropharyngeal exudate, posterior oropharyngeal edema or posterior oropharyngeal erythema.  Eyes: Conjunctivae and EOM are normal. Pupils are equal, round, and reactive to light. Right eye exhibits no discharge. Left eye exhibits no discharge.  Fundoscopic exam:      The right eye shows no arteriolar narrowing and no hemorrhage.       The left eye shows no arteriolar narrowing and no hemorrhage.  Cataract surg 11/03/17  Neck: Normal range of motion. Neck supple. No JVD present. No thyromegaly present.  Cardiovascular: Normal rate, regular rhythm, S1 normal, S2  normal, normal heart sounds and intact distal pulses. Exam reveals no gallop, no S3, no S4 and no friction rub.  No murmur heard. Pulmonary/Chest: Effort normal and breath sounds normal. She has no wheezes. She has no rales.  Abdominal: Soft. Bowel sounds are normal. She exhibits no mass. There is no hepatosplenomegaly. There is no tenderness. There is no guarding and no CVA tenderness.  Musculoskeletal: Normal range of motion. She exhibits no edema.  Lymphadenopathy:       Head (right side): No submandibular adenopathy present.       Head (left side): No submandibular adenopathy present.    She has no cervical  adenopathy.  Neurological: She is alert. She has normal sensation, normal strength and normal reflexes.  Monofilament normal  Skin: Skin is warm and dry. She is not diaphoretic.  Psychiatric: Mood and affect normal.  Nursing note and vitals reviewed.     Assessment & Plan  Problem List Items Addressed This Visit      Cardiovascular and Mediastinum   Arteriosclerosis of coronary artery   Relevant Medications   amLODipine (NORVASC) 5 MG tablet   losartan (COZAAR) 50 MG tablet   pravastatin (PRAVACHOL) 40 MG tablet   Hypertension, benign   Relevant Medications   amLODipine (NORVASC) 5 MG tablet   losartan (COZAAR) 50 MG tablet   pravastatin (PRAVACHOL) 40 MG tablet     Respiratory   Chronic obstructive pulmonary disease (HCC)     Endocrine   Uncontrolled type 2 diabetes mellitus without complication, without long-term current use of insulin (HCC)   Relevant Medications   sitaGLIPtin (JANUVIA) 100 MG tablet   losartan (COZAAR) 50 MG tablet   pravastatin (PRAVACHOL) 40 MG tablet   Other Relevant Orders   HgB A1c   POCT urinalysis dipstick (Completed)     Other   Dyslipidemia   Relevant Medications   pravastatin (PRAVACHOL) 40 MG tablet   Other Relevant Orders   Lipid Panel With LDL/HDL Ratio    Other Visit Diagnoses    Abdominal pain, unspecified abdominal  location    -  Primary   Relevant Orders   Hepatic function panel   CBC w/Diff/Platelet      Meds ordered this encounter  Medications  . amLODipine (NORVASC) 5 MG tablet    Sig: Take 1 tablet (5 mg total) by mouth daily.    Dispense:  90 tablet    Refill:  1  . sitaGLIPtin (JANUVIA) 100 MG tablet    Sig: Take 1 tablet (100 mg total) by mouth daily.    Dispense:  90 tablet    Refill:  1  . losartan (COZAAR) 50 MG tablet    Sig: Take 1 tablet (50 mg total) by mouth daily.    Dispense:  90 tablet    Refill:  1    Pt has been taking with no issues  . pravastatin (PRAVACHOL) 40 MG tablet    Sig: Take 1 tablet (40 mg total) by mouth daily.    Dispense:  90 tablet    Refill:  1      Dr. Otilio Miu [facility]  11/25/17

## 2017-11-26 LAB — HEPATIC FUNCTION PANEL
ALBUMIN: 4.3 g/dL (ref 3.5–4.8)
ALT: 16 IU/L (ref 0–32)
AST: 18 IU/L (ref 0–40)
Alkaline Phosphatase: 64 IU/L (ref 39–117)
BILIRUBIN, DIRECT: 0.13 mg/dL (ref 0.00–0.40)
Bilirubin Total: 0.4 mg/dL (ref 0.0–1.2)
TOTAL PROTEIN: 6.8 g/dL (ref 6.0–8.5)

## 2017-11-26 LAB — CBC WITH DIFFERENTIAL/PLATELET
BASOS ABS: 0 10*3/uL (ref 0.0–0.2)
Basos: 0 %
EOS (ABSOLUTE): 0.1 10*3/uL (ref 0.0–0.4)
EOS: 1 %
HEMATOCRIT: 34.7 % (ref 34.0–46.6)
HEMOGLOBIN: 11.1 g/dL (ref 11.1–15.9)
IMMATURE GRANS (ABS): 0 10*3/uL (ref 0.0–0.1)
Immature Granulocytes: 0 %
LYMPHS: 47 %
Lymphocytes Absolute: 2.9 10*3/uL (ref 0.7–3.1)
MCH: 29.7 pg (ref 26.6–33.0)
MCHC: 32 g/dL (ref 31.5–35.7)
MCV: 93 fL (ref 79–97)
MONOCYTES: 8 %
Monocytes Absolute: 0.5 10*3/uL (ref 0.1–0.9)
Neutrophils Absolute: 2.8 10*3/uL (ref 1.4–7.0)
Neutrophils: 44 %
Platelets: 241 10*3/uL (ref 150–379)
RBC: 3.74 x10E6/uL — ABNORMAL LOW (ref 3.77–5.28)
RDW: 14.6 % (ref 12.3–15.4)
WBC: 6.2 10*3/uL (ref 3.4–10.8)

## 2017-11-26 LAB — LIPID PANEL WITH LDL/HDL RATIO
Cholesterol, Total: 208 mg/dL — ABNORMAL HIGH (ref 100–199)
HDL: 82 mg/dL (ref >39)
LDL CALC: 112 mg/dL — AB (ref 0–99)
LDL/HDL RATIO: 1.4 ratio (ref 0.0–3.2)
TRIGLYCERIDES: 69 mg/dL (ref 0–149)
VLDL Cholesterol Cal: 14 mg/dL (ref 5–40)

## 2017-11-26 LAB — HEMOGLOBIN A1C
Est. average glucose Bld gHb Est-mCnc: 143 mg/dL
Hgb A1c MFr Bld: 6.6 % — ABNORMAL HIGH (ref 4.8–5.6)

## 2017-12-27 ENCOUNTER — Ambulatory Visit
Admission: RE | Admit: 2017-12-27 | Discharge: 2017-12-27 | Disposition: A | Discharge status: Home / Self Care | Payer: 59 | Source: Ambulatory Visit | Attending: Family Medicine | Admitting: Family Medicine

## 2017-12-27 DIAGNOSIS — Z1239 Encounter for other screening for malignant neoplasm of breast: Secondary | ICD-10-CM

## 2017-12-27 DIAGNOSIS — Z1231 Encounter for screening mammogram for malignant neoplasm of breast: Secondary | ICD-10-CM | POA: Diagnosis not present

## 2018-01-10 DIAGNOSIS — H6123 Impacted cerumen, bilateral: Secondary | ICD-10-CM | POA: Diagnosis not present

## 2018-01-10 DIAGNOSIS — H903 Sensorineural hearing loss, bilateral: Secondary | ICD-10-CM | POA: Diagnosis not present

## 2018-01-12 DIAGNOSIS — M79672 Pain in left foot: Secondary | ICD-10-CM | POA: Diagnosis not present

## 2018-01-12 DIAGNOSIS — M79671 Pain in right foot: Secondary | ICD-10-CM | POA: Diagnosis not present

## 2018-01-12 DIAGNOSIS — L851 Acquired keratosis [keratoderma] palmaris et plantaris: Secondary | ICD-10-CM | POA: Diagnosis not present

## 2018-01-12 DIAGNOSIS — B351 Tinea unguium: Secondary | ICD-10-CM | POA: Diagnosis not present

## 2018-01-27 DIAGNOSIS — I25118 Atherosclerotic heart disease of native coronary artery with other forms of angina pectoris: Secondary | ICD-10-CM | POA: Diagnosis not present

## 2018-01-27 DIAGNOSIS — E782 Mixed hyperlipidemia: Secondary | ICD-10-CM | POA: Diagnosis not present

## 2018-01-27 DIAGNOSIS — I10 Essential (primary) hypertension: Secondary | ICD-10-CM | POA: Diagnosis not present

## 2018-02-11 ENCOUNTER — Ambulatory Visit (INDEPENDENT_AMBULATORY_CARE_PROVIDER_SITE_OTHER): Payer: 59 | Admitting: Family Medicine

## 2018-02-11 ENCOUNTER — Ambulatory Visit
Admission: RE | Admit: 2018-02-11 | Discharge: 2018-02-11 | Disposition: A | Discharge status: Home / Self Care | Payer: 59 | Source: Ambulatory Visit | Attending: Family Medicine | Admitting: Family Medicine

## 2018-02-11 ENCOUNTER — Encounter: Payer: Self-pay | Admitting: Family Medicine

## 2018-02-11 VITALS — BP 120/64 | HR 80 | Ht 71.0 in | Wt 164.0 lb

## 2018-02-11 DIAGNOSIS — M47816 Spondylosis without myelopathy or radiculopathy, lumbar region: Secondary | ICD-10-CM | POA: Diagnosis not present

## 2018-02-11 DIAGNOSIS — M4316 Spondylolisthesis, lumbar region: Secondary | ICD-10-CM | POA: Diagnosis not present

## 2018-02-11 DIAGNOSIS — I700 Atherosclerosis of aorta: Secondary | ICD-10-CM | POA: Diagnosis not present

## 2018-02-11 DIAGNOSIS — M5136 Other intervertebral disc degeneration, lumbar region: Secondary | ICD-10-CM | POA: Diagnosis not present

## 2018-02-11 DIAGNOSIS — M545 Low back pain: Secondary | ICD-10-CM | POA: Diagnosis not present

## 2018-02-11 DIAGNOSIS — I2510 Atherosclerotic heart disease of native coronary artery without angina pectoris: Secondary | ICD-10-CM

## 2018-02-11 DIAGNOSIS — M51369 Other intervertebral disc degeneration, lumbar region without mention of lumbar back pain or lower extremity pain: Secondary | ICD-10-CM

## 2018-02-11 MED ORDER — CYCLOBENZAPRINE HCL 5 MG PO TABS
5.0000 mg | ORAL_TABLET | Freq: Every day | ORAL | 1 refills | Status: DC
Start: 2018-02-11 — End: 2018-03-02

## 2018-02-11 MED ORDER — MELOXICAM 15 MG PO TABS: 15.0000 mg | ORAL_TABLET | Freq: Every day | ORAL | 0 refills | Status: DC

## 2018-02-11 NOTE — Progress Notes (Signed)
Name: Breanna Petty   MRN: [id number]    DOB: 1940/12/25   Date:02/11/2018       Progress Note  Subjective  Chief Complaint  Chief Complaint  Patient presents with  . Back Pain    hurting in lower back, no trouble or pain urinating, but "going alot at night"    Back Pain  This is a new problem. The current episode started in the past 7 days (Sunday). The problem occurs intermittently. The problem has been waxing and waning (original apperance 1 year) since onset. The pain is present in the lumbar spine. The quality of the pain is described as aching. Radiates to: radiates around torso. The pain is at a severity of 7/10. The pain is moderate. The pain is worse during the night. The symptoms are aggravated by standing (esp in morning "when I get up"). Stiffness is present in the morning. Associated symptoms include headaches. Pertinent negatives include no abdominal pain, bladder incontinence, bowel incontinence, chest pain, dysuria, fever, leg pain, numbness, paresis, paresthesias, pelvic pain, perianal numbness, tingling, weakness or weight loss. Risk factors include menopause. She has tried nothing for the symptoms.    No problem-specific Assessment & Plan notes found for this encounter.   Past Medical History:  Diagnosis Date  . Anginal pain ([place])   . Anxiety   . Aortic atherosclerosis ([place])   . Arthritis   . Atherosclerotic heart disease   . Depression   . Diabetes mellitus without complication ([place])   . Diverticulosis of colon   . Dysrhythmia   . GERD (gastroesophageal reflux disease)   . Hiatal hernia   . History of heart artery stent   . History of shingles   . HOH (hard of hearing)   . Hyperlipidemia   . Hypertension   . Myocardial infarction ([place])   . Sarcoidosis   . Vitamin D deficiency     Past Surgical History:  Procedure Laterality Date  . ABDOMINAL HYSTERECTOMY  1979  . APPENDECTOMY    . CARDIAC SURGERY    . CATARACT EXTRACTION W/PHACO Right 10/28/2017   Procedure: CATARACT EXTRACTION PHACO AND INTRAOCULAR LENS PLACEMENT (IOC);  Surgeon: Eulogio Bear, MD;  Location: [facility] ORS;  Service: Ophthalmology;  Laterality: Right;  Lot # [id number] H [place]: 00:53.9 AP%: 10.6 CDE: 5.71  . CORONARY ANGIOPLASTY     STENT  . LAPAROTOMY N/A 10/04/2016   Procedure: EXPLORATORY LAPAROTOMY;  Surgeon: Dia Crawford III, MD;  Location: [facility] ORS;  Service: General;  Laterality: N/A;    Family History  Problem Relation Age of Onset  . Diabetes Daughter   . Hypertension Mother   . Diabetes Son   . Breast cancer Neg Hx     Social History   Socioeconomic History  . Marital status: Single    Spouse name: Not on file  . Number of children: Not on file  . Years of education: Not on file  . Highest education level: Not on file  Occupational History  . Not on file  Social Needs  . Financial resource strain: Not on file  . Food insecurity:    Worry: Not on file    Inability: Not on file  . Transportation needs:    Medical: Not on file    Non-medical: Not on file  Tobacco Use  . Smoking status: Former Smoker    Packs/day: 2.00    Years: 10.00    Pack years: 20.00    Types: Cigarettes    Last attempt to  quit: 11/16/1988    Years since quitting: 29.2  . Smokeless tobacco: Never Used  Substance and Sexual Activity  . Alcohol use: No    Alcohol/week: 0.0 oz  . Drug use: No  . Sexual activity: Not Currently  Lifestyle  . Physical activity:    Days per week: Not on file    Minutes per session: Not on file  . Stress: Not on file  Relationships  . Social connections:    Talks on phone: Not on file    Gets together: Not on file    Attends religious service: Not on file    Active member of club or organization: Not on file    Attends meetings of clubs or organizations: Not on file    Relationship status: Not on file  . Intimate partner violence:    Fear of current or ex partner: Not on file    Emotionally abused: Not on file    Physically abused: Not  on file    Forced sexual activity: Not on file  Other Topics Concern  . Not on file  Social History Narrative  . Not on file    Allergies  Allergen Reactions  . Ace Inhibitors     Other reaction(s): Cough  . Codeine Swelling  . Codeine Sulfate     Other reaction(s): Unknown  . Ezetimibe     Other reaction(s): Muscle Pain  . Other     Other reaction(s): Muscle Pain    Outpatient Medications Prior to Visit  Medication Sig Dispense Refill  . amLODipine (NORVASC) 5 MG tablet Take 1 tablet (5 mg total) by mouth daily. 90 tablet 1  . aspirin EC 81 MG tablet Take 1 tablet (81 mg total) by mouth daily. 90 tablet 3  . BAYER CONTOUR NEXT TEST test strip TEST 2 TIMES A DAY 200 each 3  . latanoprost (XALATAN) 0.005 % ophthalmic solution Place 1 drop into both eyes at bedtime.     Marland Kitchen losartan (COZAAR) 50 MG tablet Take 1 tablet (50 mg total) by mouth daily. 90 tablet 1  . montelukast (SINGULAIR) 10 MG tablet Take 1 tablet (10 mg total) by mouth at bedtime. 30 tablet 3  . Multiple Vitamin (MULTIVITAMIN) capsule Take 1 capsule by mouth daily.    Marland Kitchen NITROSTAT 0.4 MG SL tablet DISSOLVE ONE TABLET UNDER TONGUE AS NEED FOR CHEST PAIN AS DIRECTED 30 tablet 0  . nortriptyline (PAMELOR) 10 MG capsule Take 1 capsule (10 mg total) by mouth at bedtime. 90 capsule 1  . omeprazole (PRILOSEC) 20 MG capsule TAKE 1 CAPSULE BY MOUTH 2 TIMES DAILY. (Patient taking differently: 20 mg daily. TAKE 1 CAPSULE BY MOUTH 2 TIMES DAILY.) 180 capsule 3  . pravastatin (PRAVACHOL) 40 MG tablet Take 1 tablet (40 mg total) by mouth daily. 90 tablet 1  . sitaGLIPtin (JANUVIA) 100 MG tablet Take 1 tablet (100 mg total) by mouth daily. 90 tablet 1   No facility-administered medications prior to visit.     Review of Systems  Constitutional: Negative for chills, fever, malaise/fatigue and weight loss.  HENT: Negative for ear discharge, ear pain and sore throat.   Eyes: Negative for blurred vision.  Respiratory: Negative for  cough, sputum production, shortness of breath and wheezing.   Cardiovascular: Negative for chest pain, palpitations and leg swelling.  Gastrointestinal: Negative for abdominal pain, blood in stool, bowel incontinence, constipation, diarrhea, heartburn, melena and nausea.  Genitourinary: Negative for bladder incontinence, dysuria, frequency, hematuria, pelvic pain and urgency.  Musculoskeletal: Positive for back pain. Negative for joint pain, myalgias and neck pain.  Skin: Negative for rash.  Neurological: Positive for headaches. Negative for dizziness, tingling, sensory change, focal weakness, weakness, numbness and paresthesias.  Endo/Heme/Allergies: Negative for environmental allergies and polydipsia. Does not bruise/bleed easily.  Psychiatric/Behavioral: Negative for depression and suicidal ideas. The patient is not nervous/anxious and does not have insomnia.      Objective  Vitals:   02/11/18 0906  BP: 120/64  Pulse: 80  Weight: 164 lb (74.4 kg)  Height: 5\' 11"  (1.803 m)    Physical Exam  Constitutional: She is well-developed, well-nourished, and in no distress. No distress.  HENT:  Head: Normocephalic and atraumatic.  Right Ear: External ear normal.  Left Ear: External ear normal.  Nose: Nose normal.  Mouth/Throat: Oropharynx is clear and moist.  Eyes: Pupils are equal, round, and reactive to light. Conjunctivae and EOM are normal. Right eye exhibits no discharge. Left eye exhibits no discharge.  Neck: Normal range of motion. Neck supple. No JVD present. No thyromegaly present.  Cardiovascular: Normal rate, regular rhythm, normal heart sounds and intact distal pulses. Exam reveals no gallop and no friction rub.  No murmur heard. Pulmonary/Chest: Effort normal and breath sounds normal. She has no wheezes. She has no rales.  Abdominal: Soft. Bowel sounds are normal. She exhibits no mass. There is no tenderness. There is no guarding.  Musculoskeletal: She exhibits no edema.        Lumbar back: She exhibits decreased range of motion, tenderness and spasm.  Lymphadenopathy:    She has no cervical adenopathy.  Neurological: She is alert. She has normal sensation, normal strength, normal reflexes and intact cranial nerves.  Skin: Skin is warm and dry. She is not diaphoretic.  Psychiatric: Mood and affect normal.  Nursing note and vitals reviewed.     Assessment & Plan  Problem List Items Addressed This Visit      Cardiovascular and Mediastinum   Aortic atherosclerosis ([place])     Musculoskeletal and Integument   DDD (degenerative disc disease), lumbar - Primary   Relevant Medications   cyclobenzaprine (FLEXERIL) 5 MG tablet   meloxicam (MOBIC) 15 MG tablet   Other Relevant Orders   DG Lumbar Spine Complete (Completed)      Meds ordered this encounter  Medications  . cyclobenzaprine (FLEXERIL) 5 MG tablet    Sig: Take 1 tablet (5 mg total) by mouth at bedtime.    Dispense:  30 tablet    Refill:  1  . meloxicam (MOBIC) 15 MG tablet    Sig: Take 1 tablet (15 mg total) by mouth daily.    Dispense:  30 tablet    Refill:  0      Dr. Deanna Jones [facility]  02/11/18

## 2018-02-14 DIAGNOSIS — I25118 Atherosclerotic heart disease of native coronary artery with other forms of angina pectoris: Secondary | ICD-10-CM | POA: Diagnosis not present

## 2018-02-15 ENCOUNTER — Ambulatory Visit (INDEPENDENT_AMBULATORY_CARE_PROVIDER_SITE_OTHER): Payer: Self-pay | Admitting: Family Medicine

## 2018-02-15 ENCOUNTER — Encounter: Payer: Self-pay | Admitting: Family Medicine

## 2018-02-15 VITALS — BP 140/78 | HR 75 | Temp 98.3°F | Wt 164.0 lb

## 2018-02-15 DIAGNOSIS — H401132 Primary open-angle glaucoma, bilateral, moderate stage: Secondary | ICD-10-CM | POA: Diagnosis not present

## 2018-02-15 DIAGNOSIS — M545 Low back pain, unspecified: Secondary | ICD-10-CM

## 2018-02-15 MED ORDER — LIDOCAINE 5 % EX PTCH: 1.0000 | MEDICATED_PATCH | CUTANEOUS | 0 refills | Status: DC

## 2018-02-15 MED ORDER — CAPSAICIN-MENTHOL-METHYL SAL 0.025-1-12 % EX CREA: 1.0000 "application " | TOPICAL_CREAM | Freq: Two times a day (BID) | CUTANEOUS | 0 refills | Status: AC

## 2018-02-15 NOTE — Progress Notes (Signed)
Breanna Petty is a 77 y.o. female presents with back pain for the last few days. She has seen her PCP who has developed a treatment plan and performed imaging studies. The patient reports that the medication provided is helping with the pain a little. She was encouraged to come see care by her supervisor after stating she was experiencing back pain. Today she is ambulatory in NAD on exam. She denies any additional or associated symptoms today.    Review of Systems  Constitutional: Negative.   HENT: Negative.   Eyes: Negative.   Respiratory: Negative.   Cardiovascular: Negative.   Gastrointestinal: Negative.   Genitourinary: Negative.   Musculoskeletal: Positive for back pain.  Skin: Negative.   Neurological: Negative.   Endo/Heme/Allergies: Negative.   Psychiatric/Behavioral: Negative.       O: Vitals:   02/15/18 1316  BP: 140/78  Pulse: 75  Temp: 98.3 F (36.8 C)  SpO2: 98%   Physical Exam  Constitutional: She is oriented to person, place, and time. She appears well-developed and well-nourished.  HENT:  Head: Normocephalic.  Eyes: Pupils are equal, round, and reactive to light.  Neck: Normal range of motion. Neck supple.  Cardiovascular: Normal rate.  Pulmonary/Chest: Effort normal and breath sounds normal.  Abdominal: Soft. Bowel sounds are normal.  Musculoskeletal: Normal range of motion.       Lumbar back: She exhibits pain. She exhibits normal range of motion, no tenderness, no bony tenderness, no swelling, no deformity and no spasm.  Neurological: She is alert and oriented to person, place, and time.  Skin: Skin is warm and dry.   A: 1. Acute low back pain without sciatica, unspecified back pain laterality    P: PLAN< Keep follow up appointment with PCP Trial topical medication options along with oral medications provided by PCP [place] area where medication is placed clean after each use Be careful not to get cream in your eyes or mouth Trial chair yoga or  provided light stretching exercises a few times weekly to increase flexibility and decrease pain  1. Acute low back pain without sciatica, unspecified back pain laterality

## 2018-02-15 NOTE — Patient Instructions (Addendum)
PLAN< Keep follow up appointment with PCP Trial topical medication options along with oral medications provided by PCP Wipe area where medication is placed clean after each use Be careful not to get cream in your eyes or mouth Trial chair yoga or provided light stretching exercises a few times weekly to increase flexibility and decrease pain  Back Pain, Adult Back pain is very common. The pain often gets better over time. The cause of back pain is usually not dangerous. Most people can learn to manage their back pain on their own. Follow these instructions at home: Watch your back pain for any changes. The following actions may help to lessen any pain you are feeling:  Stay active. Start with short walks on flat ground if you can. Try to walk farther each day.  Exercise regularly as told by your doctor. Exercise helps your back heal faster. It also helps avoid future injury by keeping your muscles strong and flexible.  Do not sit, drive, or stand in one place for more than 30 minutes.  Do not stay in bed. Resting more than 1-2 days can slow down your recovery.  Be careful when you bend or lift an object. Use good form when lifting: ? Bend at your knees. ? Keep the object close to your body. ? Do not twist.  Sleep on a firm mattress. Lie on your side, and bend your knees. If you lie on your back, put a pillow under your knees.  Take medicines only as told by your doctor.  Put ice on the injured area. ? Put ice in a plastic bag. ? Place a towel between your skin and the bag. ? Leave the ice on for 20 minutes, 2-3 times a day for the first 2-3 days. After that, you can switch between ice and heat packs.  Avoid feeling anxious or stressed. Find good ways to deal with stress, such as exercise.  Maintain a healthy weight. Extra weight puts stress on your back.  Contact a doctor if:  You have pain that does not go away with rest or medicine.  You have worsening pain that goes down  into your legs or buttocks.  You have pain that does not get better in one week.  You have pain at night.  You lose weight.  You have a fever or chills. Get help right away if:  You cannot control when you poop (bowel movement) or pee (urinate).  Your arms or legs feel weak.  Your arms or legs lose feeling (numbness).  You feel sick to your stomach (nauseous) or throw up (vomit).  You have belly (abdominal) pain.  You feel like you may pass out (faint). This information is not intended to replace advice given to you by your health care provider. Make sure you discuss any questions you have with your health care provider. Document Released: 04/20/2008 Document Revised: 04/09/2016 Document Reviewed: 03/06/2014 Elsevier Interactive Patient Education  2018 [place] Ask your health care provider which exercises are safe for you. Do exercises exactly as told by your health care provider and adjust them as directed. It is normal to feel mild stretching, pulling, tightness, or discomfort as you do these exercises, but you should stop right away if you feel sudden pain or your pain gets worse. Do not begin these exercises until told by your health care provider. Stretching and range of motion exercises These exercises warm up your muscles and joints and improve the movement  and flexibility of your back. These exercises also help to relieve pain, numbness, and tingling. Exercise A: Lumbar rotation  1. Lie on your back on a firm surface and bend your knees. 2. Straighten your arms out to your sides so each arm forms an "L" shape with a side of your body (a 90 degree angle). 3. Slowly move both of your knees to one side of your body until you feel a stretch in your lower back. Try not to let your shoulders move off of the floor. 4. Hold for __________ seconds. 5. Tense your abdominal muscles and slowly move your knees back to the starting position. 6. Repeat this  exercise on the other side of your body. Repeat __________ times. Complete this exercise __________ times a day. Exercise B: Prone extension on elbows  1. Lie on your abdomen on a firm surface. 2. Prop yourself up on your elbows. 3. Use your arms to help lift your chest up until you feel a gentle stretch in your abdomen and your lower back. ? This will place some of your body weight on your elbows. If this is uncomfortable, try stacking pillows under your chest. ? Your hips should stay down, against the surface that you are lying on. Keep your hip and back muscles relaxed. 4. Hold for __________ seconds. 5. Slowly relax your upper body and return to the starting position. Repeat __________ times. Complete this exercise __________ times a day. Strengthening exercises These exercises build strength and endurance in your back. Endurance is the ability to use your muscles for a long time, even after they get tired. Exercise C: Pelvic tilt 1. Lie on your back on a firm surface. Bend your knees and keep your feet flat. 2. Tense your abdominal muscles. Tip your pelvis up toward the ceiling and flatten your lower back into the floor. ? To help with this exercise, you may place a small towel under your lower back and try to push your back into the towel. 3. Hold for __________ seconds. 4. Let your muscles relax completely before you repeat this exercise. Repeat __________ times. Complete this exercise __________ times a day. Exercise D: Alternating arm and leg raises  1. Get on your hands and knees on a firm surface. If you are on a hard floor, you may want to use padding to cushion your knees, such as an exercise mat. 2. Line up your arms and legs. Your hands should be below your shoulders, and your knees should be below your hips. 3. Lift your left leg behind you. At the same time, raise your right arm and straighten it in front of you. ? Do not lift your leg higher than your hip. ? Do not lift  your arm higher than your shoulder. ? Keep your abdominal and back muscles tight. ? Keep your hips facing the ground. ? Do not arch your back. ? Keep your balance carefully, and do not hold your breath. 4. Hold for __________ seconds. 5. Slowly return to the starting position and repeat with your right leg and your left arm. Repeat __________ times. Complete this exercise __________ times a day. Exercise E: Abdominal set with straight leg raise  1. Lie on your back on a firm surface. 2. Bend one of your knees and keep your other leg straight. 3. Tense your abdominal muscles and lift your straight leg up, 4-6 inches (10-15 cm) off the ground. 4. Keep your abdominal muscles tight and hold for __________ seconds. ? Do  not hold your breath. ? Do not arch your back. Keep it flat against the ground. 5. Keep your abdominal muscles tense as you slowly lower your leg back to the starting position. 6. Repeat with your other leg. Repeat __________ times. Complete this exercise __________ times a day. Posture and body mechanics  Body mechanics refers to the movements and positions of your body while you do your daily activities. Posture is part of body mechanics. Good posture and healthy body mechanics can help to relieve stress in your body's tissues and joints. Good posture means that your spine is in its natural S-curve position (your spine is neutral), your shoulders are pulled back slightly, and your head is not tipped forward. The following are general guidelines for applying improved posture and body mechanics to your everyday activities. Standing   When standing, keep your spine neutral and your feet about hip-width apart. Keep a slight bend in your knees. Your ears, shoulders, and hips should line up.  When you do a task in which you stand in one place for a long time, place one foot up on a stable object that is 2-4 inches (5-10 cm) high, such as a footstool. This helps keep your spine  neutral. Sitting   When sitting, keep your spine neutral and keep your feet flat on the floor. Use a footrest, if necessary, and keep your thighs parallel to the floor. Avoid rounding your shoulders, and avoid tilting your head forward.  When working at a desk or a computer, keep your desk at a height where your hands are slightly lower than your elbows. Slide your chair under your desk so you are close enough to maintain good posture.  When working at a computer, place your monitor at a height where you are looking straight ahead and you do not have to tilt your head forward or downward to look at the screen. Resting   When lying down and resting, avoid positions that are most painful for you.  If you have pain with activities such as sitting, bending, stooping, or squatting (flexion-based activities), lie in a position in which your body does not bend very much. For example, avoid curling up on your side with your arms and knees near your chest (fetal position).  If you have pain with activities such as standing for a long time or reaching with your arms (extension-based activities), lie with your spine in a neutral position and bend your knees slightly. Try the following positions:  Lying on your side with a pillow between your knees.  Lying on your back with a pillow under your knees. Lifting   When lifting objects, keep your feet at least shoulder-width apart and tighten your abdominal muscles.  Bend your knees and hips and keep your spine neutral. It is important to lift using the strength of your legs, not your back. Do not lock your knees straight out.  Always ask for help to lift heavy or awkward objects. This information is not intended to replace advice given to you by your health care provider. Make sure you discuss any questions you have with your health care provider. Document Released: 11/02/2005 Document Revised: 07/09/2016 Document Reviewed: 08/14/2015 Elsevier  Interactive Patient Education  [employer].

## 2018-02-22 DIAGNOSIS — M2041 Other hammer toe(s) (acquired), right foot: Secondary | ICD-10-CM | POA: Diagnosis not present

## 2018-02-22 DIAGNOSIS — E119 Type 2 diabetes mellitus without complications: Secondary | ICD-10-CM | POA: Diagnosis not present

## 2018-02-23 DIAGNOSIS — M9905 Segmental and somatic dysfunction of pelvic region: Secondary | ICD-10-CM | POA: Diagnosis not present

## 2018-02-23 DIAGNOSIS — M9902 Segmental and somatic dysfunction of thoracic region: Secondary | ICD-10-CM | POA: Diagnosis not present

## 2018-02-23 DIAGNOSIS — M546 Pain in thoracic spine: Secondary | ICD-10-CM | POA: Diagnosis not present

## 2018-02-23 DIAGNOSIS — I2510 Atherosclerotic heart disease of native coronary artery without angina pectoris: Secondary | ICD-10-CM | POA: Diagnosis not present

## 2018-02-23 DIAGNOSIS — M62830 Muscle spasm of back: Secondary | ICD-10-CM | POA: Diagnosis not present

## 2018-02-23 DIAGNOSIS — M461 Sacroiliitis, not elsewhere classified: Secondary | ICD-10-CM | POA: Diagnosis not present

## 2018-02-23 DIAGNOSIS — M9903 Segmental and somatic dysfunction of lumbar region: Secondary | ICD-10-CM | POA: Diagnosis not present

## 2018-02-23 DIAGNOSIS — M6080 Other myositis, unspecified site: Secondary | ICD-10-CM | POA: Diagnosis not present

## 2018-02-23 DIAGNOSIS — M5442 Lumbago with sciatica, left side: Secondary | ICD-10-CM | POA: Diagnosis not present

## 2018-02-23 DIAGNOSIS — I10 Essential (primary) hypertension: Secondary | ICD-10-CM | POA: Diagnosis not present

## 2018-02-23 DIAGNOSIS — M955 Acquired deformity of pelvis: Secondary | ICD-10-CM | POA: Diagnosis not present

## 2018-02-23 DIAGNOSIS — E782 Mixed hyperlipidemia: Secondary | ICD-10-CM | POA: Diagnosis not present

## 2018-02-28 DIAGNOSIS — M6080 Other myositis, unspecified site: Secondary | ICD-10-CM | POA: Diagnosis not present

## 2018-02-28 DIAGNOSIS — M5442 Lumbago with sciatica, left side: Secondary | ICD-10-CM | POA: Diagnosis not present

## 2018-02-28 DIAGNOSIS — M62830 Muscle spasm of back: Secondary | ICD-10-CM | POA: Diagnosis not present

## 2018-02-28 DIAGNOSIS — M9903 Segmental and somatic dysfunction of lumbar region: Secondary | ICD-10-CM | POA: Diagnosis not present

## 2018-02-28 DIAGNOSIS — M9902 Segmental and somatic dysfunction of thoracic region: Secondary | ICD-10-CM | POA: Diagnosis not present

## 2018-02-28 DIAGNOSIS — M955 Acquired deformity of pelvis: Secondary | ICD-10-CM | POA: Diagnosis not present

## 2018-02-28 DIAGNOSIS — M461 Sacroiliitis, not elsewhere classified: Secondary | ICD-10-CM | POA: Diagnosis not present

## 2018-02-28 DIAGNOSIS — M9905 Segmental and somatic dysfunction of pelvic region: Secondary | ICD-10-CM | POA: Diagnosis not present

## 2018-02-28 DIAGNOSIS — M546 Pain in thoracic spine: Secondary | ICD-10-CM | POA: Diagnosis not present

## 2018-03-02 ENCOUNTER — Ambulatory Visit (INDEPENDENT_AMBULATORY_CARE_PROVIDER_SITE_OTHER): Payer: 59 | Admitting: Family Medicine

## 2018-03-02 ENCOUNTER — Encounter: Payer: Self-pay | Admitting: Family Medicine

## 2018-03-02 VITALS — BP 138/70 | HR 72 | Ht 71.0 in | Wt 163.0 lb

## 2018-03-02 DIAGNOSIS — I2510 Atherosclerotic heart disease of native coronary artery without angina pectoris: Secondary | ICD-10-CM

## 2018-03-02 DIAGNOSIS — M5136 Other intervertebral disc degeneration, lumbar region: Secondary | ICD-10-CM | POA: Diagnosis not present

## 2018-03-02 MED ORDER — MELOXICAM 15 MG PO TABS: 15.0000 mg | ORAL_TABLET | Freq: Every day | ORAL | 6 refills | Status: DC

## 2018-03-02 MED ORDER — CYCLOBENZAPRINE HCL 5 MG PO TABS: 5.0000 mg | ORAL_TABLET | Freq: Every day | ORAL | 6 refills | Status: DC

## 2018-03-02 NOTE — Progress Notes (Signed)
Name: Breanna Petty   MRN: [id number]    DOB: 1941-10-27   Date:03/02/2018       Progress Note  Subjective  Chief Complaint  Chief Complaint  Patient presents with  . Follow-up    back pain- better on cyclobenzaprine and meloxicam    Back Pain  This is a recurrent problem. The current episode started more than 1 month ago. The problem occurs intermittently. The problem has been gradually improving since onset. The pain is present in the lumbar spine. The quality of the pain is described as aching. The pain does not radiate. The pain is at a severity of 2/10. The pain is mild. The symptoms are aggravated by bending and twisting. Pertinent negatives include no abdominal pain, bladder incontinence, bowel incontinence, chest pain, dysuria, fever, headaches, leg pain, numbness, paresis, paresthesias, pelvic pain, perianal numbness, tingling, weakness or weight loss.    No problem-specific Assessment & Plan notes found for this encounter.   Past Medical History:  Diagnosis Date  . Anginal pain ([place])   . Anxiety   . Aortic atherosclerosis ([place])   . Arthritis   . Atherosclerotic heart disease   . Depression   . Diabetes mellitus without complication ([place])   . Diverticulosis of colon   . Dysrhythmia   . GERD (gastroesophageal reflux disease)   . Hiatal hernia   . History of heart artery stent   . History of shingles   . HOH (hard of hearing)   . Hyperlipidemia   . Hypertension   . Myocardial infarction ([place])   . Sarcoidosis   . Vitamin D deficiency     Past Surgical History:  Procedure Laterality Date  . ABDOMINAL HYSTERECTOMY  1979  . APPENDECTOMY    . CARDIAC SURGERY    . CATARACT EXTRACTION W/PHACO Right 10/28/2017   Procedure: CATARACT EXTRACTION PHACO AND INTRAOCULAR LENS PLACEMENT (IOC);  Surgeon: Eulogio Bear, MD;  Location: [facility] ORS;  Service: Ophthalmology;  Laterality: Right;  Lot # [id number] H [place]: 00:53.9 AP%: 10.6 CDE: 5.71  . CORONARY ANGIOPLASTY     STENT   . LAPAROTOMY N/A 10/04/2016   Procedure: EXPLORATORY LAPAROTOMY;  Surgeon: Dia Crawford III, MD;  Location: [facility] ORS;  Service: General;  Laterality: N/A;    Family History  Problem Relation Age of Onset  . Diabetes Daughter   . Hypertension Mother   . Diabetes Son   . Breast cancer Neg Hx     Social History   Socioeconomic History  . Marital status: Single    Spouse name: Not on file  . Number of children: Not on file  . Years of education: Not on file  . Highest education level: Not on file  Occupational History  . Not on file  Social Needs  . Financial resource strain: Not on file  . Food insecurity:    Worry: Not on file    Inability: Not on file  . Transportation needs:    Medical: Not on file    Non-medical: Not on file  Tobacco Use  . Smoking status: Former Smoker    Packs/day: 2.00    Years: 10.00    Pack years: 20.00    Types: Cigarettes    Last attempt to quit: 11/16/1988    Years since quitting: 29.3  . Smokeless tobacco: Never Used  Substance and Sexual Activity  . Alcohol use: No    Alcohol/week: 0.0 oz  . Drug use: No  . Sexual activity: Not Currently  Lifestyle  .  Physical activity:    Days per week: Not on file    Minutes per session: Not on file  . Stress: Not on file  Relationships  . Social connections:    Talks on phone: Not on file    Gets together: Not on file    Attends religious service: Not on file    Active member of club or organization: Not on file    Attends meetings of clubs or organizations: Not on file    Relationship status: Not on file  . Intimate partner violence:    Fear of current or ex partner: Not on file    Emotionally abused: Not on file    Physically abused: Not on file    Forced sexual activity: Not on file  Other Topics Concern  . Not on file  Social History Narrative  . Not on file    Allergies  Allergen Reactions  . Ace Inhibitors     Other reaction(s): Cough  . Codeine Swelling  . Codeine Sulfate      Other reaction(s): Unknown  . Ezetimibe     Other reaction(s): Muscle Pain  . Other     Other reaction(s): Muscle Pain    Outpatient Medications Prior to Visit  Medication Sig Dispense Refill  . amLODipine (NORVASC) 5 MG tablet Take 1 tablet (5 mg total) by mouth daily. 90 tablet 1  . aspirin EC 81 MG tablet Take 1 tablet (81 mg total) by mouth daily. 90 tablet 3  . BAYER CONTOUR NEXT TEST test strip TEST 2 TIMES A DAY 200 each 3  . latanoprost (XALATAN) 0.005 % ophthalmic solution Place 1 drop into both eyes at bedtime.     . lidocaine (LIDODERM) 5 % Place 1 patch onto the skin daily. Remove & Discard patch within 12 hours or as directed by MD 10 patch 0  . losartan (COZAAR) 50 MG tablet Take 1 tablet (50 mg total) by mouth daily. 90 tablet 1  . montelukast (SINGULAIR) 10 MG tablet Take 1 tablet (10 mg total) by mouth at bedtime. 30 tablet 3  . Multiple Vitamin (MULTIVITAMIN) capsule Take 1 capsule by mouth daily.    Marland Kitchen NITROSTAT 0.4 MG SL tablet DISSOLVE ONE TABLET UNDER TONGUE AS NEED FOR CHEST PAIN AS DIRECTED 30 tablet 0  . omeprazole (PRILOSEC) 20 MG capsule TAKE 1 CAPSULE BY MOUTH 2 TIMES DAILY. (Patient taking differently: 20 mg daily. TAKE 1 CAPSULE BY MOUTH 2 TIMES DAILY.) 180 capsule 3  . pravastatin (PRAVACHOL) 40 MG tablet Take 1 tablet (40 mg total) by mouth daily. 90 tablet 1  . sitaGLIPtin (JANUVIA) 100 MG tablet Take 1 tablet (100 mg total) by mouth daily. 90 tablet 1  . cyclobenzaprine (FLEXERIL) 5 MG tablet Take 1 tablet (5 mg total) by mouth at bedtime. 30 tablet 1  . meloxicam (MOBIC) 15 MG tablet Take 1 tablet (15 mg total) by mouth daily. 30 tablet 0  . nortriptyline (PAMELOR) 10 MG capsule Take 1 capsule (10 mg total) by mouth at bedtime. (Patient not taking: Reported on 02/15/2018) 90 capsule 1   No facility-administered medications prior to visit.     Review of Systems  Constitutional: Negative for chills, fever, malaise/fatigue and weight loss.  HENT: Negative  for ear discharge, ear pain and sore throat.   Eyes: Negative for blurred vision.  Respiratory: Negative for cough, sputum production, shortness of breath and wheezing.   Cardiovascular: Negative for chest pain, palpitations and leg swelling.  Gastrointestinal: Negative  for abdominal pain, blood in stool, bowel incontinence, constipation, diarrhea, heartburn, melena and nausea.  Genitourinary: Negative for bladder incontinence, dysuria, frequency, hematuria, pelvic pain and urgency.  Musculoskeletal: Positive for back pain. Negative for joint pain, myalgias and neck pain.  Skin: Negative for rash.  Neurological: Negative for dizziness, tingling, sensory change, focal weakness, weakness, numbness, headaches and paresthesias.  Endo/Heme/Allergies: Negative for environmental allergies and polydipsia. Does not bruise/bleed easily.  Psychiatric/Behavioral: Negative for depression and suicidal ideas. The patient is not nervous/anxious and does not have insomnia.      Objective  Vitals:   03/02/18 0901  BP: 138/70  Pulse: 72  Weight: 163 lb (73.9 kg)  Height: 5\' 11"  (1.803 m)    Physical Exam  Constitutional: No distress.  HENT:  Head: Normocephalic and atraumatic.  Right Ear: External ear normal.  Left Ear: External ear normal.  Nose: Nose normal.  Mouth/Throat: Oropharynx is clear and moist.  Eyes: Pupils are equal, round, and reactive to light. Conjunctivae and EOM are normal. Right eye exhibits no discharge. Left eye exhibits no discharge.  Neck: Normal range of motion. Neck supple. No JVD present. No thyromegaly present.  Cardiovascular: Normal rate, regular rhythm, normal heart sounds and intact distal pulses. Exam reveals no gallop and no friction rub.  No murmur heard. Pulmonary/Chest: Effort normal and breath sounds normal.  Abdominal: Soft. Bowel sounds are normal. She exhibits no mass. There is no tenderness. There is no guarding.  Musculoskeletal: Normal range of motion.  She exhibits no edema.       Lumbar back: She exhibits normal range of motion, no tenderness, no deformity and no spasm.  Lymphadenopathy:    She has no cervical adenopathy.  Neurological: She is alert. She has normal strength and normal reflexes. No sensory deficit.  Negative SLR  Skin: Skin is warm and dry. She is not diaphoretic.  Nursing note and vitals reviewed.     Assessment & Plan  Problem List Items Addressed This Visit      Musculoskeletal and Integument   DDD (degenerative disc disease), lumbar - Primary   Relevant Medications   meloxicam (MOBIC) 15 MG tablet   cyclobenzaprine (FLEXERIL) 5 MG tablet      Meds ordered this encounter  Medications  . meloxicam (MOBIC) 15 MG tablet    Sig: Take 1 tablet (15 mg total) by mouth daily.    Dispense:  30 tablet    Refill:  6  . cyclobenzaprine (FLEXERIL) 5 MG tablet    Sig: Take 1 tablet (5 mg total) by mouth at bedtime.    Dispense:  30 tablet    Refill:  6      Dr. Otilio Miu [facility]  03/02/18

## 2018-03-21 DIAGNOSIS — M5442 Lumbago with sciatica, left side: Secondary | ICD-10-CM | POA: Diagnosis not present

## 2018-03-21 DIAGNOSIS — M6080 Other myositis, unspecified site: Secondary | ICD-10-CM | POA: Diagnosis not present

## 2018-03-21 DIAGNOSIS — M9903 Segmental and somatic dysfunction of lumbar region: Secondary | ICD-10-CM | POA: Diagnosis not present

## 2018-03-21 DIAGNOSIS — M546 Pain in thoracic spine: Secondary | ICD-10-CM | POA: Diagnosis not present

## 2018-03-21 DIAGNOSIS — M9902 Segmental and somatic dysfunction of thoracic region: Secondary | ICD-10-CM | POA: Diagnosis not present

## 2018-03-21 DIAGNOSIS — M955 Acquired deformity of pelvis: Secondary | ICD-10-CM | POA: Diagnosis not present

## 2018-03-21 DIAGNOSIS — M461 Sacroiliitis, not elsewhere classified: Secondary | ICD-10-CM | POA: Diagnosis not present

## 2018-03-21 DIAGNOSIS — M62830 Muscle spasm of back: Secondary | ICD-10-CM | POA: Diagnosis not present

## 2018-03-21 DIAGNOSIS — M9905 Segmental and somatic dysfunction of pelvic region: Secondary | ICD-10-CM | POA: Diagnosis not present

## 2018-03-23 ENCOUNTER — Encounter: Payer: Self-pay | Admitting: Family Medicine

## 2018-03-23 ENCOUNTER — Ambulatory Visit (INDEPENDENT_AMBULATORY_CARE_PROVIDER_SITE_OTHER): Payer: 59 | Admitting: Family Medicine

## 2018-03-23 ENCOUNTER — Ambulatory Visit: Payer: Self-pay | Admitting: Internal Medicine

## 2018-03-23 VITALS — BP 120/80 | HR 64 | Ht 71.0 in | Wt 160.0 lb

## 2018-03-23 DIAGNOSIS — N3281 Overactive bladder: Secondary | ICD-10-CM | POA: Diagnosis not present

## 2018-03-23 DIAGNOSIS — I700 Atherosclerosis of aorta: Secondary | ICD-10-CM | POA: Diagnosis not present

## 2018-03-23 DIAGNOSIS — Z1272 Encounter for screening for malignant neoplasm of vagina: Secondary | ICD-10-CM | POA: Diagnosis not present

## 2018-03-23 DIAGNOSIS — Z87891 Personal history of nicotine dependence: Secondary | ICD-10-CM | POA: Diagnosis not present

## 2018-03-23 DIAGNOSIS — Z1211 Encounter for screening for malignant neoplasm of colon: Secondary | ICD-10-CM

## 2018-03-23 DIAGNOSIS — Z0000 Encounter for general adult medical examination without abnormal findings: Secondary | ICD-10-CM | POA: Diagnosis not present

## 2018-03-23 DIAGNOSIS — E119 Type 2 diabetes mellitus without complications: Secondary | ICD-10-CM

## 2018-03-23 DIAGNOSIS — I2510 Atherosclerotic heart disease of native coronary artery without angina pectoris: Secondary | ICD-10-CM

## 2018-03-23 LAB — POCT URINALYSIS DIPSTICK
Bilirubin, UA: NEGATIVE
GLUCOSE UA: NEGATIVE
Ketones, UA: NEGATIVE
Nitrite, UA: NEGATIVE
Protein, UA: NEGATIVE
RBC UA: NEGATIVE
Spec Grav, UA: 1.01 (ref 1.010–1.025)
Urobilinogen, UA: 0.2 E.U./dL
pH, UA: 6 (ref 5.0–8.0)

## 2018-03-23 LAB — HEMOCCULT GUIAC POC 1CARD (OFFICE): FECAL OCCULT BLD: NEGATIVE

## 2018-03-23 MED ORDER — TOLTERODINE TARTRATE 2 MG PO TABS: 2.0000 mg | ORAL_TABLET | Freq: Two times a day (BID) | ORAL | 5 refills | Status: DC

## 2018-03-23 NOTE — Progress Notes (Signed)
Name: Breanna Petty   MRN: [id number]    DOB: May 29, 1941   Date:03/23/2018       Progress Note  Subjective  Chief Complaint  Chief Complaint  Patient presents with  . Annual Exam    had mammo in Feb  . Diabetes  . overactive bladder    during night and day- "have to wear a pad"    Patient presents for annual physical exam.  Diabetes  She presents for her follow-up diabetic visit. She has type 2 diabetes mellitus. Her disease course has been stable. There are no hypoglycemic associated symptoms. Pertinent negatives for hypoglycemia include no dizziness, headaches or nervousness/anxiousness. Pertinent negatives for diabetes include no blurred vision, no chest pain, no fatigue, no foot paresthesias, no foot ulcerations, no polydipsia, no polyphagia, no polyuria, no visual change, no weakness and no weight loss. There are no hypoglycemic complications. Symptoms are stable. Pertinent negatives for diabetic complications include no autonomic neuropathy, CVA, heart disease, impotence, nephropathy or peripheral neuropathy. Risk factors for coronary artery disease include diabetes mellitus, dyslipidemia, hypertension and post-menopausal. Current diabetic treatment includes oral agent (monotherapy). She is compliant with treatment all of the time. Her weight is stable. She is following a generally healthy diet. There is no change in her home blood glucose trend. Her breakfast blood glucose is taken between 8-9 am. Her breakfast blood glucose range is generally 140-180 mg/dl. An ACE inhibitor/angiotensin II receptor blocker is not being taken. She does not see a podiatrist.Eye exam is not current.    No problem-specific Assessment & Plan notes found for this encounter.   Past Medical History:  Diagnosis Date  . Anginal pain ([place])   . Anxiety   . Aortic atherosclerosis ([place])   . Arthritis   . Atherosclerotic heart disease   . Depression   . Diabetes mellitus without complication ([place])   .  Diverticulosis of colon   . Dysrhythmia   . GERD (gastroesophageal reflux disease)   . Hiatal hernia   . History of heart artery stent   . History of shingles   . HOH (hard of hearing)   . Hyperlipidemia   . Hypertension   . Myocardial infarction ([place])   . Sarcoidosis   . Vitamin D deficiency     Past Surgical History:  Procedure Laterality Date  . ABDOMINAL HYSTERECTOMY  1979  . APPENDECTOMY    . CARDIAC SURGERY    . CATARACT EXTRACTION W/PHACO Right 10/28/2017   Procedure: CATARACT EXTRACTION PHACO AND INTRAOCULAR LENS PLACEMENT (IOC);  Surgeon: Eulogio Bear, MD;  Location: [facility] ORS;  Service: Ophthalmology;  Laterality: Right;  Lot # [id number] H [place]: 00:53.9 AP%: 10.6 CDE: 5.71  . CORONARY ANGIOPLASTY     STENT  . LAPAROTOMY N/A 10/04/2016   Procedure: EXPLORATORY LAPAROTOMY;  Surgeon: Dia Crawford III, MD;  Location: [facility] ORS;  Service: General;  Laterality: N/A;    Family History  Problem Relation Age of Onset  . Diabetes Daughter   . Hypertension Mother   . Diabetes Son   . Breast cancer Neg Hx     Social History   Socioeconomic History  . Marital status: Single    Spouse name: Not on file  . Number of children: Not on file  . Years of education: Not on file  . Highest education level: Not on file  Occupational History  . Not on file  Social Needs  . Financial resource strain: Not on file  . Food insecurity:    Worry: Not  on file    Inability: Not on file  . Transportation needs:    Medical: Not on file    Non-medical: Not on file  Tobacco Use  . Smoking status: Former Smoker    Packs/day: 2.00    Years: 10.00    Pack years: 20.00    Types: Cigarettes    Last attempt to quit: 11/16/1988    Years since quitting: 29.3  . Smokeless tobacco: Never Used  Substance and Sexual Activity  . Alcohol use: No    Alcohol/week: 0.0 oz  . Drug use: No  . Sexual activity: Not Currently  Lifestyle  . Physical activity:    Days per week: Not on file     Minutes per session: Not on file  . Stress: Not on file  Relationships  . Social connections:    Talks on phone: Not on file    Gets together: Not on file    Attends religious service: Not on file    Active member of club or organization: Not on file    Attends meetings of clubs or organizations: Not on file    Relationship status: Not on file  . Intimate partner violence:    Fear of current or ex partner: Not on file    Emotionally abused: Not on file    Physically abused: Not on file    Forced sexual activity: Not on file  Other Topics Concern  . Not on file  Social History Narrative  . Not on file    Allergies  Allergen Reactions  . Ace Inhibitors     Other reaction(s): Cough  . Codeine Swelling  . Codeine Sulfate     Other reaction(s): Unknown  . Ezetimibe     Other reaction(s): Muscle Pain  . Other     Other reaction(s): Muscle Pain    Outpatient Medications Prior to Visit  Medication Sig Dispense Refill  . amLODipine (NORVASC) 5 MG tablet Take 1 tablet (5 mg total) by mouth daily. 90 tablet 1  . aspirin EC 81 MG tablet Take 1 tablet (81 mg total) by mouth daily. 90 tablet 3  . BAYER CONTOUR NEXT TEST test strip TEST 2 TIMES A DAY 200 each 3  . cyclobenzaprine (FLEXERIL) 5 MG tablet Take 1 tablet (5 mg total) by mouth at bedtime. 30 tablet 6  . latanoprost (XALATAN) 0.005 % ophthalmic solution Place 1 drop into both eyes at bedtime.     . lidocaine (LIDODERM) 5 % Place 1 patch onto the skin daily. Remove & Discard patch within 12 hours or as directed by MD 10 patch 0  . losartan (COZAAR) 50 MG tablet Take 1 tablet (50 mg total) by mouth daily. 90 tablet 1  . meloxicam (MOBIC) 15 MG tablet Take 1 tablet (15 mg total) by mouth daily. 30 tablet 6  . montelukast (SINGULAIR) 10 MG tablet Take 1 tablet (10 mg total) by mouth at bedtime. 30 tablet 3  . Multiple Vitamin (MULTIVITAMIN) capsule Take 1 capsule by mouth daily.    Marland Kitchen NITROSTAT 0.4 MG SL tablet DISSOLVE ONE TABLET  UNDER TONGUE AS NEED FOR CHEST PAIN AS DIRECTED 30 tablet 0  . nortriptyline (PAMELOR) 10 MG capsule Take 1 capsule (10 mg total) by mouth at bedtime. 90 capsule 1  . omeprazole (PRILOSEC) 20 MG capsule TAKE 1 CAPSULE BY MOUTH 2 TIMES DAILY. (Patient taking differently: 20 mg daily. TAKE 1 CAPSULE BY MOUTH 2 TIMES DAILY.) 180 capsule 3  . pravastatin (PRAVACHOL)  40 MG tablet Take 1 tablet (40 mg total) by mouth daily. 90 tablet 1  . sitaGLIPtin (JANUVIA) 100 MG tablet Take 1 tablet (100 mg total) by mouth daily. 90 tablet 1   No facility-administered medications prior to visit.     Review of Systems  Constitutional: Negative for chills, fatigue, fever, malaise/fatigue and weight loss.  HENT: Negative for ear discharge, ear pain and sore throat.   Eyes: Negative for blurred vision.  Respiratory: Negative for cough, sputum production, shortness of breath and wheezing.   Cardiovascular: Negative for chest pain, palpitations and leg swelling.  Gastrointestinal: Negative for abdominal pain, blood in stool, constipation, diarrhea, heartburn, melena and nausea.  Genitourinary: Negative for dysuria, frequency, hematuria, impotence and urgency.  Musculoskeletal: Negative for back pain, joint pain, myalgias and neck pain.  Skin: Negative for rash.  Neurological: Negative for dizziness, tingling, sensory change, focal weakness, weakness and headaches.  Endo/Heme/Allergies: Negative for environmental allergies, polydipsia and polyphagia. Does not bruise/bleed easily.  Psychiatric/Behavioral: Negative for depression and suicidal ideas. The patient is not nervous/anxious and does not have insomnia.      Objective  Vitals:   03/23/18 0834  BP: 120/80  Pulse: 64  Weight: 160 lb (72.6 kg)  Height: 5\' 11"  (1.803 m)    Physical Exam  Constitutional: She is oriented to person, place, and time. Vital signs are normal. She appears well-developed and well-nourished. No distress.  HENT:  Head:  Normocephalic and atraumatic.  Right Ear: Hearing, tympanic membrane, external ear and ear canal normal. No decreased hearing is noted.  Left Ear: Hearing, tympanic membrane, external ear and ear canal normal. No decreased hearing is noted.  Nose: Nose normal.  Mouth/Throat: Oropharynx is clear and moist.  Eyes: Pupils are equal, round, and reactive to light. Conjunctivae, EOM and lids are normal. Right eye exhibits no discharge. Left eye exhibits no discharge. Right eye exhibits normal extraocular motion. Left eye exhibits normal extraocular motion.  Fundoscopic exam:      The right eye shows no arteriolar narrowing and no AV nicking.       The left eye shows no arteriolar narrowing and no AV nicking.  Neck: Trachea normal, normal range of motion and phonation normal. Neck supple. Normal carotid pulses, no hepatojugular reflux and no JVD present. Carotid bruit is not present. No neck rigidity. No edema and no erythema present. No thyroid mass and no thyromegaly present.  Cardiovascular: Normal rate, regular rhythm, S1 normal, S2 normal, normal heart sounds and intact distal pulses. PMI is not displaced. Exam reveals no gallop, no S3, no S4 and no friction rub.  No murmur heard. Pulses:      Carotid pulses are 2+ on the right side, and 2+ on the left side.      Radial pulses are 2+ on the right side, and 2+ on the left side.       Femoral pulses are 2+ on the right side, and 2+ on the left side.      Popliteal pulses are 2+ on the right side, and 2+ on the left side.       Dorsalis pedis pulses are 2+ on the right side, and 2+ on the left side.       Posterior tibial pulses are 2+ on the right side, and 2+ on the left side.  Pulmonary/Chest: Effort normal and breath sounds normal. No stridor. No respiratory distress. She has no decreased breath sounds. She has no wheezes. She has no rhonchi. She has  no rales. She exhibits no mass, no tenderness, no laceration and no edema. Right breast exhibits no  inverted nipple, no mass, no nipple discharge, no skin change and no tenderness. Left breast exhibits no inverted nipple, no mass, no nipple discharge, no skin change and no tenderness. No breast swelling, tenderness, discharge or bleeding. Breasts are symmetrical.  Abdominal: Soft. Normal aorta and bowel sounds are normal. She exhibits no ascites and no mass. There is no hepatosplenomegaly. There is no tenderness. There is no rigidity, no rebound, no guarding and no CVA tenderness.  Genitourinary: Rectum normal and vagina normal. Rectal exam shows no external hemorrhoid, no internal hemorrhoid, no mass, anal tone normal and guaiac negative stool. No breast swelling, tenderness, discharge or bleeding. Pelvic exam was performed with patient supine. There is no rash or tenderness on the right labia. There is no rash or tenderness on the left labia. Right adnexum displays no mass, no tenderness and no fullness. Left adnexum displays no mass, no tenderness and no fullness.  Musculoskeletal: Normal range of motion. She exhibits no edema, tenderness or deformity.       Right shoulder: She exhibits normal range of motion and no deformity.       Cervical back: Normal.       Thoracic back: Normal.       Lumbar back: Normal.  Feet:  Right Foot:  Protective Sensation: 10 sites tested. 10 sites sensed.  Left Foot:  Protective Sensation: 10 sites tested. 10 sites sensed.  Lymphadenopathy:       Head (right side): No submandibular adenopathy present.       Head (left side): No submandibular adenopathy present.    She has no cervical adenopathy.    She has no axillary adenopathy.  Neurological: She is alert and oriented to person, place, and time. She has normal strength and normal reflexes. No cranial nerve deficit or sensory deficit.  Reflex Scores:      Tricep reflexes are 2+ on the right side and 2+ on the left side.      Bicep reflexes are 2+ on the right side and 2+ on the left side.       Brachioradialis reflexes are 2+ on the right side and 2+ on the left side.      Patellar reflexes are 2+ on the right side and 2+ on the left side.      Achilles reflexes are 2+ on the right side and 2+ on the left side. Skin: Skin is warm, dry and intact. She is not diaphoretic. No pallor.  Psychiatric: She has a normal mood and affect. Her speech is normal and behavior is normal. Judgment and thought content normal. Cognition and memory are normal.  Nursing note and vitals reviewed.     Assessment & Plan  Problem List Items Addressed This Visit      Cardiovascular and Mediastinum   Arteriosclerosis of coronary artery   Aortic atherosclerosis ([place])    Other Visit Diagnoses    Annual physical exam    -  Primary   Relevant Orders   Pap IG (Image Guided)   POCT Occult Blood Stool (Completed)   Type 2 diabetes mellitus without complication, without long-term current use of insulin (HCC)       Overactive bladder       Relevant Medications   tolterodine (DETROL) 2 MG tablet   Other Relevant Orders   POCT urinalysis dipstick (Completed)   Screening for vaginal cancer  Relevant Orders   Pap IG (Image Guided)   Colon cancer screening       Relevant Orders   POCT Occult Blood Stool (Completed)      Meds ordered this encounter  Medications  . tolterodine (DETROL) 2 MG tablet    Sig: Take 1 tablet (2 mg total) by mouth 2 (two) times daily.    Dispense:  60 tablet    Refill:  5      Dr. Otilio Miu [facility]  03/23/18

## 2018-03-28 LAB — PAP IG (IMAGE GUIDED): PAP Smear Comment: 0

## 2018-04-06 DIAGNOSIS — M62830 Muscle spasm of back: Secondary | ICD-10-CM | POA: Diagnosis not present

## 2018-04-06 DIAGNOSIS — M9905 Segmental and somatic dysfunction of pelvic region: Secondary | ICD-10-CM | POA: Diagnosis not present

## 2018-04-06 DIAGNOSIS — M546 Pain in thoracic spine: Secondary | ICD-10-CM | POA: Diagnosis not present

## 2018-04-06 DIAGNOSIS — M9903 Segmental and somatic dysfunction of lumbar region: Secondary | ICD-10-CM | POA: Diagnosis not present

## 2018-04-06 DIAGNOSIS — M6080 Other myositis, unspecified site: Secondary | ICD-10-CM | POA: Diagnosis not present

## 2018-04-06 DIAGNOSIS — M461 Sacroiliitis, not elsewhere classified: Secondary | ICD-10-CM | POA: Diagnosis not present

## 2018-04-06 DIAGNOSIS — M955 Acquired deformity of pelvis: Secondary | ICD-10-CM | POA: Diagnosis not present

## 2018-04-06 DIAGNOSIS — M9902 Segmental and somatic dysfunction of thoracic region: Secondary | ICD-10-CM | POA: Diagnosis not present

## 2018-04-06 DIAGNOSIS — M5442 Lumbago with sciatica, left side: Secondary | ICD-10-CM | POA: Diagnosis not present

## 2018-04-20 DIAGNOSIS — H401132 Primary open-angle glaucoma, bilateral, moderate stage: Secondary | ICD-10-CM | POA: Diagnosis not present

## 2018-04-25 DIAGNOSIS — E119 Type 2 diabetes mellitus without complications: Secondary | ICD-10-CM | POA: Diagnosis not present

## 2018-05-03 ENCOUNTER — Other Ambulatory Visit: Payer: Self-pay

## 2018-05-09 DIAGNOSIS — M6080 Other myositis, unspecified site: Secondary | ICD-10-CM | POA: Diagnosis not present

## 2018-05-09 DIAGNOSIS — M955 Acquired deformity of pelvis: Secondary | ICD-10-CM | POA: Diagnosis not present

## 2018-05-09 DIAGNOSIS — M546 Pain in thoracic spine: Secondary | ICD-10-CM | POA: Diagnosis not present

## 2018-05-09 DIAGNOSIS — M9903 Segmental and somatic dysfunction of lumbar region: Secondary | ICD-10-CM | POA: Diagnosis not present

## 2018-05-09 DIAGNOSIS — M9905 Segmental and somatic dysfunction of pelvic region: Secondary | ICD-10-CM | POA: Diagnosis not present

## 2018-05-09 DIAGNOSIS — M461 Sacroiliitis, not elsewhere classified: Secondary | ICD-10-CM | POA: Diagnosis not present

## 2018-05-09 DIAGNOSIS — M5442 Lumbago with sciatica, left side: Secondary | ICD-10-CM | POA: Diagnosis not present

## 2018-05-09 DIAGNOSIS — M62830 Muscle spasm of back: Secondary | ICD-10-CM | POA: Diagnosis not present

## 2018-05-09 DIAGNOSIS — M9902 Segmental and somatic dysfunction of thoracic region: Secondary | ICD-10-CM | POA: Diagnosis not present

## 2018-05-16 ENCOUNTER — Other Ambulatory Visit: Payer: Self-pay | Admitting: Family Medicine

## 2018-05-16 DIAGNOSIS — K219 Gastro-esophageal reflux disease without esophagitis: Secondary | ICD-10-CM

## 2018-05-30 ENCOUNTER — Encounter: Payer: Self-pay | Admitting: Emergency Medicine

## 2018-05-30 ENCOUNTER — Ambulatory Visit
Admission: EM | Admit: 2018-05-30 | Discharge: 2018-05-30 | Disposition: A | Discharge status: Home / Self Care | Payer: 59 | Attending: Family Medicine | Admitting: Family Medicine

## 2018-05-30 ENCOUNTER — Other Ambulatory Visit: Payer: Self-pay | Admitting: Family Medicine

## 2018-05-30 ENCOUNTER — Other Ambulatory Visit: Payer: Self-pay

## 2018-05-30 DIAGNOSIS — I2510 Atherosclerotic heart disease of native coronary artery without angina pectoris: Secondary | ICD-10-CM

## 2018-05-30 DIAGNOSIS — E785 Hyperlipidemia, unspecified: Secondary | ICD-10-CM

## 2018-05-30 NOTE — ED Triage Notes (Signed)
Patient reported this afternoon she had a sharp pain on the left side of her head, now having pain when she turns her neck. She spoke with a nurse that calls her monthly and the nurse advised her to come to urgent care. Patient states she currently does not have head pain.

## 2018-06-15 ENCOUNTER — Other Ambulatory Visit: Payer: Self-pay | Admitting: Family Medicine

## 2018-06-15 DIAGNOSIS — E1165 Type 2 diabetes mellitus with hyperglycemia: Secondary | ICD-10-CM

## 2018-06-15 DIAGNOSIS — IMO0001 Reserved for inherently not codable concepts without codable children: Secondary | ICD-10-CM

## 2018-06-15 DIAGNOSIS — I10 Essential (primary) hypertension: Secondary | ICD-10-CM

## 2018-06-22 DIAGNOSIS — M955 Acquired deformity of pelvis: Secondary | ICD-10-CM | POA: Diagnosis not present

## 2018-06-22 DIAGNOSIS — M9905 Segmental and somatic dysfunction of pelvic region: Secondary | ICD-10-CM | POA: Diagnosis not present

## 2018-06-22 DIAGNOSIS — M6080 Other myositis, unspecified site: Secondary | ICD-10-CM | POA: Diagnosis not present

## 2018-06-22 DIAGNOSIS — M546 Pain in thoracic spine: Secondary | ICD-10-CM | POA: Diagnosis not present

## 2018-06-22 DIAGNOSIS — M9902 Segmental and somatic dysfunction of thoracic region: Secondary | ICD-10-CM | POA: Diagnosis not present

## 2018-06-22 DIAGNOSIS — M461 Sacroiliitis, not elsewhere classified: Secondary | ICD-10-CM | POA: Diagnosis not present

## 2018-06-22 DIAGNOSIS — M62830 Muscle spasm of back: Secondary | ICD-10-CM | POA: Diagnosis not present

## 2018-06-22 DIAGNOSIS — M9903 Segmental and somatic dysfunction of lumbar region: Secondary | ICD-10-CM | POA: Diagnosis not present

## 2018-06-22 DIAGNOSIS — M5442 Lumbago with sciatica, left side: Secondary | ICD-10-CM | POA: Diagnosis not present

## 2018-07-05 ENCOUNTER — Encounter: Payer: Self-pay | Admitting: Family Medicine

## 2018-07-05 ENCOUNTER — Ambulatory Visit (INDEPENDENT_AMBULATORY_CARE_PROVIDER_SITE_OTHER): Payer: 59 | Admitting: Family Medicine

## 2018-07-05 VITALS — BP 120/54 | HR 60 | Ht 71.0 in | Wt 167.0 lb

## 2018-07-05 DIAGNOSIS — E785 Hyperlipidemia, unspecified: Secondary | ICD-10-CM | POA: Diagnosis not present

## 2018-07-05 DIAGNOSIS — N3941 Urge incontinence: Secondary | ICD-10-CM | POA: Diagnosis not present

## 2018-07-05 DIAGNOSIS — B0229 Other postherpetic nervous system involvement: Secondary | ICD-10-CM | POA: Diagnosis not present

## 2018-07-05 DIAGNOSIS — E1165 Type 2 diabetes mellitus with hyperglycemia: Secondary | ICD-10-CM

## 2018-07-05 DIAGNOSIS — J302 Other seasonal allergic rhinitis: Secondary | ICD-10-CM | POA: Diagnosis not present

## 2018-07-05 DIAGNOSIS — I2510 Atherosclerotic heart disease of native coronary artery without angina pectoris: Secondary | ICD-10-CM

## 2018-07-05 DIAGNOSIS — IMO0001 Reserved for inherently not codable concepts without codable children: Secondary | ICD-10-CM

## 2018-07-05 DIAGNOSIS — K219 Gastro-esophageal reflux disease without esophagitis: Secondary | ICD-10-CM

## 2018-07-05 DIAGNOSIS — I10 Essential (primary) hypertension: Secondary | ICD-10-CM

## 2018-07-05 MED ORDER — SITAGLIPTIN PHOSPHATE 100 MG PO TABS: 100.0000 mg | ORAL_TABLET | Freq: Every day | ORAL | 0 refills | Status: DC

## 2018-07-05 MED ORDER — OMEPRAZOLE 20 MG PO CPDR: DELAYED_RELEASE_CAPSULE | ORAL | 3 refills | Status: DC

## 2018-07-05 MED ORDER — LOSARTAN POTASSIUM 50 MG PO TABS
50.0000 mg | ORAL_TABLET | Freq: Every day | ORAL | 1 refills | Status: DC
Start: 2018-07-05 — End: 2018-11-24

## 2018-07-05 MED ORDER — MONTELUKAST SODIUM 10 MG PO TABS: 10.0000 mg | ORAL_TABLET | Freq: Every day | ORAL | 3 refills | Status: DC

## 2018-07-05 MED ORDER — NORTRIPTYLINE HCL 10 MG PO CAPS: 10.0000 mg | ORAL_CAPSULE | Freq: Every day | ORAL | 1 refills | Status: DC

## 2018-07-05 MED ORDER — PRAVASTATIN SODIUM 40 MG PO TABS: 40.0000 mg | ORAL_TABLET | Freq: Every day | ORAL | 1 refills | Status: DC

## 2018-07-05 NOTE — Assessment & Plan Note (Signed)
Chronic controlled Continue omeprazole 20 mg daily

## 2018-07-05 NOTE — Assessment & Plan Note (Signed)
Chronic Controlled Continue Pravastatin 40 mg daily. Check lipid panel.

## 2018-07-05 NOTE — Assessment & Plan Note (Signed)
Pending A1C determines control status. Continue Januvia 100mg . Will check renal function.

## 2018-07-05 NOTE — Progress Notes (Signed)
Name: Breanna Petty   MRN: [id number]    DOB: 04-09-1941   Date:07/05/2018       Progress Note  Subjective  Chief Complaint  Chief Complaint  Patient presents with  . Diabetes  . Hypertension  . Hyperlipidemia    Diabetes  She presents for her follow-up diabetic visit. She has type 2 diabetes mellitus. Her disease course has been stable. Pertinent negatives for hypoglycemia include no confusion, dizziness, headaches, hunger, mood changes, nervousness/anxiousness, pallor, seizures, sleepiness, speech difficulty, sweats or tremors. Pertinent negatives for diabetes include no blurred vision, no chest pain, no fatigue, no foot paresthesias, no foot ulcerations, no polydipsia, no polyphagia, no polyuria, no visual change, no weakness and no weight loss. There are no hypoglycemic complications. Symptoms are stable. There are no diabetic complications. Pertinent negatives for diabetic complications include no CVA, PVD or retinopathy. Risk factors for coronary artery disease include diabetes mellitus, dyslipidemia and hypertension. Current diabetic treatment includes oral agent (monotherapy) Celesta Gentile). She is compliant with treatment all of the time. Her weight is stable. She is following a generally healthy diet. She participates in exercise daily. Her home blood glucose trend is fluctuating minimally. Her breakfast blood glucose is taken between 8-9 am. Her breakfast blood glucose range is generally 110-130 mg/dl. An ACE inhibitor/angiotensin II receptor blocker is being taken. She does not see a podiatrist.Eye exam is not current.  Hypertension  This is a chronic problem. The current episode started more than 1 year ago. The problem has been gradually improving since onset. The problem is controlled. Pertinent negatives include no anxiety, blurred vision, chest pain, headaches, malaise/fatigue, neck pain, orthopnea, palpitations, peripheral edema, PND, shortness of breath or sweats. There are no  associated agents to hypertension. Risk factors for coronary artery disease include diabetes mellitus and dyslipidemia. Past treatments include ACE inhibitors and calcium channel blockers. The current treatment provides moderate improvement. There are no compliance problems.  There is no history of angina, kidney disease, CAD/MI, CVA, heart failure, left ventricular hypertrophy, PVD or retinopathy. There is no history of chronic renal disease or a hypertension causing med.  Hyperlipidemia  This is a chronic problem. The current episode started more than 1 year ago. The problem is controlled. Recent lipid tests were reviewed and are normal. She has no history of chronic renal disease, diabetes, hypothyroidism or nephrotic syndrome. Pertinent negatives include no chest pain, focal weakness, leg pain, myalgias or shortness of breath. The current treatment provides moderate improvement of lipids. There are no compliance problems.   Gastroesophageal Reflux  She reports no abdominal pain, no belching, no chest pain, no choking, no coughing, no dysphagia, no early satiety, no globus sensation, no heartburn, no hoarse voice, no nausea, no sore throat, no stridor, no tooth decay, no water brash or no wheezing. This is a new problem. The current episode started in the past 7 days. The problem occurs constantly. The problem has been waxing and waning. The symptoms are aggravated by certain foods. Pertinent negatives include no fatigue, melena or weight loss. She has tried a PPI for the symptoms. The treatment provided moderate relief.  Depression       The patient presents with depression.  This is a chronic problem.  The onset quality is gradual.   The problem has been waxing and waning since onset.  Associated symptoms include sad.  Associated symptoms include no decreased concentration, no fatigue, no helplessness, no hopelessness, does not have insomnia, not irritable, no restlessness, no decreased interest, no  appetite change, no body aches, no myalgias, no headaches and no suicidal ideas.  Treatments tried: pamelor.  Compliance with treatment is good.  Previous treatment provided mild relief.  Past medical history includes depression.     Pertinent negatives include no hypothyroidism and no anxiety.   Uncontrolled type 2 diabetes mellitus without complication, without long-term current use of insulin ([place]) Pending A1C determines control status. Continue Januvia 100mg . Will check renal function.  Dyslipidemia Chronic Controlled Continue Pravastatin 40 mg daily. Check lipid panel.  Urge incontinence of urine Chronic Stable Continue nortriptyline 10 mg daily  Hypertension, benign Chronic Controlled Continue Losartan 50 mg daily. Check renal panel.  Gastro-esophageal reflux disease without esophagitis Chronic controlled Continue omeprazole 20 mg daily  Arteriosclerosis of coronary artery Followed with Dr Nehemiah Massed. Continue medications as noted below.   Past Medical History:  Diagnosis Date  . Anginal pain ([place])   . Anxiety   . Aortic atherosclerosis ([place])   . Arthritis   . Atherosclerotic heart disease   . Depression   . Diabetes mellitus without complication ([place])   . Diverticulosis of colon   . Dysrhythmia   . GERD (gastroesophageal reflux disease)   . Hiatal hernia   . History of heart artery stent   . History of shingles   . HOH (hard of hearing)   . Hyperlipidemia   . Hypertension   . Myocardial infarction ([place])   . Sarcoidosis   . Vitamin D deficiency     Past Surgical History:  Procedure Laterality Date  . ABDOMINAL HYSTERECTOMY  1979  . APPENDECTOMY    . CARDIAC SURGERY    . CATARACT EXTRACTION W/PHACO Right 10/28/2017   Procedure: CATARACT EXTRACTION PHACO AND INTRAOCULAR LENS PLACEMENT (IOC);  Surgeon: Eulogio Bear, MD;  Location: [facility] ORS;  Service: Ophthalmology;  Laterality: Right;  Lot # [id number] H [place]: 00:53.9 AP%: 10.6 CDE: 5.71  . CORONARY ANGIOPLASTY      STENT  . LAPAROTOMY N/A 10/04/2016   Procedure: EXPLORATORY LAPAROTOMY;  Surgeon: Dia Crawford III, MD;  Location: [facility] ORS;  Service: General;  Laterality: N/A;    Family History  Problem Relation Age of Onset  . Diabetes Daughter   . Hypertension Mother   . Diabetes Son   . Breast cancer Neg Hx     Social History   Socioeconomic History  . Marital status: Single    Spouse name: Not on file  . Number of children: Not on file  . Years of education: Not on file  . Highest education level: Not on file  Occupational History  . Not on file  Social Needs  . Financial resource strain: Not on file  . Food insecurity:    Worry: Not on file    Inability: Not on file  . Transportation needs:    Medical: Not on file    Non-medical: Not on file  Tobacco Use  . Smoking status: Former Smoker    Packs/day: 2.00    Years: 10.00    Pack years: 20.00    Types: Cigarettes    Last attempt to quit: 11/16/1988    Years since quitting: 29.6  . Smokeless tobacco: Never Used  Substance and Sexual Activity  . Alcohol use: No    Alcohol/week: 0.0 standard drinks  . Drug use: No  . Sexual activity: Not Currently  Lifestyle  . Physical activity:    Days per week: Not on file    Minutes per session: Not on file  . Stress: Not  on file  Relationships  . Social connections:    Talks on phone: Not on file    Gets together: Not on file    Attends religious service: Not on file    Active member of club or organization: Not on file    Attends meetings of clubs or organizations: Not on file    Relationship status: Not on file  . Intimate partner violence:    Fear of current or ex partner: Not on file    Emotionally abused: Not on file    Physically abused: Not on file    Forced sexual activity: Not on file  Other Topics Concern  . Not on file  Social History Narrative  . Not on file    Allergies  Allergen Reactions  . Ace Inhibitors     Other reaction(s): Cough  . Codeine Swelling   . Codeine Sulfate     Other reaction(s): Unknown  . Ezetimibe     Other reaction(s): Muscle Pain  . Other     Other reaction(s): Muscle Pain    Outpatient Medications Prior to Visit  Medication Sig Dispense Refill  . aspirin EC 81 MG tablet Take 1 tablet (81 mg total) by mouth daily. 90 tablet 3  . BAYER CONTOUR NEXT TEST test strip TEST 2 TIMES A DAY 200 each 3  . cyclobenzaprine (FLEXERIL) 5 MG tablet Take 1 tablet (5 mg total) by mouth at bedtime. 30 tablet 6  . latanoprost (XALATAN) 0.005 % ophthalmic solution Place 1 drop into both eyes at bedtime.     . meloxicam (MOBIC) 15 MG tablet Take 1 tablet (15 mg total) by mouth daily. 30 tablet 6  . Multiple Vitamin (MULTIVITAMIN) capsule Take 1 capsule by mouth daily.    Marland Kitchen NITROSTAT 0.4 MG SL tablet DISSOLVE ONE TABLET UNDER TONGUE AS NEED FOR CHEST PAIN AS DIRECTED 30 tablet 0  . amLODipine (NORVASC) 5 MG tablet TAKE 1 TABLET BY MOUTH DAILY. 90 tablet 0  . JANUVIA 100 MG tablet TAKE 1 TABLET BY MOUTH DAILY. 90 tablet 0  . losartan (COZAAR) 50 MG tablet Take 1 tablet (50 mg total) by mouth daily. 90 tablet 1  . montelukast (SINGULAIR) 10 MG tablet Take 1 tablet (10 mg total) by mouth at bedtime. 30 tablet 3  . nortriptyline (PAMELOR) 10 MG capsule Take 1 capsule (10 mg total) by mouth at bedtime. 90 capsule 1  . omeprazole (PRILOSEC) 20 MG capsule TAKE 1 CAPSULE BY MOUTH 2 TIMES DAILY. 180 capsule 0  . pravastatin (PRAVACHOL) 40 MG tablet TAKE 1 TABLET BY MOUTH DAILY. 90 tablet 0  . tolterodine (DETROL) 2 MG tablet Take 1 tablet (2 mg total) by mouth 2 (two) times daily. 60 tablet 5  . lidocaine (LIDODERM) 5 % Place 1 patch onto the skin daily. Remove & Discard patch within 12 hours or as directed by MD 10 patch 0   No facility-administered medications prior to visit.     Review of Systems  Constitutional: Negative for appetite change, chills, fatigue, fever, malaise/fatigue and weight loss.  HENT: Negative for ear discharge, ear  pain, hoarse voice and sore throat.   Eyes: Negative for blurred vision.  Respiratory: Negative for cough, sputum production, choking, shortness of breath and wheezing.   Cardiovascular: Negative for chest pain, palpitations, orthopnea, leg swelling and PND.  Gastrointestinal: Negative for abdominal pain, blood in stool, constipation, diarrhea, dysphagia, heartburn, melena and nausea.  Genitourinary: Negative for dysuria, frequency, hematuria and urgency.  Musculoskeletal:  Negative for back pain, joint pain, myalgias and neck pain.  Skin: Negative for pallor and rash.  Neurological: Negative for dizziness, tingling, tremors, sensory change, focal weakness, seizures, speech difficulty, weakness and headaches.  Endo/Heme/Allergies: Negative for environmental allergies, polydipsia and polyphagia. Does not bruise/bleed easily.  Psychiatric/Behavioral: Positive for depression. Negative for confusion, decreased concentration and suicidal ideas. The patient is not nervous/anxious and does not have insomnia.      Objective  Vitals:   07/05/18 0847  BP: (!) 120/54  Pulse: 60  Weight: 167 lb (75.8 kg)  Height: 5\' 11"  (1.803 m)    Physical Exam  Constitutional: She is oriented to person, place, and time. She appears well-developed and well-nourished. She is not irritable.  HENT:  Head: Normocephalic.  Right Ear: External ear normal.  Left Ear: External ear normal.  Mouth/Throat: Oropharynx is clear and moist.  Eyes: Pupils are equal, round, and reactive to light. Conjunctivae and EOM are normal. Lids are everted and swept, no foreign bodies found. Left eye exhibits no hordeolum. No foreign body present in the left eye. Right conjunctiva is not injected. Left conjunctiva is not injected. No scleral icterus.  Neck: Normal range of motion. Neck supple. No JVD present. No tracheal deviation present. No thyromegaly present.  Cardiovascular: Normal rate, regular rhythm, normal heart sounds and intact  distal pulses. Exam reveals no gallop and no friction rub.  No murmur heard. Pulmonary/Chest: Effort normal and breath sounds normal. No respiratory distress. She has no wheezes. She has no rales.  Abdominal: Soft. Bowel sounds are normal. She exhibits no mass. There is no hepatosplenomegaly. There is no tenderness. There is no rebound and no guarding.  Musculoskeletal: Normal range of motion. She exhibits no edema or tenderness.  Lymphadenopathy:    She has no cervical adenopathy.  Neurological: She is alert and oriented to person, place, and time. She has normal strength. She displays normal reflexes. No cranial nerve deficit.  Skin: Skin is warm. No rash noted.  Psychiatric: She has a normal mood and affect. Her mood appears not anxious. She does not exhibit a depressed mood.  Nursing note and vitals reviewed.     Assessment & Plan  Problem List Items Addressed This Visit      Cardiovascular and Mediastinum   Arteriosclerosis of coronary artery    Followed with Dr Nehemiah Massed. Continue medications as noted below.      Relevant Medications   losartan (COZAAR) 50 MG tablet   pravastatin (PRAVACHOL) 40 MG tablet   Hypertension, benign    Chronic Controlled Continue Losartan 50 mg daily. Check renal panel.      Relevant Medications   losartan (COZAAR) 50 MG tablet   pravastatin (PRAVACHOL) 40 MG tablet   Other Relevant Orders   Renal Function Panel     Digestive   Gastro-esophageal reflux disease without esophagitis    Chronic controlled Continue omeprazole 20 mg daily      Relevant Medications   omeprazole (PRILOSEC) 20 MG capsule     Endocrine   Uncontrolled type 2 diabetes mellitus without complication, without long-term current use of insulin ([place]) - Primary    Pending A1C determines control status. Continue Januvia 100mg . Will check renal function.      Relevant Medications   sitaGLIPtin (JANUVIA) 100 MG tablet   losartan (COZAAR) 50 MG tablet   pravastatin  (PRAVACHOL) 40 MG tablet   Other Relevant Orders   Hemoglobin A1c     Nervous and Auditory   HZV (herpes  zoster virus) post herpetic neuralgia     Other   Dyslipidemia    Chronic Controlled Continue Pravastatin 40 mg daily. Check lipid panel.      Relevant Medications   pravastatin (PRAVACHOL) 40 MG tablet   Other Relevant Orders   Lipid panel   Urge incontinence of urine    Chronic Stable Continue nortriptyline 10 mg daily      Relevant Medications   nortriptyline (PAMELOR) 10 MG capsule    Other Visit Diagnoses    Seasonal allergic rhinitis, unspecified trigger       Episodic uncontrolled Start singulair 10 mg   Relevant Medications   montelukast (SINGULAIR) 10 MG tablet      Meds ordered this encounter  Medications  . sitaGLIPtin (JANUVIA) 100 MG tablet    Sig: Take 1 tablet (100 mg total) by mouth daily.    Dispense:  90 tablet    Refill:  0  . montelukast (SINGULAIR) 10 MG tablet    Sig: Take 1 tablet (10 mg total) by mouth at bedtime.    Dispense:  30 tablet    Refill:  3  . nortriptyline (PAMELOR) 10 MG capsule    Sig: Take 1 capsule (10 mg total) by mouth at bedtime.    Dispense:  90 capsule    Refill:  1  . losartan (COZAAR) 50 MG tablet    Sig: Take 1 tablet (50 mg total) by mouth daily.    Dispense:  90 tablet    Refill:  1    Pt has been taking with no issues  . omeprazole (PRILOSEC) 20 MG capsule    Sig: One twice a day    Dispense:  180 capsule    Refill:  3  . pravastatin (PRAVACHOL) 40 MG tablet    Sig: Take 1 tablet (40 mg total) by mouth daily.    Dispense:  90 tablet    Refill:  1      Dr. Otilio Miu [facility]  07/05/18

## 2018-07-05 NOTE — Assessment & Plan Note (Signed)
Chronic Stable Continue nortriptyline 10 mg daily

## 2018-07-05 NOTE — Assessment & Plan Note (Signed)
Chronic Controlled Continue Losartan 50 mg daily. Check renal panel.

## 2018-07-05 NOTE — Assessment & Plan Note (Signed)
Followed with Dr Nehemiah Massed. Continue medications as noted below.

## 2018-07-06 DIAGNOSIS — M79671 Pain in right foot: Secondary | ICD-10-CM | POA: Diagnosis not present

## 2018-07-06 DIAGNOSIS — B351 Tinea unguium: Secondary | ICD-10-CM | POA: Diagnosis not present

## 2018-07-06 DIAGNOSIS — M79672 Pain in left foot: Secondary | ICD-10-CM | POA: Diagnosis not present

## 2018-07-06 DIAGNOSIS — E119 Type 2 diabetes mellitus without complications: Secondary | ICD-10-CM | POA: Diagnosis not present

## 2018-07-06 LAB — RENAL FUNCTION PANEL
ALBUMIN: 4.4 g/dL (ref 3.5–4.8)
BUN/Creatinine Ratio: 16 (ref 12–28)
BUN: 17 mg/dL (ref 8–27)
CHLORIDE: 101 mmol/L (ref 96–106)
CO2: 25 mmol/L (ref 20–29)
Calcium: 9.7 mg/dL (ref 8.7–10.3)
Creatinine, Ser: 1.08 mg/dL — ABNORMAL HIGH (ref 0.57–1.00)
GFR calc Af Amer: 58 mL/min/{1.73_m2} — ABNORMAL LOW (ref >59)
GFR calc non Af Amer: 50 mL/min/{1.73_m2} — ABNORMAL LOW (ref >59)
Glucose: 88 mg/dL (ref 65–99)
PHOSPHORUS: 3.3 mg/dL (ref 2.5–4.5)
Potassium: 4.7 mmol/L (ref 3.5–5.2)
Sodium: 141 mmol/L (ref 134–144)

## 2018-07-06 LAB — HEMOGLOBIN A1C
Est. average glucose Bld gHb Est-mCnc: 143 mg/dL
Hgb A1c MFr Bld: 6.6 % — ABNORMAL HIGH (ref 4.8–5.6)

## 2018-07-06 LAB — LIPID PANEL
Chol/HDL Ratio: 2 ratio (ref 0.0–4.4)
Cholesterol, Total: 192 mg/dL (ref 100–199)
HDL: 94 mg/dL (ref >39)
LDL Calculated: 88 mg/dL (ref 0–99)
TRIGLYCERIDES: 52 mg/dL (ref 0–149)
VLDL Cholesterol Cal: 10 mg/dL (ref 5–40)

## 2018-07-07 DIAGNOSIS — I10 Essential (primary) hypertension: Secondary | ICD-10-CM | POA: Diagnosis not present

## 2018-07-07 DIAGNOSIS — I2510 Atherosclerotic heart disease of native coronary artery without angina pectoris: Secondary | ICD-10-CM | POA: Diagnosis not present

## 2018-07-07 DIAGNOSIS — E782 Mixed hyperlipidemia: Secondary | ICD-10-CM | POA: Diagnosis not present

## 2018-07-07 DIAGNOSIS — E118 Type 2 diabetes mellitus with unspecified complications: Secondary | ICD-10-CM | POA: Diagnosis not present

## 2018-07-07 DIAGNOSIS — I214 Non-ST elevation (NSTEMI) myocardial infarction: Secondary | ICD-10-CM | POA: Diagnosis not present

## 2018-07-27 DIAGNOSIS — M5442 Lumbago with sciatica, left side: Secondary | ICD-10-CM | POA: Diagnosis not present

## 2018-07-27 DIAGNOSIS — M546 Pain in thoracic spine: Secondary | ICD-10-CM | POA: Diagnosis not present

## 2018-07-27 DIAGNOSIS — M955 Acquired deformity of pelvis: Secondary | ICD-10-CM | POA: Diagnosis not present

## 2018-07-27 DIAGNOSIS — M9902 Segmental and somatic dysfunction of thoracic region: Secondary | ICD-10-CM | POA: Diagnosis not present

## 2018-07-27 DIAGNOSIS — M9905 Segmental and somatic dysfunction of pelvic region: Secondary | ICD-10-CM | POA: Diagnosis not present

## 2018-07-27 DIAGNOSIS — M9903 Segmental and somatic dysfunction of lumbar region: Secondary | ICD-10-CM | POA: Diagnosis not present

## 2018-07-27 DIAGNOSIS — M62830 Muscle spasm of back: Secondary | ICD-10-CM | POA: Diagnosis not present

## 2018-07-27 DIAGNOSIS — M461 Sacroiliitis, not elsewhere classified: Secondary | ICD-10-CM | POA: Diagnosis not present

## 2018-07-27 DIAGNOSIS — M6080 Other myositis, unspecified site: Secondary | ICD-10-CM | POA: Diagnosis not present

## 2018-08-16 ENCOUNTER — Other Ambulatory Visit: Payer: Self-pay | Admitting: Family Medicine

## 2018-09-02 ENCOUNTER — Ambulatory Visit: Payer: 59 | Admitting: Family Medicine

## 2018-09-05 ENCOUNTER — Ambulatory Visit: Payer: 59 | Admitting: Family Medicine

## 2018-09-23 ENCOUNTER — Ambulatory Visit (INDEPENDENT_AMBULATORY_CARE_PROVIDER_SITE_OTHER): Payer: 59 | Admitting: Family Medicine

## 2018-09-23 ENCOUNTER — Ambulatory Visit
Admission: RE | Admit: 2018-09-23 | Discharge: 2018-09-23 | Disposition: A | Discharge status: Home / Self Care | Payer: 59 | Source: Ambulatory Visit | Attending: Family Medicine | Admitting: Family Medicine

## 2018-09-23 ENCOUNTER — Encounter: Payer: Self-pay | Admitting: Family Medicine

## 2018-09-23 ENCOUNTER — Ambulatory Visit: Payer: Self-pay

## 2018-09-23 VITALS — BP 120/70 | HR 76 | Ht 71.0 in | Wt 169.0 lb

## 2018-09-23 DIAGNOSIS — M898X8 Other specified disorders of bone, other site: Secondary | ICD-10-CM

## 2018-09-23 DIAGNOSIS — I2510 Atherosclerotic heart disease of native coronary artery without angina pectoris: Secondary | ICD-10-CM

## 2018-09-23 DIAGNOSIS — R102 Pelvic and perineal pain: Secondary | ICD-10-CM | POA: Diagnosis not present

## 2018-09-23 NOTE — Progress Notes (Signed)
Date:  09/23/2018   Name:  Breanna Petty   DOB:  1941-06-22   MRN:  [id number]   Chief Complaint: Flank Pain (R) sided pain comes and goes) Flank Pain  This is a new problem. The current episode started 1 to 4 weeks ago (not present now). The problem occurs intermittently. The pain is present in the sacro-iliac (right iliac crest). The quality of the pain is described as aching. The pain does not radiate. The pain is at a severity of 5/10. The pain is moderate. Exacerbated by: palpation. Pertinent negatives include no abdominal pain, bladder incontinence, bowel incontinence, chest pain, dysuria, fever, headaches, leg pain, numbness, paresis, paresthesias, pelvic pain, perianal numbness, tingling, weakness or weight loss.     Review of Systems  Constitutional: Negative.  Negative for chills, fatigue, fever, unexpected weight change and weight loss.  HENT: Negative for congestion, ear discharge, ear pain, rhinorrhea, sinus pressure, sneezing and sore throat.   Eyes: Negative for photophobia, pain, discharge, redness and itching.  Respiratory: Negative for cough, shortness of breath, wheezing and stridor.   Cardiovascular: Negative for chest pain.  Gastrointestinal: Negative for abdominal pain, blood in stool, bowel incontinence, constipation, diarrhea, nausea and vomiting.  Endocrine: Negative for cold intolerance, heat intolerance, polydipsia, polyphagia and polyuria.  Genitourinary: Positive for flank pain. Negative for bladder incontinence, dysuria, frequency, hematuria, menstrual problem, pelvic pain, urgency, vaginal bleeding and vaginal discharge.  Musculoskeletal: Negative for arthralgias, back pain and myalgias.  Skin: Negative for rash.  Allergic/Immunologic: Negative for environmental allergies and food allergies.  Neurological: Negative for dizziness, tingling, weakness, light-headedness, numbness, headaches and paresthesias.  Hematological: Negative for adenopathy. Does not  bruise/bleed easily.  Psychiatric/Behavioral: Negative for dysphoric mood. The patient is not nervous/anxious.     Patient Active Problem List   Diagnosis Date Noted  . Status post exploratory laparotomy 10/21/2016  . Myocardial infarction ([place]) 10/20/2016  . SBO (small bowel obstruction) ([place])   . Aortic atherosclerosis ([place]) 10/02/2016  . Diverticulosis of large intestine without hemorrhage 10/02/2016  . Acute diverticulitis 10/02/2016  . Hammer toe of right foot 10/16/2015  . Nail deformity 10/16/2015  . Chronic obstructive pulmonary disease ([place]) 09/04/2015  . History of MI (myocardial infarction) 09/04/2015  . Spondylolisthesis at L4-L5 level 07/11/2015  . DDD (degenerative disc disease), lumbar 07/11/2015  . History of sarcoidosis 06/04/2015  . Urge incontinence of urine 06/04/2015  . Hearing loss of both ears 05/07/2015  . Anxiety and depression 05/04/2015  . Arteriosclerosis of coronary artery 05/04/2015  . Uncontrolled type 2 diabetes mellitus without complication, without long-term current use of insulin ([place]) 05/04/2015  . Dyslipidemia 05/04/2015  . Hypertension, benign 05/04/2015  . Gastro-esophageal reflux disease without esophagitis 05/04/2015  . Hiatal hernia 05/04/2015  . HZV (herpes zoster virus) post herpetic neuralgia 05/04/2015  . Vitamin D deficiency 05/04/2015  . S/P coronary artery stent placement 05/04/2015    Allergies  Allergen Reactions  . Ace Inhibitors     Other reaction(s): Cough  . Codeine Swelling  . Codeine Sulfate     Other reaction(s): Unknown  . Ezetimibe     Other reaction(s): Muscle Pain  . Other     Other reaction(s): Muscle Pain    Past Surgical History:  Procedure Laterality Date  . ABDOMINAL HYSTERECTOMY  1979  . APPENDECTOMY    . CARDIAC SURGERY    . CATARACT EXTRACTION W/PHACO Right 10/28/2017   Procedure: CATARACT EXTRACTION PHACO AND INTRAOCULAR LENS PLACEMENT (IOC);  Surgeon: Eulogio Bear,  MD;  Location: [facility]  ORS;  Service: Ophthalmology;  Laterality: Right;  Lot # [id number] H [place]: 00:53.9 AP%: 10.6 CDE: 5.71  . CORONARY ANGIOPLASTY     STENT  . LAPAROTOMY N/A 10/04/2016   Procedure: EXPLORATORY LAPAROTOMY;  Surgeon: Dia Crawford III, MD;  Location: [facility] ORS;  Service: General;  Laterality: N/A;    Social History   Tobacco Use  . Smoking status: Former Smoker    Packs/day: 2.00    Years: 10.00    Pack years: 20.00    Types: Cigarettes    Last attempt to quit: 11/16/1988    Years since quitting: 29.8  . Smokeless tobacco: Never Used  Substance Use Topics  . Alcohol use: No    Alcohol/week: 0.0 standard drinks  . Drug use: No     Medication list has been reviewed and updated.  Current Meds  Medication Sig  . aspirin EC 81 MG tablet Take 1 tablet (81 mg total) by mouth daily.  Marland Kitchen glucose blood (FREESTYLE LITE) test strip Test once a day  . latanoprost (XALATAN) 0.005 % ophthalmic solution Place 1 drop into both eyes at bedtime.   Marland Kitchen losartan (COZAAR) 50 MG tablet Take 1 tablet (50 mg total) by mouth daily.  . montelukast (SINGULAIR) 10 MG tablet Take 1 tablet (10 mg total) by mouth at bedtime.  . Multiple Vitamin (MULTIVITAMIN) capsule Take 1 capsule by mouth daily.  Marland Kitchen NITROSTAT 0.4 MG SL tablet DISSOLVE ONE TABLET UNDER TONGUE AS NEED FOR CHEST PAIN AS DIRECTED  . nortriptyline (PAMELOR) 10 MG capsule Take 1 capsule (10 mg total) by mouth at bedtime.  Marland Kitchen omeprazole (PRILOSEC) 20 MG capsule One twice a day  . pravastatin (PRAVACHOL) 40 MG tablet Take 1 tablet (40 mg total) by mouth daily.  . sitaGLIPtin (JANUVIA) 100 MG tablet Take 1 tablet (100 mg total) by mouth daily.  . [DISCONTINUED] cyclobenzaprine (FLEXERIL) 5 MG tablet Take 1 tablet (5 mg total) by mouth at bedtime.  . [DISCONTINUED] meloxicam (MOBIC) 15 MG tablet Take 1 tablet (15 mg total) by mouth daily.    PHQ 2/9 Scores 09/23/2018 07/05/2018 08/30/2017 08/30/2017  PHQ - 2 Score 0 4 0 0  PHQ- 9 Score 0 5 2 -    Physical Exam   Constitutional: No distress.  HENT:  Head: Normocephalic and atraumatic.  Right Ear: External ear normal.  Left Ear: External ear normal.  Nose: Nose normal.  Mouth/Throat: Oropharynx is clear and moist.  Eyes: Pupils are equal, round, and reactive to light. Conjunctivae and EOM are normal. Right eye exhibits no discharge. Left eye exhibits no discharge.  Neck: Normal range of motion. Neck supple. No JVD present. No thyromegaly present.  Cardiovascular: Normal rate, regular rhythm, normal heart sounds and intact distal pulses. Exam reveals no gallop and no friction rub.  No murmur heard. Pulmonary/Chest: Effort normal and breath sounds normal. She has no wheezes. She has no rales.  Abdominal: Soft. Bowel sounds are normal. She exhibits no mass. There is no tenderness. There is no guarding.  Musculoskeletal: Normal range of motion. She exhibits no edema.       Right hip: She exhibits bony tenderness. She exhibits normal range of motion, normal strength, no tenderness, no swelling, no crepitus and no deformity.       Legs: Tenderness of right iliac crest anteriolateral aspect.  Lymphadenopathy:    She has no cervical adenopathy.  Neurological: She is alert. She has normal reflexes.  Skin: Skin is warm and dry.  No rash noted. She is not diaphoretic. No erythema.  Nursing note and vitals reviewed.   BP 120/70   Pulse 76   Ht 5\' 11"  (1.803 m)   Wt 169 lb (76.7 kg)   BMI 23.57 kg/m   Assessment and Plan:  1. Iliac crest bone pain Intermmitant. Presently not symptomatic. Will evaluate with xray of iliac crest.. Tylenol prn pain - DG Pelvis 1-2 Views; Future   Dr. Otilio Miu [facility] [place]  09/23/2018

## 2018-10-25 DIAGNOSIS — H401132 Primary open-angle glaucoma, bilateral, moderate stage: Secondary | ICD-10-CM | POA: Diagnosis not present

## 2018-11-02 DIAGNOSIS — E118 Type 2 diabetes mellitus with unspecified complications: Secondary | ICD-10-CM | POA: Diagnosis not present

## 2018-11-02 DIAGNOSIS — M25561 Pain in right knee: Secondary | ICD-10-CM | POA: Diagnosis not present

## 2018-11-02 DIAGNOSIS — G8929 Other chronic pain: Secondary | ICD-10-CM | POA: Diagnosis not present

## 2018-11-02 DIAGNOSIS — M1711 Unilateral primary osteoarthritis, right knee: Secondary | ICD-10-CM | POA: Diagnosis not present

## 2018-11-23 ENCOUNTER — Ambulatory Visit: Payer: 59 | Admitting: Family Medicine

## 2018-11-24 ENCOUNTER — Encounter: Payer: Self-pay | Admitting: Family Medicine

## 2018-11-24 ENCOUNTER — Ambulatory Visit (INDEPENDENT_AMBULATORY_CARE_PROVIDER_SITE_OTHER): Payer: 59 | Admitting: Family Medicine

## 2018-11-24 VITALS — BP 130/70 | HR 72 | Ht 71.0 in | Wt 166.0 lb

## 2018-11-24 DIAGNOSIS — J3089 Other allergic rhinitis: Secondary | ICD-10-CM | POA: Diagnosis not present

## 2018-11-24 DIAGNOSIS — I2510 Atherosclerotic heart disease of native coronary artery without angina pectoris: Secondary | ICD-10-CM | POA: Diagnosis not present

## 2018-11-24 DIAGNOSIS — J069 Acute upper respiratory infection, unspecified: Secondary | ICD-10-CM

## 2018-11-24 DIAGNOSIS — I10 Essential (primary) hypertension: Secondary | ICD-10-CM

## 2018-11-24 DIAGNOSIS — N3281 Overactive bladder: Secondary | ICD-10-CM | POA: Diagnosis not present

## 2018-11-24 DIAGNOSIS — E785 Hyperlipidemia, unspecified: Secondary | ICD-10-CM

## 2018-11-24 LAB — POCT URINALYSIS DIPSTICK
Bilirubin, UA: NEGATIVE
Glucose, UA: NEGATIVE
Ketones, UA: NEGATIVE
LEUKOCYTES UA: NEGATIVE
NITRITE UA: NEGATIVE
PH UA: 7 (ref 5.0–8.0)
PROTEIN UA: NEGATIVE
RBC UA: NEGATIVE
Spec Grav, UA: <=1.005 — AB (ref 1.010–1.025)
Urobilinogen, UA: 0.2 E.U./dL

## 2018-11-24 MED ORDER — TRIAMCINOLONE ACETONIDE 55 MCG/ACT NA AERO: 2.0000 | INHALATION_SPRAY | Freq: Every day | NASAL | 12 refills | Status: DC

## 2018-11-24 MED ORDER — PRAVASTATIN SODIUM 40 MG PO TABS: 40.0000 mg | ORAL_TABLET | Freq: Every day | ORAL | 0 refills | Status: DC

## 2018-11-24 MED ORDER — LOSARTAN POTASSIUM 50 MG PO TABS: 50.0000 mg | ORAL_TABLET | Freq: Every day | ORAL | 0 refills | Status: DC

## 2018-11-24 MED ORDER — LORATADINE 10 MG PO TABS: 10.0000 mg | ORAL_TABLET | Freq: Every day | ORAL | 11 refills | Status: DC

## 2018-11-24 NOTE — Progress Notes (Signed)
Date:  11/24/2018   Name:  Breanna Petty   DOB:  Feb 20, 1941   MRN:  [id number]   Chief Complaint: Allergic Rhinitis  (scratchy throat/ drainage- no production. using cough drops at night.)  URI   This is a new problem. The current episode started more than 1 month ago. The problem has been gradually worsening. There has been no fever. Associated symptoms include congestion, dysuria and rhinorrhea. Pertinent negatives include no abdominal pain, chest pain, coughing, diarrhea, ear pain, headaches, joint pain, joint swelling, nausea, neck pain, plugged ear sensation, rash, sinus pain, sneezing, sore throat, swollen glands, vomiting or wheezing. She has tried nothing for the symptoms.    Review of Systems  Constitutional: Negative.  Negative for chills, fatigue, fever and unexpected weight change.  HENT: Positive for congestion and rhinorrhea. Negative for ear discharge, ear pain, sinus pressure, sinus pain, sneezing and sore throat.   Eyes: Negative for photophobia, pain, discharge, redness and itching.  Respiratory: Negative for cough, shortness of breath, wheezing and stridor.   Cardiovascular: Negative for chest pain.  Gastrointestinal: Negative for abdominal pain, blood in stool, constipation, diarrhea, nausea and vomiting.  Endocrine: Negative for cold intolerance, heat intolerance, polydipsia, polyphagia and polyuria.  Genitourinary: Positive for dysuria. Negative for flank pain, frequency, hematuria, menstrual problem, pelvic pain, urgency, vaginal bleeding and vaginal discharge.  Musculoskeletal: Negative for arthralgias, back pain, joint pain, myalgias and neck pain.  Skin: Negative for rash.  Allergic/Immunologic: Negative for environmental allergies and food allergies.  Neurological: Negative for dizziness, weakness, light-headedness, numbness and headaches.  Hematological: Negative for adenopathy. Does not bruise/bleed easily.  Psychiatric/Behavioral: Negative for dysphoric  mood. The patient is not nervous/anxious.     Patient Active Problem List   Diagnosis Date Noted  . Status post exploratory laparotomy 10/21/2016  . Myocardial infarction ([place]) 10/20/2016  . SBO (small bowel obstruction) ([place])   . Aortic atherosclerosis ([place]) 10/02/2016  . Diverticulosis of large intestine without hemorrhage 10/02/2016  . Acute diverticulitis 10/02/2016  . Hammer toe of right foot 10/16/2015  . Nail deformity 10/16/2015  . Chronic obstructive pulmonary disease ([place]) 09/04/2015  . History of MI (myocardial infarction) 09/04/2015  . Spondylolisthesis at L4-L5 level 07/11/2015  . DDD (degenerative disc disease), lumbar 07/11/2015  . History of sarcoidosis 06/04/2015  . Urge incontinence of urine 06/04/2015  . Hearing loss of both ears 05/07/2015  . Anxiety and depression 05/04/2015  . Arteriosclerosis of coronary artery 05/04/2015  . Uncontrolled type 2 diabetes mellitus without complication, without long-term current use of insulin ([place]) 05/04/2015  . Dyslipidemia 05/04/2015  . Hypertension, benign 05/04/2015  . Gastro-esophageal reflux disease without esophagitis 05/04/2015  . Hiatal hernia 05/04/2015  . HZV (herpes zoster virus) post herpetic neuralgia 05/04/2015  . Vitamin D deficiency 05/04/2015  . S/P coronary artery stent placement 05/04/2015    Allergies  Allergen Reactions  . Ace Inhibitors     Other reaction(s): Cough  . Codeine Swelling  . Codeine Sulfate     Other reaction(s): Unknown  . Ezetimibe     Other reaction(s): Muscle Pain  . Other     Other reaction(s): Muscle Pain    Past Surgical History:  Procedure Laterality Date  . ABDOMINAL HYSTERECTOMY  1979  . APPENDECTOMY    . CARDIAC SURGERY    . CATARACT EXTRACTION W/PHACO Right 10/28/2017   Procedure: CATARACT EXTRACTION PHACO AND INTRAOCULAR LENS PLACEMENT (IOC);  Surgeon: Eulogio Bear, MD;  Location: [facility] ORS;  Service: Ophthalmology;  Laterality:  Right;  Lot # [id number] H [place]:  00:53.9 AP%: 10.6 CDE: 5.71  . CORONARY ANGIOPLASTY     STENT  . LAPAROTOMY N/A 10/04/2016   Procedure: EXPLORATORY LAPAROTOMY;  Surgeon: Dia Crawford III, MD;  Location: [facility] ORS;  Service: General;  Laterality: N/A;    Social History   Tobacco Use  . Smoking status: Former Smoker    Packs/day: 2.00    Years: 10.00    Pack years: 20.00    Types: Cigarettes    Last attempt to quit: 11/16/1988    Years since quitting: 30.0  . Smokeless tobacco: Never Used  Substance Use Topics  . Alcohol use: No    Alcohol/week: 0.0 standard drinks  . Drug use: No     Medication list has been reviewed and updated.  No outpatient medications have been marked as taking for the 11/24/18 encounter (Office Visit) with Juline Patch, MD.    [facility] 2/9 Scores 09/23/2018 07/05/2018 08/30/2017 08/30/2017  PHQ - 2 Score 0 4 0 0  PHQ- 9 Score 0 5 2 -    Physical Exam Vitals signs and nursing note reviewed.  Constitutional:      General: She is not in acute distress.    Appearance: She is not diaphoretic.  HENT:     Head: Normocephalic and atraumatic.     Right Ear: External ear normal.     Left Ear: External ear normal.     Nose: Nose normal.  Eyes:     General:        Right eye: No discharge.        Left eye: No discharge.     Conjunctiva/sclera: Conjunctivae normal.     Pupils: Pupils are equal, round, and reactive to light.  Neck:     Musculoskeletal: Normal range of motion and neck supple.     Thyroid: No thyromegaly.     Vascular: No JVD.  Cardiovascular:     Rate and Rhythm: Normal rate and regular rhythm.     Heart sounds: Normal heart sounds. No murmur. No friction rub. No gallop.   Pulmonary:     Effort: Pulmonary effort is normal.     Breath sounds: Normal breath sounds.  Abdominal:     General: Bowel sounds are normal.     Palpations: Abdomen is soft. There is no mass.     Tenderness: There is no abdominal tenderness. There is no guarding.  Musculoskeletal: Normal range of  motion.  Lymphadenopathy:     Cervical: No cervical adenopathy.  Skin:    General: Skin is warm and dry.  Neurological:     Mental Status: She is alert.     Deep Tendon Reflexes: Reflexes are normal and symmetric.     BP 130/70   Pulse 72   Ht 5\' 11"  (1.803 m)   Wt 166 lb (75.3 kg)   BMI 23.15 kg/m   Assessment and Plan:  1. Non-seasonal allergic rhinitis, unspecified trigger Recurrent cute exacerbation of allergic rhinitis will continue Singulair 1 a day and prescribed Nasacort 2 sprays in each nostril once a day and Claritin 10 mg once a day - triamcinolone (NASACORT) 55 MCG/ACT AERO nasal inhaler; Place 2 sprays into the nose daily.  Dispense: 1 Inhaler; Refill: 12 - loratadine (CLARITIN) 10 MG tablet; Take 1 tablet (10 mg total) by mouth daily.  Dispense: 30 tablet; Refill: 11  2. Viral upper respiratory tract infection Viral respiratory illness continue Singulair and will symptoms with Nasacort and Claritin. -  triamcinolone (NASACORT) 55 MCG/ACT AERO nasal inhaler; Place 2 sprays into the nose daily.  Dispense: 1 Inhaler; Refill: 12 - loratadine (CLARITIN) 10 MG tablet; Take 1 tablet (10 mg total) by mouth daily.  Dispense: 30 tablet; Refill: 11  3. Arteriosclerosis of coronary artery 30 days Pravachol given until patient can come in for medication refill visit - pravastatin (PRAVACHOL) 40 MG tablet; Take 1 tablet (40 mg total) by mouth daily.  Dispense: 30 tablet; Refill: 0  4. Dyslipidemia 30-day Pravachol called in until patient can come in for medication refill visit - pravastatin (PRAVACHOL) 40 MG tablet; Take 1 tablet (40 mg total) by mouth daily.  Dispense: 30 tablet; Refill: 0  5. Hypertension, benign 30-day losartan 50 mg called in until patient come in for [place] visit - losartan (COZAAR) 50 MG tablet; Take 1 tablet (50 mg total) by mouth daily.  Dispense: 30 tablet; Refill: 0  6. Overactive bladder And having urinary frequency urinalysis was negative likely  overactive bladder. - POCT urinalysis dipstick

## 2018-11-28 ENCOUNTER — Emergency Department: Payer: 59

## 2018-11-28 ENCOUNTER — Emergency Department
Admission: EM | Admit: 2018-11-28 | Discharge: 2018-11-28 | Discharge status: Against Medical Advice | Payer: 59 | Attending: Emergency Medicine | Admitting: Emergency Medicine

## 2018-11-28 ENCOUNTER — Other Ambulatory Visit: Payer: Self-pay | Admitting: Family Medicine

## 2018-11-28 ENCOUNTER — Other Ambulatory Visit: Payer: Self-pay

## 2018-11-28 ENCOUNTER — Encounter: Payer: Self-pay | Admitting: Emergency Medicine

## 2018-11-28 DIAGNOSIS — Z5321 Procedure and treatment not carried out due to patient leaving prior to being seen by health care provider: Secondary | ICD-10-CM | POA: Diagnosis not present

## 2018-11-28 DIAGNOSIS — R079 Chest pain, unspecified: Secondary | ICD-10-CM | POA: Insufficient documentation

## 2018-11-28 DIAGNOSIS — Z1231 Encounter for screening mammogram for malignant neoplasm of breast: Secondary | ICD-10-CM

## 2018-11-28 LAB — CBC
HCT: 39.4 % (ref 36.0–46.0)
HEMOGLOBIN: 12.7 g/dL (ref 12.0–15.0)
MCH: 30 pg (ref 26.0–34.0)
MCHC: 32.2 g/dL (ref 30.0–36.0)
MCV: 93.1 fL (ref 80.0–100.0)
Platelets: 208 10*3/uL (ref 150–400)
RBC: 4.23 MIL/uL (ref 3.87–5.11)
RDW: 14.1 % (ref 11.5–15.5)
WBC: 7.4 10*3/uL (ref 4.0–10.5)
nRBC: 0 % (ref 0.0–0.2)

## 2018-11-28 LAB — COMPREHENSIVE METABOLIC PANEL
ALT: 21 U/L (ref 0–44)
AST: 25 U/L (ref 15–41)
Albumin: 4.4 g/dL (ref 3.5–5.0)
Alkaline Phosphatase: 67 U/L (ref 38–126)
Anion gap: 7 (ref 5–15)
BILIRUBIN TOTAL: 0.4 mg/dL (ref 0.3–1.2)
BUN: 21 mg/dL (ref 8–23)
CALCIUM: 9.5 mg/dL (ref 8.9–10.3)
CO2: 26 mmol/L (ref 22–32)
CREATININE: 1.16 mg/dL — AB (ref 0.44–1.00)
Chloride: 107 mmol/L (ref 98–111)
GFR, EST AFRICAN AMERICAN: 53 mL/min — AB (ref >60)
GFR, EST NON AFRICAN AMERICAN: 45 mL/min — AB (ref >60)
Glucose, Bld: 133 mg/dL — ABNORMAL HIGH (ref 70–99)
Potassium: 4.2 mmol/L (ref 3.5–5.1)
Sodium: 140 mmol/L (ref 135–145)
TOTAL PROTEIN: 7.3 g/dL (ref 6.5–8.1)

## 2018-11-28 LAB — TROPONIN I: Troponin I: <0.03 ng/mL (ref <0.03)

## 2018-11-28 NOTE — ED Triage Notes (Signed)
States about 11 am after [facility] started L sided chest pain. States has subsided now.

## 2018-11-29 DIAGNOSIS — I10 Essential (primary) hypertension: Secondary | ICD-10-CM | POA: Diagnosis not present

## 2018-11-29 DIAGNOSIS — E782 Mixed hyperlipidemia: Secondary | ICD-10-CM | POA: Diagnosis not present

## 2018-11-29 DIAGNOSIS — I25118 Atherosclerotic heart disease of native coronary artery with other forms of angina pectoris: Secondary | ICD-10-CM | POA: Diagnosis not present

## 2018-11-29 DIAGNOSIS — E118 Type 2 diabetes mellitus with unspecified complications: Secondary | ICD-10-CM | POA: Diagnosis not present

## 2018-11-29 DIAGNOSIS — N183 Chronic kidney disease, stage 3 (moderate): Secondary | ICD-10-CM | POA: Diagnosis not present

## 2018-12-13 ENCOUNTER — Telehealth: Payer: Self-pay | Admitting: Family Medicine

## 2018-12-13 NOTE — Telephone Encounter (Signed)
Patient called in to request pills for her blood pressure til her next appointment.

## 2018-12-13 NOTE — Telephone Encounter (Signed)
Her losartan was sent in on Jan 9- need to see at upcoming appt, but that should get her through until the appt. She is on the schedule for this week

## 2018-12-15 ENCOUNTER — Ambulatory Visit: Payer: 59 | Admitting: Family Medicine

## 2018-12-16 ENCOUNTER — Other Ambulatory Visit: Payer: Self-pay

## 2018-12-16 MED ORDER — AMLODIPINE BESYLATE 5 MG PO TABS: 5.0000 mg | ORAL_TABLET | Freq: Every day | ORAL | 0 refills | Status: DC

## 2018-12-21 ENCOUNTER — Ambulatory Visit: Payer: 59 | Admitting: Family Medicine

## 2018-12-23 ENCOUNTER — Ambulatory Visit: Payer: 59 | Admitting: Family Medicine

## 2018-12-23 ENCOUNTER — Encounter: Payer: Self-pay | Admitting: Family Medicine

## 2018-12-23 ENCOUNTER — Ambulatory Visit (INDEPENDENT_AMBULATORY_CARE_PROVIDER_SITE_OTHER): Payer: 59 | Admitting: Family Medicine

## 2018-12-23 VITALS — BP 128/58 | HR 76 | Ht 71.0 in | Wt 165.0 lb

## 2018-12-23 DIAGNOSIS — I10 Essential (primary) hypertension: Secondary | ICD-10-CM | POA: Diagnosis not present

## 2018-12-23 DIAGNOSIS — IMO0001 Reserved for inherently not codable concepts without codable children: Secondary | ICD-10-CM

## 2018-12-23 DIAGNOSIS — E782 Mixed hyperlipidemia: Secondary | ICD-10-CM | POA: Diagnosis not present

## 2018-12-23 DIAGNOSIS — I2510 Atherosclerotic heart disease of native coronary artery without angina pectoris: Secondary | ICD-10-CM

## 2018-12-23 DIAGNOSIS — I700 Atherosclerosis of aorta: Secondary | ICD-10-CM | POA: Diagnosis not present

## 2018-12-23 DIAGNOSIS — E1165 Type 2 diabetes mellitus with hyperglycemia: Secondary | ICD-10-CM | POA: Diagnosis not present

## 2018-12-23 MED ORDER — AMLODIPINE BESYLATE 5 MG PO TABS: 5.0000 mg | ORAL_TABLET | Freq: Every day | ORAL | 1 refills | Status: DC

## 2018-12-23 MED ORDER — LOSARTAN POTASSIUM 50 MG PO TABS: 50.0000 mg | ORAL_TABLET | Freq: Every day | ORAL | 1 refills | Status: AC

## 2018-12-23 MED ORDER — SITAGLIPTIN PHOSPHATE 100 MG PO TABS: 100.0000 mg | ORAL_TABLET | Freq: Every day | ORAL | 0 refills | Status: DC

## 2018-12-23 NOTE — Progress Notes (Signed)
Date:  12/23/2018   Name:  Breanna Petty   DOB:  [birth year]   MRN:  [id number]   Chief Complaint: Follow-up (pt has been on amlodipine and losartan) and Diabetes (A1C needed)  Diabetes  She presents for her follow-up diabetic visit. She has type 2 diabetes mellitus. Her disease course has been stable. There are no hypoglycemic associated symptoms. Pertinent negatives for hypoglycemia include no confusion, dizziness, headaches, hunger, mood changes, nervousness/anxiousness, pallor, seizures, sleepiness, speech difficulty, sweats or tremors. There are no diabetic associated symptoms. Pertinent negatives for diabetes include no blurred vision, no chest pain, no fatigue, no foot paresthesias, no foot ulcerations, no polydipsia, no polyphagia, no polyuria, no visual change, no weakness and no weight loss. There are no hypoglycemic complications. Symptoms are stable. There are no diabetic complications. There are no known risk factors for coronary artery disease. When asked about current treatments, none were reported. Her weight is stable. She is following a generally healthy diet. She participates in exercise intermittently (walks alot). There is no change in her home blood glucose trend. Her breakfast blood glucose is taken between 8-9 am. Her breakfast blood glucose range is generally 110-130 mg/dl. An ACE inhibitor/angiotensin II receptor blocker is being taken.    Review of Systems  Constitutional: Negative.  Negative for chills, fatigue, fever, unexpected weight change and weight loss.  HENT: Negative for congestion, ear discharge, ear pain, rhinorrhea, sinus pressure, sneezing and sore throat.   Eyes: Negative for blurred vision, photophobia, pain, discharge, redness and itching.  Respiratory: Negative for cough, shortness of breath, wheezing and stridor.   Cardiovascular: Negative for chest pain.  Gastrointestinal: Negative for abdominal pain, blood in stool, constipation, diarrhea, nausea  and vomiting.  Endocrine: Negative for cold intolerance, heat intolerance, polydipsia, polyphagia and polyuria.  Genitourinary: Negative for dysuria, flank pain, frequency, hematuria, menstrual problem, pelvic pain, urgency, vaginal bleeding and vaginal discharge.  Musculoskeletal: Negative for arthralgias, back pain and myalgias.  Skin: Negative for pallor and rash.  Allergic/Immunologic: Negative for environmental allergies and food allergies.  Neurological: Negative for dizziness, tremors, seizures, speech difficulty, weakness, light-headedness, numbness and headaches.  Hematological: Negative for adenopathy. Does not bruise/bleed easily.  Psychiatric/Behavioral: Negative for confusion and dysphoric mood. The patient is not nervous/anxious.     Patient Active Problem List   Diagnosis Date Noted  . Status post exploratory laparotomy 10/21/2016  . Myocardial infarction ([place]) 10/20/2016  . SBO (small bowel obstruction) ([place])   . Aortic atherosclerosis ([place]) 10/02/2016  . Diverticulosis of large intestine without hemorrhage 10/02/2016  . Acute diverticulitis 10/02/2016  . Hammer toe of right foot 10/16/2015  . Nail deformity 10/16/2015  . Chronic obstructive pulmonary disease ([place]) 09/04/2015  . History of MI (myocardial infarction) 09/04/2015  . Spondylolisthesis at L4-L5 level 07/11/2015  . DDD (degenerative disc disease), lumbar 07/11/2015  . History of sarcoidosis 06/04/2015  . Urge incontinence of urine 06/04/2015  . Hearing loss of both ears 05/07/2015  . Anxiety and depression 05/04/2015  . Arteriosclerosis of coronary artery 05/04/2015  . Uncontrolled type 2 diabetes mellitus without complication, without long-term current use of insulin ([place]) 05/04/2015  . Dyslipidemia 05/04/2015  . Hypertension, benign 05/04/2015  . Gastro-esophageal reflux disease without esophagitis 05/04/2015  . Hiatal hernia 05/04/2015  . HZV (herpes zoster virus) post herpetic neuralgia 05/04/2015  .  Vitamin D deficiency 05/04/2015  . S/P coronary artery stent placement 05/04/2015    Allergies  Allergen Reactions  . Ace Inhibitors     Other  reaction(s): Cough  . Codeine Swelling  . Codeine Sulfate     Other reaction(s): Unknown  . Ezetimibe     Other reaction(s): Muscle Pain  . Other     Other reaction(s): Muscle Pain    Past Surgical History:  Procedure Laterality Date  . ABDOMINAL HYSTERECTOMY  1979  . APPENDECTOMY    . CARDIAC SURGERY    . CATARACT EXTRACTION W/PHACO Right 10/28/2017   Procedure: CATARACT EXTRACTION PHACO AND INTRAOCULAR LENS PLACEMENT (IOC);  Surgeon: Eulogio Bear, MD;  Location: [facility] ORS;  Service: Ophthalmology;  Laterality: Right;  Lot # [id number] H [place]: 00:53.9 AP%: 10.6 CDE: 5.71  . CORONARY ANGIOPLASTY     STENT  . LAPAROTOMY N/A 10/04/2016   Procedure: EXPLORATORY LAPAROTOMY;  Surgeon: Dia Crawford III, MD;  Location: [facility] ORS;  Service: General;  Laterality: N/A;    Social History   Tobacco Use  . Smoking status: Former Smoker    Packs/day: 2.00    Years: 10.00    Pack years: 20.00    Types: Cigarettes    Last attempt to quit: 11/16/1988    Years since quitting: 30.1  . Smokeless tobacco: Never Used  Substance Use Topics  . Alcohol use: No    Alcohol/week: 0.0 standard drinks  . Drug use: No     Medication list has been reviewed and updated.  Current Meds  Medication Sig  . amLODipine (NORVASC) 5 MG tablet Take 1 tablet (5 mg total) by mouth daily.  Marland Kitchen aspirin EC 81 MG tablet Take 1 tablet (81 mg total) by mouth daily.  Marland Kitchen glucose blood (FREESTYLE LITE) test strip Test once a day  . latanoprost (XALATAN) 0.005 % ophthalmic solution Place 1 drop into both eyes at bedtime.   Marland Kitchen loratadine (CLARITIN) 10 MG tablet Take 1 tablet (10 mg total) by mouth daily.  Marland Kitchen losartan (COZAAR) 50 MG tablet Take 1 tablet (50 mg total) by mouth daily.  . montelukast (SINGULAIR) 10 MG tablet Take 1 tablet (10 mg total) by mouth at bedtime.  . Multiple  Vitamin (MULTIVITAMIN) capsule Take 1 capsule by mouth daily.  Marland Kitchen NITROSTAT 0.4 MG SL tablet DISSOLVE ONE TABLET UNDER TONGUE AS NEED FOR CHEST PAIN AS DIRECTED  . nortriptyline (PAMELOR) 10 MG capsule Take 1 capsule (10 mg total) by mouth at bedtime.  Marland Kitchen omeprazole (PRILOSEC) 20 MG capsule One twice a day  . pravastatin (PRAVACHOL) 40 MG tablet Take 1 tablet (40 mg total) by mouth daily.  . sitaGLIPtin (JANUVIA) 100 MG tablet Take 1 tablet (100 mg total) by mouth daily.  Marland Kitchen triamcinolone (NASACORT) 55 MCG/ACT AERO nasal inhaler Place 2 sprays into the nose daily.    PHQ 2/9 Scores 09/23/2018 07/05/2018 08/30/2017 08/30/2017  PHQ - 2 Score 0 4 0 0  PHQ- 9 Score 0 5 2 -    Physical Exam Vitals signs and nursing note reviewed.  Constitutional:      General: She is not in acute distress.    Appearance: She is not diaphoretic.  HENT:     Head: Normocephalic and atraumatic.     Right Ear: External ear normal.     Left Ear: External ear normal.     Nose: Nose normal.  Eyes:     General:        Right eye: No discharge.        Left eye: No discharge.     Conjunctiva/sclera: Conjunctivae normal.     Pupils: Pupils are equal, round, and reactive  to light.  Neck:     Musculoskeletal: Normal range of motion and neck supple.     Thyroid: No thyromegaly.     Vascular: No JVD.  Cardiovascular:     Rate and Rhythm: Normal rate and regular rhythm.     Heart sounds: Normal heart sounds. No murmur. No friction rub. No gallop.   Pulmonary:     Effort: Pulmonary effort is normal.     Breath sounds: Normal breath sounds.  Abdominal:     General: Bowel sounds are normal.     Palpations: Abdomen is soft. There is no mass.     Tenderness: There is no abdominal tenderness. There is no guarding.  Musculoskeletal: Normal range of motion.  Lymphadenopathy:     Cervical: No cervical adenopathy.  Skin:    General: Skin is warm and dry.  Neurological:     Mental Status: She is alert.     Deep Tendon  Reflexes: Reflexes are normal and symmetric.     BP (!) 128/58   Pulse 76   Ht 5\' 11"  (1.803 m)   Wt 165 lb (74.8 kg)   BMI 23.01 kg/m   Assessment and Plan: 1. Hypertension, benign Chronic. Controlled on meds- refill losartan and continue Amlodipine. Patient was instructed to bring meds in at each visit. - losartan (COZAAR) 50 MG tablet; Take 1 tablet (50 mg total) by mouth daily.  Dispense: 90 tablet; Refill: 1  2. Uncontrolled type 2 diabetes mellitus without complication, without long-term current use of insulin (HCC) Chronic. Controlled on januvia. BS this morning was 125 per patient. Continue Januvia and draw A1C and lipid panel. - Hemoglobin A1c - Lipid panel - sitaGLIPtin (JANUVIA) 100 MG tablet; Take 1 tablet (100 mg total) by mouth daily.  Dispense: 90 tablet; Refill: 0  3. Aortic atherosclerosis (HCC) Chronic. Controlled on aspirin. Continue otc dosing.  4. Mixed hyperlipidemia Chronic. Controlled on Pravastatin. Draw lipid panel - Lipid panel

## 2018-12-24 LAB — HEMOGLOBIN A1C
Est. average glucose Bld gHb Est-mCnc: 146 mg/dL
Hgb A1c MFr Bld: 6.7 % — ABNORMAL HIGH (ref 4.8–5.6)

## 2018-12-24 LAB — LIPID PANEL
CHOL/HDL RATIO: 2.2 ratio (ref 0.0–4.4)
Cholesterol, Total: 205 mg/dL — ABNORMAL HIGH (ref 100–199)
HDL: 93 mg/dL (ref >39)
LDL Calculated: 99 mg/dL (ref 0–99)
Triglycerides: 65 mg/dL (ref 0–149)
VLDL Cholesterol Cal: 13 mg/dL (ref 5–40)

## 2018-12-28 ENCOUNTER — Ambulatory Visit
Admission: RE | Admit: 2018-12-28 | Discharge: 2018-12-28 | Disposition: A | Discharge status: Home / Self Care | Payer: 59 | Source: Ambulatory Visit | Attending: Family Medicine | Admitting: Family Medicine

## 2018-12-28 DIAGNOSIS — Z1231 Encounter for screening mammogram for malignant neoplasm of breast: Secondary | ICD-10-CM | POA: Insufficient documentation

## 2019-01-02 DIAGNOSIS — M79671 Pain in right foot: Secondary | ICD-10-CM | POA: Diagnosis not present

## 2019-01-02 DIAGNOSIS — M7671 Peroneal tendinitis, right leg: Secondary | ICD-10-CM | POA: Diagnosis not present

## 2019-01-02 DIAGNOSIS — B351 Tinea unguium: Secondary | ICD-10-CM | POA: Diagnosis not present

## 2019-01-02 DIAGNOSIS — M79672 Pain in left foot: Secondary | ICD-10-CM | POA: Diagnosis not present

## 2019-01-17 ENCOUNTER — Other Ambulatory Visit: Payer: Self-pay | Admitting: Family Medicine

## 2019-02-27 ENCOUNTER — Ambulatory Visit: Payer: 59 | Admitting: Family Medicine

## 2019-03-16 ENCOUNTER — Ambulatory Visit (INDEPENDENT_AMBULATORY_CARE_PROVIDER_SITE_OTHER): Payer: 59 | Admitting: Family Medicine

## 2019-03-16 ENCOUNTER — Encounter: Payer: Self-pay | Admitting: Family Medicine

## 2019-03-16 ENCOUNTER — Other Ambulatory Visit: Payer: Self-pay

## 2019-03-16 VITALS — BP 134/80 | HR 76 | Temp 98.0°F | Resp 16 | Ht 71.0 in | Wt 162.6 lb

## 2019-03-16 DIAGNOSIS — I10 Essential (primary) hypertension: Secondary | ICD-10-CM

## 2019-03-16 DIAGNOSIS — I700 Atherosclerosis of aorta: Secondary | ICD-10-CM | POA: Diagnosis not present

## 2019-03-16 DIAGNOSIS — Z862 Personal history of diseases of the blood and blood-forming organs and certain disorders involving the immune mechanism: Secondary | ICD-10-CM

## 2019-03-16 DIAGNOSIS — K219 Gastro-esophageal reflux disease without esophagitis: Secondary | ICD-10-CM

## 2019-03-16 DIAGNOSIS — Z955 Presence of coronary angioplasty implant and graft: Secondary | ICD-10-CM | POA: Diagnosis not present

## 2019-03-16 DIAGNOSIS — I252 Old myocardial infarction: Secondary | ICD-10-CM

## 2019-03-16 DIAGNOSIS — Z598 Other problems related to housing and economic circumstances: Secondary | ICD-10-CM

## 2019-03-16 DIAGNOSIS — I2510 Atherosclerotic heart disease of native coronary artery without angina pectoris: Secondary | ICD-10-CM | POA: Diagnosis not present

## 2019-03-16 DIAGNOSIS — E785 Hyperlipidemia, unspecified: Secondary | ICD-10-CM | POA: Diagnosis not present

## 2019-03-16 DIAGNOSIS — J449 Chronic obstructive pulmonary disease, unspecified: Secondary | ICD-10-CM | POA: Diagnosis not present

## 2019-03-16 DIAGNOSIS — R809 Proteinuria, unspecified: Secondary | ICD-10-CM | POA: Diagnosis not present

## 2019-03-16 DIAGNOSIS — F32A Depression, unspecified: Secondary | ICD-10-CM

## 2019-03-16 DIAGNOSIS — E1129 Type 2 diabetes mellitus with other diabetic kidney complication: Secondary | ICD-10-CM

## 2019-03-16 DIAGNOSIS — F329 Major depressive disorder, single episode, unspecified: Secondary | ICD-10-CM

## 2019-03-16 DIAGNOSIS — Z599 Problem related to housing and economic circumstances, unspecified: Secondary | ICD-10-CM

## 2019-03-16 DIAGNOSIS — F419 Anxiety disorder, unspecified: Secondary | ICD-10-CM

## 2019-03-16 MED ORDER — FAMOTIDINE 20 MG PO TABS: 20.0000 mg | ORAL_TABLET | ORAL | 1 refills | Status: DC

## 2019-03-16 NOTE — Progress Notes (Signed)
Name: Breanna Petty   MRN: [id number]    DOB: Nov 02, 1941   Date:03/16/2019       Progress Note  Subjective  Chief Complaint  Chief Complaint  Patient presents with  . Establish Care  . Hypertension  . 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  Her family is all living close by.  She only has one niece that checks on her, otherwise she doesn't have much social support.   HTN: At Goal Today -does take medications as prescribed - current regimen includes Amlodipine 5mg  and Losartan 50mg  - taking medications as instructed, no medication side effects noted, no TIAs, no chest pain on exertion, no dyspnea on exertion, no swelling of ankles, no orthostatic dizziness or lightheadedness, no orthopnea or paroxysmal nocturnal dyspnea - DASH diet discussed - pt does follow a low sodium diet; salt not added to cooking and salt shaker not on table   CAD/Aortic Atherosclerosis/S/p Stent Placement/Hx MI: She eats fried foods frequently, but over the last month or so, she has really decreased this.  She takes pravastatin and aspirin daily; has nitro PRN on hand, but has not used in years.  Sees Dr. Nehemiah Massed - last visit was January 2020.  Denies any recent episodes of chest pain, shortness of breath  Diabetes mellitus type 2 Checking sugars?  yes How often? Daily Range (low to high) over last two weeks:  Today: 118; Last 2 weeks low-high is 101-134. Does patient feel additional teaching/training would be helpful?  no  Trying to limit white bread, white rice, white potatoes, sweets?  yes Trying to limit sweetened drinks like iced tea, soft drinks, sports drinks, fruit juices?  yes Checking feet every day/night?  No - education provided Last eye exam:  Has one coming up this month - [place]  Denies: Polyuria, polydipsia, polyphagia,  vision changes, or neuropathy. Most recent A1C:  Lab Results  Component Value Date   HGBA1C 6.7 (H) 12/23/2018    We will recheck today. Last CMP Results : is not due for repeat today Urine Micro UTD? No Current Medication Management: Diabetic Medications:  ACEI/ARB: Yes Statin: Yes Aspirin therapy: Yes  GERD: She is asymptomatic. She has been taking omeprazole for quite some time, and has not had any symptoms in many many months.  Discussed risks of long-term PPI use, and she would like to stop the medication. No regurgitation.  Sarcoidosis:  She has not had any issues - used to get soreness in her bilateral shoulders, but has not had any pain in many years.  Last CT chest reviewed from 2013.  History Depression/Anxiety: She states she has never dealt with stressful things well. Currently she has little social support, her home has mold and is leaking.  She has taken medication in the past, but never took it consistently.  She is working on reading or listening to her Bible to help her calm down when she is anxious.  She struggles with feeling safe in her home and when she is out and about.  She declines medication today - did discuss Buspar as an option in the future.    Office Visit from 03/16/2019 in [facility]  PHQ-9 Total Score  0     GAD 7 : Generalized Anxiety Score 03/16/2019  Nervous, Anxious, on Edge 1  Control/stop worrying 0  Worry too much - different things 0  Trouble relaxing 0  Restless 0  Easily annoyed or irritable 1  Afraid - awful might happen 1  Total GAD 7 Score 3  Anxiety Difficulty Somewhat difficult   Patient Active Problem List   Diagnosis Date Noted  . Status post exploratory laparotomy 10/21/2016  . Myocardial infarction ([place]) 10/20/2016  . SBO (small bowel obstruction) ([place])   . Aortic atherosclerosis ([place]) 10/02/2016  . Diverticulosis of large intestine without hemorrhage 10/02/2016  . Hammer toe of right foot 10/16/2015  .  Nail deformity 10/16/2015  . Chronic obstructive pulmonary disease ([place]) 09/04/2015  . History of MI (myocardial infarction) 09/04/2015  . Spondylolisthesis at L4-L5 level 07/11/2015  . DDD (degenerative disc disease), lumbar 07/11/2015  . History of sarcoidosis 06/04/2015  . Urge incontinence of urine 06/04/2015  . Hearing loss of both ears 05/07/2015  . Anxiety and depression 05/04/2015  . Arteriosclerosis of coronary artery 05/04/2015  . Type 2 diabetes mellitus with microalbuminuria, without long-term current use of insulin ([place]) 05/04/2015  . Dyslipidemia 05/04/2015  . Hypertension, benign 05/04/2015  . Gastro-esophageal reflux disease without esophagitis 05/04/2015  . Hiatal hernia 05/04/2015  . HZV (herpes zoster virus) post herpetic neuralgia 05/04/2015  . Vitamin D deficiency 05/04/2015  . S/P coronary artery stent placement 05/04/2015    Past Surgical History:  Procedure Laterality Date  . ABDOMINAL HYSTERECTOMY  1979  . APPENDECTOMY    . CARDIAC SURGERY    . CATARACT EXTRACTION W/PHACO Right 10/28/2017   Procedure: CATARACT EXTRACTION PHACO AND INTRAOCULAR LENS PLACEMENT (IOC);  Surgeon: Eulogio Bear, MD;  Location: [facility] ORS;  Service: Ophthalmology;  Laterality: Right;  Lot # [id number] H [place]: 00:53.9 AP%: 10.6 CDE: 5.71  . CORONARY ANGIOPLASTY     STENT  . LAPAROTOMY N/A 10/04/2016   Procedure: EXPLORATORY LAPAROTOMY;  Surgeon: Dia Crawford III, MD;  Location: [facility] ORS;  Service: General;  Laterality: N/A;    Family History  Problem Relation Age of Onset  . Diabetes Daughter   . Hypertension Mother   . Diabetes Son   . Breast cancer Neg Hx     Social History   Socioeconomic History  . Marital status: Single    Spouse name: Not on file  . Number of children: 5  . Years of education: Not on file  . Highest education level: Not on file  Occupational History  . Not on file  Social Needs  . Financial resource strain: Not hard at all  . Food insecurity:     Worry: Never true    Inability: Never true  . Transportation needs:    Medical: No    Non-medical: No  Tobacco Use  . Smoking status: Former Smoker    Packs/day: 2.00    Years: 10.00    Pack years: 20.00    Types: Cigarettes    Last attempt to quit: 11/16/1988    Years since quitting: 30.3  . Smokeless tobacco: Never Used  Substance and Sexual Activity  . Alcohol use: No    Alcohol/week: 0.0 standard drinks  . Drug use: No  . Sexual activity: Not Currently  Lifestyle  . Physical activity:    Days per week: 0 days    Minutes per session: 0 min  . Stress: Not at all  Relationships  . Social connections:    Talks on phone: Twice a week    Gets together: Twice a week    Attends religious service:  More than 4 times per year    Active member of club or organization: Not on file    Attends meetings of clubs or organizations: Never    Relationship status: Never married  . Intimate partner violence:    Fear of current or ex partner: No    Emotionally abused: No    Physically abused: No    Forced sexual activity: No  Other Topics Concern  . Not on file  Social History Narrative  . Not on file    Current Outpatient Medications:  .  amLODipine (NORVASC) 5 MG tablet, Take 1 tablet (5 mg total) by mouth daily., Disp: 90 tablet, Rfl: 1 .  aspirin EC 81 MG tablet, Take 1 tablet (81 mg total) by mouth daily., Disp: 90 tablet, Rfl: 3 .  glucose blood test strip, TEST ONCE A DAY, Disp: 100 each, Rfl: 0 .  latanoprost (XALATAN) 0.005 % ophthalmic solution, Place 1 drop into both eyes at bedtime. , Disp: , Rfl:  .  losartan (COZAAR) 50 MG tablet, Take 1 tablet (50 mg total) by mouth daily., Disp: 90 tablet, Rfl: 1 .  Multiple Vitamin (MULTIVITAMIN) capsule, Take 1 capsule by mouth daily., Disp: , Rfl:  .  NITROSTAT 0.4 MG SL tablet, DISSOLVE ONE TABLET UNDER TONGUE AS NEED FOR CHEST PAIN AS DIRECTED, Disp: 30 tablet, Rfl: 0 .  omeprazole (PRILOSEC) 20 MG capsule, One twice a day, Disp:  180 capsule, Rfl: 3 .  pravastatin (PRAVACHOL) 40 MG tablet, Take 1 tablet (40 mg total) by mouth daily., Disp: 30 tablet, Rfl: 0 .  sitaGLIPtin (JANUVIA) 100 MG tablet, Take 1 tablet (100 mg total) by mouth daily., Disp: 90 tablet, Rfl: 0 .  famotidine (PEPCID) 20 MG tablet, Take 1 tablet (20 mg total) by mouth every morning., Disp: 90 tablet, Rfl: 1  Allergies  Allergen Reactions  . Ace Inhibitors     Other reaction(s): Cough  . Codeine Swelling  . Codeine Sulfate     Other reaction(s): Unknown  . Ezetimibe     Other reaction(s): Muscle Pain  . Other     Other reaction(s): Muscle Pain    I personally reviewed active problem list, medication list, allergies, health maintenance, notes from last encounter, lab results with the patient/caregiver today.   ROS Constitutional: Negative for fever or weight change.  Respiratory: Negative for cough and shortness of breath.   Cardiovascular: Negative for chest pain or palpitations.  Gastrointestinal: Negative for abdominal pain, no bowel changes.  Musculoskeletal: Negative for gait problem or joint swelling.  Skin: Negative for rash.  Neurological: Negative for dizziness or headache.  No other specific complaints in a complete review of systems (except as listed in HPI above).  Objective  Vitals:   03/16/19 0753  BP: 134/80  Pulse: 76  Resp: 16  Temp: 98 F (36.7 C)  TempSrc: Oral  SpO2: 98%  Weight: 162 lb 9.6 oz (73.8 kg)  Height: 5\' 11"  (1.803 m)    Body mass index is 22.68 kg/m.  Physical Exam Constitutional: Patient appears well-developed and well-nourished. No distress.  HENT: Head: Normocephalic and atraumatic. Eyes: Conjunctivae and EOM are normal. No scleral icterus. Neck: Normal range of motion. Neck supple. No JVD present. No thyromegaly present.  Cardiovascular: Normal rate, regular rhythm and normal heart sounds.  No murmur heard. No BLE edema. Pulmonary/Chest: Effort normal and breath sounds normal. No  respiratory distress. Musculoskeletal: Normal range of motion, no joint effusions. No gross deformities Neurological: Pt is alert  and oriented to person, place, and time. No cranial nerve deficit. Coordination, balance, strength, speech and gait are normal.  Skin: Skin is warm and dry. No rash noted. No erythema.  Psychiatric: Patient has a normal mood and affect. behavior is normal. Judgment and thought content normal.  No results found for this or any previous visit (from the past 72 hour(s)).  Diabetic Foot Exam: Diabetic Foot Exam - Simple   Simple Foot Form Diabetic Foot exam was performed with the following findings:  Yes 03/16/2019  8:50 AM  Visual Inspection No deformities, no ulcerations, no other skin breakdown bilaterally:  Yes Sensation Testing Intact to touch and monofilament testing bilaterally:  Yes Pulse Check Posterior Tibialis and Dorsalis pulse intact bilaterally:  Yes Comments Bilateral feet have thickened, hyperpigmented toenails    PHQ2/9: Depression screen [facility] 2/9 03/16/2019 09/23/2018 07/05/2018 08/30/2017 08/30/2017  Decreased Interest 0 0 2 0 0  Down, Depressed, Hopeless 0 0 2 0 0  PHQ - 2 Score 0 0 4 0 0  Altered sleeping 0 0 1 0 -  Tired, decreased energy 0 0 0 1 -  Change in appetite 0 0 0 1 -  Feeling bad or failure about yourself  0 0 0 0 -  Trouble concentrating 0 0 0 0 -  Moving slowly or fidgety/restless 0 0 0 0 -  Suicidal thoughts 0 0 0 0 -  PHQ-9 Score 0 0 5 2 -  Difficult doing work/chores Not difficult at all - Not difficult at all Not difficult at all -   PHQ-2/9 Result is negative.    Fall Risk: Fall Risk  03/16/2019 09/23/2018 08/30/2017 08/11/2017 06/18/2017  Falls in the past year? 0 0 No No No  Number falls in past yr: 0 - - - -  Injury with Fall? 0 - - - -  Comment - - - - -  Risk for fall due to : - - - - -  Follow up Falls evaluation completed Falls evaluation completed - - -    Assessment & Plan  1. Type 2 diabetes mellitus  with microalbuminuria, without long-term current use of insulin (HCC) - Appears to have been well controlled over the last year; needs urine micro and eye exam.  Continue Januvia. - Urine Microalbumin w/creat. ratio  2. Chronic obstructive pulmonary disease, unspecified COPD type ([place]) - States was unaware of diagnosis.   3. Hypertension, benign - DASH diet discussed.   4. Aortic atherosclerosis (HCC) - Statin therapy  5. Arteriosclerosis of coronary artery - Statin therapy, continue with Cardiology.  6. Dyslipidemia - Statin therapy  7. History of MI (myocardial infarction) - Has Nitro PRN; continue with cardiology  8. S/P coronary artery stent placement - Statin therapy, continue with Cardiology.  9. Gastro-esophageal reflux disease without esophagitis - Decrease omeprazole to QHS dosing, take pepcid QAM - famotidine (PEPCID) 20 MG tablet; Take 1 tablet (20 mg total) by mouth every morning.  Dispense: 90 tablet; Refill: 1  10. Anxiety and depression - lengthy discussion, does not want to start medications at this time, more socioeconomic stress than anything else - will send social work referral to see if we are able to help  11. History of sarcoidosis - No concerns at this time  12. Financial difficulties - Ambulatory referral to Chronic Care Management Services

## 2019-03-17 LAB — MICROALBUMIN / CREATININE URINE RATIO
Creatinine, Urine: 47 mg/dL (ref 20–275)
Microalb, Ur: <0.2 mg/dL

## 2019-03-23 ENCOUNTER — Telehealth: Payer: Self-pay | Admitting: Family Medicine

## 2019-03-23 NOTE — Telephone Encounter (Signed)
Left detailed voicemail, that it does not look like we are prescriber but it looks like she has refills and to call the pharmacy

## 2019-03-23 NOTE — Telephone Encounter (Signed)
Copied from [place] [phone]. Topic: Quick Communication - Rx Refill/Question >> Mar 23, 2019  8:42 AM Richardo Priest, NT wrote: Medication:  losartan (COZAAR) 50 MG tablet  Has the patient contacted their pharmacy? Yes patient was instructed to contact office.  Preferred Pharmacy (with phone number or street name):  [place], [place] [phone] (Phone) [phone] (Fax)  Agent: Please be advised that RX refills may take up to 3 business days. We ask that you follow-up with your pharmacy.

## 2019-03-27 ENCOUNTER — Ambulatory Visit: Payer: Self-pay

## 2019-03-27 NOTE — Chronic Care Management (AMB) (Signed)
   Chronic Care Management   Unsuccessful Call Note 03/27/2019 Name: [name] MRN: [id number] DOB: 05-29-1941  Breanna Petty is a 78 year old female who sees Raelyn Ensign, FNP for primary care. Breanna Petty asked the CCM team to consult the patient for CCM services including care coordination related to having mold in her home and needed resources for removal. Breanna Petty also states patient has financial barriers. Referral was placed 03/16/2019 during last office visit.   Was unable to reach patient via telephone today for introduction to CCM services. I have left HIPAA compliant voicemail asking patient to return my call. (unsuccessful outreach #1).   Plan: Will follow-up within 7 business days via telephone.      Ludie Hudon E. Rollene Rotunda, RN, BSN Nurse Care Coordinator [facility] / [facility] Care Management  [phone]

## 2019-03-29 ENCOUNTER — Other Ambulatory Visit: Payer: Self-pay | Admitting: Family Medicine

## 2019-03-29 DIAGNOSIS — IMO0001 Reserved for inherently not codable concepts without codable children: Secondary | ICD-10-CM

## 2019-04-04 ENCOUNTER — Other Ambulatory Visit: Payer: Self-pay | Admitting: Family Medicine

## 2019-04-04 DIAGNOSIS — IMO0001 Reserved for inherently not codable concepts without codable children: Secondary | ICD-10-CM

## 2019-04-04 MED ORDER — SITAGLIPTIN PHOSPHATE 100 MG PO TABS: 100.0000 mg | ORAL_TABLET | Freq: Every day | ORAL | 0 refills | Status: DC

## 2019-04-04 NOTE — Telephone Encounter (Signed)
Copied from [place] [phone]. Topic: Quick Communication - Rx Refill/Question >> Apr 04, 2019  8:32 AM Leward Quan A wrote: Medication: sitaGLIPtin (JANUVIA) 100 MG tablet  Patient is almost out only have one pill for 04/05/2019  Has the patient contacted their pharmacy? Yes.   (Agent: If no, request that the patient contact the pharmacy for the refill.) (Agent: If yes, when and what did the pharmacy advise?)  Preferred Pharmacy (with phone number or street name): [place], [place] [phone] (Phone) [phone] (Fax)    Agent: Please be advised that RX refills may take up to 3 business days. We ask that you follow-up with your pharmacy.

## 2019-04-05 ENCOUNTER — Ambulatory Visit: Payer: Self-pay

## 2019-04-05 DIAGNOSIS — E1129 Type 2 diabetes mellitus with other diabetic kidney complication: Secondary | ICD-10-CM

## 2019-04-05 DIAGNOSIS — J449 Chronic obstructive pulmonary disease, unspecified: Secondary | ICD-10-CM

## 2019-04-05 NOTE — Chronic Care Management (AMB) (Signed)
   Chronic Care Management   Unsuccessful Call Note 03/27/2019 Name: [name] MRN: [id number]       DOB: 1941-05-07  Breanna Petty is a 78 year old female who sees Raelyn Ensign, FNP for primary care. Ms. Britt Boozer the CCM team to consult the patient for CCM services including care coordination related to having mold in her home and needed resources for removal. Ms. Uvaldo Rising also states patient has financial barriers. Referral was placed4/30/2020 during last office visit.  Was unable to reach patient via telephone today forintroduction to CCM services. Ihave left HIPAA compliant voicemail asking patient to return my call. (unsuccessful outreach #2).  Plan: Will follow-up within 7business days via telephone.    Annalicia Renfrew E. Rollene Rotunda, RN, BSN Nurse Care Coordinator [facility] / [facility] Care Management  [phone]

## 2019-04-12 DIAGNOSIS — I10 Essential (primary) hypertension: Secondary | ICD-10-CM | POA: Diagnosis not present

## 2019-04-12 DIAGNOSIS — N183 Chronic kidney disease, stage 3 (moderate): Secondary | ICD-10-CM | POA: Diagnosis not present

## 2019-04-12 DIAGNOSIS — E782 Mixed hyperlipidemia: Secondary | ICD-10-CM | POA: Diagnosis not present

## 2019-04-12 DIAGNOSIS — I2510 Atherosclerotic heart disease of native coronary artery without angina pectoris: Secondary | ICD-10-CM | POA: Diagnosis not present

## 2019-04-13 ENCOUNTER — Ambulatory Visit: Payer: Self-pay

## 2019-04-13 DIAGNOSIS — E1129 Type 2 diabetes mellitus with other diabetic kidney complication: Secondary | ICD-10-CM

## 2019-04-13 DIAGNOSIS — J449 Chronic obstructive pulmonary disease, unspecified: Secondary | ICD-10-CM

## 2019-04-13 NOTE — Chronic Care Management (AMB) (Signed)
   Chronic Care Management   Unsuccessful Call Note 03/27/2019 Name:Breanna M FoustMRN: [id number] DOB: [birth year]  Breanna M. Foustis a 78 year old female who sees Raelyn Ensign, FNPfor primary care.Breanna Petty the CCM team to consult the patient forCCM services including care coordination related to having mold in her home and needed resources for removal. Breanna Petty also states patient has financial barriers.Referral was placed4/30/2020 during last office visit.  Was unable to reach patient via telephone today forintroduction to CCM services. Ihave left HIPAA compliant voicemail asking patient to return my call. (unsuccessful outreach #3).  Plan: Will close referral if patient does not respond within 7 days. Will be happy to assist/reopen referral if patient chooses to respond at a later time.   Laylani Pudwill E. Rollene Rotunda, RN, BSN Nurse Care Coordinator [facility] / [facility] Care Management  [phone]

## 2019-04-14 ENCOUNTER — Other Ambulatory Visit: Payer: Self-pay | Admitting: Family Medicine

## 2019-04-14 DIAGNOSIS — E785 Hyperlipidemia, unspecified: Secondary | ICD-10-CM

## 2019-04-14 DIAGNOSIS — I2510 Atherosclerotic heart disease of native coronary artery without angina pectoris: Secondary | ICD-10-CM

## 2019-04-14 NOTE — Telephone Encounter (Signed)
Copied from [place] [id number]. Topic: Quick Communication - Rx Refill/Question >> Apr 14, 2019  8:41 AM Keene Breath wrote: Medication: pravastatin (PRAVACHOL) 40 MG tablet  Patient called to request a refill for the above medication.  Patient stated she only has 1 pill left.  Preferred Pharmacy (with phone number or street name): [place], [place] [phone] (Phone) [phone] (Fax)

## 2019-04-21 ENCOUNTER — Other Ambulatory Visit: Payer: Self-pay | Admitting: Emergency Medicine

## 2019-04-21 ENCOUNTER — Other Ambulatory Visit: Payer: Self-pay

## 2019-04-21 DIAGNOSIS — E785 Hyperlipidemia, unspecified: Secondary | ICD-10-CM

## 2019-04-21 DIAGNOSIS — IMO0001 Reserved for inherently not codable concepts without codable children: Secondary | ICD-10-CM

## 2019-04-21 DIAGNOSIS — I2510 Atherosclerotic heart disease of native coronary artery without angina pectoris: Secondary | ICD-10-CM

## 2019-04-21 MED ORDER — PRAVASTATIN SODIUM 40 MG PO TABS: 40.0000 mg | ORAL_TABLET | Freq: Every day | ORAL | 1 refills | Status: DC

## 2019-04-21 MED ORDER — GLUCOSE BLOOD VI STRP: ORAL_STRIP | 0 refills | Status: DC

## 2019-04-21 MED ORDER — SITAGLIPTIN PHOSPHATE 100 MG PO TABS: 100.0000 mg | ORAL_TABLET | Freq: Every day | ORAL | 0 refills | Status: DC

## 2019-04-21 NOTE — Telephone Encounter (Signed)
Refill is provided.

## 2019-04-24 DIAGNOSIS — H401132 Primary open-angle glaucoma, bilateral, moderate stage: Secondary | ICD-10-CM | POA: Diagnosis not present

## 2019-04-24 DIAGNOSIS — M79671 Pain in right foot: Secondary | ICD-10-CM | POA: Diagnosis not present

## 2019-04-24 DIAGNOSIS — M79672 Pain in left foot: Secondary | ICD-10-CM | POA: Diagnosis not present

## 2019-04-24 DIAGNOSIS — B351 Tinea unguium: Secondary | ICD-10-CM | POA: Diagnosis not present

## 2019-04-24 DIAGNOSIS — B353 Tinea pedis: Secondary | ICD-10-CM | POA: Diagnosis not present

## 2019-04-26 DIAGNOSIS — H401132 Primary open-angle glaucoma, bilateral, moderate stage: Secondary | ICD-10-CM | POA: Diagnosis not present

## 2019-05-16 ENCOUNTER — Ambulatory Visit: Payer: 59 | Admitting: Family Medicine

## 2019-05-25 DIAGNOSIS — N3281 Overactive bladder: Secondary | ICD-10-CM | POA: Diagnosis not present

## 2019-05-25 DIAGNOSIS — Z1211 Encounter for screening for malignant neoplasm of colon: Secondary | ICD-10-CM | POA: Diagnosis not present

## 2019-05-25 DIAGNOSIS — Z1272 Encounter for screening for malignant neoplasm of vagina: Secondary | ICD-10-CM | POA: Diagnosis not present

## 2019-05-25 DIAGNOSIS — Z01419 Encounter for gynecological examination (general) (routine) without abnormal findings: Secondary | ICD-10-CM | POA: Diagnosis not present

## 2019-05-30 IMAGING — MG DIGITAL SCREENING BILATERAL MAMMOGRAM WITH TOMO AND CAD
6 of 10 series · 6 of 30 positions shown · non-contrast
Comparison: Previous exam(s).

CLINICAL DATA: Screening.

EXAM:
DIGITAL SCREENING BILATERAL MAMMOGRAM WITH TOMO AND CAD

[L MLO synth-2D (1 of 2)]
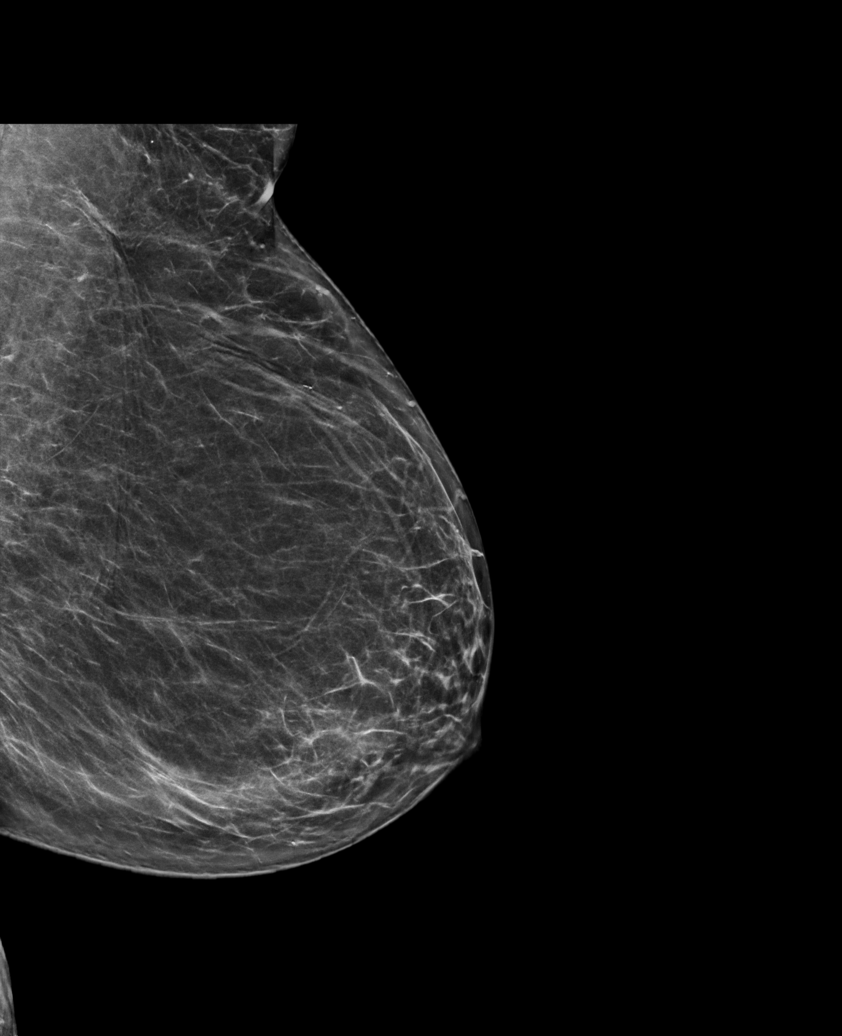

[L CC synth-2D]
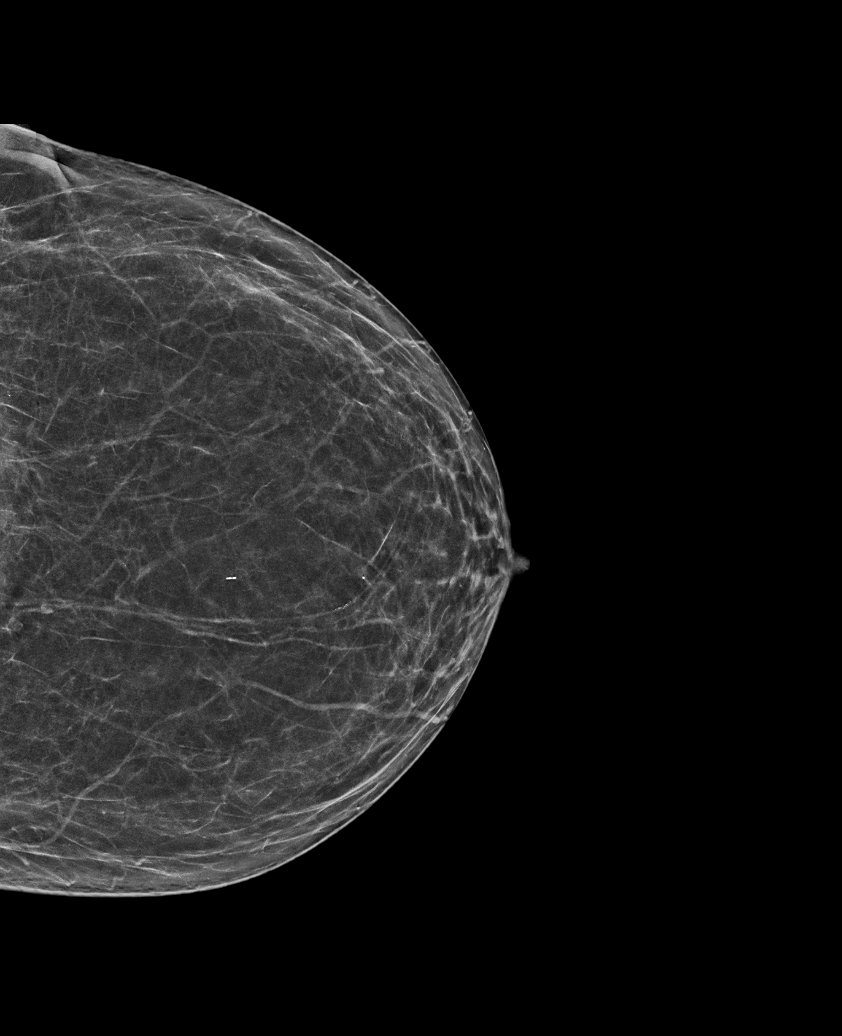

[R MLO synth-2D]
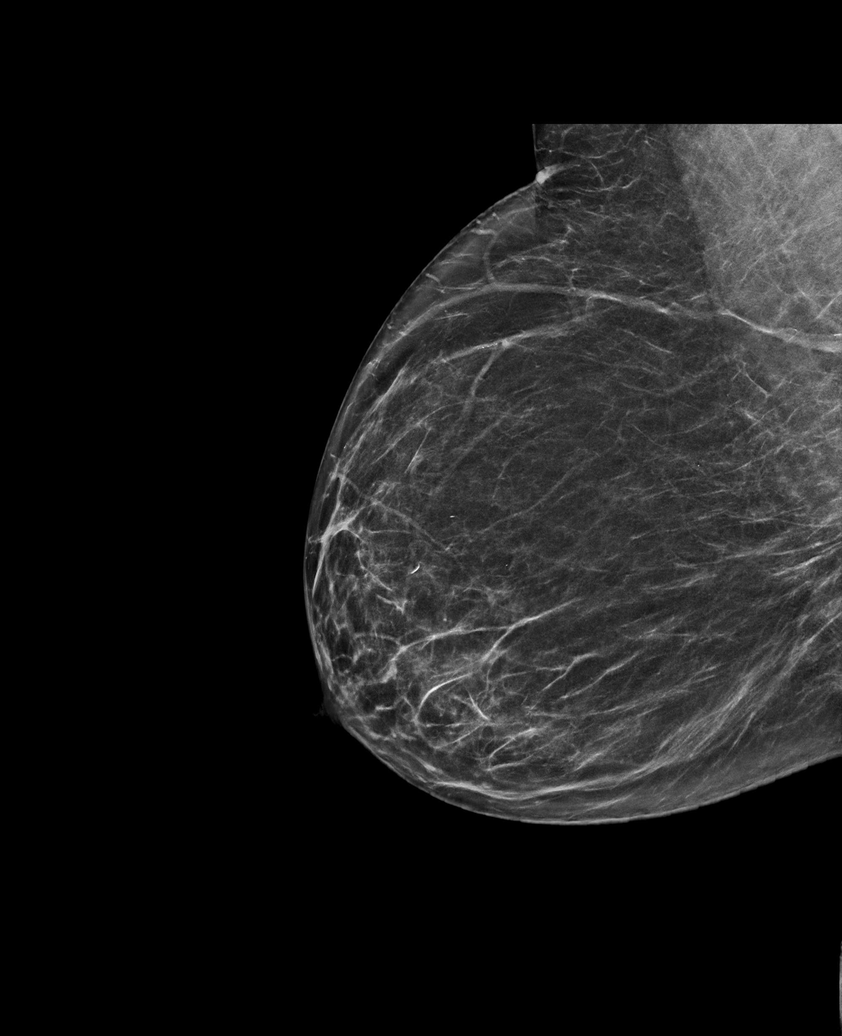

[L MLO synth-2D (2 of 2)]
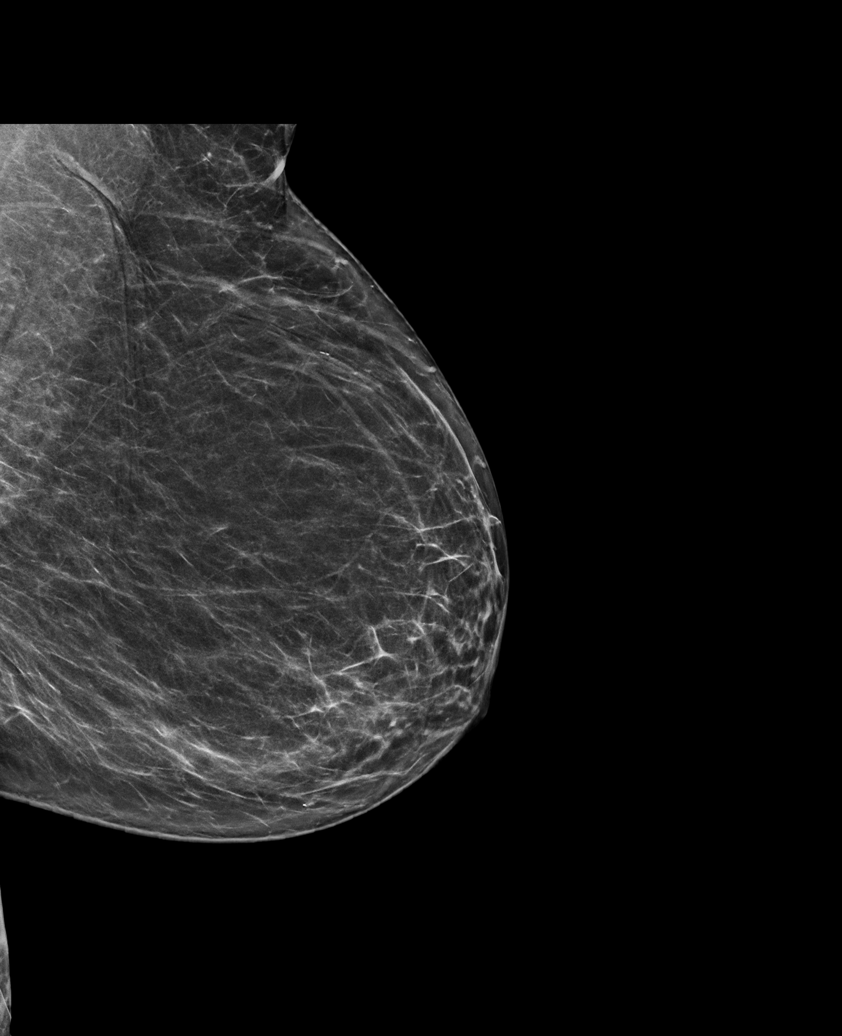

[R CC synth-2D]
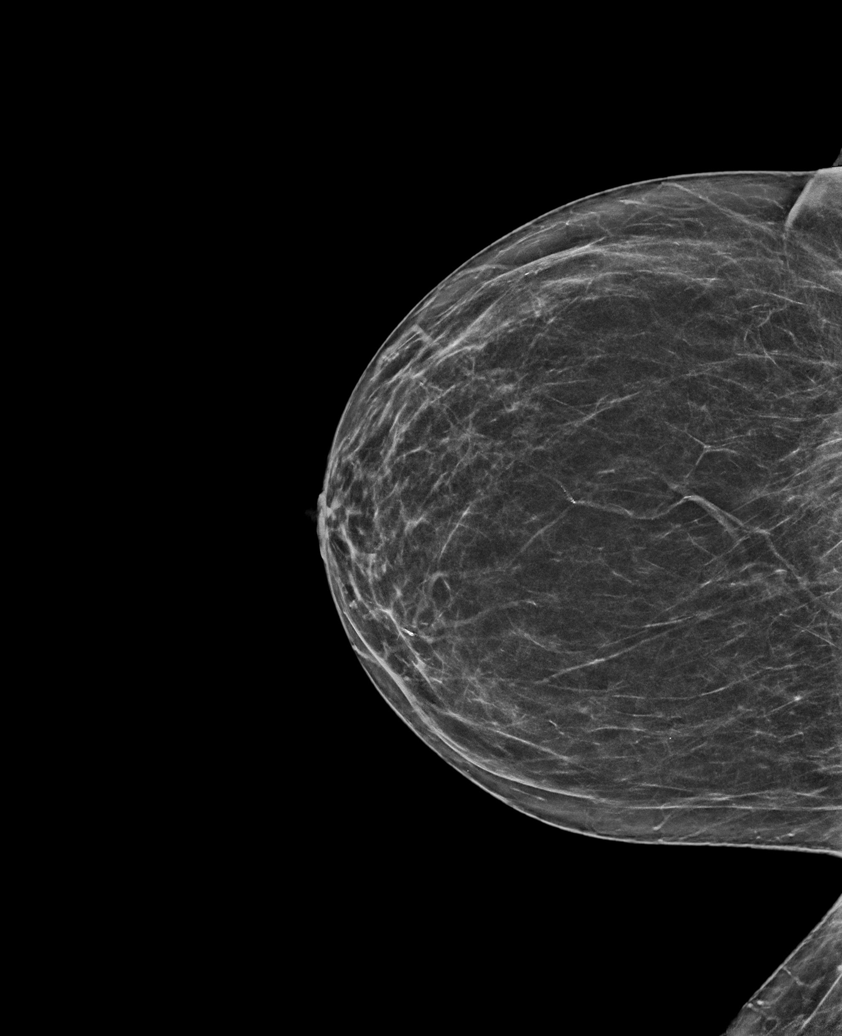

[L CC tomo · tomo slice 32/63.0]
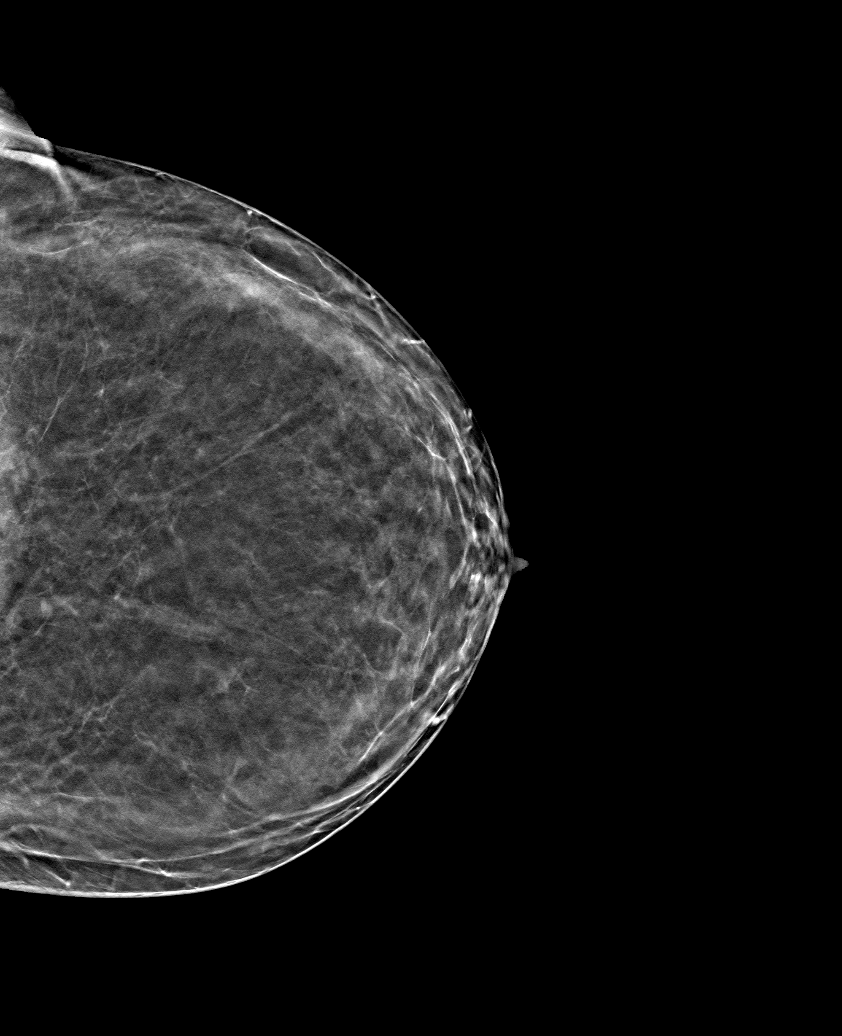

[6 of 30 positions shown; findings below may reference images not displayed]

ACR Breast Density Category b: There are scattered areas of
fibroglandular density.
FINDINGS: There are no findings suspicious for malignancy. Images were
processed with CAD.
IMPRESSION: No mammographic evidence of malignancy. A result letter of this
screening mammogram will be mailed directly to the patient.

RECOMMENDATION:
Screening mammogram in one year. (Code:[id number])

BI-RADS CATEGORY  1: Negative.

## 2019-06-06 DIAGNOSIS — Z1211 Encounter for screening for malignant neoplasm of colon: Secondary | ICD-10-CM | POA: Diagnosis not present

## 2019-06-07 DIAGNOSIS — E782 Mixed hyperlipidemia: Secondary | ICD-10-CM | POA: Diagnosis not present

## 2019-06-07 DIAGNOSIS — J449 Chronic obstructive pulmonary disease, unspecified: Secondary | ICD-10-CM | POA: Diagnosis not present

## 2019-06-07 DIAGNOSIS — I2510 Atherosclerotic heart disease of native coronary artery without angina pectoris: Secondary | ICD-10-CM | POA: Diagnosis not present

## 2019-06-07 DIAGNOSIS — F411 Generalized anxiety disorder: Secondary | ICD-10-CM | POA: Diagnosis not present

## 2019-06-07 DIAGNOSIS — Z0000 Encounter for general adult medical examination without abnormal findings: Secondary | ICD-10-CM | POA: Diagnosis not present

## 2019-06-07 DIAGNOSIS — E118 Type 2 diabetes mellitus with unspecified complications: Secondary | ICD-10-CM | POA: Diagnosis not present

## 2019-06-07 DIAGNOSIS — I10 Essential (primary) hypertension: Secondary | ICD-10-CM | POA: Diagnosis not present

## 2019-06-07 DIAGNOSIS — N183 Chronic kidney disease, stage 3 (moderate): Secondary | ICD-10-CM | POA: Diagnosis not present

## 2019-06-07 DIAGNOSIS — K210 Gastro-esophageal reflux disease with esophagitis: Secondary | ICD-10-CM | POA: Diagnosis not present

## 2019-06-08 DIAGNOSIS — D539 Nutritional anemia, unspecified: Secondary | ICD-10-CM | POA: Diagnosis not present

## 2019-06-08 DIAGNOSIS — I10 Essential (primary) hypertension: Secondary | ICD-10-CM | POA: Diagnosis not present

## 2019-06-08 DIAGNOSIS — E118 Type 2 diabetes mellitus with unspecified complications: Secondary | ICD-10-CM | POA: Diagnosis not present

## 2019-06-08 DIAGNOSIS — R6889 Other general symptoms and signs: Secondary | ICD-10-CM | POA: Diagnosis not present

## 2019-06-08 DIAGNOSIS — D649 Anemia, unspecified: Secondary | ICD-10-CM | POA: Diagnosis not present

## 2019-06-08 DIAGNOSIS — E782 Mixed hyperlipidemia: Secondary | ICD-10-CM | POA: Diagnosis not present

## 2019-06-08 DIAGNOSIS — N183 Chronic kidney disease, stage 3 (moderate): Secondary | ICD-10-CM | POA: Diagnosis not present

## 2019-06-08 DIAGNOSIS — J449 Chronic obstructive pulmonary disease, unspecified: Secondary | ICD-10-CM | POA: Diagnosis not present

## 2019-06-08 DIAGNOSIS — K210 Gastro-esophageal reflux disease with esophagitis: Secondary | ICD-10-CM | POA: Diagnosis not present

## 2019-06-08 DIAGNOSIS — Z0000 Encounter for general adult medical examination without abnormal findings: Secondary | ICD-10-CM | POA: Diagnosis not present

## 2019-06-29 DIAGNOSIS — D649 Anemia, unspecified: Secondary | ICD-10-CM | POA: Diagnosis not present

## 2019-07-10 ENCOUNTER — Other Ambulatory Visit: Payer: Self-pay | Admitting: Family Medicine

## 2019-07-10 DIAGNOSIS — I2510 Atherosclerotic heart disease of native coronary artery without angina pectoris: Secondary | ICD-10-CM | POA: Diagnosis not present

## 2019-07-10 DIAGNOSIS — I10 Essential (primary) hypertension: Secondary | ICD-10-CM | POA: Diagnosis not present

## 2019-07-10 DIAGNOSIS — IMO0001 Reserved for inherently not codable concepts without codable children: Secondary | ICD-10-CM

## 2019-07-10 DIAGNOSIS — E782 Mixed hyperlipidemia: Secondary | ICD-10-CM | POA: Diagnosis not present

## 2019-07-10 MED ORDER — SITAGLIPTIN PHOSPHATE 100 MG PO TABS: ORAL_TABLET | ORAL | 0 refills | Status: DC

## 2019-07-10 NOTE — Addendum Note (Signed)
Addended by: Hubbard Hartshorn on: 07/10/2019 03:27 PM   Modules accepted: Orders

## 2019-07-20 DIAGNOSIS — D649 Anemia, unspecified: Secondary | ICD-10-CM | POA: Diagnosis not present

## 2019-07-20 DIAGNOSIS — E118 Type 2 diabetes mellitus with unspecified complications: Secondary | ICD-10-CM | POA: Diagnosis not present

## 2019-07-20 DIAGNOSIS — F411 Generalized anxiety disorder: Secondary | ICD-10-CM | POA: Diagnosis not present

## 2019-07-20 DIAGNOSIS — I10 Essential (primary) hypertension: Secondary | ICD-10-CM | POA: Diagnosis not present

## 2019-07-20 DIAGNOSIS — K210 Gastro-esophageal reflux disease with esophagitis: Secondary | ICD-10-CM | POA: Diagnosis not present

## 2019-08-10 DIAGNOSIS — I499 Cardiac arrhythmia, unspecified: Secondary | ICD-10-CM | POA: Diagnosis not present

## 2019-08-10 DIAGNOSIS — R197 Diarrhea, unspecified: Secondary | ICD-10-CM | POA: Diagnosis not present

## 2019-08-10 DIAGNOSIS — R1013 Epigastric pain: Secondary | ICD-10-CM | POA: Diagnosis not present

## 2019-09-05 DIAGNOSIS — D649 Anemia, unspecified: Secondary | ICD-10-CM | POA: Diagnosis not present

## 2019-09-05 DIAGNOSIS — I2510 Atherosclerotic heart disease of native coronary artery without angina pectoris: Secondary | ICD-10-CM | POA: Diagnosis not present

## 2019-09-11 DIAGNOSIS — I2510 Atherosclerotic heart disease of native coronary artery without angina pectoris: Secondary | ICD-10-CM | POA: Diagnosis not present

## 2019-09-11 DIAGNOSIS — I10 Essential (primary) hypertension: Secondary | ICD-10-CM | POA: Diagnosis not present

## 2019-09-11 DIAGNOSIS — E782 Mixed hyperlipidemia: Secondary | ICD-10-CM | POA: Diagnosis not present

## 2019-09-25 DIAGNOSIS — M79672 Pain in left foot: Secondary | ICD-10-CM | POA: Diagnosis not present

## 2019-09-25 DIAGNOSIS — M79671 Pain in right foot: Secondary | ICD-10-CM | POA: Diagnosis not present

## 2019-09-25 DIAGNOSIS — B351 Tinea unguium: Secondary | ICD-10-CM | POA: Diagnosis not present

## 2019-10-03 DIAGNOSIS — E118 Type 2 diabetes mellitus with unspecified complications: Secondary | ICD-10-CM | POA: Diagnosis not present

## 2019-10-03 DIAGNOSIS — E782 Mixed hyperlipidemia: Secondary | ICD-10-CM | POA: Diagnosis not present

## 2019-10-03 DIAGNOSIS — D509 Iron deficiency anemia, unspecified: Secondary | ICD-10-CM | POA: Diagnosis not present

## 2019-10-03 DIAGNOSIS — J449 Chronic obstructive pulmonary disease, unspecified: Secondary | ICD-10-CM | POA: Diagnosis not present

## 2019-10-03 DIAGNOSIS — R413 Other amnesia: Secondary | ICD-10-CM | POA: Diagnosis not present

## 2019-10-03 DIAGNOSIS — N1831 Chronic kidney disease, stage 3a: Secondary | ICD-10-CM | POA: Diagnosis not present

## 2019-10-03 DIAGNOSIS — I10 Essential (primary) hypertension: Secondary | ICD-10-CM | POA: Diagnosis not present

## 2019-10-09 ENCOUNTER — Other Ambulatory Visit: Payer: Self-pay | Admitting: *Deleted

## 2019-10-09 DIAGNOSIS — Z20822 Contact with and (suspected) exposure to covid-19: Secondary | ICD-10-CM

## 2019-10-11 LAB — NOVEL CORONAVIRUS, NAA: SARS-CoV-2, NAA: NOT DETECTED

## 2019-10-25 DIAGNOSIS — H401132 Primary open-angle glaucoma, bilateral, moderate stage: Secondary | ICD-10-CM | POA: Diagnosis not present

## 2019-11-11 ENCOUNTER — Encounter: Payer: Self-pay | Admitting: Emergency Medicine

## 2019-11-11 ENCOUNTER — Ambulatory Visit
Admission: EM | Admit: 2019-11-11 | Discharge: 2019-11-11 | Disposition: A | Discharge status: Home / Self Care | Payer: 59 | Attending: Internal Medicine | Admitting: Internal Medicine

## 2019-11-11 ENCOUNTER — Other Ambulatory Visit: Payer: Self-pay

## 2019-11-11 DIAGNOSIS — I700 Atherosclerosis of aorta: Secondary | ICD-10-CM | POA: Diagnosis not present

## 2019-11-11 DIAGNOSIS — I10 Essential (primary) hypertension: Secondary | ICD-10-CM | POA: Diagnosis not present

## 2019-11-11 DIAGNOSIS — Z87891 Personal history of nicotine dependence: Secondary | ICD-10-CM | POA: Diagnosis not present

## 2019-11-11 DIAGNOSIS — Z79899 Other long term (current) drug therapy: Secondary | ICD-10-CM | POA: Insufficient documentation

## 2019-11-11 DIAGNOSIS — I252 Old myocardial infarction: Secondary | ICD-10-CM | POA: Diagnosis not present

## 2019-11-11 DIAGNOSIS — Z9841 Cataract extraction status, right eye: Secondary | ICD-10-CM | POA: Insufficient documentation

## 2019-11-11 DIAGNOSIS — Z955 Presence of coronary angioplasty implant and graft: Secondary | ICD-10-CM | POA: Insufficient documentation

## 2019-11-11 DIAGNOSIS — Z8249 Family history of ischemic heart disease and other diseases of the circulatory system: Secondary | ICD-10-CM | POA: Insufficient documentation

## 2019-11-11 DIAGNOSIS — Z888 Allergy status to other drugs, medicaments and biological substances status: Secondary | ICD-10-CM | POA: Insufficient documentation

## 2019-11-11 DIAGNOSIS — E119 Type 2 diabetes mellitus without complications: Secondary | ICD-10-CM | POA: Diagnosis not present

## 2019-11-11 DIAGNOSIS — E785 Hyperlipidemia, unspecified: Secondary | ICD-10-CM | POA: Diagnosis not present

## 2019-11-11 DIAGNOSIS — Z20828 Contact with and (suspected) exposure to other viral communicable diseases: Secondary | ICD-10-CM | POA: Diagnosis not present

## 2019-11-11 DIAGNOSIS — K219 Gastro-esophageal reflux disease without esophagitis: Secondary | ICD-10-CM | POA: Insufficient documentation

## 2019-11-11 DIAGNOSIS — Z885 Allergy status to narcotic agent status: Secondary | ICD-10-CM | POA: Insufficient documentation

## 2019-11-11 DIAGNOSIS — Z90710 Acquired absence of both cervix and uterus: Secondary | ICD-10-CM | POA: Diagnosis not present

## 2019-11-11 DIAGNOSIS — Z833 Family history of diabetes mellitus: Secondary | ICD-10-CM | POA: Diagnosis not present

## 2019-11-11 DIAGNOSIS — J449 Chronic obstructive pulmonary disease, unspecified: Secondary | ICD-10-CM | POA: Insufficient documentation

## 2019-11-11 DIAGNOSIS — M5136 Other intervertebral disc degeneration, lumbar region: Secondary | ICD-10-CM | POA: Insufficient documentation

## 2019-11-11 DIAGNOSIS — Z7984 Long term (current) use of oral hypoglycemic drugs: Secondary | ICD-10-CM | POA: Insufficient documentation

## 2019-11-11 DIAGNOSIS — R519 Headache, unspecified: Secondary | ICD-10-CM | POA: Insufficient documentation

## 2019-11-11 DIAGNOSIS — Z7982 Long term (current) use of aspirin: Secondary | ICD-10-CM | POA: Insufficient documentation

## 2019-11-11 DIAGNOSIS — I2510 Atherosclerotic heart disease of native coronary artery without angina pectoris: Secondary | ICD-10-CM | POA: Insufficient documentation

## 2019-11-11 NOTE — ED Triage Notes (Addendum)
Patient c/o headache off an on that started 2-3 weeks ago.  Patient denies any pain at this time. Patient denies fevers.

## 2019-11-11 NOTE — ED Provider Notes (Signed)
[facility]    CSN: [id number] Arrival date & time: 11/11/19  1406      History   Chief Complaint Chief Complaint  Patient presents with  . Headache    HPI Breanna Petty is a 78 y.o. female history of hypertension, hyperlipidemia comes to urgent care with complaints of headache of 2 to 3 weeks duration.  Headache is intermittent.  No known aggravating or relieving factors.  Is of mild to moderate severity.  She usually experiences it before she goes to bed and wakes up without a headache.  She had some upper respiratory infection symptoms few days ago and would like to get checked out for Covid testing.  No fever, chills, cough, sputum production, loss of taste or smell.   HPI  Past Medical History:  Diagnosis Date  . Acute diverticulitis 10/02/2016  . Anginal pain ([place])   . Anxiety   . Aortic atherosclerosis ([place])   . Arthritis   . Atherosclerotic heart disease   . Depression   . Diabetes mellitus without complication ([place])   . Diverticulosis of colon   . Dysrhythmia   . GERD (gastroesophageal reflux disease)   . Hiatal hernia   . History of heart artery stent   . History of shingles   . HOH (hard of hearing)   . Hyperlipidemia   . Hypertension   . Myocardial infarction ([place])   . Sarcoidosis   . Vitamin D deficiency     Patient Active Problem List   Diagnosis Date Noted  . Status post exploratory laparotomy 10/21/2016  . Myocardial infarction ([place]) 10/20/2016  . SBO (small bowel obstruction) ([place])   . Aortic atherosclerosis ([place]) 10/02/2016  . Diverticulosis of large intestine without hemorrhage 10/02/2016  . Hammer toe of right foot 10/16/2015  . Nail deformity 10/16/2015  . Chronic obstructive pulmonary disease ([place]) 09/04/2015  . History of MI (myocardial infarction) 09/04/2015  . Spondylolisthesis at L4-L5 level 07/11/2015  . DDD (degenerative disc disease), lumbar 07/11/2015  . History of sarcoidosis 06/04/2015  . Urge incontinence of  urine 06/04/2015  . Hearing loss of both ears 05/07/2015  . Anxiety and depression 05/04/2015  . Arteriosclerosis of coronary artery 05/04/2015  . Type 2 diabetes mellitus with microalbuminuria, without long-term current use of insulin ([place]) 05/04/2015  . Dyslipidemia 05/04/2015  . Hypertension, benign 05/04/2015  . Gastro-esophageal reflux disease without esophagitis 05/04/2015  . Hiatal hernia 05/04/2015  . HZV (herpes zoster virus) post herpetic neuralgia 05/04/2015  . Vitamin D deficiency 05/04/2015  . S/P coronary artery stent placement 05/04/2015    Past Surgical History:  Procedure Laterality Date  . ABDOMINAL HYSTERECTOMY  1979  . APPENDECTOMY    . CARDIAC SURGERY    . CATARACT EXTRACTION W/PHACO Right 10/28/2017   Procedure: CATARACT EXTRACTION PHACO AND INTRAOCULAR LENS PLACEMENT (IOC);  Surgeon: Eulogio Bear, MD;  Location: [facility] ORS;  Service: Ophthalmology;  Laterality: Right;  Lot # [id number] H [place]: 00:53.9 AP%: 10.6 CDE: 5.71  . CORONARY ANGIOPLASTY     STENT  . LAPAROTOMY N/A 10/04/2016   Procedure: EXPLORATORY LAPAROTOMY;  Surgeon: Dia Crawford III, MD;  Location: [facility] ORS;  Service: General;  Laterality: N/A;    OB History   No obstetric history on file.      Home Medications    Prior to Admission medications   Medication Sig Start Date End Date Taking? Authorizing Provider  amLODipine (NORVASC) 5 MG tablet Take 1 tablet (5 mg total) by mouth daily. 12/23/18  Yes Juline Patch, MD  aspirin EC 81 MG tablet Take 1 tablet (81 mg total) by mouth daily. 12/29/16  Yes Juline Patch, MD  famotidine (PEPCID) 20 MG tablet Take 1 tablet (20 mg total) by mouth every morning. 03/16/19  Yes Raelyn Ensign E, FNP  glucose blood test strip Use as instructed 04/21/19  Yes Hubbard Hartshorn, FNP  latanoprost (XALATAN) 0.005 % ophthalmic solution Place 1 drop into both eyes at bedtime.    Yes [provider]  losartan (COZAAR) 50 MG tablet Take 1 tablet (50 mg total) by  mouth daily. 12/23/18  Yes Juline Patch, MD  Multiple Vitamin (MULTIVITAMIN) capsule Take 1 capsule by mouth daily.   Yes [provider]  NITROSTAT 0.4 MG SL tablet DISSOLVE ONE TABLET UNDER TONGUE AS NEED FOR CHEST PAIN AS DIRECTED 11/28/15  Yes Sowles, Drue Stager, MD  omeprazole (PRILOSEC) 20 MG capsule One twice a day 07/05/18  Yes Juline Patch, MD  pravastatin (PRAVACHOL) 40 MG tablet Take 1 tablet (40 mg total) by mouth daily. 04/21/19  Yes Hubbard Hartshorn, FNP  sitaGLIPtin (JANUVIA) 100 MG tablet TAKE 1 TABLET (100 MG TOTAL) BY MOUTH DAILY. 07/10/19  Yes Hubbard Hartshorn, FNP    Family History Family History  Problem Relation Age of Onset  . Diabetes Daughter   . Hypertension Mother   . Diabetes Son   . Breast cancer Neg Hx     Social History Social History   Tobacco Use  . Smoking status: Former Smoker    Packs/day: 2.00    Years: 10.00    Pack years: 20.00    Types: Cigarettes    Quit date: 11/16/1988    Years since quitting: 31.0  . Smokeless tobacco: Never Used  Substance Use Topics  . Alcohol use: No    Alcohol/week: 0.0 standard drinks  . Drug use: No     Allergies   Ace inhibitors, Codeine, Codeine sulfate, Ezetimibe, and Other   Review of Systems Review of Systems  Constitutional: Negative.   HENT: Positive for congestion. Negative for sore throat.   Respiratory: Negative.   Cardiovascular: Negative.   Gastrointestinal: Negative.   Musculoskeletal: Negative for arthralgias and myalgias.  Skin: Negative.   Neurological: Positive for headaches. Negative for dizziness.     Physical Exam Triage Vital Signs ED Triage Vitals  Enc Vitals Group     BP 11/11/19 1456 (!) 148/58     Pulse Rate 11/11/19 1456 71     Resp 11/11/19 1456 14     Temp 11/11/19 1456 98.1 F (36.7 C)     Temp Source 11/11/19 1456 Oral     SpO2 11/11/19 1456 99 %     Weight 11/11/19 1452 164 lb (74.4 kg)     Height 11/11/19 1452 5\' 11"  (1.803 m)     Head Circumference --        Peak Flow --      Pain Score 11/11/19 1452 0     Pain Loc --      Pain Edu? --      Excl. in [place]? --    No data found.  Updated Vital Signs BP (!) 148/58 (BP Location: Right Arm)   Pulse 71   Temp 98.1 F (36.7 C) (Oral)   Resp 14   Ht 5\' 11"  (1.803 m)   Wt 74.4 kg   SpO2 99%   BMI 22.87 kg/m   Visual Acuity Right Eye Distance:  Left Eye Distance:   Bilateral Distance:    Right Eye Near:   Left Eye Near:    Bilateral Near:     Physical Exam Vitals and nursing note reviewed.  Constitutional:      General: She is not in acute distress.    Appearance: She is well-developed. She is not ill-appearing or toxic-appearing.  Eyes:     General: No visual field deficit. Cardiovascular:     Rate and Rhythm: Normal rate and regular rhythm.     Heart sounds: Normal heart sounds. No murmur. No friction rub.  Pulmonary:     Effort: Pulmonary effort is normal.     Breath sounds: Normal breath sounds. No stridor. No wheezing or rhonchi.  Abdominal:     General: Bowel sounds are normal.     Palpations: Abdomen is soft.  Skin:    General: Skin is warm.     Capillary Refill: Capillary refill takes less than 2 seconds.  Neurological:     Mental Status: She is alert.     GCS: GCS eye subscore is 4. GCS verbal subscore is 5. GCS motor subscore is 6.     Cranial Nerves: No cranial nerve deficit, dysarthria or facial asymmetry.     Sensory: No sensory deficit.     Motor: No weakness.     Deep Tendon Reflexes: Reflexes normal.      UC Treatments / Results  Labs (all labs ordered are listed, but only abnormal results are displayed) Labs Reviewed  NOVEL CORONAVIRUS, NAA (HOSP ORDER, SEND-OUT TO REF LAB; TAT 18-24 HRS)    EKG   Radiology No results found.  Procedures Procedures (including critical care time)  Medications Ordered in UC Medications - No data to display  Initial Impression / Assessment and Plan / UC Course  I have reviewed the triage vital signs and  the nursing notes.  Pertinent labs & imaging results that were available during my care of the patient were reviewed by me and considered in my medical decision making (see chart for details).     1.  Headache: Tylenol over-the-counter as needed Covid testing done Patient is advised to self isolate until COVID-19 test results are available If patient develops any more symptoms or worsening symptoms she is welcome to return to the urgent care to be reevaluated. Final Clinical Impressions(s) / UC Diagnoses   Final diagnoses:  Nonintractable headache, unspecified chronicity pattern, unspecified headache type   Discharge Instructions   None    ED Prescriptions    None     PDMP not reviewed this encounter.   Chase Picket, MD 11/11/19 [phone]

## 2019-11-12 LAB — NOVEL CORONAVIRUS, NAA (HOSP ORDER, SEND-OUT TO REF LAB; TAT 18-24 HRS): SARS-CoV-2, NAA: NOT DETECTED

## 2019-11-13 ENCOUNTER — Other Ambulatory Visit: Payer: Self-pay | Admitting: Family Medicine

## 2019-11-15 DIAGNOSIS — J019 Acute sinusitis, unspecified: Secondary | ICD-10-CM | POA: Diagnosis not present

## 2019-11-15 DIAGNOSIS — I10 Essential (primary) hypertension: Secondary | ICD-10-CM | POA: Diagnosis not present

## 2019-11-15 DIAGNOSIS — E118 Type 2 diabetes mellitus with unspecified complications: Secondary | ICD-10-CM | POA: Diagnosis not present

## 2019-12-06 ENCOUNTER — Other Ambulatory Visit: Payer: Self-pay | Admitting: Family Medicine

## 2019-12-06 DIAGNOSIS — Z1231 Encounter for screening mammogram for malignant neoplasm of breast: Secondary | ICD-10-CM

## 2020-01-03 DIAGNOSIS — K2100 Gastro-esophageal reflux disease with esophagitis, without bleeding: Secondary | ICD-10-CM | POA: Diagnosis not present

## 2020-01-03 DIAGNOSIS — J449 Chronic obstructive pulmonary disease, unspecified: Secondary | ICD-10-CM | POA: Diagnosis not present

## 2020-01-03 DIAGNOSIS — E118 Type 2 diabetes mellitus with unspecified complications: Secondary | ICD-10-CM | POA: Diagnosis not present

## 2020-01-03 DIAGNOSIS — N1831 Chronic kidney disease, stage 3a: Secondary | ICD-10-CM | POA: Diagnosis not present

## 2020-01-03 DIAGNOSIS — E782 Mixed hyperlipidemia: Secondary | ICD-10-CM | POA: Diagnosis not present

## 2020-01-03 DIAGNOSIS — I10 Essential (primary) hypertension: Secondary | ICD-10-CM | POA: Diagnosis not present

## 2020-01-10 ENCOUNTER — Ambulatory Visit
Admission: RE | Admit: 2020-01-10 | Discharge: 2020-01-10 | Disposition: A | Discharge status: Home / Self Care | Payer: 59 | Source: Ambulatory Visit | Attending: Family Medicine | Admitting: Family Medicine

## 2020-01-10 DIAGNOSIS — Z1231 Encounter for screening mammogram for malignant neoplasm of breast: Secondary | ICD-10-CM | POA: Insufficient documentation

## 2020-01-23 ENCOUNTER — Emergency Department: Payer: 59

## 2020-01-23 ENCOUNTER — Other Ambulatory Visit: Payer: Self-pay

## 2020-01-23 ENCOUNTER — Emergency Department
Admission: EM | Admit: 2020-01-23 | Discharge: 2020-01-23 | Disposition: A | Discharge status: Home / Self Care | Payer: 59 | Attending: Emergency Medicine | Admitting: Emergency Medicine

## 2020-01-23 DIAGNOSIS — Z7982 Long term (current) use of aspirin: Secondary | ICD-10-CM | POA: Diagnosis not present

## 2020-01-23 DIAGNOSIS — Z87891 Personal history of nicotine dependence: Secondary | ICD-10-CM | POA: Insufficient documentation

## 2020-01-23 DIAGNOSIS — Z7984 Long term (current) use of oral hypoglycemic drugs: Secondary | ICD-10-CM | POA: Diagnosis not present

## 2020-01-23 DIAGNOSIS — E119 Type 2 diabetes mellitus without complications: Secondary | ICD-10-CM | POA: Diagnosis not present

## 2020-01-23 DIAGNOSIS — Z79899 Other long term (current) drug therapy: Secondary | ICD-10-CM | POA: Diagnosis not present

## 2020-01-23 DIAGNOSIS — I10 Essential (primary) hypertension: Secondary | ICD-10-CM | POA: Insufficient documentation

## 2020-01-23 DIAGNOSIS — Z955 Presence of coronary angioplasty implant and graft: Secondary | ICD-10-CM | POA: Diagnosis not present

## 2020-01-23 DIAGNOSIS — I2510 Atherosclerotic heart disease of native coronary artery without angina pectoris: Secondary | ICD-10-CM | POA: Diagnosis not present

## 2020-01-23 DIAGNOSIS — R0789 Other chest pain: Secondary | ICD-10-CM

## 2020-01-23 DIAGNOSIS — R079 Chest pain, unspecified: Secondary | ICD-10-CM | POA: Diagnosis not present

## 2020-01-23 LAB — CBC
HCT: 36.7 % (ref 36.0–46.0)
Hemoglobin: 12.1 g/dL (ref 12.0–15.0)
MCH: 30.7 pg (ref 26.0–34.0)
MCHC: 33 g/dL (ref 30.0–36.0)
MCV: 93.1 fL (ref 80.0–100.0)
Platelets: 200 10*3/uL (ref 150–400)
RBC: 3.94 MIL/uL (ref 3.87–5.11)
RDW: 14 % (ref 11.5–15.5)
WBC: 6 10*3/uL (ref 4.0–10.5)
nRBC: 0 % (ref 0.0–0.2)

## 2020-01-23 LAB — BASIC METABOLIC PANEL
Anion gap: 11 (ref 5–15)
BUN: 17 mg/dL (ref 8–23)
CO2: 26 mmol/L (ref 22–32)
Calcium: 9.7 mg/dL (ref 8.9–10.3)
Chloride: 102 mmol/L (ref 98–111)
Creatinine, Ser: 0.96 mg/dL (ref 0.44–1.00)
GFR calc Af Amer: >60 mL/min (ref >60)
GFR calc non Af Amer: 57 mL/min — ABNORMAL LOW (ref >60)
Glucose, Bld: 101 mg/dL — ABNORMAL HIGH (ref 70–99)
Potassium: 3.8 mmol/L (ref 3.5–5.1)
Sodium: 139 mmol/L (ref 135–145)

## 2020-01-23 LAB — TROPONIN I (HIGH SENSITIVITY)
Troponin I (High Sensitivity): 4 ng/L (ref <18)
Troponin I (High Sensitivity): 5 ng/L (ref <18)

## 2020-01-23 MED ORDER — SODIUM CHLORIDE 0.9% FLUSH: 3.0000 mL | Freq: Once | INTRAVENOUS | Status: DC

## 2020-01-23 NOTE — ED Provider Notes (Signed)
[facility] Emergency Department Provider Note ____________________________________________   First MD Initiated Contact with Patient 01/23/20 1501     (approximate)  I have reviewed the triage vital signs and the nursing notes.   HISTORY  Chief Complaint Chest Pain    HPI Breanna Petty is a 79 y.o. female with PMH as noted below including diabetes, hypertension, and a history of CAD with an MI and stenting in the past who presents with chest pain, acute onset yesterday evening, persisted until today, but now mostly resolved.  She reports that it is sharp and somewhat like tightness, and mainly in the left upper area of her chest.  It does not radiate anywhere.  She has no associated shortness of breath, weakness or lightheadedness, or nausea.  She denies any prior history of this pain.  The patient states that she is under increased stress after a conflict with a coworker, and feels that the pain is likely due to this.  Past Medical History:  Diagnosis Date  . Acute diverticulitis 10/02/2016  . Anginal pain ([place])   . Anxiety   . Aortic atherosclerosis ([place])   . Arthritis   . Atherosclerotic heart disease   . Depression   . Diabetes mellitus without complication ([place])   . Diverticulosis of colon   . Dysrhythmia   . GERD (gastroesophageal reflux disease)   . Hiatal hernia   . History of heart artery stent   . History of shingles   . HOH (hard of hearing)   . Hyperlipidemia   . Hypertension   . Myocardial infarction ([place])   . Sarcoidosis   . Vitamin D deficiency     Patient Active Problem List   Diagnosis Date Noted  . Status post exploratory laparotomy 10/21/2016  . Myocardial infarction ([place]) 10/20/2016  . SBO (small bowel obstruction) ([place])   . Aortic atherosclerosis ([place]) 10/02/2016  . Diverticulosis of large intestine without hemorrhage 10/02/2016  . Hammer toe of right foot 10/16/2015  . Nail deformity 10/16/2015  . Chronic  obstructive pulmonary disease ([place]) 09/04/2015  . History of MI (myocardial infarction) 09/04/2015  . Spondylolisthesis at L4-L5 level 07/11/2015  . DDD (degenerative disc disease), lumbar 07/11/2015  . History of sarcoidosis 06/04/2015  . Urge incontinence of urine 06/04/2015  . Hearing loss of both ears 05/07/2015  . Anxiety and depression 05/04/2015  . Arteriosclerosis of coronary artery 05/04/2015  . Type 2 diabetes mellitus with microalbuminuria, without long-term current use of insulin ([place]) 05/04/2015  . Dyslipidemia 05/04/2015  . Hypertension, benign 05/04/2015  . Gastro-esophageal reflux disease without esophagitis 05/04/2015  . Hiatal hernia 05/04/2015  . HZV (herpes zoster virus) post herpetic neuralgia 05/04/2015  . Vitamin D deficiency 05/04/2015  . S/P coronary artery stent placement 05/04/2015    Past Surgical History:  Procedure Laterality Date  . ABDOMINAL HYSTERECTOMY  1979  . APPENDECTOMY    . CARDIAC SURGERY    . CATARACT EXTRACTION W/PHACO Right 10/28/2017   Procedure: CATARACT EXTRACTION PHACO AND INTRAOCULAR LENS PLACEMENT (IOC);  Surgeon: Eulogio Bear, MD;  Location: [facility] ORS;  Service: Ophthalmology;  Laterality: Right;  Lot # [id number] H [place]: 00:53.9 AP%: 10.6 CDE: 5.71  . CORONARY ANGIOPLASTY     STENT  . LAPAROTOMY N/A 10/04/2016   Procedure: EXPLORATORY LAPAROTOMY;  Surgeon: Dia Crawford III, MD;  Location: [facility] ORS;  Service: General;  Laterality: N/A;    Prior to Admission medications   Medication Sig Start Date End Date Taking? Authorizing Provider  amLODipine (NORVASC) 5 MG tablet Take 1 tablet (5 mg total) by mouth daily. 12/23/18   Juline Patch, MD  aspirin EC 81 MG tablet Take 1 tablet (81 mg total) by mouth daily. 12/29/16   Juline Patch, MD  famotidine (PEPCID) 20 MG tablet Take 1 tablet (20 mg total) by mouth every morning. 03/16/19   Hubbard Hartshorn, FNP  glucose blood (FREESTYLE LITE) test strip USE AS INSTRUCTED 11/13/19   Hubbard Hartshorn, FNP  latanoprost (XALATAN) 0.005 % ophthalmic solution Place 1 drop into both eyes at bedtime.     [provider]  losartan (COZAAR) 50 MG tablet Take 1 tablet (50 mg total) by mouth daily. 12/23/18   Juline Patch, MD  Multiple Vitamin (MULTIVITAMIN) capsule Take 1 capsule by mouth daily.    [provider]  NITROSTAT 0.4 MG SL tablet DISSOLVE ONE TABLET UNDER TONGUE AS NEED FOR CHEST PAIN AS DIRECTED 11/28/15   Ancil Boozer, Drue Stager, MD  omeprazole (PRILOSEC) 20 MG capsule One twice a day 07/05/18   Juline Patch, MD  pravastatin (PRAVACHOL) 40 MG tablet Take 1 tablet (40 mg total) by mouth daily. 04/21/19   Hubbard Hartshorn, FNP  sitaGLIPtin (JANUVIA) 100 MG tablet TAKE 1 TABLET (100 MG TOTAL) BY MOUTH DAILY. 07/10/19   Hubbard Hartshorn, FNP    Allergies Ace inhibitors, Codeine, Codeine sulfate, Ezetimibe, and Other  Family History  Problem Relation Age of Onset  . Diabetes Daughter   . Hypertension Mother   . Diabetes Son   . Breast cancer Neg Hx     Social History Social History   Tobacco Use  . Smoking status: Former Smoker    Packs/day: 2.00    Years: 10.00    Pack years: 20.00    Types: Cigarettes    Quit date: 11/16/1988    Years since quitting: 31.2  . Smokeless tobacco: Never Used  Substance Use Topics  . Alcohol use: No    Alcohol/week: 0.0 standard drinks  . Drug use: No    Review of Systems  Constitutional: No fever/chills Eyes: No visual changes. ENT: No neck pain. Cardiovascular: Positive for resolving chest pain. Respiratory: Denies shortness of breath. Gastrointestinal: No nausea or vomiting. Genitourinary: Negative for flank pain. Musculoskeletal: Negative for back pain. Skin: Negative for rash. Neurological: Negative for headache.   ____________________________________________   PHYSICAL EXAM:  VITAL SIGNS: ED Triage Vitals  Enc Vitals Group     BP 01/23/20 1139 (!) 176/82     Pulse Rate 01/23/20 1139 73     Resp 01/23/20 1139  19     Temp 01/23/20 1139 97.8 F (36.6 C)     Temp src --      SpO2 01/23/20 1139 100 %     Weight 01/23/20 1136 135 lb (61.2 kg)     Height 01/23/20 1136 5\' 4"  (1.626 m)     Head Circumference --      Peak Flow --      Pain Score 01/23/20 1136 5     Pain Loc --      Pain Edu? --      Excl. in [place]? --     Constitutional: Alert and oriented. Well appearing for age and in no acute distress. Eyes: Conjunctivae are normal.  Head: Atraumatic. Nose: No congestion/rhinnorhea. Mouth/Throat: Mucous membranes are moist.   Neck: Normal range of motion.  Cardiovascular: Normal rate, regular rhythm. Grossly normal heart sounds.  Good peripheral circulation.  No reproducible left chest wall tenderness. Respiratory: Normal respiratory effort.  No retractions. Lungs CTAB. Gastrointestinal: No distention.  Musculoskeletal: No lower extremity edema.  No calf or popliteal swelling or tenderness.  Extremities warm and well perfused.  Neurologic:  Normal speech and language. No gross focal neurologic deficits are appreciated.  Skin:  Skin is warm and dry. No rash noted. Psychiatric: Mood and affect are normal. Speech and behavior are normal.  ____________________________________________   LABS (all labs ordered are listed, but only abnormal results are displayed)  Labs Reviewed  BASIC METABOLIC PANEL - Abnormal; Notable for the following components:      Result Value   Glucose, Bld 101 (*)    GFR calc non Af Amer 57 (*)    All other components within normal limits  CBC  TROPONIN I (HIGH SENSITIVITY)  TROPONIN I (HIGH SENSITIVITY)   ____________________________________________  EKG  ED ECG REPORT I, Arta Silence, the attending physician, personally viewed and interpreted this ECG.  Date: 01/23/2020 EKG Time: 1144 Rate: 69 Rhythm: normal sinus rhythm QRS Axis: normal Intervals: normal ST/T Wave abnormalities: normal (interpretation slightly limited by poor baseline) Narrative  Interpretation: no evidence of acute ischemia; no significant change when compared to EKG of 11/28/2018  ____________________________________________  RADIOLOGY  CXR: No focal infiltrate or other acute abnormality  ____________________________________________   PROCEDURES  Procedure(s) performed: No  Procedures  Critical Care performed: No ____________________________________________   INITIAL IMPRESSION / ASSESSMENT AND PLAN / ED COURSE  Pertinent labs & imaging results that were available during my care of the patient were reviewed by me and considered in my medical decision making (see chart for details).  79 year old female with PMH as noted above including a prior history of CAD status post stenting presents with atypical left-sided chest pain since yesterday which she feels is caused by stress, and which has now mostly resolved.  It is nonexertional and not associated with shortness of breath or other concerning symptoms.   I reviewed the past medical records in [place].  The patient follows with Dr. Nehemiah Massed from cardiology.  On exam, the patient is overall well-appearing.  Her vital signs are normal.  The physical exam is unremarkable.  Her EKG is nonischemic.  The patient had an exercise stress echo last fall which was negative for chest pain or myocardial ischemia.  Overall I suspect musculoskeletal chest wall pain, stress related symptoms, or other benign etiology.  Given the patient's age and history we will obtain basic labs, cardiac enzymes x2, and a chest x-ray.  If this is negative, given that the pain has almost completely resolved, the recent negative stress test, and the patient's overall well appearance, I feel that discharge will be reasonable.  There is no clinical evidence for PE especially given the mostly resolved pain, and no evidence of aortic dissection or other vascular etiology.  ----------------------------------------- 3:50 PM on  01/23/2020 -----------------------------------------  The patient states that she feels well and wants to go home.  She does not want to wait for the second troponin.  I explained to her the reasoning behind the second troponin and the fact that without doing it we cannot fully rule out ACS, which could result in a heart attack, permanent heart damage, or death.  However, my overall suspicion is low especially given that the pain has been present since yesterday.  Therefore, I feel that discharge is reasonable.  I instructed the patient to follow-up with Dr. Nehemiah Massed.  I gave her thorough return precautions and  she expressed understanding.  ____________________________________________   FINAL CLINICAL IMPRESSION(S) / ED DIAGNOSES  Final diagnoses:  Atypical chest pain      NEW MEDICATIONS STARTED DURING THIS VISIT:  Discharge Medication List as of 01/23/2020  3:07 PM       Note:  This document was prepared using Dragon voice recognition software and may include unintentional dictation errors.    Arta Silence, MD 01/23/20 [phone]

## 2020-01-23 NOTE — ED Notes (Signed)
Pt states she wants to leave before the repeat Trop comes back. EDP Siadecki/[place] notified.

## 2020-01-23 NOTE — ED Triage Notes (Addendum)
Pt comes with c/o left sided chest pain. Pt states this started last night. Pt denies any radiation to neck or arm. Pt states it is continuous.  Pt here at work and states it has just gotten worse. Pt ambulatory with steady gait.  Pt denies any SOB, N/V/D, back pain or headache. Pt states just the chest pain 7/10 that feels like throbbing.

## 2020-01-23 NOTE — ED Notes (Addendum)
Pt alert and sitting calmly in bed. Denies CP currently. States started having CP last night and states initially was diaphoretic but had been using an electric blanket. Denies SOB, nausea, HA. Reports was taken off a cardiac medicine last year and remains on her two BP medications. Pt denies any possible physical mechanism of injury but states she has recently been under inc stress.

## 2020-01-23 NOTE — Discharge Instructions (Addendum)
Returns to the ER immediately for new, worsening, or persistent severe chest pain, difficulty breathing, weakness or lightheadedness, or any other new or worsening symptoms that concern you.  Follow-up with your cardiologist Dr. Mabeline Caras.

## 2020-02-07 DIAGNOSIS — M79671 Pain in right foot: Secondary | ICD-10-CM | POA: Diagnosis not present

## 2020-02-07 DIAGNOSIS — B351 Tinea unguium: Secondary | ICD-10-CM | POA: Diagnosis not present

## 2020-02-07 DIAGNOSIS — M79672 Pain in left foot: Secondary | ICD-10-CM | POA: Diagnosis not present

## 2020-02-21 DIAGNOSIS — I2510 Atherosclerotic heart disease of native coronary artery without angina pectoris: Secondary | ICD-10-CM | POA: Diagnosis not present

## 2020-02-21 DIAGNOSIS — I10 Essential (primary) hypertension: Secondary | ICD-10-CM | POA: Diagnosis not present

## 2020-02-21 DIAGNOSIS — I700 Atherosclerosis of aorta: Secondary | ICD-10-CM | POA: Diagnosis not present

## 2020-02-21 DIAGNOSIS — E782 Mixed hyperlipidemia: Secondary | ICD-10-CM | POA: Diagnosis not present

## 2020-03-12 DIAGNOSIS — H02883 Meibomian gland dysfunction of right eye, unspecified eyelid: Secondary | ICD-10-CM | POA: Diagnosis not present

## 2020-03-14 ENCOUNTER — Other Ambulatory Visit: Payer: Self-pay

## 2020-03-14 ENCOUNTER — Encounter: Payer: Self-pay | Admitting: Emergency Medicine

## 2020-03-14 ENCOUNTER — Ambulatory Visit
Admission: EM | Admit: 2020-03-14 | Discharge: 2020-03-14 | Disposition: A | Discharge status: Home / Self Care | Payer: 59 | Attending: Family Medicine | Admitting: Family Medicine

## 2020-03-14 DIAGNOSIS — Z20822 Contact with and (suspected) exposure to covid-19: Secondary | ICD-10-CM | POA: Insufficient documentation

## 2020-03-14 NOTE — ED Provider Notes (Signed)
[facility]    CSN: [id number] Arrival date & time: 03/14/20  1729  History   Chief Complaint Chief Complaint  Patient presents with  . Covid Exposure   HPI   79 year old female presents with Covid exposure.  Patient reports that her son recently tested positive for Covid.  She was around him approximately 6 days ago.  Patient states that she is currently feeling well.  She denies any respiratory symptoms.  She states that she occasionally has headache but does not believe this is related to her exposure.  No other reported symptoms.  Desires testing today.  No other complaints.  Past Medical History:  Diagnosis Date  . Acute diverticulitis 10/02/2016  . Anginal pain ([place])   . Anxiety   . Aortic atherosclerosis ([place])   . Arthritis   . Atherosclerotic heart disease   . Depression   . Diabetes mellitus without complication ([place])   . Diverticulosis of colon   . Dysrhythmia   . GERD (gastroesophageal reflux disease)   . Hiatal hernia   . History of heart artery stent   . History of shingles   . HOH (hard of hearing)   . Hyperlipidemia   . Hypertension   . Myocardial infarction ([place])   . Sarcoidosis   . Vitamin D deficiency     Patient Active Problem List   Diagnosis Date Noted  . Status post exploratory laparotomy 10/21/2016  . Myocardial infarction ([place]) 10/20/2016  . SBO (small bowel obstruction) ([place])   . Aortic atherosclerosis ([place]) 10/02/2016  . Diverticulosis of large intestine without hemorrhage 10/02/2016  . Hammer toe of right foot 10/16/2015  . Nail deformity 10/16/2015  . Chronic obstructive pulmonary disease ([place]) 09/04/2015  . History of MI (myocardial infarction) 09/04/2015  . Spondylolisthesis at L4-L5 level 07/11/2015  . DDD (degenerative disc disease), lumbar 07/11/2015  . History of sarcoidosis 06/04/2015  . Urge incontinence of urine 06/04/2015  . Hearing loss of both ears 05/07/2015  . Anxiety and depression 05/04/2015  .  Arteriosclerosis of coronary artery 05/04/2015  . Type 2 diabetes mellitus with microalbuminuria, without long-term current use of insulin ([place]) 05/04/2015  . Dyslipidemia 05/04/2015  . Hypertension, benign 05/04/2015  . Gastro-esophageal reflux disease without esophagitis 05/04/2015  . Hiatal hernia 05/04/2015  . HZV (herpes zoster virus) post herpetic neuralgia 05/04/2015  . Vitamin D deficiency 05/04/2015  . S/P coronary artery stent placement 05/04/2015    Past Surgical History:  Procedure Laterality Date  . ABDOMINAL HYSTERECTOMY  1979  . APPENDECTOMY    . CARDIAC SURGERY    . CATARACT EXTRACTION W/PHACO Right 10/28/2017   Procedure: CATARACT EXTRACTION PHACO AND INTRAOCULAR LENS PLACEMENT (IOC);  Surgeon: Eulogio Bear, MD;  Location: [facility] ORS;  Service: Ophthalmology;  Laterality: Right;  Lot # [id number] H [place]: 00:53.9 AP%: 10.6 CDE: 5.71  . CORONARY ANGIOPLASTY     STENT  . LAPAROTOMY N/A 10/04/2016   Procedure: EXPLORATORY LAPAROTOMY;  Surgeon: Dia Crawford III, MD;  Location: [facility] ORS;  Service: General;  Laterality: N/A;    OB History   No obstetric history on file.      Home Medications    Prior to Admission medications   Medication Sig Start Date End Date Taking? Authorizing Provider  amLODipine (NORVASC) 5 MG tablet Take 1 tablet (5 mg total) by mouth daily. 12/23/18  Yes Juline Patch, MD  aspirin EC 81 MG tablet Take 1 tablet (81 mg total) by mouth daily. 12/29/16  Yes Juline Patch,  MD  famotidine (PEPCID) 20 MG tablet Take 1 tablet (20 mg total) by mouth every morning. 03/16/19  Yes Raelyn Ensign E, FNP  glucose blood (FREESTYLE LITE) test strip USE AS INSTRUCTED 11/13/19  Yes Hubbard Hartshorn, FNP  latanoprost (XALATAN) 0.005 % ophthalmic solution Place 1 drop into both eyes at bedtime.    Yes [provider]  losartan (COZAAR) 50 MG tablet Take 1 tablet (50 mg total) by mouth daily. 12/23/18  Yes Juline Patch, MD  Multiple Vitamin (MULTIVITAMIN)  capsule Take 1 capsule by mouth daily.   Yes [provider]  NITROSTAT 0.4 MG SL tablet DISSOLVE ONE TABLET UNDER TONGUE AS NEED FOR CHEST PAIN AS DIRECTED 11/28/15  Yes Sowles, Drue Stager, MD  omeprazole (PRILOSEC) 20 MG capsule One twice a day 07/05/18  Yes Juline Patch, MD  pravastatin (PRAVACHOL) 40 MG tablet Take 1 tablet (40 mg total) by mouth daily. 04/21/19  Yes Hubbard Hartshorn, FNP  sitaGLIPtin (JANUVIA) 100 MG tablet TAKE 1 TABLET (100 MG TOTAL) BY MOUTH DAILY. 07/10/19  Yes Hubbard Hartshorn, FNP    Family History Family History  Problem Relation Age of Onset  . Diabetes Daughter   . Hypertension Mother   . Diabetes Son   . Breast cancer Neg Hx     Social History Social History   Tobacco Use  . Smoking status: Former Smoker    Packs/day: 2.00    Years: 10.00    Pack years: 20.00    Types: Cigarettes    Quit date: 11/16/1988    Years since quitting: 31.3  . Smokeless tobacco: Never Used  Substance Use Topics  . Alcohol use: No    Alcohol/week: 0.0 standard drinks  . Drug use: No     Allergies   Ace inhibitors, Codeine, Codeine sulfate, Ezetimibe, and Other   Review of Systems Review of Systems  Constitutional: Negative for fever.  HENT: Negative.   Respiratory: Negative.    Physical Exam Triage Vital Signs ED Triage Vitals  Enc Vitals Group     BP 03/14/20 1807 (!) 148/72     Pulse Rate 03/14/20 1807 80     Resp 03/14/20 1807 18     Temp 03/14/20 1807 98.4 F (36.9 C)     Temp Source 03/14/20 1807 Oral     SpO2 03/14/20 1807 98 %     Weight 03/14/20 1804 134 lb 14.7 oz (61.2 kg)     Height 03/14/20 1804 5\' 4"  (1.626 m)     Head Circumference --      Peak Flow --      Pain Score 03/14/20 1803 0     Pain Loc --      Pain Edu? --      Excl. in [place]? --    Updated Vital Signs BP (!) 148/72 (BP Location: Right Arm)   Pulse 80   Temp 98.4 F (36.9 C) (Oral)   Resp 18   Ht 5\' 4"  (1.626 m)   Wt 61.2 kg   SpO2 98%   BMI 23.16 kg/m   Visual  Acuity Right Eye Distance:   Left Eye Distance:   Bilateral Distance:    Right Eye Near:   Left Eye Near:    Bilateral Near:     Physical Exam Vitals and nursing note reviewed.  Constitutional:      General: She is not in acute distress.    Appearance: Normal appearance. She is not ill-appearing.  HENT:  Head: Normocephalic and atraumatic.  Eyes:     General:        Right eye: No discharge.        Left eye: No discharge.     Conjunctiva/sclera: Conjunctivae normal.  Cardiovascular:     Rate and Rhythm: Normal rate and regular rhythm.     Heart sounds: No murmur.  Pulmonary:     Effort: Pulmonary effort is normal.     Breath sounds: Normal breath sounds. No wheezing, rhonchi or rales.  Neurological:     Mental Status: She is alert.  Psychiatric:        Mood and Affect: Mood normal.        Behavior: Behavior normal.    UC Treatments / Results  Labs (all labs ordered are listed, but only abnormal results are displayed) Labs Reviewed  SARS CORONAVIRUS 2 (TAT 6-24 HRS)    EKG   Radiology No results found.  Procedures Procedures (including critical care time)  Medications Ordered in UC Medications - No data to display  Initial Impression / Assessment and Plan / UC Course  I have reviewed the triage vital signs and the nursing notes.  Pertinent labs & imaging results that were available during my care of the patient were reviewed by me and considered in my medical decision making (see chart for details).    79 year old female presents with exposure to COVID-19.  Currently asymptomatic.  Awaiting test results.  Final Clinical Impressions(s) / UC Diagnoses   Final diagnoses:  Exposure to COVID-19 virus  Encounter for laboratory testing for COVID-19 virus     Discharge Instructions     Results available in 24 hours.  Stay home.  Take care  Dr. Lacinda Axon     ED Prescriptions    None     PDMP not reviewed this encounter.   Coral Spikes,  [place] 03/14/20 1932

## 2020-03-14 NOTE — Discharge Instructions (Signed)
Results available in 24 hours.  Stay home.  Take care  Dr. Layah Skousen   

## 2020-03-14 NOTE — ED Triage Notes (Signed)
Pt present to [place] for covid testing. She states that her son tested positive today for covid. She was around him about 6 days ago. She states that she has a headache and had a cold prior to her son getting sick.

## 2020-03-15 LAB — SARS CORONAVIRUS 2 (TAT 6-24 HRS): SARS Coronavirus 2: NEGATIVE

## 2020-03-19 DIAGNOSIS — J3489 Other specified disorders of nose and nasal sinuses: Secondary | ICD-10-CM | POA: Diagnosis not present

## 2020-03-19 DIAGNOSIS — Z20822 Contact with and (suspected) exposure to covid-19: Secondary | ICD-10-CM | POA: Diagnosis not present

## 2020-04-09 DIAGNOSIS — H401132 Primary open-angle glaucoma, bilateral, moderate stage: Secondary | ICD-10-CM | POA: Diagnosis not present

## 2020-04-25 DIAGNOSIS — H401132 Primary open-angle glaucoma, bilateral, moderate stage: Secondary | ICD-10-CM | POA: Diagnosis not present

## 2020-05-16 ENCOUNTER — Other Ambulatory Visit: Payer: Self-pay | Admitting: Family Medicine

## 2020-05-16 DIAGNOSIS — M25561 Pain in right knee: Secondary | ICD-10-CM | POA: Diagnosis not present

## 2020-05-16 DIAGNOSIS — E118 Type 2 diabetes mellitus with unspecified complications: Secondary | ICD-10-CM | POA: Diagnosis not present

## 2020-05-28 DIAGNOSIS — M79672 Pain in left foot: Secondary | ICD-10-CM | POA: Diagnosis not present

## 2020-05-28 DIAGNOSIS — M79671 Pain in right foot: Secondary | ICD-10-CM | POA: Diagnosis not present

## 2020-05-28 DIAGNOSIS — B351 Tinea unguium: Secondary | ICD-10-CM | POA: Diagnosis not present

## 2020-07-05 DIAGNOSIS — I2510 Atherosclerotic heart disease of native coronary artery without angina pectoris: Secondary | ICD-10-CM | POA: Diagnosis not present

## 2020-07-05 DIAGNOSIS — R0789 Other chest pain: Secondary | ICD-10-CM | POA: Diagnosis not present

## 2020-07-05 DIAGNOSIS — I214 Non-ST elevation (NSTEMI) myocardial infarction: Secondary | ICD-10-CM | POA: Diagnosis not present

## 2020-07-05 DIAGNOSIS — R0600 Dyspnea, unspecified: Secondary | ICD-10-CM | POA: Diagnosis not present

## 2020-07-06 ENCOUNTER — Ambulatory Visit
Admission: EM | Admit: 2020-07-06 | Discharge: 2020-07-06 | Disposition: A | Discharge status: Home / Self Care | Payer: 59 | Attending: Family Medicine | Admitting: Family Medicine

## 2020-07-06 ENCOUNTER — Encounter: Payer: Self-pay | Admitting: Emergency Medicine

## 2020-07-06 ENCOUNTER — Other Ambulatory Visit: Payer: Self-pay

## 2020-07-06 DIAGNOSIS — F329 Major depressive disorder, single episode, unspecified: Secondary | ICD-10-CM | POA: Diagnosis not present

## 2020-07-06 DIAGNOSIS — K449 Diaphragmatic hernia without obstruction or gangrene: Secondary | ICD-10-CM | POA: Diagnosis not present

## 2020-07-06 DIAGNOSIS — Z7982 Long term (current) use of aspirin: Secondary | ICD-10-CM | POA: Diagnosis not present

## 2020-07-06 DIAGNOSIS — I700 Atherosclerosis of aorta: Secondary | ICD-10-CM | POA: Diagnosis not present

## 2020-07-06 DIAGNOSIS — E119 Type 2 diabetes mellitus without complications: Secondary | ICD-10-CM | POA: Insufficient documentation

## 2020-07-06 DIAGNOSIS — I10 Essential (primary) hypertension: Secondary | ICD-10-CM | POA: Diagnosis not present

## 2020-07-06 DIAGNOSIS — Z79899 Other long term (current) drug therapy: Secondary | ICD-10-CM | POA: Diagnosis not present

## 2020-07-06 DIAGNOSIS — Z833 Family history of diabetes mellitus: Secondary | ICD-10-CM | POA: Diagnosis not present

## 2020-07-06 DIAGNOSIS — Z9049 Acquired absence of other specified parts of digestive tract: Secondary | ICD-10-CM | POA: Diagnosis not present

## 2020-07-06 DIAGNOSIS — R0981 Nasal congestion: Secondary | ICD-10-CM | POA: Diagnosis not present

## 2020-07-06 DIAGNOSIS — E785 Hyperlipidemia, unspecified: Secondary | ICD-10-CM | POA: Diagnosis not present

## 2020-07-06 DIAGNOSIS — I2510 Atherosclerotic heart disease of native coronary artery without angina pectoris: Secondary | ICD-10-CM | POA: Diagnosis not present

## 2020-07-06 DIAGNOSIS — E559 Vitamin D deficiency, unspecified: Secondary | ICD-10-CM | POA: Insufficient documentation

## 2020-07-06 DIAGNOSIS — J449 Chronic obstructive pulmonary disease, unspecified: Secondary | ICD-10-CM | POA: Diagnosis not present

## 2020-07-06 DIAGNOSIS — I252 Old myocardial infarction: Secondary | ICD-10-CM | POA: Diagnosis not present

## 2020-07-06 DIAGNOSIS — M5136 Other intervertebral disc degeneration, lumbar region: Secondary | ICD-10-CM | POA: Insufficient documentation

## 2020-07-06 DIAGNOSIS — Z7984 Long term (current) use of oral hypoglycemic drugs: Secondary | ICD-10-CM | POA: Insufficient documentation

## 2020-07-06 DIAGNOSIS — Z888 Allergy status to other drugs, medicaments and biological substances status: Secondary | ICD-10-CM | POA: Insufficient documentation

## 2020-07-06 DIAGNOSIS — D869 Sarcoidosis, unspecified: Secondary | ICD-10-CM | POA: Diagnosis not present

## 2020-07-06 DIAGNOSIS — K219 Gastro-esophageal reflux disease without esophagitis: Secondary | ICD-10-CM | POA: Diagnosis not present

## 2020-07-06 DIAGNOSIS — Z955 Presence of coronary angioplasty implant and graft: Secondary | ICD-10-CM | POA: Diagnosis not present

## 2020-07-06 DIAGNOSIS — Z87891 Personal history of nicotine dependence: Secondary | ICD-10-CM | POA: Insufficient documentation

## 2020-07-06 DIAGNOSIS — Z20822 Contact with and (suspected) exposure to covid-19: Secondary | ICD-10-CM | POA: Insufficient documentation

## 2020-07-06 DIAGNOSIS — Z885 Allergy status to narcotic agent status: Secondary | ICD-10-CM | POA: Insufficient documentation

## 2020-07-06 DIAGNOSIS — F419 Anxiety disorder, unspecified: Secondary | ICD-10-CM | POA: Diagnosis not present

## 2020-07-06 DIAGNOSIS — R519 Headache, unspecified: Secondary | ICD-10-CM | POA: Diagnosis not present

## 2020-07-06 DIAGNOSIS — Z8249 Family history of ischemic heart disease and other diseases of the circulatory system: Secondary | ICD-10-CM | POA: Insufficient documentation

## 2020-07-06 MED ORDER — IPRATROPIUM BROMIDE 0.06 % NA SOLN: 2.0000 | Freq: Four times a day (QID) | NASAL | 0 refills | Status: DC | PRN

## 2020-07-06 NOTE — ED Provider Notes (Signed)
[facility]    CSN: [id number] Arrival date & time: 07/06/20  0931  History   Chief Complaint Chief Complaint  Patient presents with   Headache   HPI   79 year old female presents with headache and nasal congestion.  Patient reports that her symptoms started approximately 1 week ago.  Patient states that she has a diffuse headache which seems to come and go.  She has now run out of Tylenol.  Patient reports that she has had some nasal congestion but no rhinorrhea or other respiratory symptoms.  Patient is concerned that she may have COVID-19.  Patient desires testing today.  No other reported symptoms.  No other complaints at this time.  Past Medical History:  Diagnosis Date   Acute diverticulitis 10/02/2016   Anginal pain ([place])    Anxiety    Aortic atherosclerosis ([place])    Arthritis    Atherosclerotic heart disease    Depression    Diabetes mellitus without complication ([place])    Diverticulosis of colon    Dysrhythmia    GERD (gastroesophageal reflux disease)    Hiatal hernia    History of heart artery stent    History of shingles    HOH (hard of hearing)    Hyperlipidemia    Hypertension    Myocardial infarction [facility])    Sarcoidosis    Vitamin D deficiency     Patient Active Problem List   Diagnosis Date Noted   Status post exploratory laparotomy 10/21/2016   Myocardial infarction ([place]) 10/20/2016   SBO (small bowel obstruction) (HCC)    Aortic atherosclerosis ([place]) 10/02/2016   Diverticulosis of large intestine without hemorrhage 10/02/2016   Hammer toe of right foot 10/16/2015   Nail deformity 10/16/2015   Chronic obstructive pulmonary disease ([place]) 09/04/2015   History of MI (myocardial infarction) 09/04/2015   Spondylolisthesis at L4-L5 level 07/11/2015   DDD (degenerative disc disease), lumbar 07/11/2015   History of sarcoidosis 06/04/2015   Urge incontinence of urine 06/04/2015   Hearing loss of both ears  05/07/2015   Anxiety and depression 05/04/2015   Arteriosclerosis of coronary artery 05/04/2015   Type 2 diabetes mellitus with microalbuminuria, without long-term current use of insulin ([place]) 05/04/2015   Dyslipidemia 05/04/2015   Hypertension, benign 05/04/2015   Gastro-esophageal reflux disease without esophagitis 05/04/2015   Hiatal hernia 05/04/2015   HZV (herpes zoster virus) post herpetic neuralgia 05/04/2015   Vitamin D deficiency 05/04/2015   S/P coronary artery stent placement 05/04/2015    Past Surgical History:  Procedure Laterality Date   ABDOMINAL HYSTERECTOMY  1979   APPENDECTOMY     CARDIAC SURGERY     CATARACT EXTRACTION W/PHACO Right 10/28/2017   Procedure: CATARACT EXTRACTION PHACO AND INTRAOCULAR LENS PLACEMENT ([place]);  Surgeon: Eulogio Bear, MD;  Location: [facility] ORS;  Service: Ophthalmology;  Laterality: Right;  Lot # [id number] H [place]: 00:53.9 AP%: 10.6 CDE: 5.71   CORONARY ANGIOPLASTY     STENT   LAPAROTOMY N/A 10/04/2016   Procedure: EXPLORATORY LAPAROTOMY;  Surgeon: Dia Crawford III, MD;  Location: [facility] ORS;  Service: General;  Laterality: N/A;    OB History   No obstetric history on file.      Home Medications    Prior to Admission medications   Medication Sig Start Date End Date Taking? Authorizing Provider  amLODipine (NORVASC) 5 MG tablet Take 1 tablet (5 mg total) by mouth daily. 12/23/18  Yes Juline Patch, MD  aspirin EC 81 MG tablet Take 1 tablet (  81 mg total) by mouth daily. 12/29/16  Yes Juline Patch, MD  famotidine (PEPCID) 20 MG tablet Take 1 tablet (20 mg total) by mouth every morning. 03/16/19  Yes Hubbard Hartshorn, FNP  losartan (COZAAR) 50 MG tablet Take 1 tablet (50 mg total) by mouth daily. 12/23/18  Yes Juline Patch, MD  Multiple Vitamin (MULTIVITAMIN) capsule Take 1 capsule by mouth daily.   Yes [provider]  omeprazole (PRILOSEC) 20 MG capsule One twice a day 07/05/18  Yes Juline Patch, MD  pravastatin  (PRAVACHOL) 40 MG tablet Take 1 tablet (40 mg total) by mouth daily. 04/21/19  Yes Hubbard Hartshorn, FNP  sitaGLIPtin (JANUVIA) 100 MG tablet TAKE 1 TABLET (100 MG TOTAL) BY MOUTH DAILY. 07/10/19  Yes Hubbard Hartshorn, FNP  glucose blood (FREESTYLE LITE) test strip USE AS INSTRUCTED 11/13/19   Hubbard Hartshorn, FNP  ipratropium (ATROVENT) 0.06 % nasal spray Place 2 sprays into both nostrils 4 (four) times daily as needed for rhinitis. 07/06/20   Coral Spikes, DO  latanoprost (XALATAN) 0.005 % ophthalmic solution Place 1 drop into both eyes at bedtime.     [provider]  NITROSTAT 0.4 MG SL tablet DISSOLVE ONE TABLET UNDER TONGUE AS NEED FOR CHEST PAIN AS DIRECTED 11/28/15   Steele Sizer, MD    Family History Family History  Problem Relation Age of Onset   Diabetes Daughter    Hypertension Mother    Diabetes Son    Breast cancer Neg Hx     Social History Social History   Tobacco Use   Smoking status: Former Smoker    Packs/day: 2.00    Years: 10.00    Pack years: 20.00    Types: Cigarettes    Quit date: 11/16/1988    Years since quitting: 31.6   Smokeless tobacco: Never Used  Scientific laboratory technician Use: Never used  Substance Use Topics   Alcohol use: No    Alcohol/week: 0.0 standard drinks   Drug use: No     Allergies   Ace inhibitors, Codeine, Codeine sulfate, Ezetimibe, and Other   Review of Systems Review of Systems  HENT: Positive for congestion.   Neurological: Positive for headaches.   Physical Exam Triage Vital Signs ED Triage Vitals  Enc Vitals Group     BP 07/06/20 0949 138/70     Pulse Rate 07/06/20 0949 68     Resp 07/06/20 0949 14     Temp 07/06/20 0949 98.7 F (37.1 C)     Temp Source 07/06/20 0949 Oral     SpO2 07/06/20 0949 100 %     Weight 07/06/20 0946 164 lb (74.4 kg)     Height 07/06/20 0946 5\' 11"  (1.803 m)     Head Circumference --      Peak Flow --      Pain Score 07/06/20 0945 8     Pain Loc --      Pain Edu? --      Excl.  in [place]? --    Updated Vital Signs BP 138/70 (BP Location: Right Arm)    Pulse 68    Temp 98.7 F (37.1 C) (Oral)    Resp 14    Ht 5\' 11"  (1.803 m)    Wt 74.4 kg    SpO2 100%    BMI 22.87 kg/m   Visual Acuity Right Eye Distance:   Left Eye Distance:   Bilateral Distance:    Right  Eye Near:   Left Eye Near:    Bilateral Near:     Physical Exam Vitals and nursing note reviewed.  Constitutional:      General: She is not in acute distress.    Appearance: She is well-developed. She is not ill-appearing.  HENT:     Head: Normocephalic and atraumatic.     Right Ear: Tympanic membrane normal.     Left Ear: Tympanic membrane normal.  Eyes:     General:        Right eye: No discharge.        Left eye: No discharge.     Conjunctiva/sclera: Conjunctivae normal.  Cardiovascular:     Rate and Rhythm: Normal rate and regular rhythm.  Pulmonary:     Effort: Pulmonary effort is normal.     Breath sounds: Normal breath sounds. No wheezing or rales.  Neurological:     Mental Status: She is alert.  Psychiatric:        Mood and Affect: Mood normal.        Behavior: Behavior normal.    UC Treatments / Results  Labs (all labs ordered are listed, but only abnormal results are displayed) Labs Reviewed  SARS CORONAVIRUS 2 (TAT 6-24 HRS)    EKG   Radiology No results found.  Procedures Procedures (including critical care time)  Medications Ordered in UC Medications - No data to display  Initial Impression / Assessment and Plan / UC Course  I have reviewed the triage vital signs and the nursing notes.  Pertinent labs & imaging results that were available during my care of the patient were reviewed by me and considered in my medical decision making (see chart for details).    79 year old female presents with headache and nasal congestion.  Tylenol as needed.  Atrovent nasal spray for the congestion.  Awaiting Covid test result.  Final Clinical Impressions(s) / UC Diagnoses    Final diagnoses:  Acute nonintractable headache, unspecified headache type  Nasal congestion     Discharge Instructions     Tylenol 1000 mg three times daily as needed.  Medication as directed for congestion.  COVID test will be back tomorrow.  Take care  Dr. Lacinda Axon    ED Prescriptions    Medication Sig Dispense Auth. Provider   ipratropium (ATROVENT) 0.06 % nasal spray Place 2 sprays into both nostrils 4 (four) times daily as needed for rhinitis. 15 mL Coral Spikes, DO     PDMP not reviewed this encounter.   Coral Spikes, [place] 07/06/20 1127

## 2020-07-06 NOTE — Discharge Instructions (Signed)
Tylenol 1000 mg three times daily as needed.  Medication as directed for congestion.  COVID test will be back tomorrow.  Take care  Dr. Lacinda Axon

## 2020-07-06 NOTE — ED Triage Notes (Signed)
Patient c/o headache that started a week ago.  Patient denies any cold symptoms.  Patient denies fevers.

## 2020-07-07 LAB — SARS CORONAVIRUS 2 (TAT 6-24 HRS): SARS Coronavirus 2: NEGATIVE

## 2020-07-12 DIAGNOSIS — K219 Gastro-esophageal reflux disease without esophagitis: Secondary | ICD-10-CM | POA: Diagnosis not present

## 2020-07-12 DIAGNOSIS — R58 Hemorrhage, not elsewhere classified: Secondary | ICD-10-CM | POA: Diagnosis not present

## 2020-07-12 DIAGNOSIS — E118 Type 2 diabetes mellitus with unspecified complications: Secondary | ICD-10-CM | POA: Diagnosis not present

## 2020-07-12 DIAGNOSIS — I10 Essential (primary) hypertension: Secondary | ICD-10-CM | POA: Diagnosis not present

## 2020-07-12 DIAGNOSIS — D509 Iron deficiency anemia, unspecified: Secondary | ICD-10-CM | POA: Diagnosis not present

## 2020-07-12 DIAGNOSIS — R1030 Lower abdominal pain, unspecified: Secondary | ICD-10-CM | POA: Diagnosis not present

## 2020-07-17 DIAGNOSIS — M1711 Unilateral primary osteoarthritis, right knee: Secondary | ICD-10-CM | POA: Diagnosis not present

## 2020-07-17 DIAGNOSIS — E118 Type 2 diabetes mellitus with unspecified complications: Secondary | ICD-10-CM | POA: Diagnosis not present

## 2020-07-26 DIAGNOSIS — I10 Essential (primary) hypertension: Secondary | ICD-10-CM | POA: Diagnosis not present

## 2020-07-26 DIAGNOSIS — J449 Chronic obstructive pulmonary disease, unspecified: Secondary | ICD-10-CM | POA: Diagnosis not present

## 2020-07-26 DIAGNOSIS — E118 Type 2 diabetes mellitus with unspecified complications: Secondary | ICD-10-CM | POA: Diagnosis not present

## 2020-07-26 DIAGNOSIS — K219 Gastro-esophageal reflux disease without esophagitis: Secondary | ICD-10-CM | POA: Diagnosis not present

## 2020-08-08 ENCOUNTER — Other Ambulatory Visit: Payer: Self-pay | Admitting: Cardiology

## 2020-08-08 DIAGNOSIS — I2510 Atherosclerotic heart disease of native coronary artery without angina pectoris: Secondary | ICD-10-CM

## 2020-08-08 DIAGNOSIS — R0609 Other forms of dyspnea: Secondary | ICD-10-CM

## 2020-08-08 DIAGNOSIS — R0789 Other chest pain: Secondary | ICD-10-CM

## 2020-08-15 DIAGNOSIS — M19032 Primary osteoarthritis, left wrist: Secondary | ICD-10-CM | POA: Diagnosis not present

## 2020-08-15 DIAGNOSIS — M25432 Effusion, left wrist: Secondary | ICD-10-CM | POA: Diagnosis not present

## 2020-08-15 DIAGNOSIS — M7989 Other specified soft tissue disorders: Secondary | ICD-10-CM | POA: Diagnosis not present

## 2020-08-21 ENCOUNTER — Ambulatory Visit
Admission: RE | Admit: 2020-08-21 | Discharge: 2020-08-21 | Disposition: A | Discharge status: Home / Self Care | Payer: 59 | Source: Ambulatory Visit | Attending: Cardiology | Admitting: Cardiology

## 2020-08-21 ENCOUNTER — Other Ambulatory Visit: Payer: Self-pay

## 2020-08-21 DIAGNOSIS — R0609 Other forms of dyspnea: Secondary | ICD-10-CM

## 2020-08-21 DIAGNOSIS — R0600 Dyspnea, unspecified: Secondary | ICD-10-CM | POA: Insufficient documentation

## 2020-08-21 DIAGNOSIS — R0789 Other chest pain: Secondary | ICD-10-CM | POA: Insufficient documentation

## 2020-08-21 DIAGNOSIS — I2510 Atherosclerotic heart disease of native coronary artery without angina pectoris: Secondary | ICD-10-CM | POA: Insufficient documentation

## 2020-08-21 MED ORDER — TECHNETIUM TC 99M TETROFOSMIN IV KIT: 10.0000 | PACK | Freq: Once | INTRAVENOUS | Status: DC | PRN

## 2020-08-26 ENCOUNTER — Other Ambulatory Visit: Payer: Self-pay | Admitting: Ophthalmology

## 2020-08-28 ENCOUNTER — Other Ambulatory Visit: Payer: Self-pay

## 2020-08-28 ENCOUNTER — Encounter
Admission: RE | Admit: 2020-08-28 | Discharge: 2020-08-28 | Disposition: A | Discharge status: Home / Self Care | Payer: 59 | Source: Ambulatory Visit | Attending: Cardiology | Admitting: Cardiology

## 2020-08-28 DIAGNOSIS — R0600 Dyspnea, unspecified: Secondary | ICD-10-CM | POA: Diagnosis not present

## 2020-08-28 DIAGNOSIS — R0789 Other chest pain: Secondary | ICD-10-CM | POA: Insufficient documentation

## 2020-08-28 DIAGNOSIS — I2510 Atherosclerotic heart disease of native coronary artery without angina pectoris: Secondary | ICD-10-CM | POA: Diagnosis not present

## 2020-08-28 MED ORDER — TECHNETIUM TC 99M TETROFOSMIN IV KIT
10.0000 | PACK | Freq: Once | INTRAVENOUS | Status: AC | PRN
  Administered 2020-08-28: 10 via INTRAVENOUS

## 2020-08-28 MED ORDER — TECHNETIUM TC 99M TETROFOSMIN IV KIT
30.0000 | PACK | Freq: Once | INTRAVENOUS | Status: AC | PRN
  Administered 2020-08-28: 28.436 via INTRAVENOUS

## 2020-08-28 MED ORDER — REGADENOSON 0.4 MG/5ML IV SOLN
0.4000 mg | Freq: Once | INTRAVENOUS | Status: AC
  Administered 2020-08-28: 0.4 mg via INTRAVENOUS

## 2020-08-29 DIAGNOSIS — E782 Mixed hyperlipidemia: Secondary | ICD-10-CM | POA: Diagnosis not present

## 2020-08-29 DIAGNOSIS — I2510 Atherosclerotic heart disease of native coronary artery without angina pectoris: Secondary | ICD-10-CM | POA: Diagnosis not present

## 2020-08-29 DIAGNOSIS — I10 Essential (primary) hypertension: Secondary | ICD-10-CM | POA: Diagnosis not present

## 2020-08-29 DIAGNOSIS — I700 Atherosclerosis of aorta: Secondary | ICD-10-CM | POA: Diagnosis not present

## 2020-08-29 LAB — NM MYOCAR MULTI W/SPECT W/WALL MOTION / EF
Estimated workload: 1 METS
Exercise duration (min): 1 min
Exercise duration (sec): 4 s
LV dias vol: 59 mL (ref 46–106)
LV sys vol: 19 mL
MPHR: 142 {beats}/min
Peak HR: 99 {beats}/min
Percent HR: 69 %
Rest HR: 61 {beats}/min
SDS: 4
SRS: 11
SSS: 9
TID: 0.91

## 2020-08-30 DIAGNOSIS — I2510 Atherosclerotic heart disease of native coronary artery without angina pectoris: Secondary | ICD-10-CM | POA: Diagnosis not present

## 2020-08-30 DIAGNOSIS — E782 Mixed hyperlipidemia: Secondary | ICD-10-CM | POA: Diagnosis not present

## 2020-09-20 ENCOUNTER — Other Ambulatory Visit: Payer: Self-pay | Admitting: Family Medicine

## 2020-09-20 DIAGNOSIS — Z20822 Contact with and (suspected) exposure to covid-19: Secondary | ICD-10-CM | POA: Diagnosis not present

## 2020-09-20 DIAGNOSIS — J069 Acute upper respiratory infection, unspecified: Secondary | ICD-10-CM | POA: Diagnosis not present

## 2020-10-07 ENCOUNTER — Other Ambulatory Visit: Payer: Self-pay | Admitting: Family Medicine

## 2020-10-17 DIAGNOSIS — B351 Tinea unguium: Secondary | ICD-10-CM | POA: Diagnosis not present

## 2020-10-17 DIAGNOSIS — M79672 Pain in left foot: Secondary | ICD-10-CM | POA: Diagnosis not present

## 2020-10-17 DIAGNOSIS — M79671 Pain in right foot: Secondary | ICD-10-CM | POA: Diagnosis not present

## 2020-10-22 ENCOUNTER — Other Ambulatory Visit: Payer: Self-pay | Admitting: Family Medicine

## 2020-10-22 DIAGNOSIS — K5790 Diverticulosis of intestine, part unspecified, without perforation or abscess without bleeding: Secondary | ICD-10-CM | POA: Diagnosis not present

## 2020-10-22 DIAGNOSIS — R630 Anorexia: Secondary | ICD-10-CM | POA: Diagnosis not present

## 2020-10-22 DIAGNOSIS — R1032 Left lower quadrant pain: Secondary | ICD-10-CM | POA: Diagnosis not present

## 2020-10-25 ENCOUNTER — Other Ambulatory Visit (HOSPITAL_COMMUNITY): Payer: Self-pay | Admitting: Family Medicine

## 2020-10-25 ENCOUNTER — Other Ambulatory Visit: Payer: Self-pay

## 2020-10-25 ENCOUNTER — Other Ambulatory Visit: Payer: Self-pay | Admitting: Family Medicine

## 2020-10-25 ENCOUNTER — Ambulatory Visit
Admission: RE | Admit: 2020-10-25 | Discharge: 2020-10-25 | Disposition: A | Discharge status: Home / Self Care | Payer: 59 | Source: Ambulatory Visit | Attending: Family Medicine | Admitting: Family Medicine

## 2020-10-25 DIAGNOSIS — N281 Cyst of kidney, acquired: Secondary | ICD-10-CM | POA: Diagnosis not present

## 2020-10-25 DIAGNOSIS — R1031 Right lower quadrant pain: Secondary | ICD-10-CM | POA: Diagnosis not present

## 2020-10-25 DIAGNOSIS — R195 Other fecal abnormalities: Secondary | ICD-10-CM | POA: Diagnosis not present

## 2020-10-25 DIAGNOSIS — R1032 Left lower quadrant pain: Secondary | ICD-10-CM

## 2020-10-25 DIAGNOSIS — K5730 Diverticulosis of large intestine without perforation or abscess without bleeding: Secondary | ICD-10-CM | POA: Diagnosis not present

## 2020-10-25 DIAGNOSIS — I700 Atherosclerosis of aorta: Secondary | ICD-10-CM | POA: Diagnosis not present

## 2020-10-25 DIAGNOSIS — Z90710 Acquired absence of both cervix and uterus: Secondary | ICD-10-CM | POA: Diagnosis not present

## 2020-10-25 MED ORDER — IOHEXOL 300 MG/ML  SOLN
100.0000 mL | Freq: Once | INTRAMUSCULAR | Status: AC | PRN
  Administered 2020-10-25: 100 mL via INTRAVENOUS

## 2020-10-31 ENCOUNTER — Other Ambulatory Visit: Payer: Self-pay | Admitting: Family Medicine

## 2020-10-31 DIAGNOSIS — H401131 Primary open-angle glaucoma, bilateral, mild stage: Secondary | ICD-10-CM | POA: Diagnosis not present

## 2020-11-04 ENCOUNTER — Other Ambulatory Visit: Payer: Self-pay | Admitting: Family Medicine

## 2020-11-04 DIAGNOSIS — I2510 Atherosclerotic heart disease of native coronary artery without angina pectoris: Secondary | ICD-10-CM | POA: Diagnosis not present

## 2020-11-04 DIAGNOSIS — R079 Chest pain, unspecified: Secondary | ICD-10-CM | POA: Diagnosis not present

## 2020-11-04 DIAGNOSIS — I214 Non-ST elevation (NSTEMI) myocardial infarction: Secondary | ICD-10-CM | POA: Diagnosis not present

## 2020-11-04 DIAGNOSIS — I10 Essential (primary) hypertension: Secondary | ICD-10-CM | POA: Diagnosis not present

## 2020-11-14 ENCOUNTER — Other Ambulatory Visit: Payer: Self-pay | Admitting: Family Medicine

## 2020-11-14 DIAGNOSIS — Z1231 Encounter for screening mammogram for malignant neoplasm of breast: Secondary | ICD-10-CM

## 2020-11-20 DIAGNOSIS — U071 COVID-19: Secondary | ICD-10-CM | POA: Diagnosis not present

## 2020-11-25 DIAGNOSIS — J449 Chronic obstructive pulmonary disease, unspecified: Secondary | ICD-10-CM | POA: Diagnosis not present

## 2020-11-25 DIAGNOSIS — Z8616 Personal history of COVID-19: Secondary | ICD-10-CM | POA: Diagnosis not present

## 2020-11-25 DIAGNOSIS — E118 Type 2 diabetes mellitus with unspecified complications: Secondary | ICD-10-CM | POA: Diagnosis not present

## 2020-11-25 DIAGNOSIS — K219 Gastro-esophageal reflux disease without esophagitis: Secondary | ICD-10-CM | POA: Diagnosis not present

## 2020-11-29 ENCOUNTER — Other Ambulatory Visit: Payer: Self-pay | Admitting: *Deleted

## 2020-11-29 NOTE — Patient Outreach (Signed)
[facility]) Care Management  11/29/2020  BESSE MIRON 07/12/1941 [id number]   Telephone assessment Care coordination   Referral received : 11/29/20 Referral source: [facility] answering service, "patient returning missed call " Insurance: UMR, Medicare A &B  Outreach attempt #1 Subjective: Successful outreach call to patient, explained reason for the call, [facility] care management services for [employer] on insurance plan, HIPAA confirmed.  Patient discussed receiving a call from a number while she was on another call, she was unable to recall the number, states she was just trying to figure out who was trying to reach her.  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  She discussed that she has made contact with [employer] for FMLA and received a call on this morning.  Offered assistance with additional contact information regarding benefits she denies need.  Patient reports improvement with symptoms, just still feeling weak, reinforced continued nutrition, staying hydrating and rest. She denies fever, cough or shortness of breath.     Plan Will send patient successful outreach letter with my contact number is new questions arise. No ongoing care management needs identified at this time.    Joylene Draft, RN, BSN  [place] Management Coordinator  [phone]- Mobile [phone]- Toll Free Main Office

## 2020-12-06 ENCOUNTER — Other Ambulatory Visit: Payer: Self-pay | Admitting: *Deleted

## 2020-12-06 NOTE — Patient Outreach (Signed)
[facility]) Care Management  12/06/2020  Breanna Petty 24-Nov-1940 [id number]   Telephone Screening  Care coordination   Referral received : 11/29/20 Referral source : Nurse call center , [place] [facility] answering service, "patient returning missed call " Insurance: UMR, Medicare A &B  Outreach attempt  Subjective:  Unsuccessful outreach all to patient , no answer able to leave a HIPAA compliant voicemail message for return call.   Plan Will await return call if no response will plan outreach call in the next 4 business days. Patient has been sent [facility] outreach letter in the last week.    Joylene Draft, RN, BSN  [place] Management Coordinator  [phone]- Mobile [phone]- Toll Free Main Office

## 2020-12-11 ENCOUNTER — Other Ambulatory Visit: Payer: Self-pay | Admitting: *Deleted

## 2020-12-11 NOTE — Patient Outreach (Signed)
[place] [facility]) Care Management  12/11/2020  Breanna Petty [birth year] [id number]   Telephone Screening  Care coordination   Referral received : 11/29/20 Referral source : Nurse call center , [place] answering service, "patient returning missed call " Insurance: UMR, Medicare A &B   Outreach Attempt #2 Subjective : Outreach call to patient unsuccessful no answer able to leave a HIPAA compliant voicemail message for return call.    Plan If no return call will schedule 3rd outreach in the next 4 business days.    Joylene Draft, RN, BSN  [place] Management Coordinator  [phone]- Mobile [phone]- Toll Free Main Office

## 2020-12-16 ENCOUNTER — Other Ambulatory Visit: Payer: Self-pay | Admitting: *Deleted

## 2020-12-16 NOTE — Patient Outreach (Signed)
[place] [facility]) Care Management  12/16/2020  NATHALIA WISMER Nov 30, 1940 [id number]   Telephone Screening Care coordination   Referral received : 11/29/20 Referral source :Nurse call center ,[place] answering service, "patient returning missed call " Insurance: UMR, Medicare A &B   Outreach Attempt #3 Subjective : Outreach call to patient unsuccessful no answer able to leave a HIPAA compliant voicemail message for return call.    Plan If no return call will schedule 4th outreach in the next 3 weeks.    Joylene Draft, RN, BSN  [place] Management Coordinator  [phone]- Mobile [phone]- Toll Free Main Office

## 2020-12-18 ENCOUNTER — Other Ambulatory Visit: Payer: Self-pay | Admitting: Family Medicine

## 2020-12-18 DIAGNOSIS — R413 Other amnesia: Secondary | ICD-10-CM | POA: Diagnosis not present

## 2020-12-18 DIAGNOSIS — J019 Acute sinusitis, unspecified: Secondary | ICD-10-CM | POA: Diagnosis not present

## 2020-12-20 ENCOUNTER — Other Ambulatory Visit: Payer: Self-pay | Admitting: *Deleted

## 2020-12-20 NOTE — Patient Outreach (Signed)
[facility]) Care Management  12/20/2020  Breanna Petty February 24, 1941 [id number]  Telephone screening   Referral received : 11/29/20 Referral source :Nurse call center ,[place] answering service," Patient is calling to give a update on her health and current meds shes taking. " Insurance: UMR, Medicare A &B  Subjective: Successful outreach call to patient , explained reason for the call. Patient states that she was trying to reach the nurse that had been calling her to update on medication and health  . She discussed a  nurse has been calling her every three months. Discussed with patient Health coach program she acknowledge being aware and that she was able to speak with Nurse Coach later yesterday to update her, at scheduled appointment at 5:30, she thought she had missed her call.  Reviewed with patient her next Nurse Coach call scheduled for  02/27/21 at 5:30 pm , provided Nurse coach contact number, she was able to write down information.  Breanna Petty voiced appreciation for this call.  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  She reports continuing to monitor her blood sugar each morning and readings doing  okay . She discussed using [facility] employee pharmacy to get strips for meter, she asked about getting reimbursement, whether  they will send her a check, discussed quarterly reimbursement in her payroll check she voiced understanding .   Breanna Petty denies any other concerns.   Plan Will plan case close to [facility] care management, encouraged patient to contact care coordinator for new concerns contact number provided, and will send successful outreach letter.   Joylene Draft, RN, BSN  [place] Management Coordinator  [phone]- Mobile [phone]- Toll Free Main Office

## 2020-12-23 ENCOUNTER — Other Ambulatory Visit: Payer: Self-pay | Admitting: Family Medicine

## 2020-12-26 ENCOUNTER — Other Ambulatory Visit: Payer: Self-pay

## 2020-12-26 ENCOUNTER — Ambulatory Visit
Admission: RE | Admit: 2020-12-26 | Discharge: 2020-12-26 | Disposition: A | Discharge status: Home / Self Care | Payer: 59 | Source: Ambulatory Visit | Attending: Family Medicine | Admitting: Family Medicine

## 2020-12-26 DIAGNOSIS — G319 Degenerative disease of nervous system, unspecified: Secondary | ICD-10-CM | POA: Diagnosis not present

## 2020-12-26 DIAGNOSIS — R413 Other amnesia: Secondary | ICD-10-CM | POA: Insufficient documentation

## 2020-12-26 DIAGNOSIS — J0130 Acute sphenoidal sinusitis, unspecified: Secondary | ICD-10-CM | POA: Diagnosis not present

## 2020-12-26 DIAGNOSIS — J3489 Other specified disorders of nose and nasal sinuses: Secondary | ICD-10-CM | POA: Diagnosis not present

## 2021-01-02 DIAGNOSIS — I2510 Atherosclerotic heart disease of native coronary artery without angina pectoris: Secondary | ICD-10-CM | POA: Diagnosis not present

## 2021-01-02 DIAGNOSIS — I10 Essential (primary) hypertension: Secondary | ICD-10-CM | POA: Diagnosis not present

## 2021-01-02 DIAGNOSIS — I700 Atherosclerosis of aorta: Secondary | ICD-10-CM | POA: Diagnosis not present

## 2021-01-02 DIAGNOSIS — I214 Non-ST elevation (NSTEMI) myocardial infarction: Secondary | ICD-10-CM | POA: Diagnosis not present

## 2021-01-02 DIAGNOSIS — S139XXA Sprain of joints and ligaments of unspecified parts of neck, initial encounter: Secondary | ICD-10-CM | POA: Diagnosis not present

## 2021-01-06 ENCOUNTER — Ambulatory Visit: Payer: Self-pay | Admitting: *Deleted

## 2021-01-09 DIAGNOSIS — Z0000 Encounter for general adult medical examination without abnormal findings: Secondary | ICD-10-CM | POA: Diagnosis not present

## 2021-01-09 DIAGNOSIS — R946 Abnormal results of thyroid function studies: Secondary | ICD-10-CM | POA: Diagnosis not present

## 2021-01-09 DIAGNOSIS — E118 Type 2 diabetes mellitus with unspecified complications: Secondary | ICD-10-CM | POA: Diagnosis not present

## 2021-01-28 ENCOUNTER — Ambulatory Visit
Admission: RE | Admit: 2021-01-28 | Discharge: 2021-01-28 | Disposition: A | Discharge status: Home / Self Care | Payer: 59 | Source: Ambulatory Visit | Attending: Family Medicine | Admitting: Family Medicine

## 2021-01-28 ENCOUNTER — Other Ambulatory Visit: Payer: Self-pay | Admitting: Family Medicine

## 2021-01-28 ENCOUNTER — Other Ambulatory Visit (HOSPITAL_COMMUNITY): Payer: Self-pay | Admitting: Family Medicine

## 2021-01-28 ENCOUNTER — Other Ambulatory Visit: Payer: Self-pay

## 2021-01-28 DIAGNOSIS — M79661 Pain in right lower leg: Secondary | ICD-10-CM

## 2021-01-28 DIAGNOSIS — I10 Essential (primary) hypertension: Secondary | ICD-10-CM | POA: Diagnosis not present

## 2021-01-28 DIAGNOSIS — M79604 Pain in right leg: Secondary | ICD-10-CM | POA: Diagnosis not present

## 2021-01-28 DIAGNOSIS — E118 Type 2 diabetes mellitus with unspecified complications: Secondary | ICD-10-CM | POA: Diagnosis not present

## 2021-02-07 DIAGNOSIS — R32 Unspecified urinary incontinence: Secondary | ICD-10-CM | POA: Diagnosis not present

## 2021-02-07 DIAGNOSIS — G3184 Mild cognitive impairment, so stated: Secondary | ICD-10-CM | POA: Diagnosis not present

## 2021-02-15 ENCOUNTER — Ambulatory Visit
Admission: EM | Admit: 2021-02-15 | Discharge: 2021-02-15 | Disposition: A | Discharge status: Home / Self Care | Payer: 59 | Attending: Sports Medicine | Admitting: Sports Medicine

## 2021-02-15 ENCOUNTER — Other Ambulatory Visit: Payer: Self-pay

## 2021-02-15 ENCOUNTER — Encounter: Payer: Self-pay | Admitting: Emergency Medicine

## 2021-02-15 DIAGNOSIS — M62838 Other muscle spasm: Secondary | ICD-10-CM | POA: Diagnosis not present

## 2021-02-15 DIAGNOSIS — M542 Cervicalgia: Secondary | ICD-10-CM

## 2021-02-15 DIAGNOSIS — M5412 Radiculopathy, cervical region: Secondary | ICD-10-CM | POA: Diagnosis not present

## 2021-02-15 MED ORDER — PREDNISONE 5 MG PO TABS: ORAL_TABLET | ORAL | 0 refills | Status: DC

## 2021-02-15 NOTE — ED Triage Notes (Signed)
Patient in today c/o headache x last night and left sided neck pain x 1 week. Patient states headache has resolved, but the pain in her neck is still there. Patient took OTC Tylenol last night and this morning.

## 2021-02-15 NOTE — ED Provider Notes (Signed)
[facility]    CSN: [id number] Arrival date & time: 02/15/21  3790      History   Chief Complaint Chief Complaint  Patient presents with  . Headache  . Neck Pain    HPI Breanna Petty is a 80 y.o. female.   Patient is a pleasant 80 year old female who presents for evaluation of the above issues.  Patient reports that she has had several days of left-sided posterior neck pain.  She denies any accidents trauma or falls.  It was worse last night and she developed a little bit of a headache.  That has resolved.  She been taking Tylenol.  She does live alone but she has family members close by.  Normally sees [facility] in [place] for ongoing primary care needs.  Comes in today because the back of her neck going into her trapezius is been bothering her enough that she is seeking out medical attention.  She denies any arm pain numbness or tingling.  No chest pain shortness of breath.  No jaw pain diaphoresis or sense of impending doom.  That said, she does have a significant cardiac history and has had 2 stents placed.  She denies any weakness in that arm.  Review of the medical records indicated that she did see her primary care provider about 6 weeks ago and they recommended supportive care including moist heat and Tylenol.  That seemed to help until recently.  She continues to work, and is employed by [employer].  She does do some cleaning around the hospital.  No red flag signs or symptoms elicited on history.  She denies any injury in the workplace.     Past Medical History:  Diagnosis Date  . Acute diverticulitis 10/02/2016  . Anginal pain ([place])   . Anxiety   . Aortic atherosclerosis ([place])   . Arthritis   . Atherosclerotic heart disease   . Depression   . Diabetes mellitus without complication ([place])   . Diverticulosis of colon   . Dysrhythmia   . GERD (gastroesophageal reflux disease)   . Hiatal hernia   . History of heart artery stent   . History of  shingles   . HOH (hard of hearing)   . Hyperlipidemia   . Hypertension   . Myocardial infarction ([place])   . Sarcoidosis   . Vitamin D deficiency     Patient Active Problem List   Diagnosis Date Noted  . Status post exploratory laparotomy 10/21/2016  . Myocardial infarction ([place]) 10/20/2016  . SBO (small bowel obstruction) ([place])   . Aortic atherosclerosis ([place]) 10/02/2016  . Diverticulosis of large intestine without hemorrhage 10/02/2016  . Hammer toe of right foot 10/16/2015  . Nail deformity 10/16/2015  . Chronic obstructive pulmonary disease ([place]) 09/04/2015  . History of MI (myocardial infarction) 09/04/2015  . Spondylolisthesis at L4-L5 level 07/11/2015  . DDD (degenerative disc disease), lumbar 07/11/2015  . History of sarcoidosis 06/04/2015  . Urge incontinence of urine 06/04/2015  . Hearing loss of both ears 05/07/2015  . Anxiety and depression 05/04/2015  . Arteriosclerosis of coronary artery 05/04/2015  . Type 2 diabetes mellitus with microalbuminuria, without long-term current use of insulin ([place]) 05/04/2015  . Dyslipidemia 05/04/2015  . Hypertension, benign 05/04/2015  . Gastro-esophageal reflux disease without esophagitis 05/04/2015  . Hiatal hernia 05/04/2015  . HZV (herpes zoster virus) post herpetic neuralgia 05/04/2015  . Vitamin D deficiency 05/04/2015  . S/P coronary artery stent placement 05/04/2015    Past Surgical  History:  Procedure Laterality Date  . ABDOMINAL HYSTERECTOMY  1979  . APPENDECTOMY    . CARDIAC SURGERY    . CATARACT EXTRACTION W/PHACO Right 10/28/2017   Procedure: CATARACT EXTRACTION PHACO AND INTRAOCULAR LENS PLACEMENT (IOC);  Surgeon: Eulogio Bear, MD;  Location: [facility] ORS;  Service: Ophthalmology;  Laterality: Right;  Lot # [id number] H [place]: 00:53.9 AP%: 10.6 CDE: 5.71  . CORONARY ANGIOPLASTY     STENT  . LAPAROTOMY N/A 10/04/2016   Procedure: EXPLORATORY LAPAROTOMY;  Surgeon: Dia Crawford III, MD;  Location: [facility] ORS;  Service:  General;  Laterality: N/A;    OB History   No obstetric history on file.      Home Medications    Prior to Admission medications   Medication Sig Start Date End Date Taking? Authorizing Provider  amLODipine (NORVASC) 5 MG tablet TAKE 1 TABLET BY MOUTH ONCE DAILY 11/04/20 11/04/21 Yes Whitaker, Jason Hestle, PA-C  aspirin EC 81 MG tablet Take 1 tablet (81 mg total) by mouth daily. 12/29/16  Yes Otilio Miu C, MD  glucose blood (FREESTYLE LITE) test strip USE AS INSTRUCTED 11/13/19  Yes Hubbard Hartshorn, FNP  ipratropium (ATROVENT) 0.06 % nasal spray Place 2 sprays into both nostrils 4 (four) times daily as needed for rhinitis. 07/06/20  Yes Cook, Jayce G, DO  Lancets (FREESTYLE) lancets TEST 2 TIMES A DAY Patient taking differently: 2 (two) times daily. for testing 12/23/20 12/23/21 Yes Whitaker, Corene Cornea Hestle, PA-C  latanoprost (XALATAN) 0.005 % ophthalmic solution PLACE 1 DROP INTO BOTH EYES AT BEDTIME 08/26/20 08/26/21 Yes Eulogio Bear, MD  losartan (COZAAR) 50 MG tablet TAKE 1 TABLET (50 MG TOTAL) BY MOUTH ONCE DAILY 10/07/20 10/07/21 Yes Whitaker, Jason Hestle, PA-C  Multiple Vitamin (MULTIVITAMIN) capsule Take 1 capsule by mouth daily.   Yes [provider]  NITROSTAT 0.4 MG SL tablet DISSOLVE ONE TABLET UNDER TONGUE AS NEED FOR CHEST PAIN AS DIRECTED 11/28/15  Yes Sowles, Drue Stager, MD  pantoprazole (PROTONIX) 40 MG tablet TAKE 1 TABLET BY MOUTH 2 TIMES DAILY BEFORE MEALS 10/31/20 10/31/21 Yes Whitaker, Jason Hestle, PA-C  pravastatin (PRAVACHOL) 40 MG tablet Take 1 tablet (40 mg total) by mouth daily. 04/21/19  Yes Hubbard Hartshorn, FNP  sitaGLIPtin (JANUVIA) 100 MG tablet TAKE 1 TABLET (100 MG TOTAL) BY MOUTH ONCE DAILY 05/16/20 05/16/21 Yes Whitaker, Jason Hestle, PA-C  amLODipine (NORVASC) 5 MG tablet Take 1 tablet (5 mg total) by mouth daily. 12/23/18   Juline Patch, MD  amoxicillin (AMOXIL) 875 MG tablet TAKE 1 TABLET (875 MG TOTAL) BY MOUTH EVERY 12 (TWELVE) HOURS FOR 7 DAYS  09/20/20 09/20/21  Whitaker, Corene Cornea Hestle, PA-C  azithromycin (ZITHROMAX) 250 MG tablet TAKE 2 TABLETS BY MOUTH ON DAY 1 THEN TAKE 1 TABLET DAILY FOR THE NEXT 4 DAYS Patient taking differently: Take by mouth daily. for the next 4 days 12/18/20 12/18/21  Grayland Ormond Hestle, PA-C  ciprofloxacin (CIPRO) 500 MG tablet TAKE 1 TABLET (500 MG TOTAL) BY MOUTH 2 (TWO) TIMES DAILY FOR 10 DAYS 10/22/20 10/22/21  Whitaker, Corene Cornea Hestle, PA-C  famotidine (PEPCID) 20 MG tablet Take 1 tablet (20 mg total) by mouth every morning. 03/16/19   Hubbard Hartshorn, FNP  latanoprost (XALATAN) 0.005 % ophthalmic solution Place 1 drop into both eyes at bedtime.     [provider]  losartan (COZAAR) 50 MG tablet Take 1 tablet (50 mg total) by mouth daily. 12/23/18   Juline Patch, MD  metroNIDAZOLE (FLAGYL) 500 MG tablet  TAKE 1 TABLET (500 MG TOTAL) BY MOUTH 3 (THREE) TIMES DAILY FOR 10 DAYS 10/22/20 10/22/21  Whitaker, Corene Cornea Hestle, PA-C  omeprazole (PRILOSEC) 20 MG capsule One twice a day 07/05/18   Juline Patch, MD  predniSONE (DELTASONE) 5 MG tablet Take 6 tablets for 2 days, then 4 tablets for 2 days, then 2 tablets for 2 days, then 1 tablet for 2 days until finished. 02/15/21   Verda Cumins, MD  sitaGLIPtin (JANUVIA) 100 MG tablet TAKE 1 TABLET (100 MG TOTAL) BY MOUTH DAILY. 07/10/19   Hubbard Hartshorn, FNP    Family History Family History  Problem Relation Age of Onset  . Diabetes Daughter   . Hypertension Mother   . Other Father        MVA  . Diabetes Son   . Breast cancer Neg Hx     Social History Social History   Tobacco Use  . Smoking status: Former Smoker    Packs/day: 2.00    Years: 10.00    Pack years: 20.00    Types: Cigarettes    Quit date: 11/16/1988    Years since quitting: 32.2  . Smokeless tobacco: Never Used  Vaping Use  . Vaping Use: Never used  Substance Use Topics  . Alcohol use: No    Alcohol/week: 0.0 standard drinks  . Drug use: No     Allergies   Ace inhibitors, Codeine,  Codeine sulfate, Ezetimibe, and Other   Review of Systems Review of Systems  Constitutional: Positive for activity change. Negative for appetite change, chills, fatigue and fever.  HENT: Negative.  Negative for congestion.   Eyes: Negative for photophobia, pain and visual disturbance.  Respiratory: Negative.  Negative for cough, chest tightness, shortness of breath, wheezing and stridor.   Cardiovascular: Negative.  Negative for chest pain and palpitations.  Gastrointestinal: Negative.  Negative for abdominal pain.  Genitourinary: Negative.  Negative for dysuria.  Musculoskeletal: Positive for neck pain and neck stiffness. Negative for arthralgias, back pain, gait problem, joint swelling and myalgias.  Skin: Negative.  Negative for color change, pallor, rash and wound.  Neurological: Positive for headaches. Negative for dizziness, tremors, seizures, syncope, speech difficulty, weakness, light-headedness and numbness.  All other systems reviewed and are negative.    Physical Exam Triage Vital Signs ED Triage Vitals  Enc Vitals Group     BP 02/15/21 0827 (!) 150/70     Pulse Rate 02/15/21 0827 79     Resp 02/15/21 0827 18     Temp 02/15/21 0827 98.4 F (36.9 C)     Temp Source 02/15/21 0827 Oral     SpO2 02/15/21 0827 100 %     Weight 02/15/21 0828 162 lb (73.5 kg)     Height 02/15/21 0828 5\' 11"  (1.803 m)     Head Circumference --      Peak Flow --      Pain Score 02/15/21 0826 8     Pain Loc --      Pain Edu? --      Excl. in [place]? --    No data found.  Updated Vital Signs BP (!) 150/70 (BP Location: Left Arm)   Pulse 79   Temp 98.4 F (36.9 C) (Oral)   Resp 18   Ht 5\' 11"  (1.803 m)   Wt 73.5 kg   SpO2 100%   BMI 22.59 kg/m   Visual Acuity Right Eye Distance:   Left Eye Distance:   Bilateral Distance:  Right Eye Near:   Left Eye Near:    Bilateral Near:     Physical Exam Vitals and nursing note reviewed.  Constitutional:      General: She is not in  acute distress.    Appearance: She is not ill-appearing, toxic-appearing or diaphoretic.  HENT:     Head: Normocephalic and atraumatic.     Mouth/Throat:     Mouth: Mucous membranes are moist.     Pharynx: Oropharynx is clear.  Eyes:     General: No visual field deficit or scleral icterus.    Extraocular Movements: Extraocular movements intact.     Pupils: Pupils are equal.  Cardiovascular:     Rate and Rhythm: Normal rate.     Heart sounds: Normal heart sounds. No murmur heard. No friction rub. No gallop.   Pulmonary:     Effort: Pulmonary effort is normal. No respiratory distress.     Breath sounds: Normal breath sounds. No stridor. No wheezing, rhonchi or rales.  Musculoskeletal:     Cervical back: Spasms and tenderness present. No swelling, edema, deformity, erythema, signs of trauma, lacerations, rigidity, torticollis, bony tenderness or crepitus. Pain with movement present. Decreased range of motion.     Comments: No tenderness to palpation over all the vertebrae facets.  She is tender to palpation in the soft tissues emanating from the base of the occiput down through the trapezius on the left side.  There is associated muscle spasm.  She has mild decreased range of motion of the cervical spine in all planes due to her discomfort.  Her strength in all planes is very well-preserved.  Spurling's test is negative.  Bilateral shoulder exam is also within normal limits with normal range of motion and normal strength.  Skin:    General: Skin is warm and dry.     Capillary Refill: Capillary refill takes less than 2 seconds.     Findings: No erythema or rash.  Neurological:     Mental Status: She is alert and oriented to person, place, and time.     GCS: GCS eye subscore is 4. GCS verbal subscore is 5. GCS motor subscore is 6.     Cranial Nerves: No cranial nerve deficit or dysarthria.     Sensory: No sensory deficit.     Motor: No weakness.     Gait: Gait normal.     Deep Tendon  Reflexes: Reflexes normal.  Psychiatric:        Mood and Affect: Mood normal.        Speech: Speech normal.        Behavior: Behavior normal.      UC Treatments / Results  Labs (all labs ordered are listed, but only abnormal results are displayed) Labs Reviewed - No data to display  EKG   Radiology No results found.  Procedures Procedures (including critical care time)  Medications Ordered in UC Medications - No data to display  Initial Impression / Assessment and Plan / UC Course  I have reviewed the triage vital signs and the nursing notes.  Pertinent labs & imaging results that were available during my care of the patient were reviewed by me and considered in my medical decision making (see chart for details).  Clinical impression: Left-sided neck pain with muscle spasm posteriorly.  She did have an associated headache that has resolved.  There is no neurological or cardiac issues on history or physical exam.  I suspect that this is cervical radiculopathy secondary to  degenerative disc disease.  Treatment plan: 1.  The findings and treatment plan were discussed in detail with the patient.  Patient was in agreement. 2.  Recommended a short course of prednisone.  Prescription was given to the patient to take to the pharmacy because her pharmacist was closed today. 3.  I think she would benefit greatly from a short course of physical therapy.  I have asked her to reach out to her primary care provider to see if they can give a prescription.  I cannot come from the urgent care due to insurance reasons. 4.  We discussed the muscle relaxer but given her age and medical conditions I did not feel that was a good idea today. 5.  While she is taking the steroids have asked her to take Tylenol only for discomfort.  She should stay away from NSAIDs.  This was reiterated and her primary care provider note from February when she had similar symptoms. 6.  Educational handouts  provided. 7.  I went over the red flag signs and symptoms and when to call 911 or go to the ER.  She voiced verbal understanding. 8.  Follow-up here as needed.  Greater than 30 minutes was spent in direct face-to-face contact with the patient here in the office today.    Final Clinical Impressions(s) / UC Diagnoses   Final diagnoses:  Neck pain  Left cervical radiculopathy  Muscle spasms of neck     Discharge Instructions     Left-sided neck pain with muscle spasm into the muscle surrounding her neck.  I suspect this is coming from some degeneration in your neck and potentially a pinched nerve that is causing you to have the symptoms. I prescribed a short course of steroids to help with your symptoms. You would benefit greatly from some physical therapy and home exercises.  I encourage you to contact your primary care provider so that you can get a referral for this.  This referral cannot come from the urgent care for insurance purposes. As we discussed you may benefit from a muscle relaxer but given your age and medical conditions I am not comfortable giving you this. With the steroids you cannot take any Motrin, Advil, ibuprofen, Naprosyn, Aleve, or aspirin products.  You can take Tylenol.  I have provided educational handouts.  Please use some moist heat to that area. If you develop worsening pain, vision changes, jaw pain, significant sweating, chest pain, or shortness of breath then please call 911 or go to the ER.  It was a pleasure meeting you today and I hope you get to feeling better. Dr. Drema Dallas    ED Prescriptions    Medication Sig Dispense Auth. Provider   predniSONE (DELTASONE) 5 MG tablet  (Status: Discontinued) Take 6 tablets for 2 days, then 4 tablets for 2 days, then 2 tablets for 2 days, then 1 tablet for 2 days until finished. 26 tablet Verda Cumins, MD   predniSONE (DELTASONE) 5 MG tablet Take 6 tablets for 2 days, then 4 tablets for 2 days, then 2 tablets for 2  days, then 1 tablet for 2 days until finished. 26 tablet Verda Cumins, MD     PDMP not reviewed this encounter.   Verda Cumins, MD 02/15/21 [id number]

## 2021-02-15 NOTE — Discharge Instructions (Signed)
Left-sided neck pain with muscle spasm into the muscle surrounding her neck.  I suspect this is coming from some degeneration in your neck and potentially a pinched nerve that is causing you to have the symptoms. I prescribed a short course of steroids to help with your symptoms. You would benefit greatly from some physical therapy and home exercises.  I encourage you to contact your primary care provider so that you can get a referral for this.  This referral cannot come from the urgent care for insurance purposes. As we discussed you may benefit from a muscle relaxer but given your age and medical conditions I am not comfortable giving you this. With the steroids you cannot take any Motrin, Advil, ibuprofen, Naprosyn, Aleve, or aspirin products.  You can take Tylenol.  I have provided educational handouts.  Please use some moist heat to that area. If you develop worsening pain, vision changes, jaw pain, significant sweating, chest pain, or shortness of breath then please call 911 or go to the ER.  It was a pleasure meeting you today and I hope you get to feeling better. Dr. Drema Dallas

## 2021-02-20 ENCOUNTER — Other Ambulatory Visit: Payer: Self-pay

## 2021-02-20 MED FILL — Pantoprazole Sodium EC Tab 40 MG (Base Equiv): ORAL | 30 days supply | Qty: 60 | Fill #0 | Status: AC

## 2021-02-26 ENCOUNTER — Other Ambulatory Visit: Payer: Self-pay

## 2021-02-26 DIAGNOSIS — M542 Cervicalgia: Secondary | ICD-10-CM | POA: Diagnosis not present

## 2021-02-26 MED ORDER — METHOCARBAMOL 500 MG PO TABS
ORAL_TABLET | ORAL | 0 refills | Status: DC
  Filled 2021-02-26: qty 20, 10d supply, fill #0

## 2021-03-04 DIAGNOSIS — M62830 Muscle spasm of back: Secondary | ICD-10-CM | POA: Diagnosis not present

## 2021-03-04 DIAGNOSIS — M6080 Other myositis, unspecified site: Secondary | ICD-10-CM | POA: Diagnosis not present

## 2021-03-04 DIAGNOSIS — M955 Acquired deformity of pelvis: Secondary | ICD-10-CM | POA: Diagnosis not present

## 2021-03-04 DIAGNOSIS — M461 Sacroiliitis, not elsewhere classified: Secondary | ICD-10-CM | POA: Diagnosis not present

## 2021-03-04 DIAGNOSIS — M546 Pain in thoracic spine: Secondary | ICD-10-CM | POA: Diagnosis not present

## 2021-03-04 DIAGNOSIS — M9902 Segmental and somatic dysfunction of thoracic region: Secondary | ICD-10-CM | POA: Diagnosis not present

## 2021-03-04 DIAGNOSIS — M5442 Lumbago with sciatica, left side: Secondary | ICD-10-CM | POA: Diagnosis not present

## 2021-03-04 DIAGNOSIS — M9905 Segmental and somatic dysfunction of pelvic region: Secondary | ICD-10-CM | POA: Diagnosis not present

## 2021-03-04 DIAGNOSIS — M9903 Segmental and somatic dysfunction of lumbar region: Secondary | ICD-10-CM | POA: Diagnosis not present

## 2021-03-05 DIAGNOSIS — M6080 Other myositis, unspecified site: Secondary | ICD-10-CM | POA: Diagnosis not present

## 2021-03-05 DIAGNOSIS — M955 Acquired deformity of pelvis: Secondary | ICD-10-CM | POA: Diagnosis not present

## 2021-03-05 DIAGNOSIS — M461 Sacroiliitis, not elsewhere classified: Secondary | ICD-10-CM | POA: Diagnosis not present

## 2021-03-05 DIAGNOSIS — M9902 Segmental and somatic dysfunction of thoracic region: Secondary | ICD-10-CM | POA: Diagnosis not present

## 2021-03-05 DIAGNOSIS — M546 Pain in thoracic spine: Secondary | ICD-10-CM | POA: Diagnosis not present

## 2021-03-05 DIAGNOSIS — M9905 Segmental and somatic dysfunction of pelvic region: Secondary | ICD-10-CM | POA: Diagnosis not present

## 2021-03-05 DIAGNOSIS — M9903 Segmental and somatic dysfunction of lumbar region: Secondary | ICD-10-CM | POA: Diagnosis not present

## 2021-03-05 DIAGNOSIS — M62830 Muscle spasm of back: Secondary | ICD-10-CM | POA: Diagnosis not present

## 2021-03-05 DIAGNOSIS — M5442 Lumbago with sciatica, left side: Secondary | ICD-10-CM | POA: Diagnosis not present

## 2021-03-11 DIAGNOSIS — M9903 Segmental and somatic dysfunction of lumbar region: Secondary | ICD-10-CM | POA: Diagnosis not present

## 2021-03-11 DIAGNOSIS — M5442 Lumbago with sciatica, left side: Secondary | ICD-10-CM | POA: Diagnosis not present

## 2021-03-11 DIAGNOSIS — M62830 Muscle spasm of back: Secondary | ICD-10-CM | POA: Diagnosis not present

## 2021-03-11 DIAGNOSIS — M955 Acquired deformity of pelvis: Secondary | ICD-10-CM | POA: Diagnosis not present

## 2021-03-11 DIAGNOSIS — M461 Sacroiliitis, not elsewhere classified: Secondary | ICD-10-CM | POA: Diagnosis not present

## 2021-03-11 DIAGNOSIS — M546 Pain in thoracic spine: Secondary | ICD-10-CM | POA: Diagnosis not present

## 2021-03-11 DIAGNOSIS — M6080 Other myositis, unspecified site: Secondary | ICD-10-CM | POA: Diagnosis not present

## 2021-03-11 DIAGNOSIS — M9902 Segmental and somatic dysfunction of thoracic region: Secondary | ICD-10-CM | POA: Diagnosis not present

## 2021-03-11 DIAGNOSIS — M9905 Segmental and somatic dysfunction of pelvic region: Secondary | ICD-10-CM | POA: Diagnosis not present

## 2021-03-19 ENCOUNTER — Other Ambulatory Visit: Payer: Self-pay

## 2021-03-19 MED ORDER — SITAGLIPTIN PHOSPHATE 100 MG PO TABS
ORAL_TABLET | ORAL | 5 refills | Status: AC
  Filled 2021-03-19: qty 30, 30d supply, fill #0
  Filled 2021-04-29: qty 30, 30d supply, fill #1
  Filled 2021-07-15: qty 30, 30d supply, fill #2
  Filled 2021-08-23 – 2021-08-25 (×2): qty 30, 30d supply, fill #3
  Filled 2021-10-13: qty 30, 30d supply, fill #4

## 2021-03-19 MED FILL — Losartan Potassium Tab 50 MG: ORAL | 90 days supply | Qty: 90 | Fill #0 | Status: AC

## 2021-03-20 ENCOUNTER — Other Ambulatory Visit: Payer: Self-pay

## 2021-03-20 DIAGNOSIS — E118 Type 2 diabetes mellitus with unspecified complications: Secondary | ICD-10-CM | POA: Diagnosis not present

## 2021-03-20 DIAGNOSIS — I10 Essential (primary) hypertension: Secondary | ICD-10-CM | POA: Diagnosis not present

## 2021-03-20 DIAGNOSIS — J449 Chronic obstructive pulmonary disease, unspecified: Secondary | ICD-10-CM | POA: Diagnosis not present

## 2021-03-20 DIAGNOSIS — Z0000 Encounter for general adult medical examination without abnormal findings: Secondary | ICD-10-CM | POA: Diagnosis not present

## 2021-03-20 DIAGNOSIS — E782 Mixed hyperlipidemia: Secondary | ICD-10-CM | POA: Diagnosis not present

## 2021-03-20 MED ORDER — AMLODIPINE BESYLATE 5 MG PO TABS
ORAL_TABLET | ORAL | 3 refills | Status: DC
  Filled 2021-03-20: qty 90, 90d supply, fill #0

## 2021-03-20 MED ORDER — JANUVIA 100 MG PO TABS
ORAL_TABLET | ORAL | 3 refills | Status: DC
  Filled 2021-03-20: qty 30, 30d supply, fill #0

## 2021-03-20 MED ORDER — LOSARTAN POTASSIUM 50 MG PO TABS
1.0000 | ORAL_TABLET | Freq: Every day | ORAL | 3 refills | Status: DC
  Filled 2021-03-20: qty 90, 90d supply, fill #0

## 2021-03-20 MED ORDER — PRAVASTATIN SODIUM 80 MG PO TABS
ORAL_TABLET | ORAL | 3 refills | Status: AC
  Filled 2021-03-20: qty 90, 90d supply, fill #0

## 2021-03-28 ENCOUNTER — Other Ambulatory Visit: Payer: Self-pay

## 2021-03-28 ENCOUNTER — Ambulatory Visit
Admission: RE | Admit: 2021-03-28 | Discharge: 2021-03-28 | Disposition: A | Discharge status: Home / Self Care | Payer: 59 | Source: Ambulatory Visit | Attending: Family Medicine | Admitting: Family Medicine

## 2021-03-28 DIAGNOSIS — Z1231 Encounter for screening mammogram for malignant neoplasm of breast: Secondary | ICD-10-CM | POA: Diagnosis not present

## 2021-04-07 ENCOUNTER — Ambulatory Visit (INDEPENDENT_AMBULATORY_CARE_PROVIDER_SITE_OTHER): Payer: 59 | Admitting: Urology

## 2021-04-07 ENCOUNTER — Encounter: Payer: Self-pay | Admitting: Urology

## 2021-04-07 ENCOUNTER — Other Ambulatory Visit: Payer: Self-pay

## 2021-04-07 VITALS — BP 165/73 | HR 79 | Ht 71.0 in | Wt 162.0 lb

## 2021-04-07 DIAGNOSIS — R32 Unspecified urinary incontinence: Secondary | ICD-10-CM

## 2021-04-07 DIAGNOSIS — N3941 Urge incontinence: Secondary | ICD-10-CM

## 2021-04-07 DIAGNOSIS — N3946 Mixed incontinence: Secondary | ICD-10-CM | POA: Diagnosis not present

## 2021-04-07 MED ORDER — MIRABEGRON ER 50 MG PO TB24: 50.0000 mg | ORAL_TABLET | Freq: Every day | ORAL | 11 refills | Status: AC

## 2021-04-07 NOTE — Progress Notes (Signed)
04/07/2021 3:16 PM   Nesa LAVANA HUCKEBA 12/01/1940 [id number]  Referring provider: Donnamarie Rossetti, PA-C [place],   [address]  Chief Complaint  Patient presents with  . Urinary Incontinence    HPI: Salted to assess the patient's urgency incontinence.  She wears 2 pads a day moderately wet.  I think she might leak with coughing of her bladder was full but otherwise does not.  No bedwetting.  She voids every 3 hours gets up 3 times a night  She is on oral hypoglycemics and has had a hysterectomy  No history of kidney stones bladder surgery or bladder infections.  No previous treatment   PMH: Past Medical History:  Diagnosis Date  . Acute diverticulitis 10/02/2016  . Anginal pain ([place])   . Anxiety   . Aortic atherosclerosis ([place])   . Arthritis   . Atherosclerotic heart disease   . Depression   . Diabetes mellitus without complication ([place])   . Diverticulosis of colon   . Dysrhythmia   . GERD (gastroesophageal reflux disease)   . Hiatal hernia   . History of heart artery stent   . History of shingles   . HOH (hard of hearing)   . Hyperlipidemia   . Hypertension   . Myocardial infarction ([place])   . Sarcoidosis   . Vitamin D deficiency     Surgical History: Past Surgical History:  Procedure Laterality Date  . ABDOMINAL HYSTERECTOMY  1979  . APPENDECTOMY    . CARDIAC SURGERY    . CATARACT EXTRACTION W/PHACO Right 10/28/2017   Procedure: CATARACT EXTRACTION PHACO AND INTRAOCULAR LENS PLACEMENT (IOC);  Surgeon: Eulogio Bear, MD;  Location: [facility] ORS;  Service: Ophthalmology;  Laterality: Right;  Lot # [id number] H [place]: 00:53.9 AP%: 10.6 CDE: 5.71  . CORONARY ANGIOPLASTY     STENT  . LAPAROTOMY N/A 10/04/2016   Procedure: EXPLORATORY LAPAROTOMY;  Surgeon: Dia Crawford III, MD;  Location: [facility] OR[facility];  Service: General;  Laterality: N/A;    Home Medications:  Allergies as of 04/07/2021      Reactions   Ace Inhibitors    Other  reaction(s): Cough   Codeine Swelling   Codeine Sulfate    Other reaction(s): Unknown   Ezetimibe    Other reaction(s): Muscle Pain   Other    Other reaction(s): Muscle Pain      Medication List       Accurate as of Apr 07, 2021  3:16 PM. If you have any questions, ask your nurse or doctor.        amLODipine 5 MG tablet Commonly known as: NORVASC Take 1 tablet (5 mg total) by mouth daily.   amLODipine 5 MG tablet Commonly known as: NORVASC TAKE 1 TABLET BY MOUTH ONCE DAILY   amLODipine 5 MG tablet Commonly known as: NORVASC Take 1 tablet (5 mg total) by mouth once daily   amoxicillin 875 MG tablet Commonly known as: AMOXIL TAKE 1 TABLET (875 MG TOTAL) BY MOUTH EVERY 12 (TWELVE) HOURS FOR 7 DAYS   aspirin EC 81 MG tablet Take 1 tablet (81 mg total) by mouth daily.   azithromycin 250 MG tablet Commonly known as: ZITHROMAX TAKE 2 TABLETS BY MOUTH ON DAY 1 THEN TAKE 1 TABLET DAILY FOR THE NEXT 4 DAYS   ciprofloxacin 500 MG tablet Commonly known as: CIPRO TAKE 1 TABLET (500 MG TOTAL) BY MOUTH 2 (TWO) TIMES DAILY FOR 10 DAYS   famotidine 20 MG tablet Commonly known as: Pepcid  Take 1 tablet (20 mg total) by mouth every morning.   freestyle lancets TEST 2 TIMES A DAY   FREESTYLE LITE test strip Generic drug: glucose blood USE AS INSTRUCTED   ipratropium 0.06 % nasal spray Commonly known as: ATROVENT Place 2 sprays into both nostrils 4 (four) times daily as needed for rhinitis.   latanoprost 0.005 % ophthalmic solution Commonly known as: XALATAN Place 1 drop into both eyes at bedtime.   latanoprost 0.005 % ophthalmic solution Commonly known as: XALATAN PLACE 1 DROP INTO BOTH EYES AT BEDTIME   losartan 50 MG tablet Commonly known as: COZAAR Take 1 tablet (50 mg total) by mouth daily.   losartan 50 MG tablet Commonly known as: COZAAR TAKE 1 TABLET (50 MG TOTAL) BY MOUTH ONCE DAILY   losartan 50 MG tablet Commonly known as: COZAAR Take 1 tablet (50 mg  total) by mouth once daily   methocarbamol 500 MG tablet Commonly known as: ROBAXIN Take 1 tablet (500 mg total) by mouth 2 (two) times daily as needed for up to 10 days   metroNIDAZOLE 500 MG tablet Commonly known as: FLAGYL TAKE 1 TABLET (500 MG TOTAL) BY MOUTH 3 (THREE) TIMES DAILY FOR 10 DAYS   multivitamin capsule Take 1 capsule by mouth daily.   Nitrostat 0.4 MG SL tablet Generic drug: nitroGLYCERIN DISSOLVE ONE TABLET UNDER TONGUE AS NEED FOR CHEST PAIN AS DIRECTED   omeprazole 20 MG capsule Commonly known as: PRILOSEC One twice a day   pantoprazole 40 MG tablet Commonly known as: PROTONIX TAKE 1 TABLET BY MOUTH 2 TIMES DAILY BEFORE MEALS   pravastatin 40 MG tablet Commonly known as: PRAVACHOL Take 1 tablet (40 mg total) by mouth daily.   pravastatin 80 MG tablet Commonly known as: PRAVACHOL Take 1 tablet (80 mg total) by mouth nightly   predniSONE 5 MG tablet Commonly known as: DELTASONE Take 6 tablets for 2 days, then 4 tablets for 2 days, then 2 tablets for 2 days, then 1 tablet for 2 days until finished.   sitaGLIPtin 100 MG tablet Commonly known as: Januvia TAKE 1 TABLET (100 MG TOTAL) BY MOUTH DAILY.   Januvia 100 MG tablet Generic drug: sitaGLIPtin TAKE 1 TABLET (100 MG TOTAL) BY MOUTH ONCE DAILY   Januvia 100 MG tablet Generic drug: sitaGLIPtin Take 1 tablet (100 mg total) by mouth once daily       Allergies:  Allergies  Allergen Reactions  . Ace Inhibitors     Other reaction(s): Cough  . Codeine Swelling  . Codeine Sulfate     Other reaction(s): Unknown  . Ezetimibe     Other reaction(s): Muscle Pain  . Other     Other reaction(s): Muscle Pain    Family History: Family History  Problem Relation Age of Onset  . Diabetes Daughter   . Hypertension Mother   . Other Father        MVA  . Diabetes Son   . Breast cancer Neg Hx     Social History:  reports that she quit smoking about 32 years ago. Her smoking use included cigarettes.  She has a 20.00 pack-year smoking history. She has never used smokeless tobacco. She reports that she does not drink alcohol and does not use drugs.  ROS:  Physical Exam: There were no vitals taken for this visit.  Constitutional:  Alert and oriented, No acute distress. HEENT: [place] AT, moist mucus membranes.  Trachea midline, no masses. Cardiovascular: No clubbing, cyanosis, or edema. Respiratory: Normal respiratory effort, no increased work of breathing. GI: Abdomen is soft, nontender, nondistended, no abdominal masses GU: No CVA tenderness.  Patient wanted the pelvic semination deferred.  She works here and says she did not showered or cleaned up Skin: No rashes, bruises or suspicious lesions. Lymph: No cervical or inguinal adenopathy. Neurologic: Grossly intact, no focal deficits, moving all 4 extremities. Psychiatric: Normal mood and affect.  Laboratory Data: Lab Results  Component Value Date   WBC 6.0 01/23/2020   HGB 12.1 01/23/2020   HCT 36.7 01/23/2020   MCV 93.1 01/23/2020   PLT 200 01/23/2020    Lab Results  Component Value Date   CREATININE 0.96 01/23/2020    No results found for: PSA  No results found for: TESTOSTERONE  Lab Results  Component Value Date   HGBA1C 6.7 (H) 12/23/2018    Urinalysis    Component Value Date/Time   COLORURINE YELLOW (A) 10/02/2016 1148   APPEARANCEUR CLEAR (A) 10/02/2016 1148   APPEARANCEUR Clear 01/21/2012 1557   LABSPEC 1.024 10/02/2016 1148   LABSPEC 1.029 01/21/2012 1557   PHURINE 5.0 10/02/2016 1148   GLUCOSEU 50 (A) 10/02/2016 1148   GLUCOSEU Negative 01/21/2012 1557   HGBUR NEGATIVE 10/02/2016 1148   BILIRUBINUR negative 11/24/2018 0929   BILIRUBINUR Negative 01/21/2012 1557   KETONESUR TRACE (A) 10/02/2016 1148   PROTEINUR Negative 11/24/2018 0929   PROTEINUR 30 (A) 10/02/2016 1148   UROBILINOGEN 0.2 11/24/2018 0929   NITRITE negative 11/24/2018 0929    NITRITE NEGATIVE 10/02/2016 1148   LEUKOCYTESUR Negative 11/24/2018 0929   LEUKOCYTESUR Negative 01/21/2012 1557    Pertinent Imaging: Urine reviewed.  Urine sent for culture.  Chart reviewed  Assessment & Plan: Patient primarily has urge incontinence.  She has mild frequency and nocturia.  Reassess in 6 weeks on Myrbetriq 50 mg samples and prescription the new beta 3 agonist would be a good treatment option for her.   1. Urinary incontinence, unspecified type  - Urinalysis, Complete   No follow-ups on file.  Reece Packer, MD  [facility] [address] [place], [place] [address] [phone]

## 2021-04-08 LAB — URINALYSIS, COMPLETE
Bilirubin, UA: NEGATIVE
Glucose, UA: NEGATIVE
Ketones, UA: NEGATIVE
Nitrite, UA: NEGATIVE
Protein,UA: NEGATIVE
RBC, UA: NEGATIVE
Specific Gravity, UA: 1.025 (ref 1.005–1.030)
Urobilinogen, Ur: 0.2 mg/dL (ref 0.2–1.0)
pH, UA: 5.5 (ref 5.0–7.5)

## 2021-04-08 LAB — MICROSCOPIC EXAMINATION
Bacteria, UA: NONE SEEN
RBC, Urine: NONE SEEN /hpf (ref 0–2)

## 2021-04-10 DIAGNOSIS — B351 Tinea unguium: Secondary | ICD-10-CM | POA: Diagnosis not present

## 2021-04-10 DIAGNOSIS — M79672 Pain in left foot: Secondary | ICD-10-CM | POA: Diagnosis not present

## 2021-04-10 DIAGNOSIS — M79671 Pain in right foot: Secondary | ICD-10-CM | POA: Diagnosis not present

## 2021-04-10 LAB — CULTURE, URINE COMPREHENSIVE

## 2021-04-15 DIAGNOSIS — M9902 Segmental and somatic dysfunction of thoracic region: Secondary | ICD-10-CM | POA: Diagnosis not present

## 2021-04-15 DIAGNOSIS — M6080 Other myositis, unspecified site: Secondary | ICD-10-CM | POA: Diagnosis not present

## 2021-04-15 DIAGNOSIS — M955 Acquired deformity of pelvis: Secondary | ICD-10-CM | POA: Diagnosis not present

## 2021-04-15 DIAGNOSIS — M546 Pain in thoracic spine: Secondary | ICD-10-CM | POA: Diagnosis not present

## 2021-04-15 DIAGNOSIS — M5442 Lumbago with sciatica, left side: Secondary | ICD-10-CM | POA: Diagnosis not present

## 2021-04-15 DIAGNOSIS — M461 Sacroiliitis, not elsewhere classified: Secondary | ICD-10-CM | POA: Diagnosis not present

## 2021-04-15 DIAGNOSIS — M9903 Segmental and somatic dysfunction of lumbar region: Secondary | ICD-10-CM | POA: Diagnosis not present

## 2021-04-15 DIAGNOSIS — M9905 Segmental and somatic dysfunction of pelvic region: Secondary | ICD-10-CM | POA: Diagnosis not present

## 2021-04-15 DIAGNOSIS — M62830 Muscle spasm of back: Secondary | ICD-10-CM | POA: Diagnosis not present

## 2021-04-22 ENCOUNTER — Other Ambulatory Visit: Payer: Self-pay

## 2021-04-22 DIAGNOSIS — Z862 Personal history of diseases of the blood and blood-forming organs and certain disorders involving the immune mechanism: Secondary | ICD-10-CM | POA: Diagnosis not present

## 2021-04-22 DIAGNOSIS — R0781 Pleurodynia: Secondary | ICD-10-CM | POA: Diagnosis not present

## 2021-04-22 DIAGNOSIS — R0602 Shortness of breath: Secondary | ICD-10-CM | POA: Diagnosis not present

## 2021-04-22 DIAGNOSIS — E118 Type 2 diabetes mellitus with unspecified complications: Secondary | ICD-10-CM | POA: Diagnosis not present

## 2021-04-22 MED ORDER — MELOXICAM 15 MG PO TABS
ORAL_TABLET | ORAL | 0 refills | Status: DC
  Filled 2021-04-22: qty 10, 10d supply, fill #0

## 2021-04-29 ENCOUNTER — Other Ambulatory Visit: Payer: Self-pay

## 2021-05-01 ENCOUNTER — Other Ambulatory Visit: Payer: Self-pay

## 2021-05-01 DIAGNOSIS — H401132 Primary open-angle glaucoma, bilateral, moderate stage: Secondary | ICD-10-CM | POA: Diagnosis not present

## 2021-05-01 MED FILL — Latanoprost Ophth Soln 0.005%: OPHTHALMIC | 50 days supply | Qty: 5 | Fill #0 | Status: AC

## 2021-05-13 DIAGNOSIS — M546 Pain in thoracic spine: Secondary | ICD-10-CM | POA: Diagnosis not present

## 2021-05-13 DIAGNOSIS — M9905 Segmental and somatic dysfunction of pelvic region: Secondary | ICD-10-CM | POA: Diagnosis not present

## 2021-05-13 DIAGNOSIS — M9902 Segmental and somatic dysfunction of thoracic region: Secondary | ICD-10-CM | POA: Diagnosis not present

## 2021-05-13 DIAGNOSIS — M5442 Lumbago with sciatica, left side: Secondary | ICD-10-CM | POA: Diagnosis not present

## 2021-05-13 DIAGNOSIS — M955 Acquired deformity of pelvis: Secondary | ICD-10-CM | POA: Diagnosis not present

## 2021-05-13 DIAGNOSIS — M6080 Other myositis, unspecified site: Secondary | ICD-10-CM | POA: Diagnosis not present

## 2021-05-13 DIAGNOSIS — M9903 Segmental and somatic dysfunction of lumbar region: Secondary | ICD-10-CM | POA: Diagnosis not present

## 2021-05-13 DIAGNOSIS — M62830 Muscle spasm of back: Secondary | ICD-10-CM | POA: Diagnosis not present

## 2021-05-13 DIAGNOSIS — M461 Sacroiliitis, not elsewhere classified: Secondary | ICD-10-CM | POA: Diagnosis not present

## 2021-05-14 ENCOUNTER — Other Ambulatory Visit: Payer: Self-pay

## 2021-05-14 MED FILL — Pantoprazole Sodium EC Tab 40 MG (Base Equiv): ORAL | 30 days supply | Qty: 60 | Fill #1 | Status: CN

## 2021-05-17 ENCOUNTER — Other Ambulatory Visit: Payer: Self-pay

## 2021-05-17 ENCOUNTER — Ambulatory Visit (INDEPENDENT_AMBULATORY_CARE_PROVIDER_SITE_OTHER)
Admission: EM | Admit: 2021-05-17 | Discharge: 2021-05-17 | Disposition: A | Discharge status: Home / Self Care | Payer: 59 | Source: Home / Self Care | Attending: Family Medicine | Admitting: Family Medicine

## 2021-05-17 DIAGNOSIS — E785 Hyperlipidemia, unspecified: Secondary | ICD-10-CM | POA: Diagnosis not present

## 2021-05-17 DIAGNOSIS — Z8249 Family history of ischemic heart disease and other diseases of the circulatory system: Secondary | ICD-10-CM | POA: Diagnosis not present

## 2021-05-17 DIAGNOSIS — I878 Other specified disorders of veins: Secondary | ICD-10-CM | POA: Diagnosis not present

## 2021-05-17 DIAGNOSIS — I2510 Atherosclerotic heart disease of native coronary artery without angina pectoris: Secondary | ICD-10-CM | POA: Diagnosis not present

## 2021-05-17 DIAGNOSIS — Z7984 Long term (current) use of oral hypoglycemic drugs: Secondary | ICD-10-CM | POA: Insufficient documentation

## 2021-05-17 DIAGNOSIS — Z791 Long term (current) use of non-steroidal anti-inflammatories (NSAID): Secondary | ICD-10-CM | POA: Diagnosis not present

## 2021-05-17 DIAGNOSIS — R110 Nausea: Secondary | ICD-10-CM | POA: Insufficient documentation

## 2021-05-17 DIAGNOSIS — J449 Chronic obstructive pulmonary disease, unspecified: Secondary | ICD-10-CM | POA: Diagnosis not present

## 2021-05-17 DIAGNOSIS — I517 Cardiomegaly: Secondary | ICD-10-CM | POA: Diagnosis not present

## 2021-05-17 DIAGNOSIS — K219 Gastro-esophageal reflux disease without esophagitis: Secondary | ICD-10-CM | POA: Diagnosis not present

## 2021-05-17 DIAGNOSIS — F419 Anxiety disorder, unspecified: Secondary | ICD-10-CM | POA: Diagnosis not present

## 2021-05-17 DIAGNOSIS — Z79899 Other long term (current) drug therapy: Secondary | ICD-10-CM | POA: Diagnosis not present

## 2021-05-17 DIAGNOSIS — G8929 Other chronic pain: Secondary | ICD-10-CM | POA: Insufficient documentation

## 2021-05-17 DIAGNOSIS — S72001S Fracture of unspecified part of neck of right femur, sequela: Secondary | ICD-10-CM | POA: Diagnosis not present

## 2021-05-17 DIAGNOSIS — S72001A Fracture of unspecified part of neck of right femur, initial encounter for closed fracture: Secondary | ICD-10-CM | POA: Diagnosis not present

## 2021-05-17 DIAGNOSIS — I6782 Cerebral ischemia: Secondary | ICD-10-CM | POA: Diagnosis not present

## 2021-05-17 DIAGNOSIS — Z96641 Presence of right artificial hip joint: Secondary | ICD-10-CM | POA: Diagnosis not present

## 2021-05-17 DIAGNOSIS — M47816 Spondylosis without myelopathy or radiculopathy, lumbar region: Secondary | ICD-10-CM | POA: Diagnosis not present

## 2021-05-17 DIAGNOSIS — Z471 Aftercare following joint replacement surgery: Secondary | ICD-10-CM | POA: Diagnosis not present

## 2021-05-17 DIAGNOSIS — F32A Depression, unspecified: Secondary | ICD-10-CM | POA: Diagnosis not present

## 2021-05-17 DIAGNOSIS — R42 Dizziness and giddiness: Secondary | ICD-10-CM

## 2021-05-17 DIAGNOSIS — I10 Essential (primary) hypertension: Secondary | ICD-10-CM | POA: Diagnosis not present

## 2021-05-17 DIAGNOSIS — S72001D Fracture of unspecified part of neck of right femur, subsequent encounter for closed fracture with routine healing: Secondary | ICD-10-CM | POA: Diagnosis not present

## 2021-05-17 DIAGNOSIS — Z87891 Personal history of nicotine dependence: Secondary | ICD-10-CM | POA: Insufficient documentation

## 2021-05-17 DIAGNOSIS — K5900 Constipation, unspecified: Secondary | ICD-10-CM | POA: Diagnosis present

## 2021-05-17 DIAGNOSIS — Y92009 Unspecified place in unspecified non-institutional (private) residence as the place of occurrence of the external cause: Secondary | ICD-10-CM | POA: Diagnosis not present

## 2021-05-17 DIAGNOSIS — E1122 Type 2 diabetes mellitus with diabetic chronic kidney disease: Secondary | ICD-10-CM | POA: Diagnosis not present

## 2021-05-17 DIAGNOSIS — Z955 Presence of coronary angioplasty implant and graft: Secondary | ICD-10-CM | POA: Insufficient documentation

## 2021-05-17 DIAGNOSIS — N1831 Chronic kidney disease, stage 3a: Secondary | ICD-10-CM | POA: Diagnosis not present

## 2021-05-17 DIAGNOSIS — Z885 Allergy status to narcotic agent status: Secondary | ICD-10-CM | POA: Insufficient documentation

## 2021-05-17 DIAGNOSIS — J9811 Atelectasis: Secondary | ICD-10-CM | POA: Diagnosis not present

## 2021-05-17 DIAGNOSIS — Z961 Presence of intraocular lens: Secondary | ICD-10-CM | POA: Diagnosis present

## 2021-05-17 DIAGNOSIS — I700 Atherosclerosis of aorta: Secondary | ICD-10-CM | POA: Diagnosis not present

## 2021-05-17 DIAGNOSIS — Z20822 Contact with and (suspected) exposure to covid-19: Secondary | ICD-10-CM | POA: Insufficient documentation

## 2021-05-17 DIAGNOSIS — Z7982 Long term (current) use of aspirin: Secondary | ICD-10-CM | POA: Diagnosis not present

## 2021-05-17 DIAGNOSIS — S0990XA Unspecified injury of head, initial encounter: Secondary | ICD-10-CM | POA: Diagnosis not present

## 2021-05-17 DIAGNOSIS — S72041A Displaced fracture of base of neck of right femur, initial encounter for closed fracture: Secondary | ICD-10-CM | POA: Diagnosis not present

## 2021-05-17 DIAGNOSIS — M1611 Unilateral primary osteoarthritis, right hip: Secondary | ICD-10-CM | POA: Diagnosis not present

## 2021-05-17 DIAGNOSIS — Z9841 Cataract extraction status, right eye: Secondary | ICD-10-CM | POA: Diagnosis not present

## 2021-05-17 DIAGNOSIS — I252 Old myocardial infarction: Secondary | ICD-10-CM | POA: Diagnosis not present

## 2021-05-17 DIAGNOSIS — M542 Cervicalgia: Secondary | ICD-10-CM | POA: Insufficient documentation

## 2021-05-17 DIAGNOSIS — I1310 Hypertensive heart and chronic kidney disease without heart failure, with stage 1 through stage 4 chronic kidney disease, or unspecified chronic kidney disease: Secondary | ICD-10-CM | POA: Diagnosis not present

## 2021-05-17 DIAGNOSIS — W19XXXA Unspecified fall, initial encounter: Secondary | ICD-10-CM | POA: Diagnosis not present

## 2021-05-17 DIAGNOSIS — R52 Pain, unspecified: Secondary | ICD-10-CM | POA: Diagnosis not present

## 2021-05-17 DIAGNOSIS — G319 Degenerative disease of nervous system, unspecified: Secondary | ICD-10-CM | POA: Diagnosis not present

## 2021-05-17 DIAGNOSIS — Z833 Family history of diabetes mellitus: Secondary | ICD-10-CM | POA: Diagnosis not present

## 2021-05-17 DIAGNOSIS — W109XXA Fall (on) (from) unspecified stairs and steps, initial encounter: Secondary | ICD-10-CM | POA: Diagnosis present

## 2021-05-17 DIAGNOSIS — Z888 Allergy status to other drugs, medicaments and biological substances status: Secondary | ICD-10-CM | POA: Diagnosis not present

## 2021-05-17 MED ORDER — ONDANSETRON 4 MG PO TBDP: 4.0000 mg | ORAL_TABLET | Freq: Three times a day (TID) | ORAL | 0 refills | Status: DC | PRN

## 2021-05-17 NOTE — Discharge Instructions (Addendum)
Medication as needed for nausea. It will help dizziness as well.  COVID test result will be back in the next 1-2 days.  Take care  Dr. Lacinda Axon

## 2021-05-17 NOTE — ED Triage Notes (Signed)
Pt c/o neck pain for several months and dizziness since yesterday. Pt denies headache, nasal congestion, cough, f/n/v/d or other symptoms.

## 2021-05-17 NOTE — ED Provider Notes (Signed)
[facility]    CSN: [id number] Arrival date & time: 05/17/21  1138      History   Chief Complaint Chief Complaint  Patient presents with   Dizziness    HPI  80 year old female presents with dizziness.  Patient reports that she developed dizziness yesterday.  This has resolved.  She states that she was eating breakfast this morning and had nausea.  This has resolved.  She states that her stomach feels "queasy".  Has no respiratory symptoms.  Is concerned about the possibility of COVID-19.  Desires testing today.  Patient reports ongoing neck pain.  This is a chronic problem.  No other associated symptoms.  No other complaints.   Past Medical History:  Diagnosis Date   Acute diverticulitis 10/02/2016   Anginal pain ([place])    Anxiety    Aortic atherosclerosis ([place])    Arthritis    Atherosclerotic heart disease    Depression    Diabetes mellitus without complication ([place])    Diverticulosis of colon    Dysrhythmia    GERD (gastroesophageal reflux disease)    Hiatal hernia    History of heart artery stent    History of shingles    HOH (hard of hearing)    Hyperlipidemia    Hypertension    Myocardial infarction [facility])    Sarcoidosis    Vitamin D deficiency     Patient Active Problem List   Diagnosis Date Noted   Status post exploratory laparotomy 10/21/2016   Myocardial infarction ([place]) 10/20/2016   SBO (small bowel obstruction) (H[facility])    Aortic atherosclerosis ([place]) 10/02/2016   Diverticulosis of large intestine without hemorrhage 10/02/2016   Hammer toe of right foot 10/16/2015   Nail deformity 10/16/2015   Chronic obstructive pulmonary disease ([place]) 09/04/2015   History of MI (myocardial infarction) 09/04/2015   Spondylolisthesis at L4-L5 level 07/11/2015   DDD (degenerative disc disease), lumbar 07/11/2015   History of sarcoidosis 06/04/2015   Urge incontinence of urine 06/04/2015   Hearing loss of both ears 05/07/2015   Anxiety and depression  05/04/2015   Arteriosclerosis of coronary artery 05/04/2015   Type 2 diabetes mellitus with microalbuminuria, without long-term current use of insulin ([place]) 05/04/2015   Dyslipidemia 05/04/2015   Hypertension, benign 05/04/2015   Gastro-esophageal reflux disease without esophagitis 05/04/2015   Hiatal hernia 05/04/2015   HZV (herpes zoster virus) post herpetic neuralgia 05/04/2015   Vitamin D deficiency 05/04/2015   S/P coronary artery stent placement 05/04/2015    Past Surgical History:  Procedure Laterality Date   ABDOMINAL HYSTERECTOMY  1979   APPENDECTOMY     CARDIAC SURGERY     CATARACT EXTRACTION W/PHACO Right 10/28/2017   Procedure: CATARACT EXTRACTION PHACO AND INTRAOCULAR LENS PLACEMENT ([place]);  Surgeon: Eulogio Bear, MD;  Location: [facility] ORS;  Service: Ophthalmology;  Laterality: Right;  Lot # [id number] H [place]: 00:53.9 AP%: 10.6 CDE: 5.71   CORONARY ANGIOPLASTY     STENT   LAPAROTOMY N/A 10/04/2016   Procedure: EXPLORATORY LAPAROTOMY;  Surgeon: Dia Crawford III, MD;  Location: [facility] ORS;  Service: General;  Laterality: N/A;    OB History   No obstetric history on file.      Home Medications    Prior to Admission medications   Medication Sig Start Date End Date Taking? Authorizing Provider  amLODipine (NORVASC) 5 MG tablet Take 1 tablet (5 mg total) by mouth daily. 12/23/18  Yes Juline Patch, MD  aspirin EC 81 MG tablet Take 1 tablet (  81 mg total) by mouth daily. 12/29/16  Yes Otilio Miu C, MD  glucose blood (FREESTYLE LITE) test strip USE AS INSTRUCTED 11/13/19  Yes Hubbard Hartshorn, FNP  ipratropium (ATROVENT) 0.06 % nasal spray Place 2 sprays into both nostrils 4 (four) times daily as needed for rhinitis. 07/06/20  Yes Breanne Olvera G, DO  Lancets (FREESTYLE) lancets TEST 2 TIMES A DAY 12/23/20 12/23/21 Yes Whitaker, Jason Hestle, PA-C  latanoprost (XALATAN) 0.005 % ophthalmic solution PLACE 1 DROP INTO BOTH EYES AT BEDTIME 08/26/20 08/26/21 Yes Eulogio Bear, MD   losartan (COZAAR) 50 MG tablet Take 1 tablet (50 mg total) by mouth daily. 12/23/18  Yes Juline Patch, MD  meloxicam (MOBIC) 15 MG tablet Take 1 tablet (15 mg total) by mouth once daily for 10 days 04/22/21  Yes   methocarbamol (ROBAXIN) 500 MG tablet Take 1 tablet (500 mg total) by mouth 2 (two) times daily as needed for up to 10 days 02/26/21  Yes   mirabegron ER (MYRBETRIQ) 50 MG TB24 tablet Take 1 tablet (50 mg total) by mouth daily. 04/07/21  Yes MacDiarmid, Nicki Reaper, MD  Multiple Vitamin (MULTIVITAMIN) capsule Take 1 capsule by mouth daily.   Yes [provider]  NITROSTAT 0.4 MG SL tablet DISSOLVE ONE TABLET UNDER TONGUE AS NEED FOR CHEST PAIN AS DIRECTED 11/28/15  Yes Sowles, Drue Stager, MD  ondansetron (ZOFRAN ODT) 4 MG disintegrating tablet Take 1 tablet (4 mg total) by mouth every 8 (eight) hours as needed for nausea or vomiting. 05/17/21  Yes Hazem Kenner G, DO  pantoprazole (PROTONIX) 40 MG tablet TAKE 1 TABLET BY MOUTH 2 TIMES DAILY BEFORE MEALS 10/31/20 10/31/21 Yes Whitaker, Jason Hestle, PA-C  pravastatin (PRAVACHOL) 80 MG tablet Take 1 tablet (80 mg total) by mouth nightly 03/20/21  Yes   sitaGLIPtin (JANUVIA) 100 MG tablet TAKE 1 TABLET (100 MG TOTAL) BY MOUTH DAILY. 07/10/19  Yes Hubbard Hartshorn, FNP  sitaGLIPtin (JANUVIA) 100 MG tablet TAKE 1 TABLET (100 MG TOTAL) BY MOUTH ONCE DAILY 03/19/21 03/19/22 Yes     Family History Family History  Problem Relation Age of Onset   Diabetes Daughter    Hypertension Mother    Other Father        MVA   Diabetes Son    Breast cancer Neg Hx     Social History Social History   Tobacco Use   Smoking status: Former    Packs/day: 2.00    Years: 10.00    Pack years: 20.00    Types: Cigarettes    Quit date: 11/16/1988    Years since quitting: 32.5   Smokeless tobacco: Never  Vaping Use   Vaping Use: Never used  Substance Use Topics   Alcohol use: No    Alcohol/week: 0.0 standard drinks   Drug use: No     Allergies   Ace inhibitors,  Codeine, Codeine sulfate, Ezetimibe, and Other   Review of Systems Review of Systems  Gastrointestinal:  Positive for nausea.  Neurological:  Positive for dizziness.   Physical Exam Triage Vital Signs ED Triage Vitals  Enc Vitals Group     BP 05/17/21 1212 (!) 147/67     Pulse Rate 05/17/21 1212 76     Resp 05/17/21 1212 18     Temp 05/17/21 1212 98.3 F (36.8 C)     Temp Source 05/17/21 1212 Oral     SpO2 05/17/21 1212 100 %     Weight 05/17/21 1209 163 lb (73.9 kg)  Height 05/17/21 1209 5\' 11"  (1.803 m)     Head Circumference --      Peak Flow --      Pain Score 05/17/21 1209 8     Pain Loc --      Pain Edu? --      Excl. in [place]? --    Updated Vital Signs BP (!) 147/67 (BP Location: Left Arm)   Pulse 76   Temp 98.3 F (36.8 C) (Oral)   Resp 18   Ht 5\' 11"  (1.803 m)   Wt 73.9 kg   SpO2 100%   BMI 22.73 kg/m   Visual Acuity Right Eye Distance:   Left Eye Distance:   Bilateral Distance:    Right Eye Near:   Left Eye Near:    Bilateral Near:     Physical Exam Vitals and nursing note reviewed.  Constitutional:      General: She is not in acute distress.    Appearance: Normal appearance. She is not ill-appearing.  HENT:     Head: Normocephalic and atraumatic.  Eyes:     General:        Right eye: No discharge.        Left eye: No discharge.     Conjunctiva/sclera: Conjunctivae normal.  Cardiovascular:     Rate and Rhythm: Normal rate and regular rhythm.  Pulmonary:     Effort: Pulmonary effort is normal.     Breath sounds: Normal breath sounds. No wheezing or rales.  Neurological:     General: No focal deficit present.     Mental Status: She is alert.  Psychiatric:        Mood and Affect: Mood normal.        Behavior: Behavior normal.     UC Treatments / Results  Labs (all labs ordered are listed, but only abnormal results are displayed) Labs Reviewed  SARS CORONAVIRUS 2 (TAT 6-24 HRS)    EKG   Radiology No results  found.  Procedures Procedures (including critical care time)  Medications Ordered in UC Medications - No data to display  Initial Impression / Assessment and Plan / UC Course  I have reviewed the triage vital signs and the nursing notes.  Pertinent labs & imaging results that were available during my care of the patient were reviewed by me and considered in my medical decision making (see chart for details).    80 year old female presents with recent dizziness and nausea.  She is currently feeling well.  Nausea has resolved.  Dizziness has resolved.  She is concerned about the possibility of COVID-19 and desires testing today.  Zofran as needed.  Final Clinical Impressions(s) / UC Diagnoses   Final diagnoses:  Nausea without vomiting  Dizziness     Discharge Instructions      Medication as needed for nausea. It will help dizziness as well.  COVID test result will be back in the next 1-2 days.  Take care  Dr. Lacinda Axon    ED Prescriptions     Medication Sig Dispense Auth. Provider   ondansetron (ZOFRAN ODT) 4 MG disintegrating tablet Take 1 tablet (4 mg total) by mouth every 8 (eight) hours as needed for nausea or vomiting. 20 tablet Coral Spikes, DO      PDMP not reviewed this encounter.   Coral Spikes, [place] 05/17/21 1404

## 2021-05-18 LAB — SARS CORONAVIRUS 2 (TAT 6-24 HRS): SARS Coronavirus 2: NEGATIVE

## 2021-05-20 ENCOUNTER — Inpatient Hospital Stay
Admission: EM | Admit: 2021-05-20 | Discharge: 2021-05-29 | DRG: 522 | Disposition: A | Discharge status: Skilled Nursing Facility | Payer: 59 | Attending: Internal Medicine | Admitting: Internal Medicine

## 2021-05-20 ENCOUNTER — Emergency Department: Payer: 59

## 2021-05-20 ENCOUNTER — Other Ambulatory Visit: Payer: Self-pay

## 2021-05-20 ENCOUNTER — Inpatient Hospital Stay: Payer: 59

## 2021-05-20 DIAGNOSIS — S0990XA Unspecified injury of head, initial encounter: Secondary | ICD-10-CM | POA: Diagnosis not present

## 2021-05-20 DIAGNOSIS — I252 Old myocardial infarction: Secondary | ICD-10-CM

## 2021-05-20 DIAGNOSIS — S72001D Fracture of unspecified part of neck of right femur, subsequent encounter for closed fracture with routine healing: Secondary | ICD-10-CM | POA: Diagnosis not present

## 2021-05-20 DIAGNOSIS — Z20822 Contact with and (suspected) exposure to covid-19: Secondary | ICD-10-CM | POA: Diagnosis present

## 2021-05-20 DIAGNOSIS — Z8249 Family history of ischemic heart disease and other diseases of the circulatory system: Secondary | ICD-10-CM | POA: Diagnosis not present

## 2021-05-20 DIAGNOSIS — S72001S Fracture of unspecified part of neck of right femur, sequela: Secondary | ICD-10-CM | POA: Diagnosis not present

## 2021-05-20 DIAGNOSIS — Z833 Family history of diabetes mellitus: Secondary | ICD-10-CM | POA: Diagnosis not present

## 2021-05-20 DIAGNOSIS — W19XXXA Unspecified fall, initial encounter: Secondary | ICD-10-CM

## 2021-05-20 DIAGNOSIS — K219 Gastro-esophageal reflux disease without esophagitis: Secondary | ICD-10-CM | POA: Diagnosis present

## 2021-05-20 DIAGNOSIS — I517 Cardiomegaly: Secondary | ICD-10-CM | POA: Diagnosis not present

## 2021-05-20 DIAGNOSIS — I6782 Cerebral ischemia: Secondary | ICD-10-CM | POA: Diagnosis not present

## 2021-05-20 DIAGNOSIS — R52 Pain, unspecified: Secondary | ICD-10-CM | POA: Diagnosis not present

## 2021-05-20 DIAGNOSIS — Z87891 Personal history of nicotine dependence: Secondary | ICD-10-CM | POA: Diagnosis not present

## 2021-05-20 DIAGNOSIS — Z9841 Cataract extraction status, right eye: Secondary | ICD-10-CM

## 2021-05-20 DIAGNOSIS — I700 Atherosclerosis of aorta: Secondary | ICD-10-CM | POA: Diagnosis present

## 2021-05-20 DIAGNOSIS — Y92009 Unspecified place in unspecified non-institutional (private) residence as the place of occurrence of the external cause: Secondary | ICD-10-CM

## 2021-05-20 DIAGNOSIS — Z888 Allergy status to other drugs, medicaments and biological substances status: Secondary | ICD-10-CM

## 2021-05-20 DIAGNOSIS — W109XXA Fall (on) (from) unspecified stairs and steps, initial encounter: Secondary | ICD-10-CM | POA: Diagnosis present

## 2021-05-20 DIAGNOSIS — S72041A Displaced fracture of base of neck of right femur, initial encounter for closed fracture: Secondary | ICD-10-CM | POA: Diagnosis not present

## 2021-05-20 DIAGNOSIS — Z96641 Presence of right artificial hip joint: Secondary | ICD-10-CM | POA: Diagnosis not present

## 2021-05-20 DIAGNOSIS — E1122 Type 2 diabetes mellitus with diabetic chronic kidney disease: Secondary | ICD-10-CM | POA: Diagnosis present

## 2021-05-20 DIAGNOSIS — I878 Other specified disorders of veins: Secondary | ICD-10-CM | POA: Diagnosis not present

## 2021-05-20 DIAGNOSIS — Z791 Long term (current) use of non-steroidal anti-inflammatories (NSAID): Secondary | ICD-10-CM

## 2021-05-20 DIAGNOSIS — Z79899 Other long term (current) drug therapy: Secondary | ICD-10-CM | POA: Diagnosis not present

## 2021-05-20 DIAGNOSIS — Z961 Presence of intraocular lens: Secondary | ICD-10-CM | POA: Diagnosis present

## 2021-05-20 DIAGNOSIS — I10 Essential (primary) hypertension: Secondary | ICD-10-CM | POA: Diagnosis present

## 2021-05-20 DIAGNOSIS — Z885 Allergy status to narcotic agent status: Secondary | ICD-10-CM

## 2021-05-20 DIAGNOSIS — J449 Chronic obstructive pulmonary disease, unspecified: Secondary | ICD-10-CM | POA: Diagnosis present

## 2021-05-20 DIAGNOSIS — Z7982 Long term (current) use of aspirin: Secondary | ICD-10-CM

## 2021-05-20 DIAGNOSIS — I2510 Atherosclerotic heart disease of native coronary artery without angina pectoris: Secondary | ICD-10-CM | POA: Diagnosis present

## 2021-05-20 DIAGNOSIS — N1831 Chronic kidney disease, stage 3a: Secondary | ICD-10-CM | POA: Diagnosis present

## 2021-05-20 DIAGNOSIS — S72001A Fracture of unspecified part of neck of right femur, initial encounter for closed fracture: Principal | ICD-10-CM | POA: Diagnosis present

## 2021-05-20 DIAGNOSIS — E785 Hyperlipidemia, unspecified: Secondary | ICD-10-CM | POA: Diagnosis present

## 2021-05-20 DIAGNOSIS — K5900 Constipation, unspecified: Secondary | ICD-10-CM | POA: Diagnosis present

## 2021-05-20 DIAGNOSIS — M1611 Unilateral primary osteoarthritis, right hip: Secondary | ICD-10-CM | POA: Diagnosis not present

## 2021-05-20 DIAGNOSIS — N183 Chronic kidney disease, stage 3 unspecified: Secondary | ICD-10-CM | POA: Diagnosis present

## 2021-05-20 DIAGNOSIS — I1310 Hypertensive heart and chronic kidney disease without heart failure, with stage 1 through stage 4 chronic kidney disease, or unspecified chronic kidney disease: Secondary | ICD-10-CM | POA: Diagnosis present

## 2021-05-20 DIAGNOSIS — M47816 Spondylosis without myelopathy or radiculopathy, lumbar region: Secondary | ICD-10-CM | POA: Diagnosis not present

## 2021-05-20 DIAGNOSIS — Z955 Presence of coronary angioplasty implant and graft: Secondary | ICD-10-CM

## 2021-05-20 DIAGNOSIS — F419 Anxiety disorder, unspecified: Secondary | ICD-10-CM | POA: Diagnosis present

## 2021-05-20 DIAGNOSIS — J9811 Atelectasis: Secondary | ICD-10-CM | POA: Diagnosis not present

## 2021-05-20 DIAGNOSIS — Z794 Long term (current) use of insulin: Secondary | ICD-10-CM

## 2021-05-20 DIAGNOSIS — G319 Degenerative disease of nervous system, unspecified: Secondary | ICD-10-CM | POA: Diagnosis not present

## 2021-05-20 DIAGNOSIS — Z471 Aftercare following joint replacement surgery: Secondary | ICD-10-CM | POA: Diagnosis not present

## 2021-05-20 DIAGNOSIS — F32A Depression, unspecified: Secondary | ICD-10-CM | POA: Diagnosis present

## 2021-05-20 DIAGNOSIS — E1129 Type 2 diabetes mellitus with other diabetic kidney complication: Secondary | ICD-10-CM | POA: Diagnosis present

## 2021-05-20 DIAGNOSIS — E559 Vitamin D deficiency, unspecified: Secondary | ICD-10-CM | POA: Diagnosis present

## 2021-05-20 LAB — GLUCOSE, CAPILLARY
Glucose-Capillary: 185 mg/dL — ABNORMAL HIGH (ref 70–99)
Glucose-Capillary: 234 mg/dL — ABNORMAL HIGH (ref 70–99)

## 2021-05-20 LAB — CBC WITH DIFFERENTIAL/PLATELET
Abs Immature Granulocytes: 0.05 10*3/uL (ref 0.00–0.07)
Basophils Absolute: 0 10*3/uL (ref 0.0–0.1)
Basophils Relative: 0 %
Eosinophils Absolute: 0.1 10*3/uL (ref 0.0–0.5)
Eosinophils Relative: 1 %
HCT: 34.7 % — ABNORMAL LOW (ref 36.0–46.0)
Hemoglobin: 11.6 g/dL — ABNORMAL LOW (ref 12.0–15.0)
Immature Granulocytes: 1 %
Lymphocytes Relative: 22 %
Lymphs Abs: 2 10*3/uL (ref 0.7–4.0)
MCH: 31.4 pg (ref 26.0–34.0)
MCHC: 33.4 g/dL (ref 30.0–36.0)
MCV: 93.8 fL (ref 80.0–100.0)
Monocytes Absolute: 0.6 10*3/uL (ref 0.1–1.0)
Monocytes Relative: 7 %
Neutro Abs: 6.5 10*3/uL (ref 1.7–7.7)
Neutrophils Relative %: 69 %
Platelets: 192 10*3/uL (ref 150–400)
RBC: 3.7 MIL/uL — ABNORMAL LOW (ref 3.87–5.11)
RDW: 13.9 % (ref 11.5–15.5)
WBC: 9.4 10*3/uL (ref 4.0–10.5)
nRBC: 0 % (ref 0.0–0.2)

## 2021-05-20 LAB — COMPREHENSIVE METABOLIC PANEL
ALT: 16 U/L (ref 0–44)
AST: 26 U/L (ref 15–41)
Albumin: 4.1 g/dL (ref 3.5–5.0)
Alkaline Phosphatase: 55 U/L (ref 38–126)
Anion gap: 7 (ref 5–15)
BUN: 18 mg/dL (ref 8–23)
CO2: 28 mmol/L (ref 22–32)
Calcium: 9.6 mg/dL (ref 8.9–10.3)
Chloride: 100 mmol/L (ref 98–111)
Creatinine, Ser: 1.05 mg/dL — ABNORMAL HIGH (ref 0.44–1.00)
GFR, Estimated: 54 mL/min — ABNORMAL LOW (ref >60)
Glucose, Bld: 128 mg/dL — ABNORMAL HIGH (ref 70–99)
Potassium: 4.1 mmol/L (ref 3.5–5.1)
Sodium: 135 mmol/L (ref 135–145)
Total Bilirubin: 0.9 mg/dL (ref 0.3–1.2)
Total Protein: 7 g/dL (ref 6.5–8.1)

## 2021-05-20 LAB — TYPE AND SCREEN
ABO/RH(D): A POS
Antibody Screen: NEGATIVE

## 2021-05-20 LAB — PROTIME-INR
INR: 1 (ref 0.8–1.2)
Prothrombin Time: 13.5 seconds (ref 11.4–15.2)

## 2021-05-20 LAB — RESP PANEL BY RT-PCR (FLU A&B, COVID) ARPGX2
Influenza A by PCR: NEGATIVE
Influenza B by PCR: NEGATIVE
SARS Coronavirus 2 by RT PCR: NEGATIVE

## 2021-05-20 LAB — SURGICAL PCR SCREEN
MRSA, PCR: NEGATIVE
Staphylococcus aureus: NEGATIVE

## 2021-05-20 LAB — SAMPLE TO BLOOD BANK

## 2021-05-20 LAB — APTT: aPTT: 31 seconds (ref 24–36)

## 2021-05-20 LAB — CBG MONITORING, ED: Glucose-Capillary: 101 mg/dL — ABNORMAL HIGH (ref 70–99)

## 2021-05-20 MED ORDER — MIRABEGRON ER 50 MG PO TB24
50.0000 mg | ORAL_TABLET | Freq: Every day | ORAL | Status: DC
  Administered 2021-05-21 – 2021-05-29 (×9): 50 mg via ORAL
  Filled 2021-05-20 (×9): qty 1

## 2021-05-20 MED ORDER — CEFAZOLIN (ANCEF) 1 G IV SOLR: 2.0000 g | INTRAVENOUS | Status: DC

## 2021-05-20 MED ORDER — FENTANYL CITRATE (PF) 100 MCG/2ML IJ SOLN
INTRAMUSCULAR | Status: AC
  Filled 2021-05-20: qty 2

## 2021-05-20 MED ORDER — ACETAMINOPHEN 325 MG PO TABS
650.0000 mg | ORAL_TABLET | Freq: Four times a day (QID) | ORAL | Status: DC | PRN
  Administered 2021-05-22 – 2021-05-23 (×2): 650 mg via ORAL
  Filled 2021-05-20 (×2): qty 2

## 2021-05-20 MED ORDER — OXYCODONE-ACETAMINOPHEN 5-325 MG PO TABS
1.0000 | ORAL_TABLET | ORAL | Status: DC | PRN
  Administered 2021-05-20 – 2021-05-28 (×10): 1 via ORAL
  Filled 2021-05-20 (×10): qty 1

## 2021-05-20 MED ORDER — ADULT MULTIVITAMIN W/MINERALS CH
1.0000 | ORAL_TABLET | Freq: Every day | ORAL | Status: DC
  Administered 2021-05-21 – 2021-05-29 (×9): 1 via ORAL
  Filled 2021-05-20 (×9): qty 1

## 2021-05-20 MED ORDER — LOSARTAN POTASSIUM 50 MG PO TABS
50.0000 mg | ORAL_TABLET | Freq: Every day | ORAL | Status: DC
  Administered 2021-05-21 – 2021-05-29 (×9): 50 mg via ORAL
  Filled 2021-05-20 (×9): qty 1

## 2021-05-20 MED ORDER — NITROGLYCERIN 0.4 MG SL SUBL: 0.4000 mg | SUBLINGUAL_TABLET | SUBLINGUAL | Status: DC | PRN

## 2021-05-20 MED ORDER — SENNOSIDES-DOCUSATE SODIUM 8.6-50 MG PO TABS
1.0000 | ORAL_TABLET | Freq: Every evening | ORAL | Status: DC | PRN
  Administered 2021-05-23: 1 via ORAL
  Filled 2021-05-20: qty 1

## 2021-05-20 MED ORDER — IPRATROPIUM BROMIDE 0.06 % NA SOLN
2.0000 | Freq: Four times a day (QID) | NASAL | Status: DC | PRN
  Filled 2021-05-20: qty 15

## 2021-05-20 MED ORDER — PRAVASTATIN SODIUM 20 MG PO TABS
80.0000 mg | ORAL_TABLET | Freq: Every day | ORAL | Status: DC
  Administered 2021-05-21 – 2021-05-29 (×9): 80 mg via ORAL
  Filled 2021-05-20 (×9): qty 4

## 2021-05-20 MED ORDER — AMLODIPINE BESYLATE 5 MG PO TABS
5.0000 mg | ORAL_TABLET | Freq: Every day | ORAL | Status: DC
  Administered 2021-05-21 – 2021-05-25 (×5): 5 mg via ORAL
  Filled 2021-05-20 (×5): qty 1

## 2021-05-20 MED ORDER — DM-GUAIFENESIN ER 30-600 MG PO TB12
1.0000 | ORAL_TABLET | Freq: Two times a day (BID) | ORAL | Status: DC | PRN
  Filled 2021-05-20: qty 1

## 2021-05-20 MED ORDER — CEFAZOLIN SODIUM-DEXTROSE 2-4 GM/100ML-% IV SOLN
2.0000 g | INTRAVENOUS | Status: AC
  Administered 2021-05-21: 2 g via INTRAVENOUS

## 2021-05-20 MED ORDER — MORPHINE SULFATE (PF) 2 MG/ML IV SOLN
1.0000 mg | INTRAVENOUS | Status: DC | PRN
  Administered 2021-05-20 – 2021-05-21 (×3): 1 mg via INTRAVENOUS
  Filled 2021-05-20 (×3): qty 1

## 2021-05-20 MED ORDER — INSULIN ASPART 100 UNIT/ML IJ SOLN
0.0000 [IU] | Freq: Every day | INTRAMUSCULAR | Status: DC
  Administered 2021-05-21: 2 [IU] via SUBCUTANEOUS
  Filled 2021-05-20: qty 1

## 2021-05-20 MED ORDER — MUPIROCIN 2 % EX OINT
1.0000 "application " | TOPICAL_OINTMENT | Freq: Two times a day (BID) | CUTANEOUS | Status: AC
  Administered 2021-05-20 – 2021-05-25 (×9): 1 via NASAL
  Filled 2021-05-20: qty 22

## 2021-05-20 MED ORDER — LORAZEPAM 2 MG/ML IJ SOLN: 0.5000 mg | Freq: Three times a day (TID) | INTRAMUSCULAR | Status: DC | PRN

## 2021-05-20 MED ORDER — PANTOPRAZOLE SODIUM 40 MG PO TBEC
40.0000 mg | DELAYED_RELEASE_TABLET | Freq: Two times a day (BID) | ORAL | Status: DC
  Administered 2021-05-20 – 2021-05-29 (×18): 40 mg via ORAL
  Filled 2021-05-20 (×18): qty 1

## 2021-05-20 MED ORDER — ASPIRIN EC 81 MG PO TBEC
81.0000 mg | DELAYED_RELEASE_TABLET | Freq: Every day | ORAL | Status: DC
  Administered 2021-05-22 – 2021-05-29 (×8): 81 mg via ORAL
  Filled 2021-05-20 (×8): qty 1

## 2021-05-20 MED ORDER — LATANOPROST 0.005 % OP SOLN
1.0000 [drp] | Freq: Every day | OPHTHALMIC | Status: DC
  Administered 2021-05-20 – 2021-05-28 (×9): 1 [drp] via OPHTHALMIC
  Filled 2021-05-20: qty 2.5

## 2021-05-20 MED ORDER — INSULIN ASPART 100 UNIT/ML IJ SOLN
0.0000 [IU] | Freq: Three times a day (TID) | INTRAMUSCULAR | Status: DC
  Administered 2021-05-20: 2 [IU] via SUBCUTANEOUS
  Administered 2021-05-21 (×2): 1 [IU] via SUBCUTANEOUS
  Administered 2021-05-22: 3 [IU] via SUBCUTANEOUS
  Administered 2021-05-22 – 2021-05-23 (×4): 2 [IU] via SUBCUTANEOUS
  Administered 2021-05-23: 3 [IU] via SUBCUTANEOUS
  Administered 2021-05-24: 2 [IU] via SUBCUTANEOUS
  Administered 2021-05-24: 3 [IU] via SUBCUTANEOUS
  Administered 2021-05-24 – 2021-05-25 (×3): 2 [IU] via SUBCUTANEOUS
  Administered 2021-05-25: 3 [IU] via SUBCUTANEOUS
  Administered 2021-05-26 – 2021-05-27 (×4): 2 [IU] via SUBCUTANEOUS
  Administered 2021-05-27: 3 [IU] via SUBCUTANEOUS
  Administered 2021-05-27 – 2021-05-28 (×2): 1 [IU] via SUBCUTANEOUS
  Administered 2021-05-28: 2 [IU] via SUBCUTANEOUS
  Administered 2021-05-29: 1 [IU] via SUBCUTANEOUS
  Administered 2021-05-29: 2 [IU] via SUBCUTANEOUS
  Filled 2021-05-20 (×24): qty 1

## 2021-05-20 MED ORDER — ALBUTEROL SULFATE HFA 108 (90 BASE) MCG/ACT IN AERS
2.0000 | INHALATION_SPRAY | RESPIRATORY_TRACT | Status: DC | PRN
  Filled 2021-05-20: qty 6.7

## 2021-05-20 MED ORDER — FENTANYL CITRATE (PF) 100 MCG/2ML IJ SOLN
50.0000 ug | Freq: Once | INTRAMUSCULAR | Status: AC
Start: 2021-05-20 — End: 2021-05-20
  Administered 2021-05-20: 50 ug via INTRAVENOUS
  Filled 2021-05-20: qty 2

## 2021-05-20 MED ORDER — HYDRALAZINE HCL 20 MG/ML IJ SOLN
5.0000 mg | INTRAMUSCULAR | Status: DC | PRN
  Administered 2021-05-21: 5 mg via INTRAVENOUS
  Filled 2021-05-20: qty 1

## 2021-05-20 MED ORDER — SODIUM CHLORIDE 0.9 % IV SOLN: INTRAVENOUS | Status: DC

## 2021-05-20 MED ORDER — ONDANSETRON HCL 4 MG/2ML IJ SOLN
4.0000 mg | Freq: Three times a day (TID) | INTRAMUSCULAR | Status: DC | PRN
  Administered 2021-05-20: 4 mg via INTRAVENOUS
  Filled 2021-05-20: qty 2

## 2021-05-20 MED ORDER — FENTANYL CITRATE (PF) 100 MCG/2ML IJ SOLN
50.0000 ug | Freq: Once | INTRAMUSCULAR | Status: AC
  Administered 2021-05-20: 50 ug via INTRAVENOUS

## 2021-05-20 NOTE — ED Notes (Signed)
Pt provided warm blanket as requested 

## 2021-05-20 NOTE — ED Provider Notes (Signed)
[facility] Emergency Department Provider Note  ____________________________________________   Event Date/Time   First MD Initiated Contact with Patient 05/20/21 1000     (approximate)  I have reviewed the triage vital signs and the nursing notes.   HISTORY  Chief Complaint Fall   HPI Breanna Petty is a 80 y.o. female with a past medical history of HTN, HDL, CAD, GERD, anxiety, and DM who presents for assessment of right hip pain after she states she tripped walking down some steps following the right hip.  She adamantly denies passing out or feeling lightheaded dizzy or any other associated sick symptoms.  She states she did not hit her head or neck and has no pain other than in her right hip.  She specifically denies any chest pain, Donnell pain, back pain left lower extremity pain or upper extremity pain.  No other impulsive injuries.  States he has not had any recent fevers, cough, nausea, vomiting, diarrhea, dysuria, rash or any other recent sick symptoms or injuries.         Past Medical History:  Diagnosis Date   Acute diverticulitis 10/02/2016   Anginal pain ([place])    Anxiety    Aortic atherosclerosis ([place])    Arthritis    Atherosclerotic heart disease    Depression    Diabetes mellitus without complication ([place])    Diverticulosis of colon    Dysrhythmia    GERD (gastroesophageal reflux disease)    Hiatal hernia    History of heart artery stent    History of shingles    HOH (hard of hearing)    Hyperlipidemia    Hypertension    Myocardial infarction [facility])    Sarcoidosis    Vitamin D deficiency     Patient Active Problem List   Diagnosis Date Noted   Status post exploratory laparotomy 10/21/2016   Myocardial infarction ([place]) 10/20/2016   SBO (small bowel obstruction) (HCC)    Aortic atherosclerosis ([place]) 10/02/2016   Diverticulosis of large intestine without hemorrhage 10/02/2016   Hammer toe of right foot 10/16/2015   Nail  deformity 10/16/2015   Chronic obstructive pulmonary disease ([place]) 09/04/2015   History of MI (myocardial infarction) 09/04/2015   Spondylolisthesis at L4-L5 level 07/11/2015   DDD (degenerative disc disease), lumbar 07/11/2015   History of sarcoidosis 06/04/2015   Urge incontinence of urine 06/04/2015   Hearing loss of both ears 05/07/2015   Anxiety and depression 05/04/2015   Arteriosclerosis of coronary artery 05/04/2015   Type 2 diabetes mellitus with microalbuminuria, without long-term current use of insulin ([place]) 05/04/2015   Dyslipidemia 05/04/2015   Hypertension, benign 05/04/2015   Gastro-esophageal reflux disease without esophagitis 05/04/2015   Hiatal hernia 05/04/2015   HZV (herpes zoster virus) post herpetic neuralgia 05/04/2015   Vitamin D deficiency 05/04/2015   S/P coronary artery stent placement 05/04/2015    Past Surgical History:  Procedure Laterality Date   ABDOMINAL HYSTERECTOMY  1979   APPENDECTOMY     CARDIAC SURGERY     CATARACT EXTRACTION W/PHACO Right 10/28/2017   Procedure: CATARACT EXTRACTION PHACO AND INTRAOCULAR LENS PLACEMENT ([place]);  Surgeon: Eulogio Bear, MD;  Location: [facility] ORS;  Service: Ophthalmology;  Laterality: Right;  Lot # [id number] H [place]: 00:53.9 AP%: 10.6 CDE: 5.71   CORONARY ANGIOPLASTY     STENT   LAPAROTOMY N/A 10/04/2016   Procedure: EXPLORATORY LAPAROTOMY;  Surgeon: Dia Crawford III, MD;  Location: [facility] ORS;  Service: General;  Laterality: N/A;  Prior to Admission medications   Medication Sig Start Date End Date Taking? Authorizing Provider  amLODipine (NORVASC) 5 MG tablet Take 1 tablet (5 mg total) by mouth daily. 12/23/18   Juline Patch, MD  aspirin EC 81 MG tablet Take 1 tablet (81 mg total) by mouth daily. 12/29/16   Juline Patch, MD  glucose blood (FREESTYLE LITE) test strip USE AS INSTRUCTED 11/13/19   Hubbard Hartshorn, FNP  ipratropium (ATROVENT) 0.06 % nasal spray Place 2 sprays into both nostrils 4 (four) times daily  as needed for rhinitis. 07/06/20   Coral Spikes, DO  Lancets (FREESTYLE) lancets TEST 2 TIMES A DAY 12/23/20 12/23/21  Whitaker, Corene Cornea Hestle, PA-C  latanoprost (XALATAN) 0.005 % ophthalmic solution PLACE 1 DROP INTO BOTH EYES AT BEDTIME 08/26/20 08/26/21  Eulogio Bear, MD  losartan (COZAAR) 50 MG tablet Take 1 tablet (50 mg total) by mouth daily. 12/23/18   Juline Patch, MD  meloxicam (MOBIC) 15 MG tablet Take 1 tablet (15 mg total) by mouth once daily for 10 days 04/22/21     methocarbamol (ROBAXIN) 500 MG tablet Take 1 tablet (500 mg total) by mouth 2 (two) times daily as needed for up to 10 days 02/26/21     mirabegron ER (MYRBETRIQ) 50 MG TB24 tablet Take 1 tablet (50 mg total) by mouth daily. 04/07/21   Bjorn Loser, MD  Multiple Vitamin (MULTIVITAMIN) capsule Take 1 capsule by mouth daily.    [provider]  NITROSTAT 0.4 MG SL tablet DISSOLVE ONE TABLET UNDER TONGUE AS NEED FOR CHEST PAIN AS DIRECTED 11/28/15   Ancil Boozer, Drue Stager, MD  ondansetron (ZOFRAN ODT) 4 MG disintegrating tablet Take 1 tablet (4 mg total) by mouth every 8 (eight) hours as needed for nausea or vomiting. 05/17/21   Coral Spikes, DO  pantoprazole (PROTONIX) 40 MG tablet TAKE 1 TABLET BY MOUTH 2 TIMES DAILY BEFORE MEALS 10/31/20 10/31/21  Whitaker, Corene Cornea Hestle, PA-C  pravastatin (PRAVACHOL) 80 MG tablet Take 1 tablet (80 mg total) by mouth nightly 03/20/21     sitaGLIPtin (JANUVIA) 100 MG tablet TAKE 1 TABLET (100 MG TOTAL) BY MOUTH DAILY. 07/10/19   Hubbard Hartshorn, FNP  sitaGLIPtin (JANUVIA) 100 MG tablet TAKE 1 TABLET (100 MG TOTAL) BY MOUTH ONCE DAILY 03/19/21 03/19/22      Allergies Ace inhibitors, Codeine, Codeine sulfate, Ezetimibe, and Other  Family History  Problem Relation Age of Onset   Diabetes Daughter    Hypertension Mother    Other Father        MVA   Diabetes Son    Breast cancer Neg Hx     Social History Social History   Tobacco Use   Smoking status: Former    Packs/day: 2.00    Years:  10.00    Pack years: 20.00    Types: Cigarettes    Quit date: 11/16/1988    Years since quitting: 32.5   Smokeless tobacco: Never  Vaping Use   Vaping Use: Never used  Substance Use Topics   Alcohol use: No    Alcohol/week: 0.0 standard drinks   Drug use: No    Review of Systems  Review of Systems  Constitutional:  Negative for chills and fever.  HENT:  Negative for sore throat.   Eyes:  Negative for pain.  Respiratory:  Negative for cough and stridor.   Cardiovascular:  Negative for chest pain.  Gastrointestinal:  Negative for vomiting.  Genitourinary:  Negative for dysuria.  Musculoskeletal:  Positive for joint pain (R hip) and myalgias (R hip).  Skin:  Negative for rash.  Neurological:  Negative for seizures, loss of consciousness and headaches.  Psychiatric/Behavioral:  Negative for suicidal ideas.   All other systems reviewed and are negative.    ____________________________________________   PHYSICAL EXAM:  VITAL SIGNS: ED Triage Vitals  Enc Vitals Group     BP      Pulse      Resp      Temp      Temp src      SpO2      Weight      Height      Head Circumference      Peak Flow      Pain Score      Pain Loc      Pain Edu?      Excl. in [place]?    Vitals:   05/20/21 1030 05/20/21 1100  BP: (!) 170/53 (!) 172/73  Pulse: 74 76  Resp: 19 20  Temp:    SpO2: 100% 100%   Physical Exam Vitals and nursing note reviewed.  Constitutional:      General: She is not in acute distress.    Appearance: She is well-developed.  HENT:     Head: Normocephalic and atraumatic.     Right Ear: External ear normal.     Left Ear: External ear normal.     Nose: Nose normal.  Eyes:     Conjunctiva/sclera: Conjunctivae normal.  Cardiovascular:     Rate and Rhythm: Normal rate and regular rhythm.     Heart sounds: No murmur heard. Pulmonary:     Effort: Pulmonary effort is normal. No respiratory distress.     Breath sounds: Normal breath sounds.  Abdominal:      Palpations: Abdomen is soft.     Tenderness: There is no abdominal tenderness.  Musculoskeletal:     Cervical back: Neck supple.  Skin:    General: Skin is warm and dry.     Capillary Refill: Capillary refill takes 2 to 3 seconds.  Neurological:     Mental Status: She is alert and oriented to person, place, and time.  Psychiatric:        Mood and Affect: Mood normal.    Cranial nerves II through XII grossly intact.  Patient is symmetric strength in her bilateral upper extremities about her left lower extremity.  She is able to wiggle her toes in her right lower extremity but is otherwise unable to flex or extend at the hip or knee.  Sensation is intact light touch all extremities.  2+ radial and DP pulses.  No tenderness step-offs or deformities over the C/T/L-spine.  No other trauma to the face scalp head or neck chest abdomen or back.  There are some tenderness over the right hip and significant pain on any passive range of motion. ____________________________________________   LABS (all labs ordered are listed, but only abnormal results are displayed)  Labs Reviewed  CBC WITH DIFFERENTIAL/PLATELET - Abnormal; Notable for the following components:      Result Value   RBC 3.70 (*)    Hemoglobin 11.6 (*)    HCT 34.7 (*)    All other components within normal limits  COMPREHENSIVE METABOLIC PANEL - Abnormal; Notable for the following components:   Glucose, Bld 128 (*)    Creatinine, Ser 1.05 (*)    GFR, Estimated 54 (*)    All other components within normal  limits  RESP PANEL BY RT-PCR (FLU A&B, COVID) ARPGX2  PROTIME-INR  SAMPLE TO BLOOD BANK  TYPE AND SCREEN   ____________________________________________  EKG  Sinus rhythm with a ventricular to 71, artifact in aVL and V2 without appearance of acute ischemia or significant during arrhythmia. ____________________________________________  RADIOLOGY  ED MD interpretation: Chest x-ray obtained due to preop purposes shows stable  cardiomegaly without pulmonary congestion, focal consolidation, rib fracture, pneumothorax or other clear acute intrathoracic process.  Plain film of the right hip shows angulated fracture of the right femoral neck.  Official radiology report(s): DG Chest 1 View  Result Date: 05/20/2021 CLINICAL DATA:  Fall. EXAM: CHEST  1 VIEW COMPARISON:  01/23/2020.  11/28/2018. FINDINGS: Surgical clip noted over the mid chest. Mediastinum and hilar structures normal. Cardiomegaly. No pulmonary venous congestion. No focal infiltrate. Mild left base subsegmental atelectasis and or scarring. No pleural effusion or pneumothorax. Questionable soft tissue calcification over the left neck of questionable etiology. Calcifications noted the spleen suggesting calcified granulomas. IMPRESSION: 1.  Cardiomegaly.  No pulmonary venous congestion. 2. Mild left base subsegmental atelectasis consistent scarring. No acute pulmonary disease. 3. Degenerative change thoracic spine. No acute bony abnormality. No pneumothorax. Electronically Signed   By: Marcello Moores  Register   On: 05/20/2021 11:18   DG Hip Unilat  With Pelvis 2-3 Views Right  Result Date: 05/20/2021 CLINICAL DATA:  Fall. EXAM: DG HIP (WITH OR WITHOUT PELVIS) 2-3V RIGHT COMPARISON:  CT 10/25/2020. FINDINGS: Angulated fracture of the right femoral neck. No evidence of dislocation however the cross-table lateral view is difficult to evaluate due to technique. Degenerative changes lumbar spine and both hips. Aortoiliac atherosclerotic vascular calcification. Pelvic calcifications consistent phleboliths. Catheter noted over the perineum. IMPRESSION: Angulated fracture of the right femoral neck. Electronically Signed   By: Marcello Moores  Register   On: 05/20/2021 11:15    ____________________________________________   PROCEDURES  Procedure(s) performed (including Critical Care):  .1-3 Lead EKG Interpretation  Date/Time: 05/20/2021 11:34 AM Performed by: Lucrezia Starch,  MD Authorized by: Lucrezia Starch, MD     Interpretation: normal     ECG rate assessment: normal     Rhythm: sinus rhythm     Ectopy: none     Conduction: normal     ____________________________________________   INITIAL IMPRESSION / ASSESSMENT AND PLAN / ED COURSE     Patient presents with above state history exam for acute onset of right hip pain associated weakness after mechanical ground-level fall described above.  She is afebrile and hemodynamically stable on arrival.  She is weak and tender in the right hip but neurovascular intact distally.  Differential includes fracture, contusion, hematoma and MSK strain.  Chest x-ray obtained due to preop purposes shows stable cardiomegaly without pulmonary congestion, focal consolidation, rib fracture, pneumothorax or other clear acute intrathoracic process.  Plain film of the right hip shows angulated fracture of the right femoral neck. CBC shows no acidosis and hemoglobin of 11.6.  CMP shows no significant electrolyte or metabolic derangements.  INR is unremarkable.  Screening COVID sent.  Discussed with on-call orthopedist Dr. Sharlet Salina who will evaluate the patient for possible operative repair.  He did recommend hospitalist admission.  I will admit to hospitalist service for further evaluation management.      ____________________________________________   FINAL CLINICAL IMPRESSION(S) / ED DIAGNOSES  Final diagnoses:  Closed fracture of right hip, initial encounter ([place])    Medications  fentaNYL (SUBLIMAZE) injection 50 mcg ( Intravenous Not Given 05/20/21 1040)  fentaNYL (  SUBLIMAZE) injection 50 mcg (50 mcg Intravenous Given 05/20/21 1105)     ED Discharge Orders     None        Note:  This document was prepared using Dragon voice recognition software and may include unintentional dictation errors.    Lucrezia Starch, MD 05/20/21 1134

## 2021-05-20 NOTE — ED Triage Notes (Addendum)
Pt to ED [facility] from home for fall down 3 steps after losing balance, denies hitting head or loc. Shortening noted to right leg. 10/10 pain.  Alert and oriented.  Pedal pulses felt

## 2021-05-20 NOTE — H&P (Signed)
History and Physical    Breanna Petty UTM:[id number] DOB: Nov 05, 1941 DOA: 05/20/2021  Referring MD/NP/PA:   PCP: Donnamarie Rossetti, PA-C   Patient coming from:  The patient is coming from home.  At baseline, pt is independent for most of ADL.        Chief Complaint: fall and right hip pain  HPI: Breanna Petty is a 80 y.o. female with medical history significant of hypertension, hyperlipidemia, diabetes mellitus, COPD not on oxygen, GERD, depression, anxiety, CAD with stent placement, hard of hearing, diverticulitis, sarcoidosis, small bowel obstruction, CKD stage IIIa, who presents with fall and right hip pain.    Pt states that she fell down 2 steps accidentally when she tripped her steps and lost balance at home. No LOC.  Denies head or neck injury.  Developed right hip pain, which is constant, sharp, 10 out of 10 severity, nonradiating.  No leg numbness or weakness.  Patient denies any chest pain, cough, shortness breath.  No fever or chills.  Denies nausea vomiting, diarrhea or abdominal pain.  No symptoms of UTI.  ED Course: pt was found to have WBC 9.4, INR 1.0, negative COVID PCR, renal function close to baseline, temperature normal, blood pressure 172/73, heart rate 76, RR 20, oxygen saturation 99-100% on room air.  Pending CT of head.  X-ray of right hip/pelvis that showed angulated right femoral neck fracture.  Patient is admitted to [place] bed as inpatient.  Dr. Sharlet Salina of Ortho is consulted.  Review of Systems:   General: no fevers, chills, no body weight gain, has fatigue HEENT: no blurry vision, hearing changes or sore throat Respiratory: no dyspnea, coughing, wheezing CV: no chest pain, no palpitations GI: no nausea, vomiting, abdominal pain, diarrhea, constipation GU: no dysuria, burning on urination, increased urinary frequency, hematuria  Ext: no leg edema Neuro: no unilateral weakness, numbness, or tingling, no vision change or hearing loss. Had fall. Skin:  no rash, no skin tear. MSK: has right hip pain Heme: No easy bruising.  Travel history: No recent long distant travel.  Allergy:  Allergies  Allergen Reactions   Ace Inhibitors     Other reaction(s): Cough   Codeine Swelling   Codeine Sulfate     Other reaction(s): Unknown   Ezetimibe     Other reaction(s): Muscle Pain   Other     Other reaction(s): Muscle Pain    Past Medical History:  Diagnosis Date   Acute diverticulitis 10/02/2016   Anginal pain (HCC)    Anxiety    Aortic atherosclerosis ([place])    Arthritis    Atherosclerotic heart disease    Depression    Diabetes mellitus without complication ([place])    Diverticulosis of colon    Dysrhythmia    GERD (gastroesophageal reflux disease)    Hiatal hernia    History of heart artery stent    History of shingles    HOH (hard of hearing)    Hyperlipidemia    Hypertension    Myocardial infarction ([place])    Sarcoidosis    Vitamin D deficiency     Past Surgical History:  Procedure Laterality Date   ABDOMINAL HYSTERECTOMY  1979   APPENDECTOMY     CARDIAC SURGERY     CATARACT EXTRACTION W/PHACO Right 10/28/2017   Procedure: CATARACT EXTRACTION PHACO AND INTRAOCULAR LENS PLACEMENT ([place]);  Surgeon: Eulogio Bear, MD;  Location: [facility] ORS;  Service: Ophthalmology;  Laterality: Right;  Lot # [id number] H [place]: 00:53.9 AP%: 10.6 CDE: 5.71  CORONARY ANGIOPLASTY     STENT   LAPAROTOMY N/A 10/04/2016   Procedure: EXPLORATORY LAPAROTOMY;  Surgeon: Dia Crawford III, MD;  Location: [facility] ORS;  Service: General;  Laterality: N/A;    Social History:  reports that she quit smoking about 32 years ago. Her smoking use included cigarettes. She has a 20.00 pack-year smoking history. She has never used smokeless tobacco. She reports that she does not drink alcohol and does not use drugs.  Family History:  Family History  Problem Relation Age of Onset   Diabetes Daughter    Hypertension Mother    Other Father        MVA   Diabetes Son     Breast cancer Neg Hx      Prior to Admission medications   Medication Sig Start Date End Date Taking? Authorizing Provider  amLODipine (NORVASC) 5 MG tablet Take 1 tablet (5 mg total) by mouth daily. 12/23/18   Juline Patch, MD  aspirin EC 81 MG tablet Take 1 tablet (81 mg total) by mouth daily. 12/29/16   Juline Patch, MD  glucose blood (FREESTYLE LITE) test strip USE AS INSTRUCTED 11/13/19   Hubbard Hartshorn, FNP  ipratropium (ATROVENT) 0.06 % nasal spray Place 2 sprays into both nostrils 4 (four) times daily as needed for rhinitis. 07/06/20   Coral Spikes, DO  Lancets (FREESTYLE) lancets TEST 2 TIMES A DAY 12/23/20 12/23/21  Whitaker, Corene Cornea Hestle, PA-C  latanoprost (XALATAN) 0.005 % ophthalmic solution PLACE 1 DROP INTO BOTH EYES AT BEDTIME 08/26/20 08/26/21  Eulogio Bear, MD  losartan (COZAAR) 50 MG tablet Take 1 tablet (50 mg total) by mouth daily. 12/23/18   Juline Patch, MD  meloxicam (MOBIC) 15 MG tablet Take 1 tablet (15 mg total) by mouth once daily for 10 days 04/22/21     methocarbamol (ROBAXIN) 500 MG tablet Take 1 tablet (500 mg total) by mouth 2 (two) times daily as needed for up to 10 days 02/26/21     mirabegron ER (MYRBETRIQ) 50 MG TB24 tablet Take 1 tablet (50 mg total) by mouth daily. 04/07/21   Bjorn Loser, MD  Multiple Vitamin (MULTIVITAMIN) capsule Take 1 capsule by mouth daily.    [provider]  NITROSTAT 0.4 MG SL tablet DISSOLVE ONE TABLET UNDER TONGUE AS NEED FOR CHEST PAIN AS DIRECTED 11/28/15   Ancil Boozer, Drue Stager, MD  ondansetron (ZOFRAN ODT) 4 MG disintegrating tablet Take 1 tablet (4 mg total) by mouth every 8 (eight) hours as needed for nausea or vomiting. 05/17/21   Coral Spikes, DO  pantoprazole (PROTONIX) 40 MG tablet TAKE 1 TABLET BY MOUTH 2 TIMES DAILY BEFORE MEALS 10/31/20 10/31/21  Whitaker, Corene Cornea Hestle, PA-C  pravastatin (PRAVACHOL) 80 MG tablet Take 1 tablet (80 mg total) by mouth nightly 03/20/21     sitaGLIPtin (JANUVIA) 100 MG tablet TAKE  1 TABLET (100 MG TOTAL) BY MOUTH DAILY. 07/10/19   Hubbard Hartshorn, FNP  sitaGLIPtin (JANUVIA) 100 MG tablet TAKE 1 TABLET (100 MG TOTAL) BY MOUTH ONCE DAILY 03/19/21 03/19/22      Physical Exam: Vitals:   05/20/21 1001 05/20/21 1008 05/20/21 1030 05/20/21 1100  BP:  (!) 157/76 (!) 170/53 (!) 172/73  Pulse:  76 74 76  Resp:   19 20  Temp:      TempSrc:      SpO2:  99% 100% 100%  Weight: 73.5 kg     Height: 5\' 11"  (1.803 m)  General: Not in acute distress HEENT:       Eyes: PERRL, EOMI, no scleral icterus.       ENT: No discharge from the ears and nose, no pharynx injection, no tonsillar enlargement.        Neck: No JVD, no bruit, no mass felt. Heme: No neck lymph node enlargement. Cardiac: S1/S2, RRR, No murmurs, No gallops or rubs. Respiratory: No rales, wheezing, rhonchi or rubs. GI: Soft, nondistended, nontender, no rebound pain, no organomegaly, BS present. GU: No hematuria Ext: No pitting leg edema bilaterally. 1+DP/PT pulse bilaterally. Musculoskeletal: has tenderness in right hip Skin: No rashes.  Neuro: Alert, oriented X3, cranial nerves II-XII grossly intact, moves all extremities normally.  Psych: Patient is not psychotic, no suicidal or hemocidal ideation.  Labs on Admission: I have personally reviewed following labs and imaging studies  CBC: Recent Labs  Lab 05/20/21 1004  WBC 9.4  NEUTROABS 6.5  HGB 11.6*  HCT 34.7*  MCV 93.8  PLT 431   Basic Metabolic Panel: Recent Labs  Lab 05/20/21 1004  NA 135  K 4.1  CL 100  CO2 28  GLUCOSE 128*  BUN 18  CREATININE 1.05*  CALCIUM 9.6   GFR: Estimated Creatinine Clearance: 48.6 mL/min (A) (by C-G formula based on SCr of 1.05 mg/dL (H)). Liver Function Tests: Recent Labs  Lab 05/20/21 1004  AST 26  ALT 16  ALKPHOS 55  BILITOT 0.9  PROT 7.0  ALBUMIN 4.1   No results for input(s): LIPASE, AMYLASE in the last 168 hours. No results for input(s): AMMONIA in the last 168 hours. Coagulation  Profile: Recent Labs  Lab 05/20/21 1004  INR 1.0   Cardiac Enzymes: No results for input(s): CKTOTAL, CKMB, CKMBINDEX, TROPONINI in the last 168 hours. BNP (last 3 results) No results for input(s): PROBNP in the last 8760 hours. HbA1C: No results for input(s): HGBA1C in the last 72 hours. CBG: No results for input(s): GLUCAP in the last 168 hours. Lipid Profile: No results for input(s): CHOL, HDL, LDLCALC, TRIG, CHOLHDL, LDLDIRECT in the last 72 hours. Thyroid Function Tests: No results for input(s): TSH, T4TOTAL, FREET4, T3FREE, THYROIDAB in the last 72 hours. Anemia Panel: No results for input(s): VITAMINB12, FOLATE, FERRITIN, TIBC, IRON, RETICCTPCT in the last 72 hours. Urine analysis:    Component Value Date/Time   COLORURINE YELLOW (A) 10/02/2016 1148   APPEARANCEUR Clear 04/07/2021 1517   LABSPEC 1.024 10/02/2016 1148   LABSPEC 1.029 01/21/2012 1557   PHURINE 5.0 10/02/2016 1148   GLUCOSEU Negative 04/07/2021 1517   GLUCOSEU Negative 01/21/2012 1557   HGBUR NEGATIVE 10/02/2016 1148   BILIRUBINUR Negative 04/07/2021 1517   BILIRUBINUR Negative 01/21/2012 1557   KETONESUR TRACE (A) 10/02/2016 1148   PROTEINUR Negative 04/07/2021 1517   PROTEINUR 30 (A) 10/02/2016 1148   UROBILINOGEN 0.2 11/24/2018 0929   NITRITE Negative 04/07/2021 1517   NITRITE NEGATIVE 10/02/2016 1148   LEUKOCYTESUR 1+ (A) 04/07/2021 1517   LEUKOCYTESUR Negative 01/21/2012 1557   Sepsis Labs: @LABRCNTIP (procalcitonin:4,lacticidven:4) ) Recent Results (from the past 240 hour(s))  SARS CORONAVIRUS 2 (TAT 6-24 HRS) Nasopharyngeal Nasopharyngeal Swab     Status: None   Collection Time: 05/17/21 12:56 PM   Specimen: Nasopharyngeal Swab  Result Value Ref Range Status   SARS Coronavirus 2 NEGATIVE NEGATIVE Final    Comment: (NOTE) SARS-CoV-2 target nucleic acids are NOT DETECTED.  The SARS-CoV-2 RNA is generally detectable in upper and lower respiratory specimens during the acute phase of  infection. Negative results  do not preclude SARS-CoV-2 infection, do not rule out co-infections with other pathogens, and should not be used as the sole basis for treatment or other patient management decisions. Negative results must be combined with clinical observations, patient history, and epidemiological information. The expected result is Negative.  Fact Sheet for Patients: SugarRoll.be  Fact Sheet for Healthcare Providers: https://www.woods-mathews.com/  This test is not yet approved or cleared by the [place] FDA and  has been authorized for detection and/or diagnosis of SARS-CoV-2 by FDA under an Emergency Use Authorization (EUA). This EUA will remain  in effect (meaning this test can be used) for the duration of the COVID-19 declaration under Se ction 564(b)(1) of the Act, 21 U.S.C. section 360bbb-3(b)(1), unless the authorization is terminated or revoked sooner.  Performed at [facility], [place] [address] [place], [place] [address]   Resp Panel by RT-PCR (Flu A&B, Covid) Nasopharyngeal Swab     Status: None   Collection Time: 05/20/21 10:31 AM   Specimen: Nasopharyngeal Swab; Nasopharyngeal(NP) swabs in vial transport medium  Result Value Ref Range Status   SARS Coronavirus 2 by RT PCR NEGATIVE NEGATIVE Final    Comment: (NOTE) SARS-CoV-2 target nucleic acids are NOT DETECTED.  The SARS-CoV-2 RNA is generally detectable in upper respiratory specimens during the acute phase of infection. The lowest concentration of SARS-CoV-2 viral copies this assay can detect is 138 copies/mL. A negative result does not preclude SARS-Cov-2 infection and should not be used as the sole basis for treatment or other patient management decisions. A negative result may occur with  improper specimen collection/handling, submission of specimen other than nasopharyngeal swab, presence of viral mutation(s) within the areas targeted by this  assay, and inadequate number of viral copies(<138 copies/mL). A negative result must be combined with clinical observations, patient history, and epidemiological information. The expected result is Negative.  Fact Sheet for Patients:  EntrepreneurPulse.com.au  Fact Sheet for Healthcare Providers:  IncredibleEmployment.be  This test is no t yet approved or cleared by the [place] FDA and  has been authorized for detection and/or diagnosis of SARS-CoV-2 by FDA under an Emergency Use Authorization (EUA). This EUA will remain  in effect (meaning this test can be used) for the duration of the COVID-19 declaration under Section 564(b)(1) of the Act, 21 U.S.C.section 360bbb-3(b)(1), unless the authorization is terminated  or revoked sooner.       Influenza A by PCR NEGATIVE NEGATIVE Final   Influenza B by PCR NEGATIVE NEGATIVE Final    Comment: (NOTE) The Xpert Xpress SARS-CoV-2/FLU/RSV plus assay is intended as an aid in the diagnosis of influenza from Nasopharyngeal swab specimens and should not be used as a sole basis for treatment. Nasal washings and aspirates are unacceptable for Xpert Xpress SARS-CoV-2/FLU/RSV testing.  Fact Sheet for Patients: EntrepreneurPulse.com.au  Fact Sheet for Healthcare Providers: IncredibleEmployment.be  This test is not yet approved or cleared by the [place] FDA and has been authorized for detection and/or diagnosis of SARS-CoV-2 by FDA under an Emergency Use Authorization (EUA). This EUA will remain in effect (meaning this test can be used) for the duration of the COVID-19 declaration under Section 564(b)(1) of the Act, 21 U.S.C. section 360bbb-3(b)(1), unless the authorization is terminated or revoked.  Performed at [facility], [address], [place], [place] [address]      Radiological Exams on Admission: DG Chest 1 View  Result Date:  05/20/2021 CLINICAL DATA:  Fall. EXAM: CHEST  1 VIEW COMPARISON:  01/23/2020.  11/28/2018. FINDINGS: Surgical clip noted over the mid chest. Mediastinum and hilar structures normal. Cardiomegaly. No pulmonary venous congestion. No focal infiltrate. Mild left base subsegmental atelectasis and or scarring. No pleural effusion or pneumothorax. Questionable soft tissue calcification over the left neck of questionable etiology. Calcifications noted the spleen suggesting calcified granulomas. IMPRESSION: 1.  Cardiomegaly.  No pulmonary venous congestion. 2. Mild left base subsegmental atelectasis consistent scarring. No acute pulmonary disease. 3. Degenerative change thoracic spine. No acute bony abnormality. No pneumothorax. Electronically Signed   By: Marcello Moores  Register   On: 05/20/2021 11:18   DG Hip Unilat  With Pelvis 2-3 Views Right  Result Date: 05/20/2021 CLINICAL DATA:  Fall. EXAM: DG HIP (WITH OR WITHOUT PELVIS) 2-3V RIGHT COMPARISON:  CT 10/25/2020. FINDINGS: Angulated fracture of the right femoral neck. No evidence of dislocation however the cross-table lateral view is difficult to evaluate due to technique. Degenerative changes lumbar spine and both hips. Aortoiliac atherosclerotic vascular calcification. Pelvic calcifications consistent phleboliths. Catheter noted over the perineum. IMPRESSION: Angulated fracture of the right femoral neck. Electronically Signed   By: Marcello Moores  Register   On: 05/20/2021 11:15     EKG: I have personally reviewed.  Sinus rhythm, QTC 421, early R wave progression, low voltage, nonspecific T wave change.   Assessment/Plan Principal Problem:   Closed right hip fracture (HCC) Active Problems:   Anxiety and depression   Dyslipidemia   Gastro-esophageal reflux disease without esophagitis   Chronic obstructive pulmonary disease (HCC)   CAD (coronary artery disease)   HTN (hypertension)   Type II diabetes mellitus with renal manifestations (HCC)   CKD (chronic kidney  disease), stage IIIa   Fall at home, initial encounter   Closed right hip fracture ([place]):  As evidenced by x-ray. Patient has moderate pain now. No neurovascular compromise. Orthopedic surgeon, Dr.  Sharlet Salina was consulted , planing to do surgery tomorrow  - will admit to Med-surg bed - Pain control: morphine prn and percocet - When necessary Zofran for nausea - Robaxin for muscle spasm - type and cross - INR/PTT - PT/OT when able to (not ordered now) - NPO after MN  Anxiety and depression -prn Ativan  Dyslipidemia -Pravastatin   Gastro-esophageal reflux disease without esophagitis -Protonix  Chronic obstructive pulmonary disease ([place]): Stable -Bronchodilators as needed  CAD (coronary artery disease): S/p of stent placement the patient does not have any chest pain or shortness breath. -As needed nitroglycerin -Lipitor -Continue aspirin, but will skip tomorrow for surgery, and then restart on 7/7  HTN (hypertension) -IV hydralazine as needed -Amlodipine, Cozaar,  Type II diabetes mellitus with renal manifestations ([place]): Recent A1c 6.7, well controlled.  Patient taking Januvia at home. -Sliding scale insulin  CKD (chronic kidney disease), stage IIIa: Close to baseline.  Baseline creatinine 0.96-1.0 recently.  Her creatinine is 1.05, BUN 18. -Follow-up with BMP  Fall at home, initial encounter: -Follow-up CT head -->negative -pt/ot when able to      Perioperative Cardiac Risk: pt has multiple comorbidities, including hypertension, hyperlipidemia, diabetes mellitus, COPD not on oxygen, GERD, depression, anxiety, CAD with stent placement, hard of hearing, diverticulitis, sarcoidosis, small bowel obstruction, CKD stage IIIa.   Currently patient is active and independent of ADLs and, IADLs. No recent acute cardiac issues.  Patient does not have chest pain, shortness of breath, palpitation, leg edema.  No signs of acute CHF exacerbation currently.  EKG has no acute change.  At this time point, no further work up is needed. Patient's GUPTA score perioperative  myocardial infarction or cardaic arrest is 0.8 %.      DVT ppx: SCD Code Status: Full code Family Communication:  Yes, patient's son by phone Disposition Plan:  Anticipate discharge back to previous environment Consults called:  Dr. Sharlet Salina of of ortho Admission status and Level of care: Med-Surg:   as inpt       Status is: Inpatient  Remains inpatient appropriate because:Inpatient level of care appropriate due to severity of illness  Dispo: The patient is from: Home              Anticipated d/c is to:  to be determined              Patient currently is not medically stable to d/c.   Difficult to place patient No         Date of Service 05/20/2021    [facility]   If 7PM-7AM, please contact night-coverage [email or web address] 05/20/2021, 11:57 AM

## 2021-05-20 NOTE — ED Notes (Signed)
Admitting MD at bedside.

## 2021-05-20 NOTE — ED Notes (Signed)
Pt to XRAY

## 2021-05-20 NOTE — ED Notes (Signed)
Type and screen sent to lab.

## 2021-05-20 NOTE — ED Notes (Signed)
Pt provided pillows and warm blanket. Placed on purewick. Call bell in reach.

## 2021-05-20 NOTE — ED Notes (Signed)
RN to bedside to answer call bell. Pt expressing frustration d/t finding out she was being admitted from registration and not knowing what was going on with her leg. Dr Tamala Julian notified to update pt regarding plan of care

## 2021-05-20 NOTE — Plan of Care (Signed)
New admission

## 2021-05-20 NOTE — ED Notes (Signed)
Lab contacted to add on type and screen

## 2021-05-21 ENCOUNTER — Inpatient Hospital Stay: Payer: 59 | Admitting: Anesthesiology

## 2021-05-21 ENCOUNTER — Encounter: Payer: Self-pay | Admitting: Internal Medicine

## 2021-05-21 ENCOUNTER — Encounter
Admission: EM | Disposition: A | Discharge status: Skilled Nursing Facility | Payer: Self-pay | Source: Home / Self Care | Attending: Internal Medicine

## 2021-05-21 ENCOUNTER — Inpatient Hospital Stay: Payer: 59

## 2021-05-21 DIAGNOSIS — J449 Chronic obstructive pulmonary disease, unspecified: Secondary | ICD-10-CM | POA: Diagnosis not present

## 2021-05-21 DIAGNOSIS — W19XXXA Unspecified fall, initial encounter: Secondary | ICD-10-CM | POA: Diagnosis not present

## 2021-05-21 DIAGNOSIS — S72001A Fracture of unspecified part of neck of right femur, initial encounter for closed fracture: Secondary | ICD-10-CM | POA: Diagnosis not present

## 2021-05-21 DIAGNOSIS — F419 Anxiety disorder, unspecified: Secondary | ICD-10-CM | POA: Diagnosis not present

## 2021-05-21 HISTORY — PX: HIP ARTHROPLASTY: SHX981

## 2021-05-21 LAB — CBC
HCT: 35.1 % — ABNORMAL LOW (ref 36.0–46.0)
Hemoglobin: 11.8 g/dL — ABNORMAL LOW (ref 12.0–15.0)
MCH: 30.7 pg (ref 26.0–34.0)
MCHC: 33.6 g/dL (ref 30.0–36.0)
MCV: 91.4 fL (ref 80.0–100.0)
Platelets: 188 10*3/uL (ref 150–400)
RBC: 3.84 MIL/uL — ABNORMAL LOW (ref 3.87–5.11)
RDW: 13.8 % (ref 11.5–15.5)
WBC: 8.2 10*3/uL (ref 4.0–10.5)
nRBC: 0 % (ref 0.0–0.2)

## 2021-05-21 LAB — GLUCOSE, CAPILLARY
Glucose-Capillary: 133 mg/dL — ABNORMAL HIGH (ref 70–99)
Glucose-Capillary: 126 mg/dL — ABNORMAL HIGH (ref 70–99)
Glucose-Capillary: 119 mg/dL — ABNORMAL HIGH (ref 70–99)
Glucose-Capillary: 143 mg/dL — ABNORMAL HIGH (ref 70–99)
Glucose-Capillary: 211 mg/dL — ABNORMAL HIGH (ref 70–99)

## 2021-05-21 SURGERY — HEMIARTHROPLASTY, HIP, DIRECT ANTERIOR APPROACH, FOR FRACTURE
Anesthesia: General | Site: Hip | Laterality: Right

## 2021-05-21 MED ORDER — DEXAMETHASONE SODIUM PHOSPHATE 10 MG/ML IJ SOLN
INTRAMUSCULAR | Status: DC | PRN
  Administered 2021-05-21: 4 mg via INTRAVENOUS

## 2021-05-21 MED ORDER — BUPIVACAINE HCL (PF) 0.5 % IJ SOLN
INTRAMUSCULAR | Status: AC
  Filled 2021-05-21: qty 10

## 2021-05-21 MED ORDER — MIDAZOLAM HCL 2 MG/2ML IJ SOLN
INTRAMUSCULAR | Status: AC
  Filled 2021-05-21: qty 2

## 2021-05-21 MED ORDER — ONDANSETRON HCL 4 MG/2ML IJ SOLN
INTRAMUSCULAR | Status: DC | PRN
  Administered 2021-05-21: 4 mg via INTRAVENOUS

## 2021-05-21 MED ORDER — EPHEDRINE SULFATE 50 MG/ML IJ SOLN
INTRAMUSCULAR | Status: DC | PRN
  Administered 2021-05-21 (×2): 5 mg via INTRAVENOUS

## 2021-05-21 MED ORDER — VANCOMYCIN HCL 1 G IV SOLR
INTRAVENOUS | Status: DC | PRN
  Administered 2021-05-21: 1000 mg

## 2021-05-21 MED ORDER — FENTANYL CITRATE (PF) 100 MCG/2ML IJ SOLN
INTRAMUSCULAR | Status: AC
  Filled 2021-05-21: qty 2

## 2021-05-21 MED ORDER — SUGAMMADEX SODIUM 200 MG/2ML IV SOLN
INTRAVENOUS | Status: DC | PRN
  Administered 2021-05-21: 200 mg via INTRAVENOUS

## 2021-05-21 MED ORDER — PROPOFOL 10 MG/ML IV BOLUS
INTRAVENOUS | Status: AC
  Filled 2021-05-21: qty 40

## 2021-05-21 MED ORDER — SODIUM CHLORIDE 0.9 % IR SOLN
Status: DC | PRN
  Administered 2021-05-21: 300 mL

## 2021-05-21 MED ORDER — DEXMEDETOMIDINE (PRECEDEX) IN NS 20 MCG/5ML (4 MCG/ML) IV SYRINGE
PREFILLED_SYRINGE | INTRAVENOUS | Status: DC | PRN
Start: 2021-05-21 — End: 2021-05-21
  Administered 2021-05-21: 8 ug via INTRAVENOUS

## 2021-05-21 MED ORDER — SODIUM CHLORIDE 0.9 % IV SOLN: INTRAVENOUS | Status: DC | PRN

## 2021-05-21 MED ORDER — PHENYLEPHRINE HCL (PRESSORS) 10 MG/ML IV SOLN
INTRAVENOUS | Status: DC | PRN
  Administered 2021-05-21 (×2): 100 ug via INTRAVENOUS

## 2021-05-21 MED ORDER — ROCURONIUM BROMIDE 100 MG/10ML IV SOLN
INTRAVENOUS | Status: DC | PRN
  Administered 2021-05-21: 20 mg via INTRAVENOUS
  Administered 2021-05-21: 50 mg via INTRAVENOUS
  Administered 2021-05-21 (×2): 10 mg via INTRAVENOUS

## 2021-05-21 MED ORDER — VASOPRESSIN 20 UNIT/ML IV SOLN
INTRAVENOUS | Status: DC | PRN
  Administered 2021-05-21 (×2): 1 [IU] via INTRAVENOUS
  Administered 2021-05-21 (×3): .5 [IU] via INTRAVENOUS

## 2021-05-21 MED ORDER — NEOMYCIN-POLYMYXIN B GU 40-200000 IR SOLN
Status: DC | PRN
  Administered 2021-05-21: 16 mL

## 2021-05-21 MED ORDER — LIDOCAINE HCL (CARDIAC) PF 100 MG/5ML IV SOSY
PREFILLED_SYRINGE | INTRAVENOUS | Status: DC | PRN
  Administered 2021-05-21: 80 mg via INTRAVENOUS

## 2021-05-21 MED ORDER — TRANEXAMIC ACID-NACL 1000-0.7 MG/100ML-% IV SOLN
INTRAVENOUS | Status: DC | PRN
  Administered 2021-05-21 (×2): 1000 mg via INTRAVENOUS

## 2021-05-21 MED ORDER — LACTATED RINGERS IV SOLN: INTRAVENOUS | Status: DC | PRN

## 2021-05-21 MED ORDER — SUGAMMADEX SODIUM 500 MG/5ML IV SOLN
INTRAVENOUS | Status: AC
  Filled 2021-05-21: qty 5

## 2021-05-21 MED ORDER — 0.9 % SODIUM CHLORIDE (POUR BTL) OPTIME
TOPICAL | Status: DC | PRN
  Administered 2021-05-21: 100 mL

## 2021-05-21 MED ORDER — FENTANYL CITRATE (PF) 100 MCG/2ML IJ SOLN
INTRAMUSCULAR | Status: DC | PRN
  Administered 2021-05-21 (×2): 50 ug via INTRAVENOUS

## 2021-05-21 MED ORDER — VASOPRESSIN 20 UNIT/ML IV SOLN
INTRAVENOUS | Status: AC
  Filled 2021-05-21: qty 1

## 2021-05-21 MED ORDER — FENTANYL CITRATE (PF) 100 MCG/2ML IJ SOLN: 25.0000 ug | INTRAMUSCULAR | Status: DC | PRN

## 2021-05-21 MED ORDER — TRANEXAMIC ACID 1000 MG/10ML IV SOLN
INTRAVENOUS | Status: AC
  Filled 2021-05-21: qty 10

## 2021-05-21 MED ORDER — CEFAZOLIN SODIUM-DEXTROSE 2-4 GM/100ML-% IV SOLN
2.0000 g | Freq: Three times a day (TID) | INTRAVENOUS | Status: AC
  Administered 2021-05-21 – 2021-05-22 (×3): 2 g via INTRAVENOUS
  Filled 2021-05-21 (×3): qty 100

## 2021-05-21 MED ORDER — PROPOFOL 10 MG/ML IV BOLUS
INTRAVENOUS | Status: DC | PRN
  Administered 2021-05-21: 120 mg via INTRAVENOUS

## 2021-05-21 MED ORDER — MIDAZOLAM HCL 2 MG/2ML IJ SOLN
INTRAMUSCULAR | Status: DC | PRN
  Administered 2021-05-21: 1 mg via INTRAVENOUS

## 2021-05-21 MED ORDER — BUPIVACAINE-EPINEPHRINE (PF) 0.5% -1:200000 IJ SOLN
INTRAMUSCULAR | Status: DC | PRN
  Administered 2021-05-21: 30 mL

## 2021-05-21 MED ORDER — CEFAZOLIN SODIUM-DEXTROSE 2-4 GM/100ML-% IV SOLN
INTRAVENOUS | Status: AC
  Filled 2021-05-21: qty 100

## 2021-05-21 SURGICAL SUPPLY — 57 items
BLADE SAGITTAL WIDE XTHICK NO (BLADE) ×2 IMPLANT
BNDG COHESIVE 4X5 TAN STRL (GAUZE/BANDAGES/DRESSINGS) ×2 IMPLANT
CHLORAPREP W/TINT 26 (MISCELLANEOUS) ×2 IMPLANT
COVER BACK TABLE REUSABLE LG (DRAPES) ×2 IMPLANT
DRAPE 3/4 80X56 (DRAPES) ×4 IMPLANT
DRAPE INCISE IOBAN 66X60 STRL (DRAPES) ×2 IMPLANT
DRAPE ORTHO SPLIT 77X108 STRL (DRAPES) ×2
DRAPE SURG 17X11 SM STRL (DRAPES) ×2 IMPLANT
DRAPE SURG ORHT 6 SPLT 77X108 (DRAPES) ×2 IMPLANT
DRSG AQUACEL AG ADV 3.5X10 (GAUZE/BANDAGES/DRESSINGS) ×2 IMPLANT
DRSG OPSITE POSTOP 4X10 (GAUZE/BANDAGES/DRESSINGS) IMPLANT
ELECT BLADE 6.5 EXT (BLADE) ×2 IMPLANT
ELECT CAUTERY BLADE 6.4 (BLADE) ×2 IMPLANT
ELECT REM PT RETURN 9FT ADLT (ELECTROSURGICAL) ×2
ELECTRODE REM PT RTRN 9FT ADLT (ELECTROSURGICAL) ×1 IMPLANT
GAUZE 4X4 16PLY ~~LOC~~+RFID DBL (SPONGE) ×2 IMPLANT
GAUZE SPONGE 4X4 12PLY STRL (GAUZE/BANDAGES/DRESSINGS) IMPLANT
GAUZE XEROFORM 1X8 LF (GAUZE/BANDAGES/DRESSINGS) ×2 IMPLANT
GLOVE SURG ENC MOIS LTX SZ7.5 (GLOVE) ×2 IMPLANT
GLOVE SURG UNDER POLY LF SZ7.5 (GLOVE) ×2 IMPLANT
GOWN STRL REUS W/ TWL LRG LVL3 (GOWN DISPOSABLE) ×2 IMPLANT
GOWN STRL REUS W/TWL LRG LVL3 (GOWN DISPOSABLE) ×2
HEAD FEM UNIPOLAR 49 OD STRL (Hips) ×2 IMPLANT
HEMOVAC 400ML (MISCELLANEOUS)
HOOD PEEL AWAY FLYTE STAYCOOL (MISCELLANEOUS) ×4 IMPLANT
IV NS IRRIG 3000ML ARTHROMATIC (IV SOLUTION) ×2 IMPLANT
KIT DRAIN HEMOVAC JP 7FR 400ML (MISCELLANEOUS) IMPLANT
KIT TURNOVER KIT A (KITS) ×2 IMPLANT
MANIFOLD NEPTUNE II (INSTRUMENTS) ×2 IMPLANT
NDL SAFETY ECLIPSE 18X1.5 (NEEDLE) ×1 IMPLANT
NEEDLE FILTER BLUNT 18X 1/2SAF (NEEDLE) ×1
NEEDLE FILTER BLUNT 18X1 1/2 (NEEDLE) ×1 IMPLANT
NEEDLE HYPO 18GX1.5 SHARP (NEEDLE) ×1
NEEDLE MAYO CATGUT SZ4 (NEEDLE) ×2 IMPLANT
NS IRRIG 1000ML POUR BTL (IV SOLUTION) ×2 IMPLANT
PACK HIP PROSTHESIS (MISCELLANEOUS) ×2 IMPLANT
PILLOW ABDUCTION FOAM SM (MISCELLANEOUS) ×2 IMPLANT
PULSAVAC PLUS IRRIG FAN TIP (DISPOSABLE) ×2
RETRIEVER SUT HEWSON (MISCELLANEOUS) IMPLANT
SPACER FEM TAPERED +0 12/14 (Hips) ×2 IMPLANT
SPONGE T-LAP 18X18 ~~LOC~~+RFID (SPONGE) ×8 IMPLANT
STAPLER SKIN PROX 35W (STAPLE) ×2 IMPLANT
STEM FEM CMT STD SZ3 38 (Stem) ×2 IMPLANT
SUT ETHIBOND 2 V 37 (SUTURE) ×2 IMPLANT
SUT QUILL PDO 0 36 36 VIOLET (SUTURE) ×2 IMPLANT
SUT TICRON 2-0 30IN 311381 (SUTURE) ×8 IMPLANT
SUT VIC AB 0 CT1 36 (SUTURE) ×2 IMPLANT
SUT VIC AB 2-0 CT1 27 (SUTURE) ×2
SUT VIC AB 2-0 CT1 TAPERPNT 27 (SUTURE) ×2 IMPLANT
SUT VIC AB 2-0 CT2 27 (SUTURE) ×4 IMPLANT
SYR 10ML LL (SYRINGE) ×2 IMPLANT
TAPE MICROFOAM 4IN (TAPE) IMPLANT
TAPE TRANSPORE STRL 2 31045 (GAUZE/BANDAGES/DRESSINGS) IMPLANT
TIP BRUSH PULSAVAC PLUS 24.33 (MISCELLANEOUS) ×2 IMPLANT
TIP FAN IRRIG PULSAVAC PLUS (DISPOSABLE) ×1 IMPLANT
TRAY FOLEY SLVR 16FR LF STAT (SET/KITS/TRAYS/PACK) ×2 IMPLANT
TUBE SUCT KAM VAC (TUBING) IMPLANT

## 2021-05-21 NOTE — Transfer of Care (Cosign Needed)
Immediate Anesthesia Transfer of Care Note  Patient: Breanna Petty  Procedure(s) Performed: ARTHROPLASTY BIPOLAR HIP (HEMIARTHROPLASTY) (Right: Hip)  Patient Location: PACU  Anesthesia Type:General  Level of Consciousness: awake  Airway & Oxygen Therapy: Patient Spontanous Breathing and Patient connected to nasal cannula oxygen  Post-op Assessment: Report given to RN and Post -op Vital signs reviewed and stable  Post vital signs: Reviewed and stable  Last Vitals:  Vitals Value Taken Time  BP 131/63 05/21/21 1730  Temp    Pulse 80 05/21/21 1736  Resp 18 05/21/21 1736  SpO2 100 % 05/21/21 1736  Vitals shown include unvalidated device data.  Last Pain:  Vitals:   05/21/21 1423  TempSrc: Oral  PainSc: 0-No pain      Patients Stated Pain Goal: 0 (65/68/12 7517)  Complications: No notable events documented.

## 2021-05-21 NOTE — Anesthesia Preprocedure Evaluation (Signed)
Anesthesia Evaluation  Patient identified by MRN, date of birth, ID band Patient awake    Reviewed: Allergy & Precautions, NPO status , Patient's Chart, lab work & pertinent test results  History of Anesthesia Complications Negative for: history of anesthetic complications  Airway Mallampati: III  TM Distance: >3 FB Neck ROM: full    Dental  (+) Chipped, Missing   Pulmonary COPD, former smoker,    Pulmonary exam normal        Cardiovascular Exercise Tolerance: Good hypertension, (-) angina+ CAD and + Past MI  (-) DOE Normal cardiovascular exam+ dysrhythmias      Neuro/Psych PSYCHIATRIC DISORDERS negative neurological ROS     GI/Hepatic negative GI ROS, Neg liver ROS, hiatal hernia, GERD  Medicated and Controlled,  Endo/Other  diabetes, Type 2  Renal/GU Renal disease     Musculoskeletal  (+) Arthritis ,   Abdominal   Peds  Hematology negative hematology ROS (+)   Anesthesia Other Findings Past Medical History: 10/02/2016: Acute diverticulitis No date: Anginal pain ([place]) No date: Anxiety No date: Aortic atherosclerosis (HCC) No date: Arthritis No date: Atherosclerotic heart disease No date: Depression No date: Diabetes mellitus without complication (HCC) No date: Diverticulosis of colon No date: Dysrhythmia No date: GERD (gastroesophageal reflux disease) No date: Hiatal hernia No date: History of heart artery stent No date: History of shingles No date: HOH (hard of hearing) No date: Hyperlipidemia No date: Hypertension No date: Myocardial infarction ([place]) No date: Sarcoidosis No date: Vitamin D deficiency  Past Surgical History: 1979: ABDOMINAL HYSTERECTOMY No date: APPENDECTOMY No date: CARDIAC SURGERY 10/28/2017: CATARACT EXTRACTION W/PHACO; Right     Comment:  Procedure: CATARACT EXTRACTION PHACO AND INTRAOCULAR               LENS PLACEMENT ([facility]);  Surgeon: Eulogio Bear, MD;                 Location: [facility] ORS;  Service: Ophthalmology;  Laterality:              Right;  Lot # [id number] H [place]: 00:53.9 AP%: 10.6 CDE: 5.71 No date: CORONARY ANGIOPLASTY     Comment:  STENT 10/04/2016: LAPAROTOMY; N/A     Comment:  Procedure: EXPLORATORY LAPAROTOMY;  Surgeon: Dia Crawford               III, MD;  Location: [facility] ORS;  Service: General;                Laterality: N/A;  BMI    Body Mass Index: 22.60 kg/m      Reproductive/Obstetrics negative OB ROS                             Anesthesia Physical Anesthesia Plan  ASA: 3  Anesthesia Plan: General ETT   Post-op Pain Management:    Induction: Intravenous  PONV Risk Score and Plan: Ondansetron, Dexamethasone, Midazolam and Treatment may vary due to age or medical condition  Airway Management Planned: Oral ETT  Additional Equipment:   Intra-op Plan:   Post-operative Plan: Extubation in OR  Informed Consent: I have reviewed the patients History and Physical, chart, labs and discussed the procedure including the risks, benefits and alternatives for the proposed anesthesia with the patient or authorized representative who has indicated his/her understanding and acceptance.     Dental Advisory Given  Plan Discussed with: Anesthesiologist, CRNA and Surgeon  Anesthesia Plan Comments: (Patient consented for risks of anesthesia  including but not limited to:  - adverse reactions to medications - damage to eyes, teeth, lips or other oral mucosa - nerve damage due to positioning  - sore throat or hoarseness - Damage to heart, brain, nerves, lungs, other parts of body or loss of life  Patient voiced understanding.)        Anesthesia Quick Evaluation

## 2021-05-21 NOTE — Op Note (Signed)
05/21/2021  5:12 PM  PATIENT:  Breanna Petty   MRN: [id number]  PRE-OPERATIVE DIAGNOSIS:  displaced right femoral neck fracture  POST-OPERATIVE DIAGNOSIS:  displaced right femoral neck fracture  PROCEDURE:  Procedure(s): ARTHROPLASTY BIPOLAR HIP (HEMIARTHROPLASTY)  PREOPERATIVE INDICATIONS:    Breanna Petty is an 80 y.o. female who was admitted with a diagnosis of displaced right femoral neck fracture.  I have recommended surgical fixation with hemiarthroplasty for this injury. I have explained the surgery and the postoperative course to the patient who has agreed with surgical management of this fracture.    The risks benefits and alternatives were discussed with the patient and their family including but not limited to the risks of  infection requiring removal of the prosthesis, bleeding requiring blood transfusion, nerve injury especially to the sciatic nerve leading to foot drop or lower extremity numbness, periprosthetic fracture, dislocation leg length discrepancy, change in lower extremity rotation persistent hip pain, loosening or failure of the components and the need for revision surgery. Medical risks include but are not limited to DVT and pulmonary embolism, myocardial infarction, stroke, pneumonia, respiratory failure and death.  OPERATIVE REPORT     SURGEON:  Dyane Dustman, MD      ANESTHESIA: General    COMPLICATIONS:  None.     COMPONENTS: DePuy [place] size 2 press-fit stem, standard neck, 49 mm head    PROCEDURE IN DETAIL:   The patient was met in the holding area and  identified.  The appropriate hip was identified and marked at the operative site after verbally confirming with the patient that this was the correct site of surgery.  The patient was then transported to the OR  and  underwent general anesthesia.  The patient was then placed in the lateral decubitus position with the operative side up and secured on the operating room table with a pegboard and all  bony prominences were adequately padded. This included an axillary roll and additional padding around the nonoperative leg to prevent compression to the common peroneal nerve.    The operative lower extremity was prepped and draped in a sterile fashion.  A time out was performed prior to incision to verify patient's name, date of birth, medical record number, correct site of surgery correct procedure to be performed. The timeout was also used to verify the patient received antibiotics now appropriate instruments, implants and radiographic studies were available in the room. Once all in attendance were in agreement case began.    A posterolateral approach was utilized via sharp dissection  carried down to the subcutaneous tissue.  Bleeding vessels were coagulated using electrocautery.  The fascia lata was identified and incised along the length of the skin incision.  The gluteus maximus muscle was then split in line with its fibers. Self-retaining retractors were  inserted.  With the hip internally rotated, the short external rotators  were identified and removed from the posterior attachment from the greater trochanter. The capsule was identified and a T-shaped capsulotomy was performed. The capsule was tagged with #2 Tycron for later repair.  The femoral neck fracture was exposed, and the femoral head was removed using a corkscrew device. This was measured to be 49 mm in diameter. The attention was then turned to proximal femur preparation.  An oscillating saw was used to perform a proximal femoral osteotomy 1 fingerbreadth above the lesser trochanter. The trial 49 mm femoral head was placed into the acetabulum and had an excellent suction fit. The attention was then  turned back to femoral preparation.    A femoral skid and Cobra retractor were placed under the femoral neck to allow for adequate visualization. A box osteotome was used to make the initial entry into the proximal femur. A single hand reamer  was used to prepare the femoral canal. A T-shaped femoral canal sounder was then used to ensure no penetration femoral cortex had occurred during reaming. The proximal femur was then sequentially broached by hand. A size 2 femoral trial broach was found to have best medial to lateral canal fit. Once adequate mediolateral canal fill was achieved the trial femoral broach, neck, and head was assembled and the hip was reduced. It was found to have excellent stability, equivalent leg lengths with functional range of motion. The trial components were then removed.  I copiously irrigated the femoral canal and then impacted the real femoral prosthesis into place into the appropriate version, slightly anteverted to the normal anatomy, and I impacted the actual femoral component into place. The hip was then reduced and taken through functional range of motion and found to have excellent stability. Leg lengths were restored. The hip joint was copiously irrigated.   A soft tissue repair of the capsule and external rotators was performed using #2 Tycron. Excellent posterior capsular repair was achieved. The fascia lata was then closed with interrupted 0 Vicryl suture and an 0 barbed suture. The subcutaneous tissues were closed with 2-0 Vicryl and the skin approximated with staples.   The patient was then placed supine on the operative table. Leg lengths were checked clinically and found to be equivalent. An abduction pillow was placed between the lower extremities. The patient was then transferred to a hospital bed and brought to the PACU in stable condition. I was scrubbed and present the entire case and all sharp and instrument counts were correct at the conclusion of the case.   Postoperative plan: #1.  PT OT: Patient will be weightbearing as tolerated on the right lower extremity.  Posterior hip precautions x3 months. #2. DVT prophylaxis: Patient is to start Lovenox on postop day 1 for 2 weeks #3. AP pelvis x-ray  to be obtained in the PACU #4. dressing to remain in place until follow-up in clinic in 1 saturated #5.  Follow-up in clinic in approximately 2 weeks for x-rays and staple removal   Dyane Dustman, MD Orthopedic Surgeon

## 2021-05-21 NOTE — Progress Notes (Signed)
Initial Nutrition Assessment  DOCUMENTATION CODES:  Not applicable  INTERVENTION:  Advance diet as medically able.  Recommend regular diet once diet advances.  Add Magic cup TID with meals, each supplement provides 290 kcal and 9 grams of protein.  Continue MVI with minerals daily.  NUTRITION DIAGNOSIS:  Increased nutrient needs related to post-op healing, hip fracture as evidenced by estimated needs.  GOAL:  Patient will meet greater than or equal to 90% of their needs  MONITOR:  Diet advancement, PO intake, Supplement acceptance, Labs, Weight trends, I & O's  REASON FOR ASSESSMENT:  Consult Hip fracture protocol  ASSESSMENT:  80 yo female with a PMH of HTN, HLD, T2DM, COPD not on oxygen, GERD, depression, anxiety, CAD s/p stent placement, hard of hearing, diverticulitis, sarcoidosis, and CKD stage 3a who presents with R hip fracture.  Spoke with pt at bedside. Pt reports eating very well at home except for the last 2 days because of pain. Pt endorses an appetite currently as she is hungry while NPO waiting for surgery.  Pt reports no changes in her weight recently. Per Epic, pt's weight has remained stable for the past 2 years.  On exam, pt with no significant depletions.  Recommend adding Magic Cup TID and continuing MVI with minerals for increased needs.  Medications: reviewed; SSI, bedtime Novolog, MVI with minerals, Protonix, morphine PRN (given once today)  Labs: reviewed; 133-234 HbA1c: 6.7% (12/2018)  NUTRITION - FOCUSED PHYSICAL EXAM: Flowsheet Row Most Recent Value  Orbital Region No depletion  Upper Arm Region No depletion  Thoracic and Lumbar Region No depletion  Buccal Region No depletion  Temple Region No depletion  Clavicle Bone Region Mild depletion  Clavicle and Acromion Bone Region Mild depletion  Scapular Bone Region No depletion  Dorsal Hand No depletion  Patellar Region No depletion  Anterior Thigh Region No depletion  Posterior Calf Region  No depletion  Edema (RD Assessment) None  Hair Reviewed  Eyes Reviewed  Mouth Reviewed  Skin Reviewed  Nails Reviewed   Diet Order:   Diet Order             Diet NPO time specified  Diet effective midnight                  EDUCATION NEEDS:  Education needs have been addressed  Skin:  Skin Assessment: Reviewed RN Assessment  Last BM:  05/19/21  Height:  Ht Readings from Last 1 Encounters:  05/20/21 5\' 11"  (1.803 m)   Weight:  Wt Readings from Last 1 Encounters:  05/20/21 73.5 kg   Ideal Body Weight:  70.5 kg  BMI:  Body mass index is 22.59 kg/m.  Estimated Nutritional Needs:  Kcal:  1850-2050 Protein:  85-100 grams Fluid:  >1.85 L  Derrel Nip, RD, LDN (she/her/hers) Registered Dietitian I After-Hours/Weekend Pager # in [phone]

## 2021-05-21 NOTE — Progress Notes (Signed)
PROGRESS NOTE    Breanna Petty  YYT:[id number] DOB: 1941/02/07 DOA: 05/20/2021 PCP: Donnamarie Rossetti, PA-C    Brief Narrative:  Breanna Petty is a 80 y.o. female with medical history significant of hypertension, hyperlipidemia, diabetes mellitus, COPD not on oxygen, GERD, depression, anxiety, CAD with stent placement, hard of hearing, diverticulitis, sarcoidosis, small bowel obstruction, CKD stage IIIa, who presents with fall and right hip pain.     Pt states that she fell down 2 steps accidentally when she tripped her steps and lost balance at home. No LOC.  Denies head or neck injury.  Developed right hip pain, which is constant, sharp, 10 out of 10 severity, nonradiating.  No leg numbness or weakness.  Patient denies any chest pain, cough, shortness breath.  No fever or chills.  Denies nausea vomiting, diarrhea or abdominal pain.  No symptoms of UTI.      Consultants:  orthopedics  Procedures:   Antimicrobials:      Subjective: No complaints. Son at bedside. No sob or cp  Objective: Vitals:   05/20/21 1945 05/20/21 2333 05/21/21 0359 05/21/21 0737  BP: (!) 141/65 (!) 135/58 (!) 142/83 (!) 172/69  Pulse: 77 74 74 71  Resp: 16 16 16 18   Temp: 98.4 F (36.9 C) 98.5 F (36.9 C) 98.2 F (36.8 C) 97.8 F (36.6 C)  TempSrc: Oral Oral    SpO2: 97% 95% 99% 100%  Weight:      Height:        Intake/Output Summary (Last 24 hours) at 05/21/2021 0825 Last data filed at 05/21/2021 0359 Gross per 24 hour  Intake 23.15 ml  Output 950 ml  Net -926.85 ml   Filed Weights   05/20/21 1001  Weight: 73.5 kg    Examination:  General exam: Appears calm and comfortable  Respiratory system: Clear to auscultation. Respiratory effort normal. Cardiovascular system: S1 & S2 heard, RRR. No JVD, murmurs, rubs, gallops or clicks.  Gastrointestinal system: Abdomen is nondistended, soft and nontender. No organomegaly or masses felt. Normal bowel sounds heard. Central nervous system:  Alert and oriented.  Grossly intact extremities: No edema Skin: Warm dry Psychiatry: Judgement and insight appear normal. Mood & affect appropriate.     Data Reviewed: I have personally reviewed following labs and imaging studies  CBC: Recent Labs  Lab 05/20/21 1004 05/21/21 0433  WBC 9.4 8.2  NEUTROABS 6.5  --   HGB 11.6* 11.8*  HCT 34.7* 35.1*  MCV 93.8 91.4  PLT 192 275   Basic Metabolic Panel: Recent Labs  Lab 05/20/21 1004  NA 135  K 4.1  CL 100  CO2 28  GLUCOSE 128*  BUN 18  CREATININE 1.05*  CALCIUM 9.6   GFR: Estimated Creatinine Clearance: 48.6 mL/min (A) (by C-G formula based on SCr of 1.05 mg/dL (H)). Liver Function Tests: Recent Labs  Lab 05/20/21 1004  AST 26  ALT 16  ALKPHOS 55  BILITOT 0.9  PROT 7.0  ALBUMIN 4.1   No results for input(s): LIPASE, AMYLASE in the last 168 hours. No results for input(s): AMMONIA in the last 168 hours. Coagulation Profile: Recent Labs  Lab 05/20/21 1004  INR 1.0   Cardiac Enzymes: No results for input(s): CKTOTAL, CKMB, CKMBINDEX, TROPONINI in the last 168 hours. BNP (last 3 results) No results for input(s): PROBNP in the last 8760 hours. HbA1C: No results for input(s): HGBA1C in the last 72 hours. CBG: Recent Labs  Lab 05/20/21 1200 05/20/21 1715 05/20/21 2308 05/21/21 1700  GLUCAP 101* 185* 234* 133*   Lipid Profile: No results for input(s): CHOL, HDL, LDLCALC, TRIG, CHOLHDL, LDLDIRECT in the last 72 hours. Thyroid Function Tests: No results for input(s): TSH, T4TOTAL, FREET4, T3FREE, THYROIDAB in the last 72 hours. Anemia Panel: No results for input(s): VITAMINB12, FOLATE, FERRITIN, TIBC, IRON, RETICCTPCT in the last 72 hours. Sepsis Labs: No results for input(s): PROCALCITON, LATICACIDVEN in the last 168 hours.  Recent Results (from the past 240 hour(s))  SARS CORONAVIRUS 2 (TAT 6-24 HRS) Nasopharyngeal Nasopharyngeal Swab     Status: None   Collection Time: 05/17/21 12:56 PM   Specimen:  Nasopharyngeal Swab  Result Value Ref Range Status   SARS Coronavirus 2 NEGATIVE NEGATIVE Final    Comment: (NOTE) SARS-CoV-2 target nucleic acids are NOT DETECTED.  The SARS-CoV-2 RNA is generally detectable in upper and lower respiratory specimens during the acute phase of infection. Negative results do not preclude SARS-CoV-2 infection, do not rule out co-infections with other pathogens, and should not be used as the sole basis for treatment or other patient management decisions. Negative results must be combined with clinical observations, patient history, and epidemiological information. The expected result is Negative.  Fact Sheet for Patients: SugarRoll.be  Fact Sheet for Healthcare Providers: https://www.woods-mathews.com/  This test is not yet approved or cleared by the [place] FDA and  has been authorized for detection and/or diagnosis of SARS-CoV-2 by FDA under an Emergency Use Authorization (EUA). This EUA will remain  in effect (meaning this test can be used) for the duration of the COVID-19 declaration under Se ction 564(b)(1) of the Act, 21 U.S.C. section 360bbb-3(b)(1), unless the authorization is terminated or revoked sooner.  Performed at [facility], [place] [address] [place], [place] [address]   Resp Panel by RT-PCR (Flu A&B, Covid) Nasopharyngeal Swab     Status: None   Collection Time: 05/20/21 10:31 AM   Specimen: Nasopharyngeal Swab; Nasopharyngeal(NP) swabs in vial transport medium  Result Value Ref Range Status   SARS Coronavirus 2 by RT PCR NEGATIVE NEGATIVE Final    Comment: (NOTE) SARS-CoV-2 target nucleic acids are NOT DETECTED.  The SARS-CoV-2 RNA is generally detectable in upper respiratory specimens during the acute phase of infection. The lowest concentration of SARS-CoV-2 viral copies this assay can detect is 138 copies/mL. A negative result does not preclude SARS-Cov-2 infection and  should not be used as the sole basis for treatment or other patient management decisions. A negative result may occur with  improper specimen collection/handling, submission of specimen other than nasopharyngeal swab, presence of viral mutation(s) within the areas targeted by this assay, and inadequate number of viral copies(<138 copies/mL). A negative result must be combined with clinical observations, patient history, and epidemiological information. The expected result is Negative.  Fact Sheet for Patients:  EntrepreneurPulse.com.au  Fact Sheet for Healthcare Providers:  IncredibleEmployment.be  This test is no t yet approved or cleared by the [place] FDA and  has been authorized for detection and/or diagnosis of SARS-CoV-2 by FDA under an Emergency Use Authorization (EUA). This EUA will remain  in effect (meaning this test can be used) for the duration of the COVID-19 declaration under Section 564(b)(1) of the Act, 21 U.S.C.section 360bbb-3(b)(1), unless the authorization is terminated  or revoked sooner.       Influenza A by PCR NEGATIVE NEGATIVE Final   Influenza B by PCR NEGATIVE NEGATIVE Final    Comment: (NOTE) The Xpert Xpress SARS-CoV-2/FLU/RSV plus assay is intended as an aid  in the diagnosis of influenza from Nasopharyngeal swab specimens and should not be used as a sole basis for treatment. Nasal washings and aspirates are unacceptable for Xpert Xpress SARS-CoV-2/FLU/RSV testing.  Fact Sheet for Patients: EntrepreneurPulse.com.au  Fact Sheet for Healthcare Providers: IncredibleEmployment.be  This test is not yet approved or cleared by the [place] FDA and has been authorized for detection and/or diagnosis of SARS-CoV-2 by FDA under an Emergency Use Authorization (EUA). This EUA will remain in effect (meaning this test can be used) for the duration of the COVID-19 declaration  under Section 564(b)(1) of the Act, 21 U.S.C. section 360bbb-3(b)(1), unless the authorization is terminated or revoked.  Performed at [facility], [address], [place], [place] [address]   Surgical PCR screen     Status: None   Collection Time: 05/20/21  4:52 PM   Specimen: Nasal Mucosa; Nasal Swab  Result Value Ref Range Status   MRSA, PCR NEGATIVE NEGATIVE Final   Staphylococcus aureus NEGATIVE NEGATIVE Final    Comment: (NOTE) The Xpert SA Assay (FDA approved for NASAL specimens in patients 93 years of age and older), is one component of a comprehensive surveillance program. It is not intended to diagnose infection nor to guide or monitor treatment. Performed at [facility], [address] [place], [place] [address]          Radiology Studies: DG Chest 1 View  Result Date: 05/20/2021 CLINICAL DATA:  Fall. EXAM: CHEST  1 VIEW COMPARISON:  01/23/2020.  11/28/2018. FINDINGS: Surgical clip noted over the mid chest. Mediastinum and hilar structures normal. Cardiomegaly. No pulmonary venous congestion. No focal infiltrate. Mild left base subsegmental atelectasis and or scarring. No pleural effusion or pneumothorax. Questionable soft tissue calcification over the left neck of questionable etiology. Calcifications noted the spleen suggesting calcified granulomas. IMPRESSION: 1.  Cardiomegaly.  No pulmonary venous congestion. 2. Mild left base subsegmental atelectasis consistent scarring. No acute pulmonary disease. 3. Degenerative change thoracic spine. No acute bony abnormality. No pneumothorax. Electronically Signed   By: Marcello Moores  Register   On: 05/20/2021 11:18   CT HEAD WO CONTRAST  Result Date: 05/20/2021 CLINICAL DATA:  Head trauma, minor. Additional history provided: Fall. EXAM: CT HEAD WITHOUT CONTRAST TECHNIQUE: Contiguous axial images were obtained from the base of the skull through the vertex without intravenous contrast. COMPARISON:  Prior head CT  examinations 12/26/2020 and earlier FINDINGS: Brain: Mild generalized cerebral atrophy. Mild patchy and ill-defined hypoattenuation within the cerebral white matter, nonspecific but compatible with chronic small vessel ischemic disease. There is no acute intracranial hemorrhage. No demarcated cortical infarct. No extra-axial fluid collection. No evidence of an intracranial mass. No midline shift. Vascular: No hyperdense vessel.  Atherosclerotic calcifications. Skull: Normal. Negative for fracture or focal lesion. Sinuses/Orbits: Visualized orbits show no acute finding. Trace right maxillary sinus mucosal thickening at the imaged levels. IMPRESSION: No evidence of acute intracranial abnormality. Mild cerebral atrophy and chronic small vessel ischemic disease, stable as compared to the head CT of 12/26/2020. Electronically Signed   By: Kellie Simmering DO   On: 05/20/2021 13:40   DG Hip Unilat  With Pelvis 2-3 Views Right  Result Date: 05/20/2021 CLINICAL DATA:  Fall. EXAM: DG HIP (WITH OR WITHOUT PELVIS) 2-3V RIGHT COMPARISON:  CT 10/25/2020. FINDINGS: Angulated fracture of the right femoral neck. No evidence of dislocation however the cross-table lateral view is difficult to evaluate due to technique. Degenerative changes lumbar spine and both hips. Aortoiliac atherosclerotic vascular calcification. Pelvic calcifications consistent phleboliths.  Catheter noted over the perineum. IMPRESSION: Angulated fracture of the right femoral neck. Electronically Signed   By: Marcello Moores  Register   On: 05/20/2021 11:15        Scheduled Meds:  amLODipine  5 mg Oral Daily   [START ON 05/22/2021] aspirin EC  81 mg Oral Daily   insulin aspart  0-5 Units Subcutaneous QHS   insulin aspart  0-9 Units Subcutaneous TID WC   latanoprost  1 drop Both Eyes QHS   losartan  50 mg Oral Daily   mirabegron ER  50 mg Oral Daily   multivitamin with minerals  1 tablet Oral Daily   mupirocin ointment  1 application Nasal BID   pantoprazole   40 mg Oral BID   pravastatin  80 mg Oral Daily   Continuous Infusions:   ceFAZolin (ANCEF) IV      Assessment & Plan:   Principal Problem:   Closed right hip fracture (HCC) Active Problems:   Anxiety and depression   Dyslipidemia   Gastro-esophageal reflux disease without esophagitis   Chronic obstructive pulmonary disease (HCC)   CAD (coronary artery disease)   HTN (hypertension)   Type II diabetes mellitus with renal manifestations (HCC)   CKD (chronic kidney disease), stage IIIa   Fall at home, initial encounter   Closed right hip fracture ([place]):  As evidenced by x-ray. Patient has moderate pain now. No neurovascular compromise.  7 /6 -orthopedics consulted plan to go to the OR today  Pain control  PT/INR when cleared by orthopedics  Bowel regimen postsurgery     Anxiety and depression Continue Ativan   Dyslipidemia  continue pravastatin   Gastro-esophageal reflux disease without esophagitis Continue Protonix   Chronic obstructive pulmonary disease ([place]): Stable -Bronchodilators as needed   CAD (coronary artery disease): S/p of stent placement the patient does not have any chest pain or shortness breath. -As needed nitroglycerin -Lipitor -Continue aspirin, but will skip tomorrow for surgery, and then restart on 7/7   HTN (hypertension) -IV hydralazine as needed -Amlodipine, Cozaar,   Type II diabetes mellitus with renal manifestations ([place]): Recent A1c 6.7, well controlled.  Patient taking Januvia at home. -Sliding scale insulin   CKD (chronic kidney disease), stage IIIa: Close to baseline.  Baseline creatinine 0.96-1.0 recently.  Her creatinine is 1.05, BUN 18. -Follow-up with BMP   Fall at home, initial encounter: -Follow-up CT head -->negative -pt/ot when able to   DVT prophylaxis: SCD Code Status: Full Family Communication: Son at bedside Disposition Plan:  Status is: Inpatient  Remains inpatient appropriate because:Inpatient level of care  appropriate due to severity of illness  Dispo: The patient is from: Home              Anticipated d/c is to:  To be determined              Patient currently is not medically stable to d/c.   Difficult to place patient No            LOS: 1 day   Time spent: 35 minutes with more than 50% on [place], MD [facility] Pager [phone]  If 7PM-7AM, please contact night-coverage 05/21/2021, 8:25 AM

## 2021-05-21 NOTE — Consult Note (Signed)
ORTHOPAEDIC CONSULTATION  REQUESTING PHYSICIAN: Nolberto Hanlon, MD  Chief Complaint: Right hip pain  HPI: Breanna Petty is a 80 y.o. female who complains of right hip pain after a mechanical fall at home.  X-rays in the ER showed presence of a displaced femoral neck fracture. The pain is sharp in character.The pain is worse with movement and better with rest. Denies any numbness, tingling or constitutional symptoms.  The patient lives at home alone and does not use any assistive devices for ambulation.  She does not take any blood thinners.  Past medical history significant for hypertension, hyperlipidemia, diabetes mellitus, COPD not on oxygen, GERD, depression, anxiety, CAD with stent placement, hard of hearing, diverticulitis, sarcoidosis, small bowel obstruction, CKD stage IIIa   Past Medical History:  Diagnosis Date   Acute diverticulitis 10/02/2016   Anginal pain (HCC)    Anxiety    Aortic atherosclerosis ([place])    Arthritis    Atherosclerotic heart disease    Depression    Diabetes mellitus without complication ([place])    Diverticulosis of colon    Dysrhythmia    GERD (gastroesophageal reflux disease)    Hiatal hernia    History of heart artery stent    History of shingles    HOH (hard of hearing)    Hyperlipidemia    Hypertension    Myocardial infarction ([place])    Sarcoidosis    Vitamin D deficiency    Past Surgical History:  Procedure Laterality Date   ABDOMINAL HYSTERECTOMY  1979   APPENDECTOMY     CARDIAC SURGERY     CATARACT EXTRACTION W/PHACO Right 10/28/2017   Procedure: CATARACT EXTRACTION PHACO AND INTRAOCULAR LENS PLACEMENT ([place]);  Surgeon: Eulogio Bear, MD;  Location: [facility] ORS;  Service: Ophthalmology;  Laterality: Right;  Lot # [id number] H [place]: 00:53.9 AP%: 10.6 CDE: 5.71   CORONARY ANGIOPLASTY     STENT   LAPAROTOMY N/A 10/04/2016   Procedure: EXPLORATORY LAPAROTOMY;  Surgeon: Dia  III, MD;  Location: [facility] ORS;  Service: General;  Laterality:  N/A;   Social History   Socioeconomic History   Marital status: Single    Spouse name: Not on file   Number of children: 5   Years of education: Not on file   Highest education level: Not on file  Occupational History   Not on file  Tobacco Use   Smoking status: Former    Packs/day: 2.00    Years: 10.00    Pack years: 20.00    Types: Cigarettes    Quit date: 11/16/1988    Years since quitting: 32.5   Smokeless tobacco: Never  Vaping Use   Vaping Use: Never used  Substance and Sexual Activity   Alcohol use: No    Alcohol/week: 0.0 standard drinks   Drug use: No   Sexual activity: Not Currently  Other Topics Concern   Not on file  Social History Narrative   Not on file   Social Determinants of Health   Financial Resource Strain: Not on file  Food Insecurity: Not on file  Transportation Needs: Not on file  Physical Activity: Not on file  Stress: Not on file  Social Connections: Not on file   Family History  Problem Relation Age of Onset   Diabetes Daughter    Hypertension Mother    Other Father        MVA   Diabetes Son    Breast cancer Neg Hx    Allergies  Allergen Reactions   Ace  Inhibitors     Other reaction(s): Cough   Codeine Swelling   Codeine Sulfate     Other reaction(s): Unknown   Ezetimibe     Other reaction(s): Muscle Pain   Other     Other reaction(s): Muscle Pain   Prior to Admission medications   Medication Sig Start Date End Date Taking? Authorizing Provider  amLODipine (NORVASC) 5 MG tablet Take 1 tablet (5 mg total) by mouth daily. 12/23/18  Yes Juline Patch, MD  aspirin EC 81 MG tablet Take 1 tablet (81 mg total) by mouth daily. 12/29/16  Yes Juline Patch, MD  ipratropium (ATROVENT) 0.06 % nasal spray Place 2 sprays into both nostrils 4 (four) times daily as needed for rhinitis. 07/06/20  Yes Cook, Jayce G, DO  latanoprost (XALATAN) 0.005 % ophthalmic solution PLACE 1 DROP INTO BOTH EYES AT BEDTIME 08/26/20 08/26/21 Yes Eulogio Bear, MD  losartan (COZAAR) 50 MG tablet Take 1 tablet (50 mg total) by mouth daily. 12/23/18  Yes Juline Patch, MD  Multiple Vitamin (MULTIVITAMIN) capsule Take 1 capsule by mouth daily.   Yes [provider]  pravastatin (PRAVACHOL) 80 MG tablet Take 1 tablet (80 mg total) by mouth nightly 03/20/21  Yes   sitaGLIPtin (JANUVIA) 100 MG tablet TAKE 1 TABLET (100 MG TOTAL) BY MOUTH DAILY. 07/10/19  Yes Hubbard Hartshorn, FNP  glucose blood (FREESTYLE LITE) test strip USE AS INSTRUCTED 11/13/19   Hubbard Hartshorn, FNP  Lancets (FREESTYLE) lancets TEST 2 TIMES A DAY 12/23/20 12/23/21  Whitaker, Corene Cornea Hestle, PA-C  meloxicam (MOBIC) 15 MG tablet Take 1 tablet (15 mg total) by mouth once daily for 10 days Patient not taking: No sig reported 04/22/21     methocarbamol (ROBAXIN) 500 MG tablet Take 1 tablet (500 mg total) by mouth 2 (two) times daily as needed for up to 10 days Patient not taking: No sig reported 02/26/21     mirabegron ER (MYRBETRIQ) 50 MG TB24 tablet Take 1 tablet (50 mg total) by mouth daily. 04/07/21   MacDiarmid, Nicki Reaper, MD  NITROSTAT 0.4 MG SL tablet DISSOLVE ONE TABLET UNDER TONGUE AS NEED FOR CHEST PAIN AS DIRECTED Patient taking differently: Place 0.4 mg under the tongue every 5 (five) minutes as needed. 11/28/15   Steele Sizer, MD  ondansetron (ZOFRAN ODT) 4 MG disintegrating tablet Take 1 tablet (4 mg total) by mouth every 8 (eight) hours as needed for nausea or vomiting. Patient not taking: No sig reported 05/17/21   Coral Spikes, DO  pantoprazole (PROTONIX) 40 MG tablet TAKE 1 TABLET BY MOUTH 2 TIMES DAILY BEFORE MEALS 10/31/20 10/31/21  Whitaker, Corene Cornea Hestle, PA-C  sitaGLIPtin (JANUVIA) 100 MG tablet TAKE 1 TABLET (100 MG TOTAL) BY MOUTH ONCE DAILY 03/19/21 03/19/22     DG Chest 1 View  Result Date: 05/20/2021 CLINICAL DATA:  Fall. EXAM: CHEST  1 VIEW COMPARISON:  01/23/2020.  11/28/2018. FINDINGS: Surgical clip noted over the mid chest. Mediastinum and hilar structures normal.  Cardiomegaly. No pulmonary venous congestion. No focal infiltrate. Mild left base subsegmental atelectasis and or scarring. No pleural effusion or pneumothorax. Questionable soft tissue calcification over the left neck of questionable etiology. Calcifications noted the spleen suggesting calcified granulomas. IMPRESSION: 1.  Cardiomegaly.  No pulmonary venous congestion. 2. Mild left base subsegmental atelectasis consistent scarring. No acute pulmonary disease. 3. Degenerative change thoracic spine. No acute bony abnormality. No pneumothorax. Electronically Signed   By: Marcello Moores  Register   On: 05/20/2021 11:18  CT HEAD WO CONTRAST  Result Date: 05/20/2021 CLINICAL DATA:  Head trauma, minor. Additional history provided: Fall. EXAM: CT HEAD WITHOUT CONTRAST TECHNIQUE: Contiguous axial images were obtained from the base of the skull through the vertex without intravenous contrast. COMPARISON:  Prior head CT examinations 12/26/2020 and earlier FINDINGS: Brain: Mild generalized cerebral atrophy. Mild patchy and ill-defined hypoattenuation within the cerebral white matter, nonspecific but compatible with chronic small vessel ischemic disease. There is no acute intracranial hemorrhage. No demarcated cortical infarct. No extra-axial fluid collection. No evidence of an intracranial mass. No midline shift. Vascular: No hyperdense vessel.  Atherosclerotic calcifications. Skull: Normal. Negative for fracture or focal lesion. Sinuses/Orbits: Visualized orbits show no acute finding. Trace right maxillary sinus mucosal thickening at the imaged levels. IMPRESSION: No evidence of acute intracranial abnormality. Mild cerebral atrophy and chronic small vessel ischemic disease, stable as compared to the head CT of 12/26/2020. Electronically Signed   By: Kellie Simmering DO   On: 05/20/2021 13:40   DG Hip Unilat  With Pelvis 2-3 Views Right  Result Date: 05/20/2021 CLINICAL DATA:  Fall. EXAM: DG HIP (WITH OR WITHOUT PELVIS) 2-3V RIGHT  COMPARISON:  CT 10/25/2020. FINDINGS: Angulated fracture of the right femoral neck. No evidence of dislocation however the cross-table lateral view is difficult to evaluate due to technique. Degenerative changes lumbar spine and both hips. Aortoiliac atherosclerotic vascular calcification. Pelvic calcifications consistent phleboliths. Catheter noted over the perineum. IMPRESSION: Angulated fracture of the right femoral neck. Electronically Signed   By: Marcello Moores  Register   On: 05/20/2021 11:15    Positive ROS: All other systems have been reviewed and were otherwise negative with the exception of those mentioned in the HPI and as above.  Physical Exam: General: Alert, no acute distress Cardiovascular: No pedal edema Respiratory: No cyanosis, no use of accessory musculature GI: No organomegaly, abdomen is soft and non-tender Skin: No lesions in the area of chief complaint Neurologic: Sensation intact distally Psychiatric: Patient is competent for consent with normal mood and affect Lymphatic: No axillary or cervical lymphadenopathy  MUSCULOSKELETAL: Right lower extremity: Diffuse tenderness mild swelling about the hip, no tenderness about the knee or ankle.  Able to actively flex and extend the toes and ankle.  Right lower extremity is grossly motor and sensory intact  Assessment: 80 year old female admitted with a displaced right femoral neck fracture  Plan: We had a long discussion regarding risks and benefits of both operative and nonoperative treatment options as well as expected postoperative recovery.  Recommendation was made for right hip hemiarthroplasty.  Risks of surgery discussed include but not limited to bleeding, infection, neurovascular damage, fracture, reoperation, dislocation, blood clot, etc.  We also discussed importance of maintaining appropriate blood glucose levels perioperatively to reduce risk of infection.  Following discussion, patient expressed her wishes to proceed with  surgery.  Appreciate hospitalist team support, please keep n.p.o., we will plan for surgery this afternoon.    Renee Harder, MD    05/21/2021 7:49 AM

## 2021-05-21 NOTE — Anesthesia Procedure Notes (Cosign Needed)
Procedure Name: Intubation Date/Time: 05/21/2021 3:13 PM Performed by: Andria Frames, MD Pre-anesthesia Checklist: Patient identified, Emergency Drugs available, Suction available and Patient being monitored Patient Re-evaluated:Patient Re-evaluated prior to induction Oxygen Delivery Method: Circle system utilized Preoxygenation: Pre-oxygenation with 100% oxygen Induction Type: IV induction Ventilation: Mask ventilation without difficulty Laryngoscope Size: McGraph and 3 Grade View: Grade I Tube type: Oral Tube size: 6.5 mm Number of attempts: 1 Airway Equipment and Method: Stylet and Oral airway Placement Confirmation: ETT inserted through vocal cords under direct vision, positive ETCO2 and breath sounds checked- equal and bilateral Secured at: 22 cm Tube secured with: Tape Dental Injury: Teeth and Oropharynx as per pre-operative assessment

## 2021-05-22 ENCOUNTER — Encounter: Payer: Self-pay | Admitting: Orthopaedic Surgery

## 2021-05-22 DIAGNOSIS — S72001A Fracture of unspecified part of neck of right femur, initial encounter for closed fracture: Secondary | ICD-10-CM | POA: Diagnosis not present

## 2021-05-22 DIAGNOSIS — I2510 Atherosclerotic heart disease of native coronary artery without angina pectoris: Secondary | ICD-10-CM | POA: Diagnosis not present

## 2021-05-22 DIAGNOSIS — N1831 Chronic kidney disease, stage 3a: Secondary | ICD-10-CM | POA: Diagnosis not present

## 2021-05-22 DIAGNOSIS — F419 Anxiety disorder, unspecified: Secondary | ICD-10-CM | POA: Diagnosis not present

## 2021-05-22 LAB — GLUCOSE, CAPILLARY
Glucose-Capillary: 219 mg/dL — ABNORMAL HIGH (ref 70–99)
Glucose-Capillary: 186 mg/dL — ABNORMAL HIGH (ref 70–99)
Glucose-Capillary: 185 mg/dL — ABNORMAL HIGH (ref 70–99)
Glucose-Capillary: 186 mg/dL — ABNORMAL HIGH (ref 70–99)

## 2021-05-22 MED ORDER — ENOXAPARIN SODIUM 40 MG/0.4ML IJ SOSY
40.0000 mg | PREFILLED_SYRINGE | INTRAMUSCULAR | Status: DC
  Administered 2021-05-22 – 2021-05-28 (×7): 40 mg via SUBCUTANEOUS
  Filled 2021-05-22 (×7): qty 0.4

## 2021-05-22 NOTE — NC FL2 (Signed)
[place] LEVEL OF CARE SCREENING TOOL     IDENTIFICATION  Patient Name: Breanna Petty Birthdate: 08/15/1941 Sex: female Admission Date (Current Location): 05/20/2021  [facility] and [place] Number:  Engineering geologist and Address:  [facility], [address] [place], [place] [address]      Provider Number: [id number]  Attending Physician Name and Address:  Nolberto Hanlon, MD  Relative Name and Phone Number:  Jori Moll SOn [phone]    Current Level of Care: Hospital Recommended Level of Care: [place] Prior Approval Number:    Date Approved/Denied:   PASRR Number: [id number]  Discharge Plan: SNF    Current Diagnoses: Patient Active Problem List   Diagnosis Date Noted   Closed right hip fracture ([place]) 05/20/2021   CAD (coronary artery disease) 05/20/2021   HTN (hypertension) 05/20/2021   Type II diabetes mellitus with renal manifestations ([place]) 05/20/2021   CKD (chronic kidney disease), stage IIIa 05/20/2021   Fall at home, initial encounter 05/20/2021   Status post exploratory laparotomy 10/21/2016   Myocardial infarction ([place]) 10/20/2016   SBO (small bowel obstruction) ([place])    Aortic atherosclerosis ([place]) 10/02/2016   Diverticulosis of large intestine without hemorrhage 10/02/2016   Hammer toe of right foot 10/16/2015   Nail deformity 10/16/2015   Chronic obstructive pulmonary disease ([place]) 09/04/2015   History of MI (myocardial infarction) 09/04/2015   Spondylolisthesis at L4-L5 level 07/11/2015   DDD (degenerative disc disease), lumbar 07/11/2015   History of sarcoidosis 06/04/2015   Urge incontinence of urine 06/04/2015   Hearing loss of both ears 05/07/2015   Anxiety and depression 05/04/2015   Arteriosclerosis of coronary artery 05/04/2015   Type 2 diabetes mellitus with microalbuminuria, without long-term current use of insulin ([place]) 05/04/2015   Dyslipidemia 05/04/2015   Hypertension, benign  05/04/2015   Gastro-esophageal reflux disease without esophagitis 05/04/2015   Hiatal hernia 05/04/2015   HZV (herpes zoster virus) post herpetic neuralgia 05/04/2015   Vitamin D deficiency 05/04/2015   S/P coronary artery stent placement 05/04/2015    Orientation RESPIRATION BLADDER Height & Weight     Self, Time, Situation, Place  Normal External catheter Weight: 73.5 kg Height:  5\' 11"  (180.3 cm)  BEHAVIORAL SYMPTOMS/MOOD NEUROLOGICAL BOWEL NUTRITION STATUS      Continent Diet (regular)  AMBULATORY STATUS COMMUNICATION OF NEEDS Skin   Extensive Assist Verbally Surgical wounds                       Personal Care Assistance Level of Assistance  Bathing, Feeding, Dressing Bathing Assistance: Limited assistance Feeding assistance: Independent Dressing Assistance: Limited assistance     Functional Limitations Info             SPECIAL CARE FACTORS FREQUENCY  OT (By licensed OT), PT (By licensed PT)     PT Frequency: 5 times per week OT Frequency: 3 times per week            Contractures Contractures Info: Not present    Additional Factors Info  Code Status, Allergies Code Status Info: full code Allergies Info: Ace Inhibitors, Codeine, Codeine Sulfate, Ezetimibe,           Current Medications (05/22/2021):  This is the current hospital active medication list Current Facility-Administered Medications  Medication Dose Route Frequency Provider Last Rate Last Admin   0.9 %  sodium chloride infusion   Intravenous PRN Nolberto Hanlon, MD 10 mL/hr at 05/22/21 0602 Infusion Verify at 05/22/21  0602   acetaminophen (TYLENOL) tablet 650 mg  650 mg Oral Q6H PRN Renee Harder, MD       albuterol (VENTOLIN HFA) 108 (90 Base) MCG/ACT inhaler 2 puff  2 puff Inhalation Q4H PRN Renee Harder, MD       amLODipine (NORVASC) tablet 5 mg  5 mg Oral Daily Renee Harder, MD   5 mg at 05/22/21 1610   aspirin EC tablet 81 mg  81 mg Oral Daily Renee Harder, MD   81 mg  at 05/22/21 9604   ceFAZolin (ANCEF) IVPB 2g/100 mL premix  2 g Intravenous Essie Hart, MD   Stopped at 05/22/21 0555   dextromethorphan-guaiFENesin ([place] DM) 30-600 MG per 12 hr tablet 1 tablet  1 tablet Oral BID PRN Renee Harder, MD       hydrALAZINE (APRESOLINE) injection 5 mg  5 mg Intravenous Q2H PRN Renee Harder, MD   5 mg at 05/21/21 1002   insulin aspart (novoLOG) injection 0-5 Units  0-5 Units Subcutaneous Dewain Penning, MD   2 Units at 05/21/21 2150   insulin aspart (novoLOG) injection 0-9 Units  0-9 Units Subcutaneous TID WC Renee Harder, MD   3 Units at 05/22/21 0801   ipratropium (ATROVENT) 0.06 % nasal spray 2 spray  2 spray Each Nare QID PRN Renee Harder, MD       latanoprost (XALATAN) 0.005 % ophthalmic solution 1 drop  1 drop Both Eyes QHS Renee Harder, MD   1 drop at 05/21/21 2137   LORazepam (ATIVAN) injection 0.5 mg  0.5 mg Intravenous Q8H PRN Renee Harder, MD       losartan (COZAAR) tablet 50 mg  50 mg Oral Daily Renee Harder, MD   50 mg at 05/22/21 0959   mirabegron ER (MYRBETRIQ) tablet 50 mg  50 mg Oral Daily Renee Harder, MD   50 mg at 05/22/21 5409   morphine 2 MG/ML injection 1 mg  1 mg Intravenous Q4H PRN Renee Harder, MD   1 mg at 05/21/21 1302   multivitamin with minerals tablet 1 tablet  1 tablet Oral Daily Renee Harder, MD   1 tablet at 05/22/21 8119   mupirocin ointment (BACTROBAN) 2 % 1 application  1 application Nasal BID Renee Harder, MD   1 application at 14/78/29 0959   nitroGLYCERIN (NITROSTAT) SL tablet 0.4 mg  0.4 mg Sublingual Q5 min PRN Renee Harder, MD       ondansetron [facility]) injection 4 mg  4 mg Intravenous Q8H PRN Renee Harder, MD   4 mg at 05/20/21 2016   oxyCODONE-acetaminophen (PERCOCET/ROXICET) 5-325 MG per tablet 1 tablet  1 tablet Oral Q4H PRN Renee Harder, MD   1 tablet at 05/22/21 0753   pantoprazole (PROTONIX) EC tablet 40 mg  40 mg Oral BID Renee Harder, MD   40 mg at 05/22/21 5621   pravastatin (PRAVACHOL) tablet 80 mg  80 mg Oral Daily Renee Harder, MD   80 mg at 05/22/21 3086   senna-docusate (Senokot-S) tablet 1 tablet  1 tablet Oral QHS PRN Renee Harder, MD         Discharge Medications: Please see discharge summary for a list of discharge medications.  Relevant Imaging Results:  Relevant Lab Results:   Additional Information SS# [id number]  Su Hilt, RN

## 2021-05-22 NOTE — Progress Notes (Signed)
Physical Therapy Treatment Patient Details Name: Breanna Petty MRN: [id number] DOB: 1940-11-29 Today's Date: 05/22/2021    History of Present Illness Pt is a 80yo F admitted to [facility]/20/21 for mechanical fall that resulted in closed R hip fx. Pt s/p R hemiarthroplasty by Dr. Sharlet Salina on 05/21/21; pt is WBAT on RLE with posterior hip precautions for 7mo. Significant PMH includes: HTN, HLD, DM, COPD, GERD, depression, anxiety, CAD with stent placement, HOH, diverticulitis, sarcoidosis, SBO, CKD (stage IIIa).    PT Comments    Pt declining OOB mobility this afternoon due to significant fatigue, but was agreeable for supine therex. Pt required max assist for RLE SAQ, SLR, heel slides, and hip ABD/ADD due to generalized weakness and pain. Decreased quad mm activation noted bil (R>L) with SAQ and quad sets. Pt continues to present with slight confusion, requiring increased multimodal cues for attention to task, sequencing, and counting repetitions. Pt able to recall 1/3 posterior hip precautions and was unable to recall RLE WB status. Pt agreeable for OOB mobility and transfer/gait to recliner tomorrow. Pt will continue to benefit from skilled acute PT services to address deficits for return to baseline function. Will continue to recommend SNF at DC.     Follow Up Recommendations  SNF;Supervision for mobility/OOB     Equipment Recommendations  Rolling walker with 5" wheels;3in1 (PT)    Recommendations for Other Services       Precautions / Restrictions Precautions Precautions: Fall;Posterior Hip Precaution Booklet Issued: Yes (comment) Restrictions Weight Bearing Restrictions: No    Mobility  Bed Mobility General bed mobility comments: pt declining OOB mobility due to significant fatigue    Transfers General transfer comment: pt declining OOB mobility due to significant fatigue  Ambulation/Gait  General Gait Details: pt declining OOB mobility due to significant fatigue      Balance Not assessed - pt declining OOB mobility due to significant fatigue        Cognition Arousal/Alertness: Awake/alert Behavior During Therapy: WFL for tasks assessed/performed Overall Cognitive Status: Impaired/Different from baseline Area of Impairment: Attention;Memory     General Comments: Required multiple cues during therex for attention to task; difficulty counting to 15 that improved with repetition      Exercises Total Joint Exercises Ankle Circles/Pumps: AROM;Strengthening;Both;15 reps Quad Sets: AROM;Strengthening;Both;15 reps Gluteal Sets: AROM;Strengthening;Both;15 reps Towel Squeeze: AROM;Strengthening;Both;15 reps Short Arc Quad: AROM;Left;AAROM;Right;Strengthening;15 reps Heel Slides: AROM;Left;AAROM;Right;Strengthening;15 reps Hip ABduction/ADduction: AROM;Left;AAROM;Right;Strengthening;15 reps Straight Leg Raises: AAROM;Both;Strengthening;15 reps Other Exercises Other Exercises: Pt able to perform x15 BLE therex including: ankle pumps, quad sets, glute sets, heel slides, SLR, SAQ, hip ABD/ADD, and isometric ADD. Required max assist on RLE for heel slides, SLR, SAQ, and hip ABD/ADD due to weakness. Max cueing for glute and quad sets. Other Exercises: Pt educated regarding: PT role/POC, DC recommendations, posterior hip precautions, RLE WBAT precautions, safety with mobility, and benefits of OOB mobility.        Pertinent Vitals/Pain Pain Assessment: 0-10 Pain Score: 5  Faces Pain Scale: Hurts even more Pain Location: R hip with mobility Pain Intervention(s): Limited activity within patient's tolerance;Monitored during session;Repositioned    Home Living Family/patient expects to be discharged to:: Private residence Living Arrangements: Alone Available Help at Discharge: Family;Available PRN/intermittently Type of Home: House Home Access: Stairs to enter Entrance Stairs-Rails: Can reach both Home Layout: One level Home Equipment: None      Prior  Function Level of Independence: Independent      Comments: Pt reports being Ind with all ADL's, IADL's,  community ambulation without AD, driving, and 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   PT Goals (current goals can now be found in the care plan section) Acute Rehab PT Goals Patient Stated Goal: to get better PT Goal Formulation: With patient Time For Goal Achievement: 06/05/21 Potential to Achieve Goals: Good Progress towards PT goals: Progressing toward goals    Frequency    BID      PT Plan Current plan remains appropriate       AM-PAC PT "6 Clicks" Mobility   Outcome Measure  Help needed turning from your back to your side while in a flat bed without using bedrails?: A Little Help needed moving from lying on your back to sitting on the side of a flat bed without using bedrails?: A Little Help needed moving to and from a bed to a chair (including a wheelchair)?: A Lot Help needed standing up from a chair using your arms (e.g., wheelchair or bedside chair)?: A Lot Help needed to walk in hospital room?: A Lot Help needed climbing 3-5 steps with a railing? : Total 6 Click Score: 13    End of Session Equipment Utilized During Treatment: Gait belt Activity Tolerance: Patient limited by lethargy;Patient limited by pain Patient left: in bed;with bed alarm set (hip ABD pillow donned) Nurse Communication: Mobility status PT Visit Diagnosis: Unsteadiness on feet (R26.81);Muscle weakness (generalized) (M62.81);History of falling (Z91.81);Pain Pain - Right/Left: Right Pain - part of body: Hip     Time: 1400-1431 PT Time Calculation (min) (ACUTE ONLY): 31 min  Charges:  $Therapeutic Exercise: 23-37 mins $Therapeutic Activity: 8-22 mins                       Herminio Commons, PT, DPT 2:48 PM,05/22/21

## 2021-05-22 NOTE — Anesthesia Postprocedure Evaluation (Signed)
Anesthesia Post Note  Patient: Breanna Petty  Procedure(s) Performed: ARTHROPLASTY BIPOLAR HIP (HEMIARTHROPLASTY) (Right: Hip)  Patient location during evaluation: PACU Anesthesia Type: General Level of consciousness: awake and alert Pain management: pain level controlled Vital Signs Assessment: post-procedure vital signs reviewed and stable Respiratory status: spontaneous breathing, nonlabored ventilation, respiratory function stable and patient connected to nasal cannula oxygen Cardiovascular status: blood pressure returned to baseline and stable Postop Assessment: no apparent nausea or vomiting Anesthetic complications: no   No notable events documented.   Last Vitals:  Vitals:   05/21/21 1821 05/22/21 0526  BP: (!) 141/62 (!) 151/61  Pulse: 76 91  Resp: 15 18  Temp: 36.6 C   SpO2: 100% 98%    Last Pain:  Vitals:   05/21/21 2141  TempSrc:   PainSc: 3                  Precious Haws Georgeann Brinkman

## 2021-05-22 NOTE — Progress Notes (Signed)
PROGRESS NOTE    Breanna Petty  IAX:[id number] DOB: [birth year] DOA: 05/20/2021 PCP: Donnamarie Rossetti, PA-C    Brief Narrative:  Breanna Petty is a 80 y.o. female with medical history significant of hypertension, hyperlipidemia, diabetes mellitus, COPD not on oxygen, GERD, depression, anxiety, CAD with stent placement, hard of hearing, diverticulitis, sarcoidosis, small bowel obstruction, CKD stage IIIa, who presents with fall and right hip pain.     Pt states that she fell down 2 steps accidentally when she tripped her steps and lost balance at home. No LOC.  Denies head or neck injury.  Developed right hip pain, which is constant, sharp, 10 out of 10 severity, nonradiating.  No leg numbness or weakness.  Patient denies any chest pain, cough, shortness breath.  No fever or chills.  Denies nausea vomiting, diarrhea or abdominal pain.  No symptoms of UTI.   7/7 status post hemiarthroplasty of right hip on 7/6 by Dr. Sharlet Salina.  Pain controlled.  Patient has no complaints   Consultants:  orthopedics  Procedures:   Antimicrobials:      Subjective: Denies shortness of breath or chest pain  Objective: Vitals:   05/21/21 1800 05/21/21 1821 05/22/21 0526 05/22/21 0738  BP: (!) 139/55 (!) 141/62 (!) 151/61 (!) 151/63  Pulse: 74 76 91 88  Resp: (!) 21 15 18 17   Temp:  97.9 F (36.6 C)  99 F (37.2 C)  TempSrc:  Oral    SpO2: 100% 100% 98% 98%  Weight:      Height:        Intake/Output Summary (Last 24 hours) at 05/22/2021 0786 Last data filed at 05/22/2021 0602 Gross per 24 hour  Intake 923.4 ml  Output 1300 ml  Net -376.6 ml   Filed Weights   05/20/21 1001 05/21/21 1423  Weight: 73.5 kg 73.5 kg    Examination:  NAD, calm eating breakfast CTA no wheeze rales rhonchi's Regular S1-S2 no gallops Soft benign positive bowel sounds No edema Awake alert and oriented x4 Mood and affect appropriate in current setting   Data Reviewed: I have personally reviewed  following labs and imaging studies  CBC: Recent Labs  Lab 05/20/21 1004 05/21/21 0433  WBC 9.4 8.2  NEUTROABS 6.5  --   HGB 11.6* 11.8*  HCT 34.7* 35.1*  MCV 93.8 91.4  PLT 192 754   Basic Metabolic Panel: Recent Labs  Lab 05/20/21 1004  NA 135  K 4.1  CL 100  CO2 28  GLUCOSE 128*  BUN 18  CREATININE 1.05*  CALCIUM 9.6   GFR: Estimated Creatinine Clearance: 48.6 mL/min (A) (by C-G formula based on SCr of 1.05 mg/dL (H)). Liver Function Tests: Recent Labs  Lab 05/20/21 1004  AST 26  ALT 16  ALKPHOS 55  BILITOT 0.9  PROT 7.0  ALBUMIN 4.1   No results for input(s): LIPASE, AMYLASE in the last 168 hours. No results for input(s): AMMONIA in the last 168 hours. Coagulation Profile: Recent Labs  Lab 05/20/21 1004  INR 1.0   Cardiac Enzymes: No results for input(s): CKTOTAL, CKMB, CKMBINDEX, TROPONINI in the last 168 hours. BNP (last 3 results) No results for input(s): PROBNP in the last 8760 hours. HbA1C: No results for input(s): HGBA1C in the last 72 hours. CBG: Recent Labs  Lab 05/21/21 1140 05/21/21 1425 05/21/21 1729 05/21/21 2116 05/22/21 0757  GLUCAP 126* 119* 143* 211* 219*   Lipid Profile: No results for input(s): CHOL, HDL, LDLCALC, TRIG, CHOLHDL, LDLDIRECT in the last  72 hours. Thyroid Function Tests: No results for input(s): TSH, T4TOTAL, FREET4, T3FREE, THYROIDAB in the last 72 hours. Anemia Panel: No results for input(s): VITAMINB12, FOLATE, FERRITIN, TIBC, IRON, RETICCTPCT in the last 72 hours. Sepsis Labs: No results for input(s): PROCALCITON, LATICACIDVEN in the last 168 hours.  Recent Results (from the past 240 hour(s))  SARS CORONAVIRUS 2 (TAT 6-24 HRS) Nasopharyngeal Nasopharyngeal Swab     Status: None   Collection Time: 05/17/21 12:56 PM   Specimen: Nasopharyngeal Swab  Result Value Ref Range Status   SARS Coronavirus 2 NEGATIVE NEGATIVE Final    Comment: (NOTE) SARS-CoV-2 target nucleic acids are NOT DETECTED.  The  SARS-CoV-2 RNA is generally detectable in upper and lower respiratory specimens during the acute phase of infection. Negative results do not preclude SARS-CoV-2 infection, do not rule out co-infections with other pathogens, and should not be used as the sole basis for treatment or other patient management decisions. Negative results must be combined with clinical observations, patient history, and epidemiological information. The expected result is Negative.  Fact Sheet for Patients: SugarRoll.be  Fact Sheet for Healthcare Providers: https://www.woods-mathews.com/  This test is not yet approved or cleared by the [place] FDA and  has been authorized for detection and/or diagnosis of SARS-CoV-2 by FDA under an Emergency Use Authorization (EUA). This EUA will remain  in effect (meaning this test can be used) for the duration of the COVID-19 declaration under Se ction 564(b)(1) of the Act, 21 U.S.C. section 360bbb-3(b)(1), unless the authorization is terminated or revoked sooner.  Performed at [facility], [place] [address] [place],  [address]   Resp Panel by RT-PCR (Flu A&B, Covid) Nasopharyngeal Swab     Status: None   Collection Time: 05/20/21 10:31 AM   Specimen: Nasopharyngeal Swab; Nasopharyngeal(NP) swabs in vial transport medium  Result Value Ref Range Status   SARS Coronavirus 2 by RT PCR NEGATIVE NEGATIVE Final    Comment: (NOTE) SARS-CoV-2 target nucleic acids are NOT DETECTED.  The SARS-CoV-2 RNA is generally detectable in upper respiratory specimens during the acute phase of infection. The lowest concentration of SARS-CoV-2 viral copies this assay can detect is 138 copies/mL. A negative result does not preclude SARS-Cov-2 infection and should not be used as the sole basis for treatment or other patient management decisions. A negative result may occur with  improper specimen collection/handling, submission of  specimen other than nasopharyngeal swab, presence of viral mutation(s) within the areas targeted by this assay, and inadequate number of viral copies(<138 copies/mL). A negative result must be combined with clinical observations, patient history, and epidemiological information. The expected result is Negative.  Fact Sheet for Patients:  EntrepreneurPulse.com.au  Fact Sheet for Healthcare Providers:  IncredibleEmployment.be  This test is no t yet approved or cleared by the [place] FDA and  has been authorized for detection and/or diagnosis of SARS-CoV-2 by FDA under an Emergency Use Authorization (EUA). This EUA will remain  in effect (meaning this test can be used) for the duration of the COVID-19 declaration under Section 564(b)(1) of the Act, 21 U.S.C.section 360bbb-3(b)(1), unless the authorization is terminated  or revoked sooner.       Influenza A by PCR NEGATIVE NEGATIVE Final   Influenza B by PCR NEGATIVE NEGATIVE Final    Comment: (NOTE) The Xpert Xpress SARS-CoV-2/FLU/RSV plus assay is intended as an aid in the diagnosis of influenza from Nasopharyngeal swab specimens and should not be used as a sole basis for treatment. Nasal washings  and aspirates are unacceptable for Xpert Xpress SARS-CoV-2/FLU/RSV testing.  Fact Sheet for Patients: EntrepreneurPulse.com.au  Fact Sheet for Healthcare Providers: IncredibleEmployment.be  This test is not yet approved or cleared by the [place] FDA and has been authorized for detection and/or diagnosis of SARS-CoV-2 by FDA under an Emergency Use Authorization (EUA). This EUA will remain in effect (meaning this test can be used) for the duration of the COVID-19 declaration under Section 564(b)(1) of the Act, 21 U.S.C. section 360bbb-3(b)(1), unless the authorization is terminated or revoked.  Performed at [facility], [address], [place], [place] [address]   Surgical PCR screen     Status: None   Collection Time: 05/20/21  4:52 PM   Specimen: Nasal Mucosa; Nasal Swab  Result Value Ref Range Status   MRSA, PCR NEGATIVE NEGATIVE Final   Staphylococcus aureus NEGATIVE NEGATIVE Final    Comment: (NOTE) The Xpert SA Assay (FDA approved for NASAL specimens in patients 79 years of age and older), is one component of a comprehensive surveillance program. It is not intended to diagnose infection nor to guide or monitor treatment. Performed at [facility], [address] [place],  [address]          Radiology Studies: DG Chest 1 View  Result Date: 05/20/2021 CLINICAL DATA:  Fall. EXAM: CHEST  1 VIEW COMPARISON:  01/23/2020.  11/28/2018. FINDINGS: Surgical clip noted over the mid chest. Mediastinum and hilar structures normal. Cardiomegaly. No pulmonary venous congestion. No focal infiltrate. Mild left base subsegmental atelectasis and or scarring. No pleural effusion or pneumothorax. Questionable soft tissue calcification over the left neck of questionable etiology. Calcifications noted the spleen suggesting calcified granulomas. IMPRESSION: 1.  Cardiomegaly.  No pulmonary venous congestion. 2. Mild left base subsegmental atelectasis consistent scarring. No acute pulmonary disease. 3. Degenerative change thoracic spine. No acute bony abnormality. No pneumothorax. Electronically Signed   By: Marcello Moores  Register   On: 05/20/2021 11:18   CT HEAD WO CONTRAST  Result Date: 05/20/2021 CLINICAL DATA:  Head trauma, minor. Additional history provided: Fall. EXAM: CT HEAD WITHOUT CONTRAST TECHNIQUE: Contiguous axial images were obtained from the base of the skull through the vertex without intravenous contrast. COMPARISON:  Prior head CT examinations 12/26/2020 and earlier FINDINGS: Brain: Mild generalized cerebral atrophy. Mild patchy and ill-defined hypoattenuation within the cerebral white matter, nonspecific but  compatible with chronic small vessel ischemic disease. There is no acute intracranial hemorrhage. No demarcated cortical infarct. No extra-axial fluid collection. No evidence of an intracranial mass. No midline shift. Vascular: No hyperdense vessel.  Atherosclerotic calcifications. Skull: Normal. Negative for fracture or focal lesion. Sinuses/Orbits: Visualized orbits show no acute finding. Trace right maxillary sinus mucosal thickening at the imaged levels. IMPRESSION: No evidence of acute intracranial abnormality. Mild cerebral atrophy and chronic small vessel ischemic disease, stable as compared to the head CT of 12/26/2020. Electronically Signed   By: Kellie Simmering DO   On: 05/20/2021 13:40   DG Pelvis Portable  Result Date: 05/21/2021 CLINICAL DATA:  Postop right hip bipolar hemiarthroplasty. EXAM: PORTABLE PELVIS 1-2 VIEWS COMPARISON:  Radiographs 09/23/2018. FINDINGS: 1749 hours. AP view of the pelvis demonstrates interval right hip bipolar hemiarthroplasty. The hardware is well positioned. No evidence of acute fracture or dislocation. There is a small amount of gas in the right hip joint and surrounding soft tissues. Skin staples are in place. Mild lower lumbar spondylosis demonstrated. IMPRESSION: No evidence of acute complication following right hip bipolar hemiarthroplasty. Electronically Signed  By: Richardean Sale M.D.   On: 05/21/2021 18:23   DG Hip Unilat  With Pelvis 2-3 Views Right  Result Date: 05/20/2021 CLINICAL DATA:  Fall. EXAM: DG HIP (WITH OR WITHOUT PELVIS) 2-3V RIGHT COMPARISON:  CT 10/25/2020. FINDINGS: Angulated fracture of the right femoral neck. No evidence of dislocation however the cross-table lateral view is difficult to evaluate due to technique. Degenerative changes lumbar spine and both hips. Aortoiliac atherosclerotic vascular calcification. Pelvic calcifications consistent phleboliths. Catheter noted over the perineum. IMPRESSION: Angulated fracture of the right femoral  neck. Electronically Signed   By: Marcello Moores  Register   On: 05/20/2021 11:15        Scheduled Meds:  amLODipine  5 mg Oral Daily   aspirin EC  81 mg Oral Daily   insulin aspart  0-5 Units Subcutaneous QHS   insulin aspart  0-9 Units Subcutaneous TID WC   latanoprost  1 drop Both Eyes QHS   losartan  50 mg Oral Daily   mirabegron ER  50 mg Oral Daily   multivitamin with minerals  1 tablet Oral Daily   mupirocin ointment  1 application Nasal BID   pantoprazole  40 mg Oral BID   pravastatin  80 mg Oral Daily   Continuous Infusions:  sodium chloride 10 mL/hr at 05/22/21 0602    ceFAZolin (ANCEF) IV Stopped (05/22/21 0555)    Assessment & Plan:   Principal Problem:   Closed right hip fracture (HCC) Active Problems:   Anxiety and depression   Dyslipidemia   Gastro-esophageal reflux disease without esophagitis   Chronic obstructive pulmonary disease (HCC)   CAD (coronary artery disease)   HTN (hypertension)   Type II diabetes mellitus with renal manifestations ([place])   CKD (chronic kidney disease), stage IIIa   Fall at home, initial encounter   Closed right hip fracture ([place]):  As evidenced by x-ray. Patient has moderate pain now. No neurovascular compromise.  7/7 Ortho following  Status post hemiarthroplasty on 7/6 by Dr. Sharlet Salina  PT OT when cleared by orthopedics  Bowel regimen       Anxiety and depression Continue Ativan   Dyslipidemia Continue pravastatin   Gastro-esophageal reflux disease without esophagitis Continue Protonix   Chronic obstructive pulmonary disease ([place]): Stable -Bronchodilators as needed   CAD (coronary artery disease): S/p of stent placement the patient does not have any chest pain or shortness breath. -As needed nitroglycerin -Lipitor -Continue aspirin, but will skip tomorrow for surgery, and then restart on 7/7 if ok by surgery   HTN (hypertension) -IV hydralazine as needed -Amlodipine, Cozaar,   Type II diabetes mellitus with  renal manifestations ([place]): Recent A1c 6.7, well controlled.  Patient taking Januvia at home. -Sliding scale insulin   CKD (chronic kidney disease), stage IIIa: Close to baseline.  Baseline creatinine 0.96-1.0 recently.  Her creatinine is 1.05, BUN 18. -Follow-up with BMP   Fall at home, initial encounter: -Follow-up CT head -->negative -pt/ot when able to   DVT prophylaxis: SCD Code Status: Full Family Communication: none at bedside Disposition Plan:  Status is: Inpatient  Remains inpatient appropriate because:Inpatient level of care appropriate due to severity of illness  Dispo: The patient is from: Home              Anticipated d/c is to:  To be determined              Patient currently is not medically stable to d/c.   Difficult to place patient No  LOS: 2 days   Time spent: 35 minutes with more than 50% on [place], MD [facility] Pager [phone]  If 7PM-7AM, please contact night-coverage 05/22/2021, 8:08 AM

## 2021-05-22 NOTE — Evaluation (Signed)
Physical Therapy Evaluation Patient Details Name: Breanna Petty MRN: [id number] DOB: [birth year] Today's Date: 05/22/2021   History of Present Illness  Pt is a 80yo F admitted to [facility] on 05/20/21 for mechanical fall that resulted in closed R hip fx. Pt s/p R hemiarthroplasty by Dr. Sharlet Salina on 05/21/21; pt is WBAT on RLE with posterior hip precautions for 29mo. Significant PMH includes: HTN, HLD, DM, COPD, GERD, depression, anxiety, CAD with stent placement, HOH, diverticulitis, sarcoidosis, SBO, CKD (stage IIIa).   Clinical Impression  Pt is a 80 year old F admitted to hospital on 05/20/21 for mechanical fall that resulted in R hip fx. At baseline, pt is independent with all ADL's, IADL's, community ambulation without AD, driving, and working part-time. Pt presents with LE weakness, increased pain, decreased RLE AROM secondary to acuity of sx, mild confusion, decreased activity tolerance, and decreased standing balance resulting in impaired functional mobility from baseline. Due to deficits, pt required mod-max assist for transfers and mod assist to take a few steps at bedside with RW; pt seated EOB upon entry, therefore bed mobility not assessed. Further mobility limited secondary to increased assist levels, poor standing balance, fatigue, and pain. Deficits limit the pt's ability to safely and independently perform ADL's, transfer, and ambulate. Pt will benefit from acute skilled PT services to address deficits for return to baseline function. At this time, PT recommends SNF at DC to address deficits and improve overall safety with functional mobility prior to return home.     Follow Up Recommendations SNF;Supervision for mobility/OOB    Equipment Recommendations  Rolling walker with 5" wheels;3in1 (PT)    Recommendations for Other Services       Precautions / Restrictions Precautions Precautions: Fall;Posterior Hip Precaution Booklet Issued: No Restrictions Weight Bearing Restrictions: No       Mobility  Bed Mobility               General bed mobility comments: Pt seated at EOB upon entry    Transfers Overall transfer level: Needs assistance Equipment used: Rolling walker (2 wheeled) Transfers: Sit to/from Stand Sit to Stand: Max assist;From elevated surface         General transfer comment: Max assist for power to stand from elevated bed height with RW. Increased time/effort to achieve full upright standing with RLE outside BOS. Cueing for sequencing, hand placement, and safety.  Ambulation/Gait Ambulation/Gait assistance: Mod assist Gait Distance (Feet): 2 Feet Assistive device: Rolling walker (2 wheeled)       General Gait Details: Mod assist to take 3 small lateral steps at bedside with RW. Pt demonstrates decreased step length L>R, decreased foot clearance bil, slowed cadence, decreased RW negotiation, and wide BOS. Pt required increased assist for lateral weight shift and LE facilitation for stepping. Limited secondary to pain/fatigue.    Balance Overall balance assessment: Needs assistance Sitting-balance support: Bilateral upper extremity supported;Feet supported Sitting balance-Leahy Scale: Good     Standing balance support: Bilateral upper extremity supported;During functional activity Standing balance-Leahy Scale: Poor Standing balance comment: Required mod assist for standing in RW                             Pertinent Vitals/Pain Pain Assessment: Faces Faces Pain Scale: Hurts even more Pain Location: R hip with mobility; reports 0/10 pain at rest Pain Intervention(s): Limited activity within patient's tolerance;Monitored during session;Premedicated before session;Repositioned    Home Living Family/patient expects to be discharged to:: Private  residence Living Arrangements: Alone Available Help at Discharge: Family;Available PRN/intermittently (daughter lives next door and can provide 24/7 supervision) Type of Home:  House Home Access: Stairs to enter Entrance Stairs-Rails: Can reach both Entrance Stairs-Number of Steps: front: 4 STE with bil railing, back: 3 STE without railing Home Layout: One level Home Equipment: None      Prior Function Level of Independence: Independent         Comments: Pt reports being Ind with all ADL's, IADL's, community ambulation without AD, driving, and 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     Extremity/Trunk Assessment   Upper Extremity Assessment Upper Extremity Assessment: Defer to OT evaluation    Lower Extremity Assessment Lower Extremity Assessment: Generalized weakness (unable to formally assess RLE due to acuity of sx and posterior hip precautions; LLE at 3+/5. Sensation and coordination intact. No N/T)    Cervical / Trunk Assessment Cervical / Trunk Assessment: Normal  Communication   Communication: No difficulties  Cognition Arousal/Alertness: Awake/alert Behavior During Therapy: WFL for tasks assessed/performed Overall Cognitive Status: Impaired/Different from baseline Area of Impairment: Orientation                 Orientation Level: Time             General Comments: A&O x3; states time is "May 23rd, 2023"         Exercises Other Exercises Other Exercises: Pt able to participate in transfers and short distance gait at bedside with RW. Required max-mod assist for mobility secondary to weakness and pain. Further mobility limited secondary to pain/fatigue. Other Exercises: Pt educated regarding: PT role/POC, DC recommendations, posterior hip precautions, RLE WBAT precautions, safety with mobility, and benefits of OOB mobility.   Assessment/Plan    PT Assessment Patient needs continued PT services  PT Problem List Decreased strength;Decreased mobility;Decreased range of motion;Decreased activity tolerance;Decreased balance;Decreased knowledge of precautions;Pain       PT Treatment Interventions DME  instruction;Gait training;Stair training;Functional mobility training;Therapeutic activities;Therapeutic exercise;Balance training;Neuromuscular re-education;Patient/family education    PT Goals (Current goals can be found in the Care Plan section)  Acute Rehab PT Goals Patient Stated Goal: to get better PT Goal Formulation: With patient Time For Goal Achievement: 06/05/21 Potential to Achieve Goals: Good    Frequency BID   Barriers to discharge Decreased caregiver support Daughter available for 24/7 supervision       AM-PAC PT "6 Clicks" Mobility  Outcome Measure Help needed turning from your back to your side while in a flat bed without using bedrails?: A Little Help needed moving from lying on your back to sitting on the side of a flat bed without using bedrails?: A Little Help needed moving to and from a bed to a chair (including a wheelchair)?: A Lot Help needed standing up from a chair using your arms (e.g., wheelchair or bedside chair)?: A Lot Help needed to walk in hospital room?: A Lot Help needed climbing 3-5 steps with a railing? : Total 6 Click Score: 13    End of Session Equipment Utilized During Treatment: Gait belt Activity Tolerance: Patient limited by lethargy;Patient limited by pain Patient left: in bed;with bed alarm set Nurse Communication: Mobility status PT Visit Diagnosis: Unsteadiness on feet (R26.81);Muscle weakness (generalized) (M62.81);History of falling (Z91.81);Pain Pain - Right/Left: Right Pain - part of body: Hip    Time: 1020-1037 PT Time Calculation (min) (ACUTE ONLY): 17 min   Charges:   PT Evaluation $PT Eval Low Complexity: 1 Low PT  Treatments $Therapeutic Activity: 8-22 mins       Herminio Commons, PT, DPT 12:26 PM,05/22/21

## 2021-05-22 NOTE — Progress Notes (Signed)
Subjective:  Patient has no pain. She was able to sit in the chair with PT this morning.  Objective:   VITALS:   Vitals:   05/21/21 1821 05/22/21 0526 05/22/21 0738 05/22/21 1135  BP: (!) 141/62 (!) 151/61 (!) 151/63 (!) 144/52  Pulse: 76 91 88 80  Resp: 15 18 17 19   Temp: 97.9 F (36.6 C)  99 F (37.2 C) 99 F (37.2 C)  TempSrc: Oral     SpO2: 100% 98% 98% 100%  Weight:      Height:        PHYSICAL EXAM:  General: alert, resting comfortably RLE: dressing c/d/I, able to plantarflex/dorsiflex ankle, sensation grossly intact  LABS  Results for orders placed or performed during the hospital encounter of 05/20/21 (from the past 24 hour(s))  Glucose, capillary     Status: Abnormal   Collection Time: 05/21/21  2:25 PM  Result Value Ref Range   Glucose-Capillary 119 (H) 70 - 99 mg/dL  Glucose, capillary     Status: Abnormal   Collection Time: 05/21/21  5:29 PM  Result Value Ref Range   Glucose-Capillary 143 (H) 70 - 99 mg/dL  Glucose, capillary     Status: Abnormal   Collection Time: 05/21/21  9:16 PM  Result Value Ref Range   Glucose-Capillary 211 (H) 70 - 99 mg/dL   Comment 1 Notify RN   Glucose, capillary     Status: Abnormal   Collection Time: 05/22/21  7:57 AM  Result Value Ref Range   Glucose-Capillary 219 (H) 70 - 99 mg/dL  Glucose, capillary     Status: Abnormal   Collection Time: 05/22/21 12:17 PM  Result Value Ref Range   Glucose-Capillary 186 (H) 70 - 99 mg/dL    CT HEAD WO CONTRAST  Result Date: 05/20/2021 CLINICAL DATA:  Head trauma, minor. Additional history provided: Fall. EXAM: CT HEAD WITHOUT CONTRAST TECHNIQUE: Contiguous axial images were obtained from the base of the skull through the vertex without intravenous contrast. COMPARISON:  Prior head CT examinations 12/26/2020 and earlier FINDINGS: Brain: Mild generalized cerebral atrophy. Mild patchy and ill-defined hypoattenuation within the cerebral white matter, nonspecific but compatible with  chronic small vessel ischemic disease. There is no acute intracranial hemorrhage. No demarcated cortical infarct. No extra-axial fluid collection. No evidence of an intracranial mass. No midline shift. Vascular: No hyperdense vessel.  Atherosclerotic calcifications. Skull: Normal. Negative for fracture or focal lesion. Sinuses/Orbits: Visualized orbits show no acute finding. Trace right maxillary sinus mucosal thickening at the imaged levels. IMPRESSION: No evidence of acute intracranial abnormality. Mild cerebral atrophy and chronic small vessel ischemic disease, stable as compared to the head CT of 12/26/2020. Electronically Signed   By: Kellie Simmering DO   On: 05/20/2021 13:40   DG Pelvis Portable  Result Date: 05/21/2021 CLINICAL DATA:  Postop right hip bipolar hemiarthroplasty. EXAM: PORTABLE PELVIS 1-2 VIEWS COMPARISON:  Radiographs 09/23/2018. FINDINGS: 1749 hours. AP view of the pelvis demonstrates interval right hip bipolar hemiarthroplasty. The hardware is well positioned. No evidence of acute fracture or dislocation. There is a small amount of gas in the right hip joint and surrounding soft tissues. Skin staples are in place. Mild lower lumbar spondylosis demonstrated. IMPRESSION: No evidence of acute complication following right hip bipolar hemiarthroplasty. Electronically Signed   By: Richardean Sale M.D.   On: 05/21/2021 18:23    Assessment/Plan: POD#1 s/p Right hip hemiarthroplasty - Appreciate hospitalist team support. Please keep strict blood glucose control to avoid periop risk of infection -  PT/OT: WBAT RLE, posterior hip precautions x 3 months - DVT ppx: lovenox to start POD#1 - Keep aquacell dressing in place unless saturated - Follow-up in clinic in 2 weeks for staple removal    Renee Harder , MD 05/22/2021, 12:53 PM

## 2021-05-22 NOTE — Evaluation (Signed)
Occupational Therapy Evaluation Patient Details Name: Breanna Petty MRN: [id number] DOB: 1941-08-22 Today's Date: 05/22/2021    History of Present Illness Pt is a 80yo F admitted to [facility] on 05/20/21 for mechanical fall that resulted in closed R hip fx. Pt s/p R hemiarthroplasty by Dr. Sharlet Salina on 05/21/21; pt is WBAT on RLE with posterior hip precautions for 57mo. Significant PMH includes: HTN, HLD, DM, COPD, GERD, depression, anxiety, CAD with stent placement, HOH, diverticulitis, sarcoidosis, SBO, CKD (stage IIIa).   Clinical Impression   Patient presenting with decreased I in self care, balance, functional mobility/transfers, endurance, and safety awareness. Patient reports being independent at baseline, lives alone, and working part time PTA. Patient currently functioning at mod - max A for self care and functional transfers. OT reviewing precautions with pt who was able to verbalize from previous session. Pt needing mod - max A to stand from elevated surfaces this session. Increased cuing and assistance with advancement of RW for stand pivot transfer to [facility] this session. Pt able to void and perform hygiene while seated before returning back to bed. Pt fatigues very quickly this session. Patient will benefit from acute OT to increase overall independence in the areas of ADLs, functional mobility, and safety awareness in order to safely discharge to next venue of care.    Follow Up Recommendations  SNF;Supervision/Assistance - 24 hour    Equipment Recommendations  Other (comment) (defer to next venue of care)       Precautions / Restrictions Precautions Precautions: Fall;Posterior Hip Precaution Booklet Issued: No Restrictions Weight Bearing Restrictions: No      Mobility Bed Mobility Overal bed mobility: Needs Assistance Bed Mobility: Sit to Supine       Sit to supine: Max assist   General bed mobility comments: assist for trunk and B LEs    Transfers Overall transfer level:  Needs assistance Equipment used: Rolling walker (2 wheeled) Transfers: Sit to/from Stand Sit to Stand: Max assist;From elevated surface;Mod assist         General transfer comment: mod A from elevated bed and max A from [facility]. Cuing for technique and hand placement.    Balance Overall balance assessment: Needs assistance Sitting-balance support: Bilateral upper extremity supported;Feet supported Sitting balance-Leahy Scale: Good     Standing balance support: Bilateral upper extremity supported;During functional activity Standing balance-Leahy Scale: Poor Standing balance comment: mod A                           ADL either performed or assessed with clinical judgement   ADL Overall ADL's : Needs assistance/impaired                         Toilet Transfer: Moderate assistance;RW;BSC   Toileting- Clothing Manipulation and Hygiene: Moderate assistance       Functional mobility during ADLs: Moderate assistance;Rolling walker       Vision Patient Visual Report: No change from baseline              Pertinent Vitals/Pain Pain Assessment: Faces Faces Pain Scale: Hurts even more Pain Location: R hip with mobility Pain Intervention(s): Limited activity within patient's tolerance;Monitored during session;Repositioned;Premedicated before session     Hand Dominance Right   Extremity/Trunk Assessment Upper Extremity Assessment Upper Extremity Assessment: Generalized weakness   Lower Extremity Assessment Lower Extremity Assessment: Generalized weakness   Cervical / Trunk Assessment Cervical / Trunk Assessment: Normal   Communication Communication  Communication: No difficulties   Cognition Arousal/Alertness: Awake/alert Behavior During Therapy: WFL for tasks assessed/performed Overall Cognitive Status: Impaired/Different from baseline Area of Impairment: Orientation                 Orientation Level: Disoriented to;Time              General Comments: May 2023. Pt is pleasant and cooperative      Exercises Other Exercises Other Exercises: Pt able to participate in transfers and short distance gait at bedside with RW. Required max-mod assist for mobility secondary to weakness and pain. Further mobility limited secondary to pain/fatigue. Other Exercises: Pt educated regarding: PT role/POC, DC recommendations, posterior hip precautions, RLE WBAT precautions, safety with mobility, and benefits of OOB mobility.   Shoulder Instructions      Home Living Family/patient expects to be discharged to:: Private residence Living Arrangements: Alone Available Help at Discharge: Family;Available PRN/intermittently Type of Home: House Home Access: Stairs to enter [employer] of Steps: front: 4 STE with bil railing, back: 3 STE without railing Entrance Stairs-Rails: Can reach both Home Layout: One level     Bathroom Shower/Tub: Tub/shower unit         Home Equipment: None          Prior Functioning/Environment Level of Independence: Independent        Comments: Pt reports being Ind with all ADL's, IADL's, community ambulation without AD, driving, and 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        OT Problem List: Decreased strength;Decreased activity tolerance;Impaired balance (sitting and/or standing);Decreased safety awareness;Decreased cognition;Decreased knowledge of precautions;Decreased knowledge of use of DME or AE      OT Treatment/Interventions: Self-care/ADL training;Manual therapy;Patient/family education;Therapeutic exercise;Modalities;Balance training;Energy conservation;Therapeutic activities;DME and/or AE instruction    OT Goals(Current goals can be found in the care plan section) Acute Rehab OT Goals Patient Stated Goal: to get better OT Goal Formulation: With patient Time For Goal Achievement: 06/05/21 Potential to Achieve Goals: Good ADL Goals Pt Will Perform  Grooming: with min guard assist;standing Pt Will Perform Lower Body Dressing: with min assist Pt Will Transfer to Toilet: with min assist Pt Will Perform Toileting - Clothing Manipulation and hygiene: with min assist  OT Frequency: Min 2X/week   Barriers to D/C: Decreased caregiver support  lives alone          AM-PAC OT "6 Clicks" Daily Activity     Outcome Measure Help from another person eating meals?: None Help from another person taking care of personal grooming?: None Help from another person toileting, which includes using toliet, bedpan, or urinal?: A Lot Help from another person bathing (including washing, rinsing, drying)?: A Lot Help from another person to put on and taking off regular upper body clothing?: A Little Help from another person to put on and taking off regular lower body clothing?: A Lot 6 Click Score: 17   End of Session Equipment Utilized During Treatment: Rolling walker;Other (comment) [facility]) Nurse Communication: Mobility status  Activity Tolerance: Patient tolerated treatment well Patient left: in bed;with bed alarm set;with call bell/phone within reach  OT Visit Diagnosis: Unsteadiness on feet (R26.81);Repeated falls (R29.6);Muscle weakness (generalized) (M62.81)                Time: 3154-0086 OT Time Calculation (min): 22 min Charges:  OT General Charges $OT Visit: 1 Visit OT Evaluation $OT Eval Moderate Complexity: 1 Mod OT Treatments $Self Care/Home Management : 8-22 mins  Darleen Crocker, MS, OTR/L ,  CBIS ascom [phone]  05/22/21, 1:12 PM

## 2021-05-23 ENCOUNTER — Other Ambulatory Visit (HOSPITAL_COMMUNITY): Payer: Self-pay

## 2021-05-23 DIAGNOSIS — I2510 Atherosclerotic heart disease of native coronary artery without angina pectoris: Secondary | ICD-10-CM | POA: Diagnosis not present

## 2021-05-23 DIAGNOSIS — S72001A Fracture of unspecified part of neck of right femur, initial encounter for closed fracture: Secondary | ICD-10-CM | POA: Diagnosis not present

## 2021-05-23 DIAGNOSIS — F419 Anxiety disorder, unspecified: Secondary | ICD-10-CM | POA: Diagnosis not present

## 2021-05-23 DIAGNOSIS — N1831 Chronic kidney disease, stage 3a: Secondary | ICD-10-CM | POA: Diagnosis not present

## 2021-05-23 LAB — GLUCOSE, CAPILLARY
Glucose-Capillary: 248 mg/dL — ABNORMAL HIGH (ref 70–99)
Glucose-Capillary: 196 mg/dL — ABNORMAL HIGH (ref 70–99)
Glucose-Capillary: 174 mg/dL — ABNORMAL HIGH (ref 70–99)
Glucose-Capillary: 155 mg/dL — ABNORMAL HIGH (ref 70–99)

## 2021-05-23 LAB — BASIC METABOLIC PANEL
Anion gap: 5 (ref 5–15)
BUN: 19 mg/dL (ref 8–23)
CO2: 25 mmol/L (ref 22–32)
Calcium: 9 mg/dL (ref 8.9–10.3)
Chloride: 104 mmol/L (ref 98–111)
Creatinine, Ser: 0.96 mg/dL (ref 0.44–1.00)
GFR, Estimated: >60 mL/min (ref >60)
Glucose, Bld: 157 mg/dL — ABNORMAL HIGH (ref 70–99)
Potassium: 4.4 mmol/L (ref 3.5–5.1)
Sodium: 134 mmol/L — ABNORMAL LOW (ref 135–145)

## 2021-05-23 LAB — CBC
HCT: 32 % — ABNORMAL LOW (ref 36.0–46.0)
Hemoglobin: 10.9 g/dL — ABNORMAL LOW (ref 12.0–15.0)
MCH: 31.1 pg (ref 26.0–34.0)
MCHC: 34.1 g/dL (ref 30.0–36.0)
MCV: 91.2 fL (ref 80.0–100.0)
Platelets: 166 10*3/uL (ref 150–400)
RBC: 3.51 MIL/uL — ABNORMAL LOW (ref 3.87–5.11)
RDW: 14.2 % (ref 11.5–15.5)
WBC: 10.9 10*3/uL — ABNORMAL HIGH (ref 4.0–10.5)
nRBC: 0 % (ref 0.0–0.2)

## 2021-05-23 LAB — SURGICAL PATHOLOGY

## 2021-05-23 MED ORDER — INSULIN GLARGINE 100 UNIT/ML ~~LOC~~ SOLN
3.0000 [IU] | Freq: Every day | SUBCUTANEOUS | Status: DC
  Administered 2021-05-23 – 2021-05-29 (×7): 3 [IU] via SUBCUTANEOUS
  Filled 2021-05-23 (×7): qty 0.03

## 2021-05-23 NOTE — Progress Notes (Signed)
Occupational Therapy Treatment Patient Details Name: KHADEJA ABT MRN: [id number] DOB: [birth year] Today's Date: 05/23/2021    History of present illness Pt is a 80yo F admitted to [facility] on [facility]/21 for mechanical fall that resulted in closed R hip fx. Pt s/p R hemiarthroplasty by Dr. Sharlet Salina on 05/21/21; pt is WBAT on RLE with posterior hip precautions for 85mo. Significant PMH includes: HTN, HLD, DM, COPD, GERD, depression, anxiety, CAD with stent placement, HOH, diverticulitis, sarcoidosis, SBO, CKD (stage IIIa).   OT comments  Upon entering the room, pt supine in bed and sleeping soundly. Pt is easily arouse for OT intervention and agreeable. Pt with several questions and comments about SNF recommendation for short term rehab with therapist explaining further. Pt verbalized understanding and reports what she has done today with PT. Pt declined  OOB secondary to fatigue but agreeable to B UE strengthening exercises. OT demonstrates with pt returning demonstrations with min cuing for proper technique. Pt performs 3 sets of 10 chest pulls, alternating punches, and shoulder elevation. Pt needing rest break secondary to fatigue.Pt remains in bed with bed alarm activated and all needs within reach.   Follow Up Recommendations  SNF;Supervision/Assistance - 24 hour    Equipment Recommendations  Other (comment) (defer to next venue of care)    Recommendations for Other Services      Precautions / Restrictions Precautions Precautions: Fall;Posterior Hip Precaution Booklet Issued: Yes (comment) Restrictions Weight Bearing Restrictions: Yes RLE Weight Bearing: Weight bearing as tolerated              ADL either performed or assessed with clinical judgement     Vision Patient Visual Report: No change from baseline            Cognition Arousal/Alertness: Awake/alert Behavior During Therapy: WFL for tasks assessed/performed Overall Cognitive Status: Within Functional Limits for tasks  assessed                                 General Comments: Pt needing min cuing for proper technique with therapeutic exercise        Exercises Total Joint Exercises Gluteal Sets: AROM;Strengthening;10 reps;Supine Short Arc Quad: AAROM;Strengthening;Right;10 reps;Supine Heel Slides: AAROM;Strengthening;Right;10 reps;Supine Hip ABduction/ADduction: AAROM;Strengthening;Right;10 reps;Supine (hip ABduction) Straight Leg Raises: AAROM;Strengthening;Right;10 reps;Supine   Shoulder Instructions       General Comments      Pertinent Vitals/ Pain       Pain Assessment: Faces Faces Pain Scale: No hurt Pain Location: R hip Pain Descriptors / Indicators: Sore;Grimacing Pain Intervention(s): Monitored during session;Repositioned         Frequency  Min 2X/week        Progress Toward Goals  OT Goals(current goals can now be found in the care plan section)  Progress towards OT goals: Progressing toward goals  Acute Rehab OT Goals Patient Stated Goal: to get better OT Goal Formulation: With patient Time For Goal Achievement: 06/05/21 Potential to Achieve Goals: Good  Plan Discharge plan remains appropriate;Frequency remains appropriate       AM-PAC OT "6 Clicks" Daily Activity     Outcome Measure   Help from another person eating meals?: None Help from another person taking care of personal grooming?: None Help from another person toileting, which includes using toliet, bedpan, or urinal?: A Lot Help from another person bathing (including washing, rinsing, drying)?: A Lot Help from another person to put on and taking off regular upper body clothing?:  A Little Help from another person to put on and taking off regular lower body clothing?: A Lot 6 Click Score: 17    End of Session    OT Visit Diagnosis: Unsteadiness on feet (R26.81);Repeated falls (R29.6);Muscle weakness (generalized) (M62.81)   Activity Tolerance Patient tolerated treatment well    Patient Left in bed;with bed alarm set;with call bell/phone within reach   Nurse Communication Mobility status        Time: 1518-3437 OT Time Calculation (min): 28 min  Charges: OT General Charges $OT Visit: 1 Visit OT Treatments $Therapeutic Activity: 8-22 mins $Therapeutic Exercise: 8-22 mins  Darleen Crocker, MS, OTR/L , CBIS ascom [phone]  05/23/21, 4:07 PM

## 2021-05-23 NOTE — Progress Notes (Signed)
PROGRESS NOTE    Breanna Petty  ASN:[id number] DOB: Nov 17, 1940 DOA: 05/20/2021 PCP: Breanna Rossetti, PA-C    Brief Narrative:  Breanna Petty is a 80 y.o. female with medical history significant of hypertension, hyperlipidemia, diabetes mellitus, COPD not on oxygen, GERD, depression, anxiety, CAD with stent placement, hard of hearing, diverticulitis, sarcoidosis, small bowel obstruction, CKD stage IIIa, who presents with fall and right hip pain.     Pt states that she fell down 2 steps accidentally when she tripped her steps and lost balance at home. No LOC.  Denies head or neck injury.  Developed right hip pain, which is constant, sharp, 10 out of 10 severity, nonradiating.  No leg numbness or weakness.  Patient denies any chest pain, cough, shortness breath.  No fever or chills.  Denies nausea vomiting, diarrhea or abdominal pain.  No symptoms of UTI.   7/7 status post hemiarthroplasty of right hip on 7/6 by Dr. Sharlet Salina.  Pain controlled.  Patient has no complaints 7/8-reports felt nauseas and lightheaded when she started walking a little with PT this am. No sob or  cp. Discussed possible had vasovagal sx.   Consultants:  orthopedics  Procedures:   Antimicrobials:      Subjective: No sob, cp, abd pain, or foot pain  Objective: Vitals:   05/22/21 1135 05/22/21 1542 05/22/21 1926 05/23/21 0610  BP: (!) 144/52 (!) 147/53 (!) 126/49 131/67  Pulse: 80 95 91 98  Resp: 19 17 20 15   Temp: 99 F (37.2 C) 99.5 F (37.5 C) 99 F (37.2 C) (!) 100.6 F (38.1 C)  TempSrc:   Oral Oral  SpO2: 100% 100% 98% 100%  Weight:      Height:        Intake/Output Summary (Last 24 hours) at 05/23/2021 0804 Last data filed at 05/23/2021 0743 Gross per 24 hour  Intake 240 ml  Output 2 ml  Net 238 ml   Filed Weights   05/20/21 1001 05/21/21 1423  Weight: 73.5 kg 73.5 kg    Examination: NAD, calm CTA no wheeze Regular S1-S2 no gallops No edema Mood and affect appropriate in  current setting Alert oriented x4   Data Reviewed: I have personally reviewed following labs and imaging studies  CBC: Recent Labs  Lab 05/20/21 1004 05/21/21 0433 05/23/21 0625  WBC 9.4 8.2 10.9*  NEUTROABS 6.5  --   --   HGB 11.6* 11.8* 10.9*  HCT 34.7* 35.1* 32.0*  MCV 93.8 91.4 91.2  PLT 192 188 193   Basic Metabolic Panel: Recent Labs  Lab 05/20/21 1004 05/23/21 0625  NA 135 134*  K 4.1 4.4  CL 100 104  CO2 28 25  GLUCOSE 128* 157*  BUN 18 19  CREATININE 1.05* 0.96  CALCIUM 9.6 9.0   GFR: Estimated Creatinine Clearance: 53.1 mL/min (by C-G formula based on SCr of 0.96 mg/dL). Liver Function Tests: Recent Labs  Lab 05/20/21 1004  AST 26  ALT 16  ALKPHOS 55  BILITOT 0.9  PROT 7.0  ALBUMIN 4.1   No results for input(s): LIPASE, AMYLASE in the last 168 hours. No results for input(s): AMMONIA in the last 168 hours. Coagulation Profile: Recent Labs  Lab 05/20/21 1004  INR 1.0   Cardiac Enzymes: No results for input(s): CKTOTAL, CKMB, CKMBINDEX, TROPONINI in the last 168 hours. BNP (last 3 results) No results for input(s): PROBNP in the last 8760 hours. HbA1C: No results for input(s): HGBA1C in the last 72 hours. CBG: Recent  Labs  Lab 05/22/21 0757 05/22/21 1217 05/22/21 1603 05/22/21 2054 05/23/21 0742  GLUCAP 219* 186* 185* 186* 248*   Lipid Profile: No results for input(s): CHOL, HDL, LDLCALC, TRIG, CHOLHDL, LDLDIRECT in the last 72 hours. Thyroid Function Tests: No results for input(s): TSH, T4TOTAL, FREET4, T3FREE, THYROIDAB in the last 72 hours. Anemia Panel: No results for input(s): VITAMINB12, FOLATE, FERRITIN, TIBC, IRON, RETICCTPCT in the last 72 hours. Sepsis Labs: No results for input(s): PROCALCITON, LATICACIDVEN in the last 168 hours.  Recent Results (from the past 240 hour(s))  SARS CORONAVIRUS 2 (TAT 6-24 HRS) Nasopharyngeal Nasopharyngeal Swab     Status: None   Collection Time: 05/17/21 12:56 PM   Specimen:  Nasopharyngeal Swab  Result Value Ref Range Status   SARS Coronavirus 2 NEGATIVE NEGATIVE Final    Comment: (NOTE) SARS-CoV-2 target nucleic acids are NOT DETECTED.  The SARS-CoV-2 RNA is generally detectable in upper and lower respiratory specimens during the acute phase of infection. Negative results do not preclude SARS-CoV-2 infection, do not rule out co-infections with other pathogens, and should not be used as the sole basis for treatment or other patient management decisions. Negative results must be combined with clinical observations, patient history, and epidemiological information. The expected result is Negative.  Fact Sheet for Patients: SugarRoll.be  Fact Sheet for Healthcare Providers: https://www.woods-mathews.com/  This test is not yet approved or cleared by the [place] FDA and  has been authorized for detection and/or diagnosis of SARS-CoV-2 by FDA under an Emergency Use Authorization (EUA). This EUA will remain  in effect (meaning this test can be used) for the duration of the COVID-19 declaration under Se ction 564(b)(1) of the Act, 21 U.S.C. section 360bbb-3(b)(1), unless the authorization is terminated or revoked sooner.  Performed at [facility], [place] [address], [place], [place] [address]   Resp Panel by RT-PCR (Flu A&B, Covid) Nasopharyngeal Swab     Status: None   Collection Time: 05/20/21 10:31 AM   Specimen: Nasopharyngeal Swab; Nasopharyngeal(NP) swabs in vial transport medium  Result Value Ref Range Status   SARS Coronavirus 2 by RT PCR NEGATIVE NEGATIVE Final    Comment: (NOTE) SARS-CoV-2 target nucleic acids are NOT DETECTED.  The SARS-CoV-2 RNA is generally detectable in upper respiratory specimens during the acute phase of infection. The lowest concentration of SARS-CoV-2 viral copies this assay can detect is 138 copies/mL. A negative result does not preclude SARS-Cov-2 infection and  should not be used as the sole basis for treatment or other patient management decisions. A negative result may occur with  improper specimen collection/handling, submission of specimen other than nasopharyngeal swab, presence of viral mutation(s) within the areas targeted by this assay, and inadequate number of viral copies(<138 copies/mL). A negative result must be combined with clinical observations, patient history, and epidemiological information. The expected result is Negative.  Fact Sheet for Patients:  EntrepreneurPulse.com.au  Fact Sheet for Healthcare Providers:  IncredibleEmployment.be  This test is no t yet approved or cleared by the [place] FDA and  has been authorized for detection and/or diagnosis of SARS-CoV-2 by FDA under an Emergency Use Authorization (EUA). This EUA will remain  in effect (meaning this test can be used) for the duration of the COVID-19 declaration under Section 564(b)(1) of the Act, 21 U.S.C.section 360bbb-3(b)(1), unless the authorization is terminated  or revoked sooner.       Influenza A by PCR NEGATIVE NEGATIVE Final   Influenza B by PCR NEGATIVE NEGATIVE Final  Comment: (NOTE) The Xpert Xpress SARS-CoV-2/FLU/RSV plus assay is intended as an aid in the diagnosis of influenza from Nasopharyngeal swab specimens and should not be used as a sole basis for treatment. Nasal washings and aspirates are unacceptable for Xpert Xpress SARS-CoV-2/FLU/RSV testing.  Fact Sheet for Patients: EntrepreneurPulse.com.au  Fact Sheet for Healthcare Providers: IncredibleEmployment.be  This test is not yet approved or cleared by the [place] FDA and has been authorized for detection and/or diagnosis of SARS-CoV-2 by FDA under an Emergency Use Authorization (EUA). This EUA will remain in effect (meaning this test can be used) for the duration of the COVID-19 declaration  under Section 564(b)(1) of the Act, 21 U.S.C. section 360bbb-3(b)(1), unless the authorization is terminated or revoked.  Performed at [facility], [address] [place], [place] [address]   Surgical PCR screen     Status: None   Collection Time: 05/20/21  4:52 PM   Specimen: Nasal Mucosa; Nasal Swab  Result Value Ref Range Status   MRSA, PCR NEGATIVE NEGATIVE Final   Staphylococcus aureus NEGATIVE NEGATIVE Final    Comment: (NOTE) The Xpert SA Assay (FDA approved for NASAL specimens in patients 55 years of age and older), is one component of a comprehensive surveillance program. It is not intended to diagnose infection nor to guide or monitor treatment. Performed at [facility], [address] [place], [place] [address]          Radiology Studies: DG Pelvis Portable  Result Date: 05/21/2021 CLINICAL DATA:  Postop right hip bipolar hemiarthroplasty. EXAM: PORTABLE PELVIS 1-2 VIEWS COMPARISON:  Radiographs 09/23/2018. FINDINGS: 1749 hours. AP view of the pelvis demonstrates interval right hip bipolar hemiarthroplasty. The hardware is well positioned. No evidence of acute fracture or dislocation. There is a small amount of gas in the right hip joint and surrounding soft tissues. Skin staples are in place. Mild lower lumbar spondylosis demonstrated. IMPRESSION: No evidence of acute complication following right hip bipolar hemiarthroplasty. Electronically Signed   By: Richardean Sale M.D.   On: 05/21/2021 18:23        Scheduled Meds:  amLODipine  5 mg Oral Daily   aspirin EC  81 mg Oral Daily   enoxaparin (LOVENOX) injection  40 mg Subcutaneous Q24H   insulin aspart  0-5 Units Subcutaneous QHS   insulin aspart  0-9 Units Subcutaneous TID WC   latanoprost  1 drop Both Eyes QHS   losartan  50 mg Oral Daily   mirabegron ER  50 mg Oral Daily   multivitamin with minerals  1 tablet Oral Daily   mupirocin ointment  1 application Nasal BID   pantoprazole  40  mg Oral BID   pravastatin  80 mg Oral Daily   Continuous Infusions:  sodium chloride 10 mL/hr at 05/22/21 0602    Assessment & Plan:   Principal Problem:   Closed right hip fracture (HCC) Active Problems:   Anxiety and depression   Dyslipidemia   Gastro-esophageal reflux disease without esophagitis   Chronic obstructive pulmonary disease (HCC)   CAD (coronary artery disease)   HTN (hypertension)   Type II diabetes mellitus with renal manifestations ([place])   CKD (chronic kidney disease), stage IIIa   Fall at home, initial encounter   Closed right hip fracture ([place]):  As evidenced by x-ray. Patient has moderate pain now. No neurovascular compromise.  Ortho following  7/8 status post hemiarthroplasty on 7/6 by Dr. Sharlet Salina  PT OT recommend SNF  Bowel regimen  WBAT RLE,  posterior hip precautions x3 months Follow-up in orthopedics clinic in 2 weeks for staple removal and x-ray Dressing to remain in place until follow-up in clinic  For DVT prophylaxis start Lovenox on postop day 1 for 2 weeks       Anxiety and depression Continue Ativan   Dyslipidemia Continue pravastatin   Gastro-esophageal reflux disease without esophagitis Continue Protonix   Chronic obstructive pulmonary disease ([place]): Stable -Bronchodilators as needed   CAD (coronary artery disease): S/p of stent placement the patient does not have any chest pain or shortness breath. -As needed nitroglycerin -Lipitor -Continue aspirin, but will skip tomorrow for surgery, and then restart on 7/7 if ok by surgery   HTN (hypertension) -IV hydralazine as needed -Amlodipine, Cozaar,   Type II diabetes mellitus with renal manifestations ([place]): Recent A1c 6.7, well controlled.  Patient taking Januvia at home. -Sliding scale insulin   CKD (chronic kidney disease), stage IIIa: Close to baseline.  Baseline creatinine 0.96-1.0 recently.  Her creatinine is 1.05, BUN 18. 7/8 creatinine 0.96.  Stable   Fall at home,  initial encounter: -Follow-up CT head -->negative -pt/ot when able to   DVT prophylaxis: Lovenox Code Status: Full Family Communication: none at bedside Disposition Plan:  Status is: Inpatient  Remains inpatient appropriate because:Inpatient level of care appropriate due to severity of illness  Dispo: The patient is from: Home              Anticipated d/c is to: SNF              Patient currently is not medically stable to d/c.   Difficult to place patient No            LOS: 3 days   Time spent: 35 minutes with more than 50% on [place], MD [facility] Pager [phone]  If 7PM-7AM, please contact night-coverage 05/23/2021, 8:04 AM

## 2021-05-23 NOTE — Progress Notes (Signed)
Physical Therapy Treatment Patient Details Name: Breanna Petty MRN: [id number] DOB: [birth year] Today's Date: 05/23/2021    History of Present Illness Pt is a 80yo F admitted to [facility] on [facility]/20/21 for mechanical fall that resulted in closed R hip fx. Pt s/p R hemiarthroplasty by Dr. Sharlet Salina on 05/21/21; pt is WBAT on RLE with posterior hip precautions for 26mo. Significant PMH includes: HTN, HLD, DM, COPD, GERD, depression, anxiety, CAD with stent placement, HOH, diverticulitis, sarcoidosis, SBO, CKD (stage IIIa).    PT Comments    Pt seen for PT tx with pt appearing fatigued but only endorses RLE pain with mobility. Pt agreeable to bed level exercises as she does not appear very motivated to ambulate at this time. Pt requires active assistive movement to perform RLE strengthening exercises. Pt very appreciative of PT intervention.    Follow Up Recommendations  SNF;Supervision for mobility/OOB     Equipment Recommendations  Rolling walker with 5" wheels;3in1 (PT)    Recommendations for Other Services       Precautions / Restrictions Precautions Precautions: Fall;Posterior Hip Precaution Booklet Issued: Yes (comment) Restrictions Weight Bearing Restrictions: Yes RLE Weight Bearing: Weight bearing as tolerated    Mobility  Bed Mobility               General bed mobility comments: not observed, pt received & left sitting up in recliner    Transfers Overall transfer level: Needs assistance Equipment used: Rolling walker (2 wheeled) Transfers: Sit to/from Stand Sit to Stand: Max assist;Mod assist         General transfer comment: cuing for hand placement, LLE underneath BOS, RLE out in front during sit>stand  Ambulation/Gait Ambulation/Gait assistance: Mod assist Gait Distance (Feet): 5 Feet Assistive device: Rolling walker (2 wheeled) Gait Pattern/deviations: Decreased step length - left;Decreased step length - right;Decreased stride length;Decreased dorsiflexion -  right;Decreased weight shift to right Gait velocity: decreased       Stairs             Wheelchair Mobility    Modified Rankin (Stroke Patients Only)       Balance Overall balance assessment: Needs assistance         Standing balance support: Bilateral upper extremity supported;During functional activity Standing balance-Leahy Scale: Poor Standing balance comment: BUE reliance on RW                            Cognition Arousal/Alertness: Awake/alert Behavior During Therapy: WFL for tasks assessed/performed Overall Cognitive Status: Within Functional Limits for tasks assessed                                        Exercises Total Joint Exercises Ankle Circles/Pumps: AROM;Both;10 reps Quad Sets: AROM;Strengthening;Right;10 reps Gluteal Sets: AROM;Strengthening;10 reps;Supine Short Arc Quad: AAROM;Strengthening;Right;10 reps;Supine Heel Slides: AAROM;Strengthening;Right;10 reps;Supine Hip ABduction/ADduction: AAROM;Strengthening;Right;10 reps;Supine (hip ABduction) Straight Leg Raises: AAROM;Strengthening;Right;10 reps;Supine Long Arc Quad: AROM;Strengthening;Right;10 reps;Seated    General Comments        Pertinent Vitals/Pain Pain Assessment: Faces Pain Score: 6  Faces Pain Scale: Hurts little more Pain Location: R hip Pain Descriptors / Indicators: Sore;Grimacing Pain Intervention(s): Monitored during session;Repositioned    Home Living                      Prior Function  PT Goals (current goals can now be found in the care plan section) Acute Rehab PT Goals Patient Stated Goal: to get better PT Goal Formulation: With patient Time For Goal Achievement: 06/05/21 Potential to Achieve Goals: Good Progress towards PT goals: Progressing toward goals    Frequency    BID      PT Plan Current plan remains appropriate    Co-evaluation              AM-PAC PT "6 Clicks" Mobility    Outcome Measure  Help needed turning from your back to your side while in a flat bed without using bedrails?: A Little Help needed moving from lying on your back to sitting on the side of a flat bed without using bedrails?: A Lot Help needed moving to and from a bed to a chair (including a wheelchair)?: A Lot Help needed standing up from a chair using your arms (e.g., wheelchair or bedside chair)?: A Lot Help needed to walk in hospital room?: A Lot Help needed climbing 3-5 steps with a railing? : Total 6 Click Score: 12    End of Session Equipment Utilized During Treatment: Gait belt Activity Tolerance: Patient tolerated treatment well Patient left: in bed;with call bell/phone within reach;with bed alarm set Nurse Communication: Mobility status PT Visit Diagnosis: Unsteadiness on feet (R26.81);Muscle weakness (generalized) (M62.81);History of falling (Z91.81);Pain Pain - Right/Left: Right Pain - part of body: Hip     Time: 7473-4037 PT Time Calculation (min) (ACUTE ONLY): 8 min  Charges:  $Therapeutic Exercise: 8-22 mins $Therapeutic Activity: 23-37 mins                     Breanna Petty, PT, DPT 05/23/21, 1:41 PM    Waunita Schooner 05/23/2021, 1:40 PM

## 2021-05-23 NOTE — Progress Notes (Signed)
Physical Therapy Treatment Patient Details Name: Breanna Petty MRN: [id number] DOB: 08-08-1941 Today's Date: 05/23/2021    History of Present Illness Pt is a 80yo F admitted to [facility] on 05/20/21 for mechanical fall that resulted in closed R hip fx. Pt s/p R hemiarthroplasty by Dr. Sharlet Salina on 05/21/21; pt is WBAT on RLE with posterior hip precautions for 51mo. Significant PMH includes: HTN, HLD, DM, COPD, GERD, depression, anxiety, CAD with stent placement, HOH, diverticulitis, sarcoidosis, SBO, CKD (stage IIIa).    PT Comments    Pt seen for PT tx with pt agreeable to session. Pt endorses R hip soreness but believes she is premedicated & rest breaks provided PRN.  PT instructs pt on RLE strengthening exercises; pt able to demonstrate half of available ROM during RLE LAQ. Pt requires cuing for hand & BLE placement during sit<>stand transfers & mod/max assist. Pt is able to ambulate 5 ft in room with RW before endorsing dizziness & nausea so pt assisted to sitting & rest break provided. Once feeling better, pt takes a few steps to recliner. BP 128/55 mmHg in RUE (MAP 77), HR 93 bpm.   Pt able to recall 1/3 posterior hip precautions with PT educating her on 3/3.    Follow Up Recommendations  SNF;Supervision for mobility/OOB     Equipment Recommendations  Rolling walker with 5" wheels;3in1 (PT)    Recommendations for Other Services       Precautions / Restrictions Precautions Precautions: Fall;Posterior Hip Restrictions Weight Bearing Restrictions: Yes RLE Weight Bearing: Weight bearing as tolerated    Mobility  Bed Mobility               General bed mobility comments: not observed, pt received & left sitting up in recliner    Transfers Overall transfer level: Needs assistance Equipment used: Rolling walker (2 wheeled) Transfers: Sit to/from Stand Sit to Stand: Max assist;Mod assist         General transfer comment: cuing for hand placement, LLE underneath BOS, RLE out in  front during sit>stand  Ambulation/Gait Ambulation/Gait assistance: Mod assist Gait Distance (Feet): 5 Feet Assistive device: Rolling walker (2 wheeled) Gait Pattern/deviations: Decreased step length - left;Decreased step length - right;Decreased stride length;Decreased dorsiflexion - right;Decreased weight shift to right Gait velocity: decreased       Stairs             Wheelchair Mobility    Modified Rankin (Stroke Patients Only)       Balance Overall balance assessment: Needs assistance         Standing balance support: Bilateral upper extremity supported;During functional activity Standing balance-Leahy Scale: Poor Standing balance comment: BUE reliance on RW                            Cognition Arousal/Alertness: Awake/alert Behavior During Therapy: WFL for tasks assessed/performed Overall Cognitive Status: Within Functional Limits for tasks assessed                                        Exercises Total Joint Exercises Ankle Circles/Pumps: AROM;Both;10 reps Quad Sets: AROM;Strengthening;Right;10 reps Long Arc Quad: AROM;Strengthening;Right;10 reps;Seated    General Comments        Pertinent Vitals/Pain Pain Assessment: 0-10 Pain Score: 6  Pain Location: R hip Pain Descriptors / Indicators: Sore Pain Intervention(s): Premedicated before session;Monitored during session;Limited activity  within patient's tolerance    Home Living                      Prior Function            PT Goals (current goals can now be found in the care plan section) Acute Rehab PT Goals Patient Stated Goal: to get better PT Goal Formulation: With patient Time For Goal Achievement: 06/05/21 Potential to Achieve Goals: Good Progress towards PT goals: Progressing toward goals    Frequency    BID      PT Plan Current plan remains appropriate    Co-evaluation              AM-PAC PT "6 Clicks" Mobility   Outcome  Measure  Help needed turning from your back to your side while in a flat bed without using bedrails?: A Little Help needed moving from lying on your back to sitting on the side of a flat bed without using bedrails?: A Little Help needed moving to and from a bed to a chair (including a wheelchair)?: A Lot Help needed standing up from a chair using your arms (e.g., wheelchair or bedside chair)?: A Lot Help needed to walk in hospital room?: A Lot Help needed climbing 3-5 steps with a railing? : Total 6 Click Score: 13    End of Session Equipment Utilized During Treatment: Gait belt Activity Tolerance: Patient limited by pain (limited by nausea & dizziness) Patient left: in chair;with call bell/phone within reach;with chair alarm set Nurse Communication: Mobility status PT Visit Diagnosis: Unsteadiness on feet (R26.81);Muscle weakness (generalized) (M62.81);History of falling (Z91.81);Pain Pain - Right/Left: Right Pain - part of body: Hip     Time: 3428-7681 PT Time Calculation (min) (ACUTE ONLY): 25 min  Charges:  $Therapeutic Activity: 23-37 mins                     Breanna Petty, PT, DPT 05/23/21, 10:31 AM    Breanna Petty 05/23/2021, 10:29 AM

## 2021-05-23 NOTE — TOC Progression Note (Addendum)
Transition of Care [facility]) - Progression Note    Patient Details  Name: Breanna Petty MRN: [id number] Date of Birth: 02-27-1941  [facility] of [facility]) CM/SW [place], RN Phone Number: 05/23/2021, 9:29 AM  Clinical Narrative:    Reached out to the local facilities and requested them to look at the referral and make a bed offer, will review with the patient once obtained The patient would really like to go home and is working on finding a family member to stay with her to help her at home,  she will need a RW and a 3 in 1 if she goes home, will consider STR once bed offers available       Expected Discharge Plan and Services                                                 Social Determinants of Health (SDOH) Interventions    Readmission Risk Interventions No flowsheet data found.

## 2021-05-24 DIAGNOSIS — S72001S Fracture of unspecified part of neck of right femur, sequela: Secondary | ICD-10-CM | POA: Diagnosis not present

## 2021-05-24 DIAGNOSIS — F419 Anxiety disorder, unspecified: Secondary | ICD-10-CM | POA: Diagnosis not present

## 2021-05-24 DIAGNOSIS — N1831 Chronic kidney disease, stage 3a: Secondary | ICD-10-CM | POA: Diagnosis not present

## 2021-05-24 DIAGNOSIS — I2510 Atherosclerotic heart disease of native coronary artery without angina pectoris: Secondary | ICD-10-CM | POA: Diagnosis not present

## 2021-05-24 LAB — BASIC METABOLIC PANEL
Anion gap: 4 — ABNORMAL LOW (ref 5–15)
BUN: 34 mg/dL — ABNORMAL HIGH (ref 8–23)
CO2: 26 mmol/L (ref 22–32)
Calcium: 8.9 mg/dL (ref 8.9–10.3)
Chloride: 102 mmol/L (ref 98–111)
Creatinine, Ser: 1.25 mg/dL — ABNORMAL HIGH (ref 0.44–1.00)
GFR, Estimated: 44 mL/min — ABNORMAL LOW (ref >60)
Glucose, Bld: 166 mg/dL — ABNORMAL HIGH (ref 70–99)
Potassium: 4.2 mmol/L (ref 3.5–5.1)
Sodium: 132 mmol/L — ABNORMAL LOW (ref 135–145)

## 2021-05-24 LAB — GLUCOSE, CAPILLARY
Glucose-Capillary: 212 mg/dL — ABNORMAL HIGH (ref 70–99)
Glucose-Capillary: 175 mg/dL — ABNORMAL HIGH (ref 70–99)
Glucose-Capillary: 170 mg/dL — ABNORMAL HIGH (ref 70–99)
Glucose-Capillary: 159 mg/dL — ABNORMAL HIGH (ref 70–99)

## 2021-05-24 MED ORDER — POLYETHYLENE GLYCOL 3350 17 G PO PACK
17.0000 g | PACK | Freq: Every day | ORAL | Status: DC
  Administered 2021-05-24 – 2021-05-29 (×5): 17 g via ORAL
  Filled 2021-05-24 (×6): qty 1

## 2021-05-24 MED ORDER — SENNOSIDES-DOCUSATE SODIUM 8.6-50 MG PO TABS
1.0000 | ORAL_TABLET | Freq: Two times a day (BID) | ORAL | Status: DC
  Administered 2021-05-24 – 2021-05-26 (×5): 1 via ORAL
  Filled 2021-05-24 (×5): qty 1

## 2021-05-24 MED ORDER — FLEET ENEMA 7-19 GM/118ML RE ENEM: 1.0000 | ENEMA | Freq: Every day | RECTAL | Status: DC | PRN

## 2021-05-24 NOTE — Progress Notes (Signed)
  Subjective:  POD #3 s/p right hip hemiarthroplasty.   Patient reports right hip pain as mild.  Patient sitting up out of bed in the chair.  She is tolerating a p.o. diet.  Objective:   VITALS:   Vitals:   05/24/21 0133 05/24/21 0454 05/24/21 0828 05/24/21 1138  BP:  129/62 (!) 132/52 (!) 113/55  Pulse:  98 99 94  Resp:  16 19 20   Temp:  99 F (37.2 C) 98.2 F (36.8 C) 98.7 F (37.1 C)  TempSrc:      SpO2:  100% 100% 99%  Weight: 74.4 kg     Height:        PHYSICAL EXAM: Right lower extremity Neurovascular intact Sensation intact distally Intact pulses distally Dorsiflexion/Plantar flexion intact Aquacel dressing has moderate serosanguineous drainage. No cellulitis present Compartment soft  LABS  Results for orders placed or performed during the hospital encounter of 05/20/21 (from the past 24 hour(s))  Glucose, capillary     Status: Abnormal   Collection Time: 05/23/21  4:49 PM  Result Value Ref Range   Glucose-Capillary 174 (H) 70 - 99 mg/dL  Glucose, capillary     Status: Abnormal   Collection Time: 05/23/21  8:42 PM  Result Value Ref Range   Glucose-Capillary 155 (H) 70 - 99 mg/dL   Comment 1 Notify RN   Basic metabolic panel     Status: Abnormal   Collection Time: 05/24/21  5:01 AM  Result Value Ref Range   Sodium 132 (L) 135 - 145 mmol/L   Potassium 4.2 3.5 - 5.1 mmol/L   Chloride 102 98 - 111 mmol/L   CO2 26 22 - 32 mmol/L   Glucose, Bld 166 (H) 70 - 99 mg/dL   BUN 34 (H) 8 - 23 mg/dL   Creatinine, Ser 1.25 (H) 0.44 - 1.00 mg/dL   Calcium 8.9 8.9 - 10.3 mg/dL   GFR, Estimated 44 (L) >60 mL/min   Anion gap 4 (L) 5 - 15  Glucose, capillary     Status: Abnormal   Collection Time: 05/24/21  8:40 AM  Result Value Ref Range   Glucose-Capillary 212 (H) 70 - 99 mg/dL  Glucose, capillary     Status: Abnormal   Collection Time: 05/24/21 12:09 PM  Result Value Ref Range   Glucose-Capillary 175 (H) 70 - 99 mg/dL    No results  found.  Assessment/Plan: 3 Days Post-Op   Principal Problem:   Closed right hip fracture (HCC) Active Problems:   Anxiety and depression   Dyslipidemia   Gastro-esophageal reflux disease without esophagitis   Chronic obstructive pulmonary disease (HCC)   CAD (coronary artery disease)   HTN (hypertension)   Type II diabetes mellitus with renal manifestations (HCC)   CKD (chronic kidney disease), stage IIIa   Fall at home, initial encounter  Patient stable from an orthopedic standpoint.  Continue physical therapy.  Continue weightbearing as tolerated with posterior hip precautions.  Vital signs are stable.  Hemoglobin and hematocrit have been stable.  Continue Lovenox for DVT prophylaxis.    Thornton Park , MD 05/24/2021, 12:31 PM

## 2021-05-24 NOTE — TOC Progression Note (Addendum)
Transition of Care [facility]) - Progression Note    Patient Details  Name: Breanna Petty MRN: [id number] Date of Birth: Dec 03, 1940  [facility] CM/SW Contact  Izola Price, RN Phone Number: 05/24/2021, 10:17 AM  Clinical Narrative:  Bed search started by CM 7/7. PT notified this CM he spoke with son and again, he is agreeable. Waiting on bed offers. As of 1018 only [facility] has accepted. Will follow up with son. Simmie Davies RN CM           Expected Discharge Plan and Services                                                 Social Determinants of Health (SDOH) Interventions    Readmission Risk Interventions No flowsheet data found.

## 2021-05-24 NOTE — Progress Notes (Signed)
Physical Therapy Treatment Patient Details Name: Breanna Petty MRN: [id number] DOB: 1941-05-08 Today's Date: 05/24/2021    History of Present Illness Pt is a 80yo F admitted to [facility] on 05/20/21 for mechanical fall that resulted in closed R hip fx. Pt s/p R hemiarthroplasty by Dr. Sharlet Salina on 05/21/21; pt is WBAT on RLE with posterior hip precautions for 41mo. Significant PMH includes: HTN, HLD, DM, COPD, GERD, depression, anxiety, CAD with stent placement, HOH, diverticulitis, sarcoidosis, SBO, CKD (stage IIIa).    PT Comments    Pt was long sitting in bed upon arriving. She easily awakes and does agree to session with minimal encouragement. Pt is A but oriented only x 2. Unable to recall posterior hip precautions. Re-educated pt on precautions and what to expect going forward. Discussed recommendation for SNF at DC with pt and son (phone). Both are agreeable to rehab at DC. Pt continues to require assistance with all mobility, transfers, and gait. Highly recommend DC to rehab to progress pt to PLOF. She was sitting in recliner with chair alarm in place and call bell in reach at conclusion of session.    Follow Up Recommendations  SNF;Supervision for mobility/OOB     Equipment Recommendations  Rolling walker with 5" wheels;3in1 (PT)       Precautions / Restrictions Precautions Precautions: Fall;Posterior Hip Precaution Booklet Issued: Yes (comment) Restrictions Weight Bearing Restrictions: Yes RLE Weight Bearing: Weight bearing as tolerated    Mobility  Bed Mobility Overal bed mobility: Needs Assistance Bed Mobility: Supine to Sit     Supine to sit: Max assist     General bed mobility comments: Pt required max assist to exit bed. INcreased time and alot of vcs for technique, sequencing, and safety    Transfers Overall transfer level: Needs assistance Equipment used: Rolling walker (2 wheeled) Transfers: Sit to/from Stand Sit to Stand: Mod assist;From elevated surface          General transfer comment: mod assist to stand 2 x EOB. vcs and mod asisst required  Ambulation/Gait Ambulation/Gait assistance: Min assist Gait Distance (Feet): 8 Feet Assistive device: Rolling walker (2 wheeled) Gait Pattern/deviations: Decreased step length - left;Decreased step length - right;Decreased stride length;Decreased dorsiflexion - right;Decreased weight shift to right Gait velocity: decreased   General Gait Details: Pt was able to ambulate 8 ft with RW + slow step to gait pattern. vcs throughout for improved technique and sequencing    Balance Overall balance assessment: Needs assistance Sitting-balance support: Bilateral upper extremity supported;Feet supported Sitting balance-Leahy Scale: Good     Standing balance support: Bilateral upper extremity supported;During functional activity Standing balance-Leahy Scale: Fair Standing balance comment: BUE reliance on RW      Cognition Arousal/Alertness: Awake/alert Behavior During Therapy: WFL for tasks assessed/performed Overall Cognitive Status: Within Functional Limits for tasks assessed      General Comments: Pt was A and oriented x 2. unable to recall any hip precautions. re-educated on precautions and need for SNF at DC to address deficits while returning to PLOF             Pertinent Vitals/Pain Pain Assessment: 0-10 Pain Score: 2  Faces Pain Scale: Hurts a little bit Pain Location: R hip Pain Descriptors / Indicators: Sore;Grimacing Pain Intervention(s): Limited activity within patient's tolerance;Monitored during session;Premedicated before session;Repositioned;Patient requesting pain meds-RN notified     PT Goals (current goals can now be found in the care plan section) Acute Rehab PT Goals Patient Stated Goal: to get better Progress towards  PT goals: Progressing toward goals    Frequency    BID      PT Plan Current plan remains appropriate       AM-PAC PT "6 Clicks" Mobility    Outcome Measure  Help needed turning from your back to your side while in a flat bed without using bedrails?: A Little Help needed moving from lying on your back to sitting on the side of a flat bed without using bedrails?: A Lot Help needed moving to and from a bed to a chair (including a wheelchair)?: A Lot Help needed standing up from a chair using your arms (e.g., wheelchair or bedside chair)?: A Lot Help needed to walk in hospital room?: A Lot Help needed climbing 3-5 steps with a railing? : Total 6 Click Score: 12    End of Session Equipment Utilized During Treatment: Gait belt Activity Tolerance: Patient tolerated treatment well Patient left: with call bell/phone within reach;in chair;with chair alarm set Nurse Communication: Mobility status PT Visit Diagnosis: Unsteadiness on feet (R26.81);Muscle weakness (generalized) (M62.81);History of falling (Z91.81);Pain Pain - Right/Left: Right Pain - part of body: Hip     Time: 1497-0263 PT Time Calculation (min) (ACUTE ONLY): 19 min  Charges:  $Therapeutic Activity: 8-22 mins                    Julaine Fusi PTA 05/24/21, 11:01 AM

## 2021-05-24 NOTE — Progress Notes (Signed)
Physical Therapy Treatment Patient Details Name: Breanna Petty MRN: [id number] DOB: 1941-04-04 Today's Date: 05/24/2021    History of Present Illness Pt is a 80yo F admitted to [facility] on 05/20/21 for mechanical fall that resulted in closed R hip fx. Pt s/p R hemiarthroplasty by Dr. Sharlet Salina on 05/21/21; pt is WBAT on RLE with posterior hip precautions for 35mo. Significant PMH includes: HTN, HLD, DM, COPD, GERD, depression, anxiety, CAD with stent placement, HOH, diverticulitis, sarcoidosis, SBO, CKD (stage IIIa).    PT Comments    Pt was asleep in recliner upon arriving. " You took to long to come back, I'm stiff." Pt is A and oriented x 3 this afternoon. Still could not remember hip precautions. Was agreeable to ambulation as long as she could return to bed afterwards. [name] was able to stand from recliner to [place] with vcs and assistance. Ambulated 25 ft with one occasion of LOB with intervention to prevent fall posteriorly. After gait training, pt requested to return to bed. Gait distances severely limited by fatigue. Once repositioned in bed post session, reviewed there ex/HEP. Pt unwilling to perform 2/2 to fatigue. " I'll do them in awhile after I get some rest." Pt is progressing well with acute PT.She will continue to benefit from skilled PT at a SNF to address deficits while maximizing independence with ADLs.     Follow Up Recommendations  SNF     Equipment Recommendations  Rolling walker with 5" wheels;3in1 (PT)    Recommendations for Other Services       Precautions / Restrictions Precautions Precautions: Fall;Posterior Hip Precaution Booklet Issued: Yes (comment) Restrictions Weight Bearing Restrictions: Yes RLE Weight Bearing: Weight bearing as tolerated    Mobility  Bed Mobility Overal bed mobility: Needs Assistance Bed Mobility: Sit to Supine     Supine to sit: Max assist Sit to supine: Max assist   General bed mobility comments: max assist to progress BLEs into  bed    Transfers Overall transfer level: Needs assistance Equipment used: Rolling walker (2 wheeled) Transfers: Sit to/from Stand Sit to Stand: Mod assist         General transfer comment: Mod assist to stand from lower recliner surface height. Vcs for handplacement, sliding hips to edge of chair, and maintaining proper hip precautions  Ambulation/Gait Ambulation/Gait assistance: Min assist Gait Distance (Feet): 25 Feet Assistive device: Rolling walker (2 wheeled) Gait Pattern/deviations: Antalgic;Step-to pattern;Decreased stance time - right;Decreased step length - left Gait velocity: decreased   General Gait Details: Pt was able to ambulate 25 ft with RW with one occasion of LOB with intervention to prevent fall.    Balance Overall balance assessment: Needs assistance Sitting-balance support: Bilateral upper extremity supported;Feet supported Sitting balance-Leahy Scale: Good     Standing balance support: Bilateral upper extremity supported;During functional activity Standing balance-Leahy Scale: Poor Standing balance comment: Is high fall risk. Had LOB with intervention required to prevent fall      Cognition Arousal/Alertness: Awake/alert Behavior During Therapy: WFL for tasks assessed/performed Overall Cognitive Status: Within Functional Limits for tasks assessed        General Comments: Pt continues to be unable to recall hip precautions but was agreeable to 2nd PM session      Exercises Other Exercises Other Exercises:  (pt requested to use BR during sesison. RN aware)        Pertinent Vitals/Pain Pain Assessment:  (" sore.") Pain Score: 2  Faces Pain Scale: Hurts a little bit Pain Location: R hip  Pain Descriptors / Indicators: Sore;Grimacing Pain Intervention(s): Limited activity within patient's tolerance;Monitored during session;Repositioned     PT Goals (current goals can now be found in the care plan section) Acute Rehab PT Goals Patient Stated  Goal: rehab then home Progress towards PT goals: Progressing toward goals    Frequency    BID      PT Plan Current plan remains appropriate       AM-PAC PT "6 Clicks" Mobility   Outcome Measure  Help needed turning from your back to your side while in a flat bed without using bedrails?: A Little Help needed moving from lying on your back to sitting on the side of a flat bed without using bedrails?: A Lot Help needed moving to and from a bed to a chair (including a wheelchair)?: A Lot Help needed standing up from a chair using your arms (e.g., wheelchair or bedside chair)?: A Lot Help needed to walk in hospital room?: A Lot Help needed climbing 3-5 steps with a railing? : A Lot 6 Click Score: 13    End of Session Equipment Utilized During Treatment: Gait belt Activity Tolerance: Patient tolerated treatment well Patient left: in bed;with call bell/phone within reach;with bed alarm set;with SCD's reapplied Nurse Communication: Mobility status PT Visit Diagnosis: Unsteadiness on feet (R26.81);Muscle weakness (generalized) (M62.81);History of falling (Z91.81);Pain Pain - Right/Left: Right Pain - part of body: Hip     Time: 1350-1414 PT Time Calculation (min) (ACUTE ONLY): 24 min  Charges:  $Gait Training: 8-22 mins $Therapeutic Activity: 8-22 mins                    Julaine Fusi PTA 05/24/21, 2:33 PM

## 2021-05-24 NOTE — Progress Notes (Signed)
Patient ID: Breanna Petty, female   DOB: 01-04-1941, 80 y.o.    PROGRESS NOTE    Breanna Petty  BPZ:[id number] DOB: 08/04/1941 DOA: 05/20/2021 PCP: Donnamarie Rossetti, PA-C    Brief Narrative:  Breanna Petty is a 80 y.o. female with medical history significant of hypertension, hyperlipidemia, diabetes mellitus, COPD not on oxygen, GERD, depression, anxiety, CAD with stent placement, hard of hearing, diverticulitis, sarcoidosis, small bowel obstruction, CKD stage IIIa, who presents with fall and right hip pain.     Pt states that she fell down 2 steps accidentally when she tripped her steps and lost balance at home. No LOC.  Denies head or neck injury.  Developed right hip pain, which is constant, sharp, 10 out of 10 severity, nonradiating.  No leg numbness or weakness.  Patient denies any chest pain, cough, shortness breath.  No fever or chills.  Denies nausea vomiting, diarrhea or abdominal pain.  No symptoms of UTI.   7/7 status post hemiarthroplasty of right hip on 7/6 by Dr. Sharlet Salina.  Pain controlled.  Patient has no complaints 7/8-reports felt nauseas and lightheaded when she started walking a little with PT this am. No sob or  cp. Discussed possible had vasovagal sx.   7/9 c/o constipation, last BM 5 days ago.mild pain at rt hip with ambulation, not at rest  Consultants:  orthopedics  Procedures:   Antimicrobials:      Subjective: No sob or abd pain  Objective: Vitals:   05/23/21 1526 05/23/21 2008 05/24/21 0133 05/24/21 0454  BP: (!) 125/53 (!) 117/54  129/62  Pulse: 97 100  98  Resp: 20 16  16   Temp: 98.8 F (37.1 C) 98.1 F (36.7 C)  99 F (37.2 C)  TempSrc:      SpO2: 100% 100%  100%  Weight:   74.4 kg   Height:        Intake/Output Summary (Last 24 hours) at 05/24/2021 0815 Last data filed at 05/23/2021 1005 Gross per 24 hour  Intake 720 ml  Output --  Net 720 ml   Filed Weights   05/20/21 1001 05/21/21 1423 05/24/21 0133  Weight: 73.5 kg 73.5 kg  74.4 kg    Examination: NAD, calm CTA no wheeze rales rhonchi's  Regular S1-S2 no gallops  soft benign positive bowel sounds No edema Awake alert and oriented x4 Mood and affect appropriate in current setting   Data Reviewed: I have personally reviewed following labs and imaging studies  CBC: Recent Labs  Lab 05/20/21 1004 05/21/21 0433 05/23/21 0625  WBC 9.4 8.2 10.9*  NEUTROABS 6.5  --   --   HGB 11.6* 11.8* 10.9*  HCT 34.7* 35.1* 32.0*  MCV 93.8 91.4 91.2  PLT 192 188 242   Basic Metabolic Panel: Recent Labs  Lab 05/20/21 1004 05/23/21 0625 05/24/21 0501  NA 135 134* 132*  K 4.1 4.4 4.2  CL 100 104 102  CO2 28 25 26   GLUCOSE 128* 157* 166*  BUN 18 19 34*  CREATININE 1.05* 0.96 1.25*  CALCIUM 9.6 9.0 8.9   GFR: Estimated Creatinine Clearance: 40.8 mL/min (A) (by C-G formula based on SCr of 1.25 mg/dL (H)). Liver Function Tests: Recent Labs  Lab 05/20/21 1004  AST 26  ALT 16  ALKPHOS 55  BILITOT 0.9  PROT 7.0  ALBUMIN 4.1   No results for input(s): LIPASE, AMYLASE in the last 168 hours. No results for input(s): AMMONIA in the last 168 hours. Coagulation Profile: Recent  Labs  Lab 05/20/21 1004  INR 1.0   Cardiac Enzymes: No results for input(s): CKTOTAL, CKMB, CKMBINDEX, TROPONINI in the last 168 hours. BNP (last 3 results) No results for input(s): PROBNP in the last 8760 hours. HbA1C: No results for input(s): HGBA1C in the last 72 hours. CBG: Recent Labs  Lab 05/22/21 2054 05/23/21 0742 05/23/21 1218 05/23/21 1649 05/23/21 2042  GLUCAP 186* 248* 196* 174* 155*   Lipid Profile: No results for input(s): CHOL, HDL, LDLCALC, TRIG, CHOLHDL, LDLDIRECT in the last 72 hours. Thyroid Function Tests: No results for input(s): TSH, T4TOTAL, FREET4, T3FREE, THYROIDAB in the last 72 hours. Anemia Panel: No results for input(s): VITAMINB12, FOLATE, FERRITIN, TIBC, IRON, RETICCTPCT in the last 72 hours. Sepsis Labs: No results for input(s):  PROCALCITON, LATICACIDVEN in the last 168 hours.  Recent Results (from the past 240 hour(s))  SARS CORONAVIRUS 2 (TAT 6-24 HRS) Nasopharyngeal Nasopharyngeal Swab     Status: None   Collection Time: 05/17/21 12:56 PM   Specimen: Nasopharyngeal Swab  Result Value Ref Range Status   SARS Coronavirus 2 NEGATIVE NEGATIVE Final    Comment: (NOTE) SARS-CoV-2 target nucleic acids are NOT DETECTED.  The SARS-CoV-2 RNA is generally detectable in upper and lower respiratory specimens during the acute phase of infection. Negative results do not preclude SARS-CoV-2 infection, do not rule out co-infections with other pathogens, and should not be used as the sole basis for treatment or other patient management decisions. Negative results must be combined with clinical observations, patient history, and epidemiological information. The expected result is Negative.  Fact Sheet for Patients: SugarRoll.be  Fact Sheet for Healthcare Providers: https://www.woods-mathews.com/  This test is not yet approved or cleared by the [place] FDA and  has been authorized for detection and/or diagnosis of SARS-CoV-2 by FDA under an Emergency Use Authorization (EUA). This EUA will remain  in effect (meaning this test can be used) for the duration of the COVID-19 declaration under Se ction 564(b)(1) of the Act, 21 U.S.C. section 360bbb-3(b)(1), unless the authorization is terminated or revoked sooner.  Performed at [facility], [place] [address], [place], [place] [address]   Resp Panel by RT-PCR (Flu A&B, Covid) Nasopharyngeal Swab     Status: None   Collection Time: 05/20/21 10:31 AM   Specimen: Nasopharyngeal Swab; Nasopharyngeal(NP) swabs in vial transport medium  Result Value Ref Range Status   SARS Coronavirus 2 by RT PCR NEGATIVE NEGATIVE Final    Comment: (NOTE) SARS-CoV-2 target nucleic acids are NOT DETECTED.  The SARS-CoV-2 RNA is generally  detectable in upper respiratory specimens during the acute phase of infection. The lowest concentration of SARS-CoV-2 viral copies this assay can detect is 138 copies/mL. A negative result does not preclude SARS-Cov-2 infection and should not be used as the sole basis for treatment or other patient management decisions. A negative result may occur with  improper specimen collection/handling, submission of specimen other than nasopharyngeal swab, presence of viral mutation(s) within the areas targeted by this assay, and inadequate number of viral copies(<138 copies/mL). A negative result must be combined with clinical observations, patient history, and epidemiological information. The expected result is Negative.  Fact Sheet for Patients:  EntrepreneurPulse.com.au  Fact Sheet for Healthcare Providers:  IncredibleEmployment.be  This test is no t yet approved or cleared by the [place] FDA and  has been authorized for detection and/or diagnosis of SARS-CoV-2 by FDA under an Emergency Use Authorization (EUA). This EUA will remain  in effect (meaning this  test can be used) for the duration of the COVID-19 declaration under Section 564(b)(1) of the Act, 21 U.S.C.section 360bbb-3(b)(1), unless the authorization is terminated  or revoked sooner.       Influenza A by PCR NEGATIVE NEGATIVE Final   Influenza B by PCR NEGATIVE NEGATIVE Final    Comment: (NOTE) The Xpert Xpress SARS-CoV-2/FLU/RSV plus assay is intended as an aid in the diagnosis of influenza from Nasopharyngeal swab specimens and should not be used as a sole basis for treatment. Nasal washings and aspirates are unacceptable for Xpert Xpress SARS-CoV-2/FLU/RSV testing.  Fact Sheet for Patients: EntrepreneurPulse.com.au  Fact Sheet for Healthcare Providers: IncredibleEmployment.be  This test is not yet approved or cleared by the [place] FDA  and has been authorized for detection and/or diagnosis of SARS-CoV-2 by FDA under an Emergency Use Authorization (EUA). This EUA will remain in effect (meaning this test can be used) for the duration of the COVID-19 declaration under Section 564(b)(1) of the Act, 21 U.S.C. section 360bbb-3(b)(1), unless the authorization is terminated or revoked.  Performed at [facility], [address], [place], [place] [address]   Surgical PCR screen     Status: None   Collection Time: 05/20/21  4:52 PM   Specimen: Nasal Mucosa; Nasal Swab  Result Value Ref Range Status   MRSA, PCR NEGATIVE NEGATIVE Final   Staphylococcus aureus NEGATIVE NEGATIVE Final    Comment: (NOTE) The Xpert SA Assay (FDA approved for NASAL specimens in patients 43 years of age and older), is one component of a comprehensive surveillance program. It is not intended to diagnose infection nor to guide or monitor treatment. Performed at [facility], [address] [place], [place] [address]          Radiology Studies: No results found.      Scheduled Meds:  amLODipine  5 mg Oral Daily   aspirin EC  81 mg Oral Daily   enoxaparin (LOVENOX) injection  40 mg Subcutaneous Q24H   insulin aspart  0-5 Units Subcutaneous QHS   insulin aspart  0-9 Units Subcutaneous TID WC   insulin glargine  3 Units Subcutaneous Daily   latanoprost  1 drop Both Eyes QHS   losartan  50 mg Oral Daily   mirabegron ER  50 mg Oral Daily   multivitamin with minerals  1 tablet Oral Daily   mupirocin ointment  1 application Nasal BID   pantoprazole  40 mg Oral BID   pravastatin  80 mg Oral Daily   Continuous Infusions:  sodium chloride 10 mL/hr at 05/22/21 0602    Assessment & Plan:   Principal Problem:   Closed right hip fracture (HCC) Active Problems:   Anxiety and depression   Dyslipidemia   Gastro-esophageal reflux disease without esophagitis   Chronic obstructive pulmonary disease (HCC)   CAD  (coronary artery disease)   HTN (hypertension)   Type II diabetes mellitus with renal manifestations ([place])   CKD (chronic kidney disease), stage IIIa   Fall at home, initial encounter   Closed right hip fracture ([place]):  As evidenced by x-ray. Patient has moderate pain now. No neurovascular compromise.  Ortho following  status post hemiarthroplasty on 7/6 by Dr. Jerrye Bushy RLE, posterior hip precautions x3 months Follow-up in orthopedics clinic in 2 weeks for staple removal and x-ray Dressing to remain in place until follow-up in clinic  7/9 PT OT recommend SNF For DVT prophylaxis start Lovenox on postop day 1, continue for 2 weeks Add  lidocaine patch to hip   Constipation Will start aggressive bowel regimen Start MiraLAX daily  Senokot twice daily Fleet enema as needed   Anxiety and depression Continue Ativan   Dyslipidemia Continue pravastatin   Gastro-esophageal reflux disease without esophagitis Continue Protonix   Chronic obstructive pulmonary disease ([place]): Stable -Bronchodilators as needed   CAD (coronary artery disease): S/p of stent placement the patient does not have any chest pain or shortness breath. -As needed nitroglycerin -Lipitor -Continue aspirin, but will skip tomorrow for surgery, and then restart on 7/7 if ok by surgery   HTN (hypertension) Stable -Amlodipine, Cozaar Dc iv hydralazine.  If bp needs more control without iv hydralazine, will increase amlodipine   Type II diabetes mellitus with renal manifestations [facility]): Recent A1c 6.7, well controlled.  Patient taking Januvia at home. -Sliding scale insulin   CKD (chronic kidney disease), stage IIIa: Close to baseline.  Baseline creatinine 0.96-1.0 recently.  Her creatinine is 1.05, BUN 18. 7/8 creatinine 0.96.  Stable   Fall at home, initial encounter: -Follow-up CT head -->negative -pt/ot when able to   DVT prophylaxis: Lovenox Code Status: Full Family Communication: none at  bedside Disposition Plan:  Status is: Inpatient  Remains inpatient appropriate because: Unsafe discharge Dispo: The patient is from: Home              Anticipated d/c is to: SNF              Patient currently is medically stable   Difficult to place patient No     Awaiting SNF bed      LOS: 4 days   Time spent: 35 minutes with more than 50% on [place], MD [facility] Pager [phone]  If 7PM-7AM, please contact night-coverage 05/24/2021, 8:15 AM

## 2021-05-25 DIAGNOSIS — S72001S Fracture of unspecified part of neck of right femur, sequela: Secondary | ICD-10-CM | POA: Diagnosis not present

## 2021-05-25 DIAGNOSIS — N1831 Chronic kidney disease, stage 3a: Secondary | ICD-10-CM | POA: Diagnosis not present

## 2021-05-25 DIAGNOSIS — F419 Anxiety disorder, unspecified: Secondary | ICD-10-CM | POA: Diagnosis not present

## 2021-05-25 DIAGNOSIS — I2510 Atherosclerotic heart disease of native coronary artery without angina pectoris: Secondary | ICD-10-CM | POA: Diagnosis not present

## 2021-05-25 LAB — CBC
HCT: 30.5 % — ABNORMAL LOW (ref 36.0–46.0)
Hemoglobin: 10 g/dL — ABNORMAL LOW (ref 12.0–15.0)
MCH: 31.3 pg (ref 26.0–34.0)
MCHC: 32.8 g/dL (ref 30.0–36.0)
MCV: 95.3 fL (ref 80.0–100.0)
Platelets: 212 10*3/uL (ref 150–400)
RBC: 3.2 MIL/uL — ABNORMAL LOW (ref 3.87–5.11)
RDW: 14.3 % (ref 11.5–15.5)
WBC: 7.5 10*3/uL (ref 4.0–10.5)
nRBC: 0 % (ref 0.0–0.2)

## 2021-05-25 LAB — GLUCOSE, CAPILLARY
Glucose-Capillary: 166 mg/dL — ABNORMAL HIGH (ref 70–99)
Glucose-Capillary: 211 mg/dL — ABNORMAL HIGH (ref 70–99)
Glucose-Capillary: 211 mg/dL — ABNORMAL HIGH (ref 70–99)
Glucose-Capillary: 192 mg/dL — ABNORMAL HIGH (ref 70–99)
Glucose-Capillary: 137 mg/dL — ABNORMAL HIGH (ref 70–99)

## 2021-05-25 LAB — BASIC METABOLIC PANEL
Anion gap: 8 (ref 5–15)
BUN: 29 mg/dL — ABNORMAL HIGH (ref 8–23)
CO2: 26 mmol/L (ref 22–32)
Calcium: 9 mg/dL (ref 8.9–10.3)
Chloride: 100 mmol/L (ref 98–111)
Creatinine, Ser: 0.92 mg/dL (ref 0.44–1.00)
GFR, Estimated: >60 mL/min (ref >60)
Glucose, Bld: 168 mg/dL — ABNORMAL HIGH (ref 70–99)
Potassium: 4.2 mmol/L (ref 3.5–5.1)
Sodium: 134 mmol/L — ABNORMAL LOW (ref 135–145)

## 2021-05-25 NOTE — TOC Progression Note (Signed)
Transition of Care [facility]) - Progression Note    Patient Details  Name: Breanna Petty MRN: [id number] Date of Birth: 11/01/1941  Transition of Care [facility]) CM/SW Contact  Izola Price, RN Phone Number: 05/25/2021, 2:16 PM  Clinical Narrative:  Still only one SNF acceptance. Attempted to reach out to son via room phone and his cell phone. Left VM on Cell phone to call back. Communicated this and bed acceptance to provider. Simmie Davies RN CM           Expected Discharge Plan and Services                                                 Social Determinants of Health (SDOH) Interventions    Readmission Risk Interventions No flowsheet data found.

## 2021-05-25 NOTE — Progress Notes (Signed)
Subjective:  POD #4 s/p right hip hemiarthroplasty.   Patient reports denies any significant right hip pain.  Objective:   VITALS:   Vitals:   05/24/21 2036 05/25/21 0510 05/25/21 0740 05/25/21 1123  BP: (!) 128/58 133/61 136/61 (!) 116/58  Pulse: 92 (!) 105 85 (!) 102  Resp: 18 18 18 17   Temp: 99.4 F (37.4 C) 98.2 F (36.8 C) 98.1 F (36.7 C) 99.4 F (37.4 C)  TempSrc:  Oral    SpO2: 99% 98% 98% 98%  Weight:      Height:        PHYSICAL EXAM: Right lower extremity: Neurovascular intact Sensation intact distally Intact pulses distally Dorsiflexion/Plantar flexion intact Aquacel dressing has a small amount of old serosanguineous drainage.  No evidence of acute bleeding. No cellulitis present Right thigh and lower leg compartments soft and compressible  LABS  Results for orders placed or performed during the hospital encounter of 05/20/21 (from the past 24 hour(s))  Glucose, capillary     Status: Abnormal   Collection Time: 05/24/21  4:55 PM  Result Value Ref Range   Glucose-Capillary 170 (H) 70 - 99 mg/dL  Glucose, capillary     Status: Abnormal   Collection Time: 05/24/21 10:37 PM  Result Value Ref Range   Glucose-Capillary 159 (H) 70 - 99 mg/dL  Basic metabolic panel     Status: Abnormal   Collection Time: 05/25/21  6:17 AM  Result Value Ref Range   Sodium 134 (L) 135 - 145 mmol/L   Potassium 4.2 3.5 - 5.1 mmol/L   Chloride 100 98 - 111 mmol/L   CO2 26 22 - 32 mmol/L   Glucose, Bld 168 (H) 70 - 99 mg/dL   BUN 29 (H) 8 - 23 mg/dL   Creatinine, Ser 0.92 0.44 - 1.00 mg/dL   Calcium 9.0 8.9 - 10.3 mg/dL   GFR, Estimated >60 >60 mL/min   Anion gap 8 5 - 15  CBC     Status: Abnormal   Collection Time: 05/25/21  6:18 AM  Result Value Ref Range   WBC 7.5 4.0 - 10.5 K/uL   RBC 3.20 (L) 3.87 - 5.11 MIL/uL   Hemoglobin 10.0 (L) 12.0 - 15.0 g/dL   HCT 30.5 (L) 36.0 - 46.0 %   MCV 95.3 80.0 - 100.0 fL   MCH 31.3 26.0 - 34.0 pg   MCHC 32.8 30.0 - 36.0 g/dL    RDW 14.3 11.5 - 15.5 %   Platelets 212 150 - 400 K/uL   nRBC 0.0 0.0 - 0.2 %  Glucose, capillary     Status: Abnormal   Collection Time: 05/25/21  8:03 AM  Result Value Ref Range   Glucose-Capillary 166 (H) 70 - 99 mg/dL  Glucose, capillary     Status: Abnormal   Collection Time: 05/25/21 11:38 AM  Result Value Ref Range   Glucose-Capillary 211 (H) 70 - 99 mg/dL  Glucose, capillary     Status: Abnormal   Collection Time: 05/25/21 12:10 PM  Result Value Ref Range   Glucose-Capillary 211 (H) 70 - 99 mg/dL    No results found.  Assessment/Plan: 4 Days Post-Op   Principal Problem:   Closed right hip fracture (HCC) Active Problems:   Anxiety and depression   Dyslipidemia   Gastro-esophageal reflux disease without esophagitis   Chronic obstructive pulmonary disease (HCC)   CAD (coronary artery disease)   HTN (hypertension)   Type II diabetes mellitus with renal manifestations (HCC)   CKD (  chronic kidney disease), stage IIIa   Fall at home, initial encounter  Patient doing well postop from an orthopedic standpoint.  Patient is progressing with physical therapy.  She is weightbearing as tolerated with posterior hip precautions on the right lower extremity for 3 months postop.  Patient need to be discharged to a skilled nursing facility from an orthopedic standpoint when she is cleared medically. She will follow-up with Dr. Sharlet Salina in the office in 10 to 14 days.  Discharge to skilled nursing facility on 40 mg of Lovenox daily or enteric-coated aspirin 325 mg p.o. twice daily until follow-up with Dr. Sharlet Salina.   Thornton Park , MD 05/25/2021, 1:47 PM

## 2021-05-25 NOTE — Progress Notes (Signed)
Physical Therapy Treatment Patient Details Name: Breanna Petty MRN: [id number] DOB: 1941/07/22 Today's Date: 05/25/2021    History of Present Illness Pt is a 80yo F admitted to [facility] on 05/20/21 for mechanical fall that resulted in closed R hip fx. Pt s/p R hemiarthroplasty by Dr. Sharlet Salina on 05/21/21; pt is WBAT on RLE with posterior hip precautions for 34mo. Significant PMH includes: HTN, HLD, DM, COPD, GERD, depression, anxiety, CAD with stent placement, HOH, diverticulitis, sarcoidosis, SBO, CKD (stage IIIa).    PT Comments    Pt ready for session.  Participated in exercises as described below.  Pt to EOB with mod a x 1 with heavy cues for hand placements.  Stood with min a x 1 and able to step to recliner with min a x 1 and RW.  Medium BM conveyed to tech and care provided.  She is able to then continue gait in room with RW and min a x 1 to door and back.  Remains in chair after session and elects to have LE's dependant at this time.  Call bell in reach and encouraged to call tech when she needs/wants legs elevated.  Voiced understanding.   Pt remains motivated to increase mobility and decreased assist.   Follow Up Recommendations  SNF     Equipment Recommendations  Rolling walker with 5" wheels;3in1 (PT)    Recommendations for Other Services       Precautions / Restrictions Precautions Precautions: Fall;Posterior Hip Precaution Booklet Issued: Yes (comment) Restrictions Weight Bearing Restrictions: Yes RLE Weight Bearing: Weight bearing as tolerated    Mobility  Bed Mobility Overal bed mobility: Needs Assistance Bed Mobility: Sit to Supine     Supine to sit: Mod assist     General bed mobility comments: Cues for hand placements    Transfers Overall transfer level: Needs assistance Equipment used: Rolling walker (2 wheeled) Transfers: Sit to/from Stand Sit to Stand: Min assist            Ambulation/Gait Ambulation/Gait assistance: Herbalist  (Feet): 25 Feet Assistive device: Rolling walker (2 wheeled) Gait Pattern/deviations: Antalgic;Step-to pattern;Decreased stance time - right;Decreased step length - left Gait velocity: decreased   General Gait Details: to commode then door and back with RW and min guard/assist.  no LOB today   Stairs             Wheelchair Mobility    Modified Rankin (Stroke Patients Only)       Balance Overall balance assessment: Needs assistance Sitting-balance support: Bilateral upper extremity supported;Feet supported Sitting balance-Leahy Scale: Good     Standing balance support: Bilateral upper extremity supported;During functional activity Standing balance-Leahy Scale: Poor Standing balance comment: no LOB today but relies heavily on RW for gait/standing                            Cognition Arousal/Alertness: Awake/alert Behavior During Therapy: WFL for tasks assessed/performed Overall Cognitive Status: Within Functional Limits for tasks assessed                                        Exercises Total Joint Exercises Gluteal Sets: AROM;Strengthening;10 reps;Supine Short Arc Quad: AAROM;Strengthening;Right;10 reps;Supine Heel Slides: AAROM;Strengthening;Right;10 reps;Supine Hip ABduction/ADduction: AAROM;Strengthening;Right;10 reps;Supine (hip ABduction) Straight Leg Raises: AAROM;Strengthening;Right;10 reps;Supine    General Comments        Pertinent Vitals/Pain Pain  Assessment: Faces Faces Pain Scale: Hurts a little bit Pain Location: R hip Pain Descriptors / Indicators: Sore;Grimacing Pain Intervention(s): Limited activity within patient's tolerance;Monitored during session;Repositioned    Home Living                      Prior Function            PT Goals (current goals can now be found in the care plan section) Progress towards PT goals: Progressing toward goals    Frequency    BID      PT Plan Current plan  remains appropriate    Co-evaluation              AM-PAC PT "6 Clicks" Mobility   Outcome Measure  Help needed turning from your back to your side while in a flat bed without using bedrails?: A Little Help needed moving from lying on your back to sitting on the side of a flat bed without using bedrails?: A Lot Help needed moving to and from a bed to a chair (including a wheelchair)?: A Little Help needed standing up from a chair using your arms (e.g., wheelchair or bedside chair)?: A Little Help needed to walk in hospital room?: A Little Help needed climbing 3-5 steps with a railing? : A Lot 6 Click Score: 16    End of Session Equipment Utilized During Treatment: Gait belt Activity Tolerance: Patient tolerated treatment well Patient left: in bed;with call bell/phone within reach;with bed alarm set;with SCD's reapplied Nurse Communication: Mobility status PT Visit Diagnosis: Unsteadiness on feet (R26.81);Muscle weakness (generalized) (M62.81);History of falling (Z91.81);Pain Pain - Right/Left: Right Pain - part of body: Hip     Time: 4315-4008 PT Time Calculation (min) (ACUTE ONLY): 26 min  Charges:  $Gait Training: 8-22 mins $Therapeutic Activity: 8-22 mins                    Chesley Noon, PTA 05/25/21, 10:03 AM , 10:00 AM

## 2021-05-25 NOTE — Plan of Care (Signed)
  Problem: Education: Goal: Knowledge of General Education information will improve Description: Including pain rating scale, medication(s)/side effects and non-pharmacologic comfort measures 05/25/2021 1733 by Kerney Elbe, LPN Outcome: Progressing 05/25/2021 1732 by Kerney Elbe, LPN Outcome: Progressing   Problem: Health Behavior/Discharge Planning: Goal: Ability to manage health-related needs will improve 05/25/2021 1733 by Kerney Elbe, LPN Outcome: Progressing 05/25/2021 1732 by Kerney Elbe, LPN Outcome: Progressing   Problem: Clinical Measurements: Goal: Ability to maintain clinical measurements within normal limits will improve 05/25/2021 1733 by Kerney Elbe, LPN Outcome: Progressing 05/25/2021 1732 by Kerney Elbe, LPN Outcome: Progressing Goal: Will remain free from infection 05/25/2021 1733 by Kerney Elbe, LPN Outcome: Progressing 05/25/2021 1732 by Kerney Elbe, LPN Outcome: Progressing Goal: Diagnostic test results will improve 05/25/2021 1733 by Kerney Elbe, LPN Outcome: Progressing 05/25/2021 1732 by Kerney Elbe, LPN Outcome: Progressing Goal: Respiratory complications will improve 05/25/2021 1733 by Kerney Elbe, LPN Outcome: Progressing 05/25/2021 1732 by Kerney Elbe, LPN Outcome: Progressing Goal: Cardiovascular complication will be avoided 05/25/2021 1733 by Kerney Elbe, LPN Outcome: Progressing 05/25/2021 1732 by Kerney Elbe, LPN Outcome: Progressing   Problem: Activity: Goal: Risk for activity intolerance will decrease 05/25/2021 1733 by Kerney Elbe, LPN Outcome: Progressing 05/25/2021 1732 by Kerney Elbe, LPN Outcome: Progressing   Problem: Nutrition: Goal: Adequate nutrition will be maintained 05/25/2021 1733 by Kerney Elbe, LPN Outcome: Progressing 05/25/2021 1732 by Kerney Elbe, LPN Outcome: Progressing   Problem: Coping: Goal: Level  of anxiety will decrease 05/25/2021 1733 by Kerney Elbe, LPN Outcome: Progressing 05/25/2021 1732 by Kerney Elbe, LPN Outcome: Progressing   Problem: Elimination: Goal: Will not experience complications related to bowel motility 05/25/2021 1733 by Kerney Elbe, LPN Outcome: Progressing 05/25/2021 1732 by Kerney Elbe, LPN Outcome: Progressing Goal: Will not experience complications related to urinary retention 05/25/2021 1733 by Kerney Elbe, LPN Outcome: Progressing 05/25/2021 1732 by Kerney Elbe, LPN Outcome: Progressing   Problem: Pain Managment: Goal: General experience of comfort will improve Outcome: Progressing   Problem: Safety: Goal: Ability to remain free from injury will improve Outcome: Progressing   Problem: Skin Integrity: Goal: Risk for impaired skin integrity will decrease Outcome: Progressing

## 2021-05-25 NOTE — Progress Notes (Signed)
Patient ID: Breanna Petty, female   DOB: 03/13/1941, 80 y.o.    PROGRESS NOTE    BROOKLINN LONGBOTTOM  UDJ:[id number] DOB: 09-Dec-1940 DOA: 05/20/2021 PCP: Donnamarie Rossetti, PA-C    Brief Narrative:  Breanna Petty is a 80 y.o. female with medical history significant of hypertension, hyperlipidemia, diabetes mellitus, COPD not on oxygen, GERD, depression, anxiety, CAD with stent placement, hard of hearing, diverticulitis, sarcoidosis, small bowel obstruction, CKD stage IIIa, who presents with fall and right hip pain.     Pt states that she fell down 2 steps accidentally when she tripped her steps and lost balance at home. No LOC.  Denies head or neck injury.  Developed right hip pain, which is constant, sharp, 10 out of 10 severity, nonradiating.  No leg numbness or weakness.  Patient denies any chest pain, cough, shortness breath.  No fever or chills.  Denies nausea vomiting, diarrhea or abdominal pain.  No symptoms of UTI.   7/7 status post hemiarthroplasty of right hip on 7/6 by Dr. Sharlet Salina.  Pain controlled.  Patient has no complaints 7/8-reports felt nauseas and lightheaded when she started walking a little with PT this am. No sob or  cp. Discussed possible had vasovagal sx.   7/9 c/o constipation, last BM 5 days ago.mild pain at rt hip with ambulation, not at rest 7/10 patient reports doing well today.  Had a bowel movement this AM.  No shortness of breath, hip pain, or chest pain.    Consultants:  orthopedics  Procedures:   Antimicrobials:      Subjective: No nausea or vomiting  Objective: Vitals:   05/24/21 1521 05/24/21 2036 05/25/21 0510 05/25/21 0740  BP: (!) 123/49 (!) 128/58 133/61 136/61  Pulse: 87 92 (!) 105 85  Resp: 16 18 18 18   Temp: 99 F (37.2 C) 99.4 F (37.4 C) 98.2 F (36.8 C) 98.1 F (36.7 C)  TempSrc:   Oral   SpO2: 100% 99% 98% 98%  Weight:      Height:        Intake/Output Summary (Last 24 hours) at 05/25/2021 5885 Last data filed at  05/24/2021 1339 Gross per 24 hour  Intake 560 ml  Output --  Net 560 ml   Filed Weights   05/20/21 1001 05/21/21 1423 05/24/21 0133  Weight: 73.5 kg 73.5 kg 74.4 kg    Examination: NAD, calm CTA no wheeze rales rhonchi's Regular S1-S2 no gallops Soft benign positive bowel sounds No edema AAA times O x4 grossly intact  Data Reviewed: I have personally reviewed following labs and imaging studies  CBC: Recent Labs  Lab 05/20/21 1004 05/21/21 0433 05/23/21 0625  WBC 9.4 8.2 10.9*  NEUTROABS 6.5  --   --   HGB 11.6* 11.8* 10.9*  HCT 34.7* 35.1* 32.0*  MCV 93.8 91.4 91.2  PLT 192 188 027   Basic Metabolic Panel: Recent Labs  Lab 05/20/21 1004 05/23/21 0625 05/24/21 0501 05/25/21 0617  NA 135 134* 132* 134*  K 4.1 4.4 4.2 4.2  CL 100 104 102 100  CO2 28 25 26 26   GLUCOSE 128* 157* 166* 168*  BUN 18 19 34* 29*  CREATININE 1.05* 0.96 1.25* 0.92  CALCIUM 9.6 9.0 8.9 9.0   GFR: Estimated Creatinine Clearance: 55.4 mL/min (by C-G formula based on SCr of 0.92 mg/dL). Liver Function Tests: Recent Labs  Lab 05/20/21 1004  AST 26  ALT 16  ALKPHOS 55  BILITOT 0.9  PROT 7.0  ALBUMIN 4.1  No results for input(s): LIPASE, AMYLASE in the last 168 hours. No results for input(s): AMMONIA in the last 168 hours. Coagulation Profile: Recent Labs  Lab 05/20/21 1004  INR 1.0   Cardiac Enzymes: No results for input(s): CKTOTAL, CKMB, CKMBINDEX, TROPONINI in the last 168 hours. BNP (last 3 results) No results for input(s): PROBNP in the last 8760 hours. HbA1C: No results for input(s): HGBA1C in the last 72 hours. CBG: Recent Labs  Lab 05/24/21 0840 05/24/21 1209 05/24/21 1655 05/24/21 2237 05/25/21 0803  GLUCAP 212* 175* 170* 159* 166*   Lipid Profile: No results for input(s): CHOL, HDL, LDLCALC, TRIG, CHOLHDL, LDLDIRECT in the last 72 hours. Thyroid Function Tests: No results for input(s): TSH, T4TOTAL, FREET4, T3FREE, THYROIDAB in the last 72  hours. Anemia Panel: No results for input(s): VITAMINB12, FOLATE, FERRITIN, TIBC, IRON, RETICCTPCT in the last 72 hours. Sepsis Labs: No results for input(s): PROCALCITON, LATICACIDVEN in the last 168 hours.  Recent Results (from the past 240 hour(s))  SARS CORONAVIRUS 2 (TAT 6-24 HRS) Nasopharyngeal Nasopharyngeal Swab     Status: None   Collection Time: 05/17/21 12:56 PM   Specimen: Nasopharyngeal Swab  Result Value Ref Range Status   SARS Coronavirus 2 NEGATIVE NEGATIVE Final    Comment: (NOTE) SARS-CoV-2 target nucleic acids are NOT DETECTED.  The SARS-CoV-2 RNA is generally detectable in upper and lower respiratory specimens during the acute phase of infection. Negative results do not preclude SARS-CoV-2 infection, do not rule out co-infections with other pathogens, and should not be used as the sole basis for treatment or other patient management decisions. Negative results must be combined with clinical observations, patient history, and epidemiological information. The expected result is Negative.  Fact Sheet for Patients: SugarRoll.be  Fact Sheet for Healthcare Providers: https://www.woods-mathews.com/  This test is not yet approved or cleared by the [place] FDA and  has been authorized for detection and/or diagnosis of SARS-CoV-2 by FDA under an Emergency Use Authorization (EUA). This EUA will remain  in effect (meaning this test can be used) for the duration of the COVID-19 declaration under Se ction 564(b)(1) of the Act, 21 U.S.C. section 360bbb-3(b)(1), unless the authorization is terminated or revoked sooner.  Performed at [facility], [place] [address], [place], [place] [address]   Resp Panel by RT-PCR (Flu A&B, Covid) Nasopharyngeal Swab     Status: None   Collection Time: 05/20/21 10:31 AM   Specimen: Nasopharyngeal Swab; Nasopharyngeal(NP) swabs in vial transport medium  Result Value Ref Range Status    SARS Coronavirus 2 by RT PCR NEGATIVE NEGATIVE Final    Comment: (NOTE) SARS-CoV-2 target nucleic acids are NOT DETECTED.  The SARS-CoV-2 RNA is generally detectable in upper respiratory specimens during the acute phase of infection. The lowest concentration of SARS-CoV-2 viral copies this assay can detect is 138 copies/mL. A negative result does not preclude SARS-Cov-2 infection and should not be used as the sole basis for treatment or other patient management decisions. A negative result may occur with  improper specimen collection/handling, submission of specimen other than nasopharyngeal swab, presence of viral mutation(s) within the areas targeted by this assay, and inadequate number of viral copies(<138 copies/mL). A negative result must be combined with clinical observations, patient history, and epidemiological information. The expected result is Negative.  Fact Sheet for Patients:  EntrepreneurPulse.com.au  Fact Sheet for Healthcare Providers:  IncredibleEmployment.be  This test is no t yet approved or cleared by the [place] FDA and  has been  authorized for detection and/or diagnosis of SARS-CoV-2 by FDA under an Emergency Use Authorization (EUA). This EUA will remain  in effect (meaning this test can be used) for the duration of the COVID-19 declaration under Section 564(b)(1) of the Act, 21 U.S.C.section 360bbb-3(b)(1), unless the authorization is terminated  or revoked sooner.       Influenza A by PCR NEGATIVE NEGATIVE Final   Influenza B by PCR NEGATIVE NEGATIVE Final    Comment: (NOTE) The Xpert Xpress SARS-CoV-2/FLU/RSV plus assay is intended as an aid in the diagnosis of influenza from Nasopharyngeal swab specimens and should not be used as a sole basis for treatment. Nasal washings and aspirates are unacceptable for Xpert Xpress SARS-CoV-2/FLU/RSV testing.  Fact Sheet for  Patients: EntrepreneurPulse.com.au  Fact Sheet for Healthcare Providers: IncredibleEmployment.be  This test is not yet approved or cleared by the [place] FDA and has been authorized for detection and/or diagnosis of SARS-CoV-2 by FDA under an Emergency Use Authorization (EUA). This EUA will remain in effect (meaning this test can be used) for the duration of the COVID-19 declaration under Section 564(b)(1) of the Act, 21 U.S.C. section 360bbb-3(b)(1), unless the authorization is terminated or revoked.  Performed at [facility], [address], [place], [place] [address]   Surgical PCR screen     Status: None   Collection Time: 05/20/21  4:52 PM   Specimen: Nasal Mucosa; Nasal Swab  Result Value Ref Range Status   MRSA, PCR NEGATIVE NEGATIVE Final   Staphylococcus aureus NEGATIVE NEGATIVE Final    Comment: (NOTE) The Xpert SA Assay (FDA approved for NASAL specimens in patients 66 years of age and older), is one component of a comprehensive surveillance program. It is not intended to diagnose infection nor to guide or monitor treatment. Performed at [facility], [address], [place],  [address]          Radiology Studies: No results found.      Scheduled Meds:  amLODipine  5 mg Oral Daily   aspirin EC  81 mg Oral Daily   enoxaparin (LOVENOX) injection  40 mg Subcutaneous Q24H   insulin aspart  0-5 Units Subcutaneous QHS   insulin aspart  0-9 Units Subcutaneous TID WC   insulin glargine  3 Units Subcutaneous Daily   latanoprost  1 drop Both Eyes QHS   losartan  50 mg Oral Daily   mirabegron ER  50 mg Oral Daily   multivitamin with minerals  1 tablet Oral Daily   mupirocin ointment  1 application Nasal BID   pantoprazole  40 mg Oral BID   polyethylene glycol  17 g Oral Daily   pravastatin  80 mg Oral Daily   senna-docusate  1 tablet Oral BID   Continuous Infusions:  sodium chloride 10  mL/hr at 05/22/21 0602    Assessment & Plan:   Principal Problem:   Closed right hip fracture (HCC) Active Problems:   Anxiety and depression   Dyslipidemia   Gastro-esophageal reflux disease without esophagitis   Chronic obstructive pulmonary disease (HCC)   CAD (coronary artery disease)   HTN (hypertension)   Type II diabetes mellitus with renal manifestations ([place])   CKD (chronic kidney disease), stage IIIa   Fall at home, initial encounter   Closed right hip fracture ([place]):  As evidenced by x-ray. Patient has moderate pain now. No neurovascular compromise.  Ortho following  status post hemiarthroplasty on 7/6 by Dr. Jerrye Bushy RLE, posterior hip precautions x3 months  Follow-up in orthopedics clinic in 2 weeks for staple removal and x-ray Dressing to remain in place until follow-up in clinic  7/10 PT OT recommend SNF  For DVT prophylaxis started Lovenox on postop day 1, continue for 2 weeks  Lidocaine patch to hip for pain  Cbc stable    Constipation Was started on aggressive bowel regimen Had BM today We will continue to monitor   Anxiety and depression Continue Ativan   Dyslipidemia Continue pravastatin   Gastro-esophageal reflux disease without esophagitis Continue Protonix   Chronic obstructive pulmonary disease ([place]): Stable -Bronchodilators as needed   CAD (coronary artery disease): S/p of stent placement the patient does not have any chest pain or shortness breath. -As needed nitroglycerin -Lipitor Continue aspirin   HTN (hypertension) Stable -Amlodipine, Cozaar  If bp needs more control without iv hydralazine, will increase amlodipine   Type II diabetes mellitus with renal manifestations ([place]): Recent A1c 6.7, well controlled.  Patient taking Januvia at home. -Sliding scale insulin   CKD (chronic kidney disease), stage IIIa: Close to baseline.  Baseline creatinine 0.96-1.0 recently.  Her creatinine is 1.05, BUN 18. 7/8 creatinine 0.96.   Stable   Fall at home, initial encounter: -Follow-up CT head -->negative -pt/ot when able to   DVT prophylaxis: Lovenox Code Status: Full Family Communication: none at bedside Disposition Plan:  Status is: Inpatient  Remains inpatient appropriate because: Unsafe discharge Dispo: The patient is from: Home              Anticipated d/c is to: SNF pending              Patient currently is medically stable   Difficult to place patient No     Awaiting SNF bed      LOS: 5 days   Time spent: 35 minutes with more than 50% on [place], MD [facility] Pager [phone]  If 7PM-7AM, please contact night-coverage 05/25/2021, 8:22 AM

## 2021-05-26 ENCOUNTER — Other Ambulatory Visit: Payer: Self-pay

## 2021-05-26 DIAGNOSIS — N1831 Chronic kidney disease, stage 3a: Secondary | ICD-10-CM | POA: Diagnosis not present

## 2021-05-26 DIAGNOSIS — F419 Anxiety disorder, unspecified: Secondary | ICD-10-CM | POA: Diagnosis not present

## 2021-05-26 DIAGNOSIS — S72001S Fracture of unspecified part of neck of right femur, sequela: Secondary | ICD-10-CM | POA: Diagnosis not present

## 2021-05-26 DIAGNOSIS — I2510 Atherosclerotic heart disease of native coronary artery without angina pectoris: Secondary | ICD-10-CM | POA: Diagnosis not present

## 2021-05-26 LAB — CBC
HCT: 31.3 % — ABNORMAL LOW (ref 36.0–46.0)
Hemoglobin: 10.4 g/dL — ABNORMAL LOW (ref 12.0–15.0)
MCH: 30.9 pg (ref 26.0–34.0)
MCHC: 33.2 g/dL (ref 30.0–36.0)
MCV: 92.9 fL (ref 80.0–100.0)
Platelets: 230 10*3/uL (ref 150–400)
RBC: 3.37 MIL/uL — ABNORMAL LOW (ref 3.87–5.11)
RDW: 13.9 % (ref 11.5–15.5)
WBC: 7.7 10*3/uL (ref 4.0–10.5)
nRBC: 0 % (ref 0.0–0.2)

## 2021-05-26 LAB — GLUCOSE, CAPILLARY
Glucose-Capillary: 172 mg/dL — ABNORMAL HIGH (ref 70–99)
Glucose-Capillary: 159 mg/dL — ABNORMAL HIGH (ref 70–99)
Glucose-Capillary: 197 mg/dL — ABNORMAL HIGH (ref 70–99)
Glucose-Capillary: 175 mg/dL — ABNORMAL HIGH (ref 70–99)

## 2021-05-26 MED ORDER — AMLODIPINE BESYLATE 5 MG PO TABS
2.5000 mg | ORAL_TABLET | Freq: Every day | ORAL | Status: DC
  Administered 2021-05-26 – 2021-05-29 (×4): 2.5 mg via ORAL
  Filled 2021-05-26 (×4): qty 1

## 2021-05-26 NOTE — Progress Notes (Signed)
Physical Therapy Treatment Patient Details Name: Breanna Petty MRN: [id number] DOB: 06/18/1941 Today's Date: 05/26/2021    History of Present Illness Pt is a 80yo F admitted to [facility] on [facility]/20/21 for mechanical fall that resulted in closed R hip fx. Pt s/p R hemiarthroplasty by Dr. Sharlet Salina on 05/21/21; pt is WBAT on RLE with posterior hip precautions for 28mo. Significant PMH includes: HTN, HLD, DM, COPD, GERD, depression, anxiety, CAD with stent placement, HOH, diverticulitis, sarcoidosis, SBO, CKD (stage IIIa).    PT Comments    Pt resting in bed upon PT arrival; agreeable to PT session; requesting to also toilet prior to walking.  Min assist with bed mobility; min assist with transfers; and CGA to min assist to ambulate 50 feet with RW.  Pt requiring intermittent vc's for posterior THP's/positioning during activities.  Decreased cadence noted with ambulation.  Pt reporting no pain during sessions activities and appearing motivated to participate in therapy.  Will continue to focus on strengthening and progressive functional mobility per pt tolerance.    Follow Up Recommendations  SNF     Equipment Recommendations  Rolling walker with 5" wheels;3in1 (PT)    Recommendations for Other Services       Precautions / Restrictions Precautions Precautions: Fall;Posterior Hip Precaution Booklet Issued: Yes (comment) Restrictions Weight Bearing Restrictions: Yes RLE Weight Bearing: Weight bearing as tolerated    Mobility  Bed Mobility Overal bed mobility: Needs Assistance Bed Mobility: Supine to Sit;Sit to Supine     Supine to sit: Min assist;HOB elevated Sit to supine: Min assist;HOB elevated   General bed mobility comments: assist for R LE    Transfers Overall transfer level: Needs assistance Equipment used: Rolling walker (2 wheeled) Transfers: Sit to/from [facility] Sit to Stand: Min assist Stand pivot transfers: Min guard;Min assist (stand step turn bed to  [facility] with RW)       General transfer comment: vc's for UE/LE placement, posterior THP's, and overall technique  Ambulation/Gait Ambulation/Gait assistance: Min guard;Min assist Gait Distance (Feet): 50 Feet Assistive device: Rolling walker (2 wheeled)   Gait velocity: decreased   General Gait Details: decreased stance time R LE; initial vc's for improved positioning within walker during ambulation   Stairs             Wheelchair Mobility    Modified Rankin (Stroke Patients Only)       Balance Overall balance assessment: Needs assistance Sitting-balance support: No upper extremity supported;Feet supported Sitting balance-Leahy Scale: Good Sitting balance - Comments: steady sitting reaching within BOS   Standing balance support: Single extremity supported;No upper extremity supported Standing balance-Leahy Scale: Fair Standing balance comment: min assist for standing balance                            Cognition Arousal/Alertness: Awake/alert Behavior During Therapy: WFL for tasks assessed/performed Overall Cognitive Status: Within Functional Limits for tasks assessed                                 General Comments: Able to recall 2/3 posterior THP's      Exercises      General Comments General comments (skin integrity, edema, etc.): Mild drainage noted R LE hip/thigh dressing.      Pertinent Vitals/Pain Pain Assessment: No/denies pain Pain Intervention(s): Limited activity within patient's tolerance;Monitored during session;Repositioned Vitals (HR and O2 on room air) stable  and WFL throughout treatment session.    Home Living                      Prior Function            PT Goals (current goals can now be found in the care plan section) Acute Rehab PT Goals Patient Stated Goal: rehab then home PT Goal Formulation: With patient Time For Goal Achievement: 06/05/21 Potential to Achieve Goals: Good Progress  towards PT goals: Progressing toward goals    Frequency    BID      PT Plan Current plan remains appropriate    Co-evaluation              AM-PAC PT "6 Clicks" Mobility   Outcome Measure  Help needed turning from your back to your side while in a flat bed without using bedrails?: A Little Help needed moving from lying on your back to sitting on the side of a flat bed without using bedrails?: A Little Help needed moving to and from a bed to a chair (including a wheelchair)?: A Little Help needed standing up from a chair using your arms (e.g., wheelchair or bedside chair)?: A Little Help needed to walk in hospital room?: A Little Help needed climbing 3-5 steps with a railing? : A Lot 6 Click Score: 17    End of Session Equipment Utilized During Treatment: Gait belt Activity Tolerance: Patient tolerated treatment well Patient left: in bed;with bed alarm set;with call bell/phone within reach;Other (comment) (B heels floating via pillow support; pillow between pt's knees for posterior THP's) Nurse Communication: Mobility status;Precautions;Weight bearing status PT Visit Diagnosis: Unsteadiness on feet (R26.81);Muscle weakness (generalized) (M62.81);History of falling (Z91.81);Pain Pain - Right/Left: Right Pain - part of body: Hip     Time: 7353-2992 PT Time Calculation (min) (ACUTE ONLY): 39 min  Charges:  $Gait Training: 8-22 mins $Therapeutic Activity: 23-37 mins                     Leitha Bleak, PT 05/26/21, 3:56 PM

## 2021-05-26 NOTE — TOC Progression Note (Signed)
Transition of Care [facility]) - Progression Note    Patient Details  Name: Breanna Petty MRN: [id number] Date of Birth: 09-Feb-1941  [facility]) CM/SW Contact  Su Hilt, RN Phone Number: 05/26/2021, 4:32 PM  Clinical Narrative:     Jeralene Huff out to [place] at [phone], started ins auth pending [id number], faxed clinical notes to [phone]       Expected Discharge Plan and Services                                                 Social Determinants of Health (SDOH) Interventions    Readmission Risk Interventions No flowsheet data found.

## 2021-05-26 NOTE — Plan of Care (Signed)
  Problem: Health Behavior/Discharge Planning: Goal: Ability to manage health-related needs will improve Outcome: Progressing   

## 2021-05-26 NOTE — Progress Notes (Signed)
Patient ID: Breanna Petty, female   DOB: [birth year], 80 y.o.    PROGRESS NOTE    Breanna Petty  LFY:[id number] DOB: [birth year] DOA: 05/20/2021 PCP: Breanna Rossetti, PA-C    Brief Narrative:  Breanna Petty is a 80 y.o. female with medical history significant of hypertension, hyperlipidemia, diabetes mellitus, COPD not on oxygen, GERD, depression, anxiety, CAD with stent placement, hard of hearing, diverticulitis, sarcoidosis, small bowel obstruction, CKD stage IIIa, who presents with fall and right hip pain.     Pt states that she fell down 2 steps accidentally when she tripped her steps and lost balance at home. No LOC.  Denies head or neck injury.  Developed right hip pain, which is constant, sharp, 10 out of 10 severity, nonradiating.  No leg numbness or weakness.  Patient denies any chest pain, cough, shortness breath.  No fever or chills.  Denies nausea vomiting, diarrhea or abdominal pain.  No symptoms of UTI.   7/7 status post hemiarthroplasty of right hip on 7/6 by Dr. Sharlet Salina.  Pain controlled.  Patient has no complaints 7/8-reports felt nauseas and lightheaded when she started walking a little with PT this am. No sob or  cp. Discussed possible had vasovagal sx.   7/9 c/o constipation, last BM 5 days ago.mild pain at rt hip with ambulation, not at rest 7/10 patient reports doing well today.  Had a bowel movement this AM.  No shortness of breath, hip pain, or chest pain.  7/11- doing well today. +bm, more loose. No pain this am  Consultants:  orthopedics  Procedures:   Antimicrobials:      Subjective: No sob, cp  Objective: Vitals:   05/25/21 1958 05/26/21 0434 05/26/21 0744 05/26/21 1119  BP: (!) 130/54 132/60 106/73 123/64  Pulse: 88 82 98 88  Resp: 18 18 16 18   Temp: 99 F (37.2 C) 99.2 F (37.3 C) 99.6 F (37.6 C) 99.4 F (37.4 C)  TempSrc:  Oral Oral   SpO2: 97% 98% 100% 99%  Weight:      Height:        Intake/Output Summary (Last 24 hours) at  05/26/2021 1452 Last data filed at 05/26/2021 1012 Gross per 24 hour  Intake 240 ml  Output --  Net 240 ml   Filed Weights   05/20/21 1001 05/21/21 1423 05/24/21 0133  Weight: 73.5 kg 73.5 kg 74.4 kg    Examination: NAD, calm CTA no wheeze rales rhonchi's Regular S1-S2 no gallops Soft benign positive bowel sounds AA xO x4, grossly intact Mood and affect appropriate in current setting  Data Reviewed: I have personally reviewed following labs and imaging studies  CBC: Recent Labs  Lab 05/20/21 1004 05/21/21 0433 05/23/21 0625 05/25/21 0618 05/26/21 0840  WBC 9.4 8.2 10.9* 7.5 7.7  NEUTROABS 6.5  --   --   --   --   HGB 11.6* 11.8* 10.9* 10.0* 10.4*  HCT 34.7* 35.1* 32.0* 30.5* 31.3*  MCV 93.8 91.4 91.2 95.3 92.9  PLT 192 188 166 212 852   Basic Metabolic Panel: Recent Labs  Lab 05/20/21 1004 05/23/21 0625 05/24/21 0501 05/25/21 0617  NA 135 134* 132* 134*  K 4.1 4.4 4.2 4.2  CL 100 104 102 100  CO2 28 25 26 26   GLUCOSE 128* 157* 166* 168*  BUN 18 19 34* 29*  CREATININE 1.05* 0.96 1.25* 0.92  CALCIUM 9.6 9.0 8.9 9.0   GFR: Estimated Creatinine Clearance: 55.4 mL/min (by C-G formula based on  SCr of 0.92 mg/dL). Liver Function Tests: Recent Labs  Lab 05/20/21 1004  AST 26  ALT 16  ALKPHOS 55  BILITOT 0.9  PROT 7.0  ALBUMIN 4.1   No results for input(s): LIPASE, AMYLASE in the last 168 hours. No results for input(s): AMMONIA in the last 168 hours. Coagulation Profile: Recent Labs  Lab 05/20/21 1004  INR 1.0   Cardiac Enzymes: No results for input(s): CKTOTAL, CKMB, CKMBINDEX, TROPONINI in the last 168 hours. BNP (last 3 results) No results for input(s): PROBNP in the last 8760 hours. HbA1C: No results for input(s): HGBA1C in the last 72 hours. CBG: Recent Labs  Lab 05/25/21 1210 05/25/21 1621 05/25/21 2051 05/26/21 0743 05/26/21 1121  GLUCAP 211* 192* 137* 172* 159*   Lipid Profile: No results for input(s): CHOL, HDL, LDLCALC, TRIG,  CHOLHDL, LDLDIRECT in the last 72 hours. Thyroid Function Tests: No results for input(s): TSH, T4TOTAL, FREET4, T3FREE, THYROIDAB in the last 72 hours. Anemia Panel: No results for input(s): VITAMINB12, FOLATE, FERRITIN, TIBC, IRON, RETICCTPCT in the last 72 hours. Sepsis Labs: No results for input(s): PROCALCITON, LATICACIDVEN in the last 168 hours.  Recent Results (from the past 240 hour(s))  SARS CORONAVIRUS 2 (TAT 6-24 HRS) Nasopharyngeal Nasopharyngeal Swab     Status: None   Collection Time: 05/17/21 12:56 PM   Specimen: Nasopharyngeal Swab  Result Value Ref Range Status   SARS Coronavirus 2 NEGATIVE NEGATIVE Final    Comment: (NOTE) SARS-CoV-2 target nucleic acids are NOT DETECTED.  The SARS-CoV-2 RNA is generally detectable in upper and lower respiratory specimens during the acute phase of infection. Negative results do not preclude SARS-CoV-2 infection, do not rule out co-infections with other pathogens, and should not be used as the sole basis for treatment or other patient management decisions. Negative results must be combined with clinical observations, patient history, and epidemiological information. The expected result is Negative.  Fact Sheet for Patients: SugarRoll.be  Fact Sheet for Healthcare Providers: https://www.woods-mathews.com/  This test is not yet approved or cleared by the [place] FDA and  has been authorized for detection and/or diagnosis of SARS-CoV-2 by FDA under an Emergency Use Authorization (EUA). This EUA will remain  in effect (meaning this test can be used) for the duration of the COVID-19 declaration under Se ction 564(b)(1) of the Act, 21 U.S.C. section 360bbb-3(b)(1), unless the authorization is terminated or revoked sooner.  Performed at [facility], [place] [address] [place], [place] [address]   Resp Panel by RT-PCR (Flu A&B, Covid) Nasopharyngeal Swab     Status: None    Collection Time: 05/20/21 10:31 AM   Specimen: Nasopharyngeal Swab; Nasopharyngeal(NP) swabs in vial transport medium  Result Value Ref Range Status   SARS Coronavirus 2 by RT PCR NEGATIVE NEGATIVE Final    Comment: (NOTE) SARS-CoV-2 target nucleic acids are NOT DETECTED.  The SARS-CoV-2 RNA is generally detectable in upper respiratory specimens during the acute phase of infection. The lowest concentration of SARS-CoV-2 viral copies this assay can detect is 138 copies/mL. A negative result does not preclude SARS-Cov-2 infection and should not be used as the sole basis for treatment or other patient management decisions. A negative result may occur with  improper specimen collection/handling, submission of specimen other than nasopharyngeal swab, presence of viral mutation(s) within the areas targeted by this assay, and inadequate number of viral copies(<138 copies/mL). A negative result must be combined with clinical observations, patient history, and epidemiological information. The expected result is Negative.  Fact Sheet for Patients:  EntrepreneurPulse.com.au  Fact Sheet for Healthcare Providers:  IncredibleEmployment.be  This test is no t yet approved or cleared by the [place] FDA and  has been authorized for detection and/or diagnosis of SARS-CoV-2 by FDA under an Emergency Use Authorization (EUA). This EUA will remain  in effect (meaning this test can be used) for the duration of the COVID-19 declaration under Section 564(b)(1) of the Act, 21 U.S.C.section 360bbb-3(b)(1), unless the authorization is terminated  or revoked sooner.       Influenza A by PCR NEGATIVE NEGATIVE Final   Influenza B by PCR NEGATIVE NEGATIVE Final    Comment: (NOTE) The Xpert Xpress SARS-CoV-2/FLU/RSV plus assay is intended as an aid in the diagnosis of influenza from Nasopharyngeal swab specimens and should not be used as a sole basis for treatment.  Nasal washings and aspirates are unacceptable for Xpert Xpress SARS-CoV-2/FLU/RSV testing.  Fact Sheet for Patients: EntrepreneurPulse.com.au  Fact Sheet for Healthcare Providers: IncredibleEmployment.be  This test is not yet approved or cleared by the [place] FDA and has been authorized for detection and/or diagnosis of SARS-CoV-2 by FDA under an Emergency Use Authorization (EUA). This EUA will remain in effect (meaning this test can be used) for the duration of the COVID-19 declaration under Section 564(b)(1) of the Act, 21 U.S.C. section 360bbb-3(b)(1), unless the authorization is terminated or revoked.  Performed at [facility], [address], [place], [place] [address]   Surgical PCR screen     Status: None   Collection Time: 05/20/21  4:52 PM   Specimen: Nasal Mucosa; Nasal Swab  Result Value Ref Range Status   MRSA, PCR NEGATIVE NEGATIVE Final   Staphylococcus aureus NEGATIVE NEGATIVE Final    Comment: (NOTE) The Xpert SA Assay (FDA approved for NASAL specimens in patients 67 years of age and older), is one component of a comprehensive surveillance program. It is not intended to diagnose infection nor to guide or monitor treatment. Performed at [place], [address] [place], [place] [address]          Radiology Studies: No results found.      Scheduled Meds:  amLODipine  2.5 mg Oral Daily   aspirin EC  81 mg Oral Daily   enoxaparin (LOVENOX) injection  40 mg Subcutaneous Q24H   insulin aspart  0-5 Units Subcutaneous QHS   insulin aspart  0-9 Units Subcutaneous TID WC   insulin glargine  3 Units Subcutaneous Daily   latanoprost  1 drop Both Eyes QHS   losartan  50 mg Oral Daily   mirabegron ER  50 mg Oral Daily   multivitamin with minerals  1 tablet Oral Daily   pantoprazole  40 mg Oral BID   polyethylene glycol  17 g Oral Daily   pravastatin  80 mg Oral Daily   senna-docusate  1  tablet Oral BID   Continuous Infusions:  sodium chloride 10 mL/hr at 05/22/21 0602    Assessment & Plan:   Principal Problem:   Closed right hip fracture (HCC) Active Problems:   Anxiety and depression   Dyslipidemia   Gastro-esophageal reflux disease without esophagitis   Chronic obstructive pulmonary disease (HCC)   CAD (coronary artery disease)   HTN (hypertension)   Type II diabetes mellitus with renal manifestations ([place])   CKD (chronic kidney disease), stage IIIa   Fall at home, initial encounter   Closed right hip fracture ([place]):  As evidenced by x-ray. Patient has moderate pain  now. No neurovascular compromise.  Ortho following  status post hemiarthroplasty on 7/6 by Dr. Jerrye Bushy RLE, posterior hip precautions x3 months Follow-up in orthopedics clinic in 2 weeks for staple removal and x-ray Dressing to remain in place until follow-up in clinic  PT OT recommend SNF-pending  For DVT prophylaxis started Lovenox on postop day 1, continue for 2 weeks  Lidocaine patch to hip for pain  H&H remains stable postop PT OT recommend SNF     Constipation Resolved Change bowel regimen to as needed as she having more loose stools now   Anxiety and depression Continue Ativan   Dyslipidemia Continue pravastatin   Gastro-esophageal reflux disease without esophagitis Continue Protonix   Chronic obstructive pulmonary disease ([place]): Stable.  No exacerbation. -Bronchodilators as needed   CAD (coronary artery disease): S/p of stent placement the patient does not have any chest pain or shortness breath. -As needed nitroglycerin -Lipitor Continue aspirin   HTN (hypertension) Stable -Amlodipine, Cozaar  If bp needs more control without iv hydralazine, will increase amlodipine   Type II diabetes mellitus with renal manifestations [facility]): Recent A1c 6.7, well controlled.  Patient taking Januvia at home. -Sliding scale insulin   CKD (chronic kidney disease), stage  IIIa: Close to baseline.  Baseline creatinine 0.96-1.0 recently.  Her creatinine is 1.05, BUN 18. 7/8 creatinine 0.96.  Stable   Fall at home, initial encounter: -Follow-up CT head -->negative    DVT prophylaxis: Lovenox Code Status: Full Family Communication: none at bedside Disposition Plan:  Status is: Inpatient  Remains inpatient appropriate because: Unsafe discharge Dispo: The patient is from: Home              Anticipated d/c is to: SNF pending              Patient currently is medically stable   Difficult to place patient No     Awaiting SNF bed      LOS: 6 days   Time spent: 35 minutes with more than 50% on [place], MD [facility] Pager [phone]  If 7PM-7AM, please contact night-coverage 05/26/2021, 2:52 PM

## 2021-05-26 NOTE — TOC Progression Note (Signed)
Transition of Care [facility]) - Progression Note    Patient Details  Name: Breanna Petty MRN: [id number] Date of Birth: 1941-08-28  Transition of Care [facility]) CM/SW [place], RN Phone Number: 05/26/2021, 3:43 PM  Clinical Narrative:      Spoke with the patient's son Jori Moll.  I explained the bed search process and that there is only 1 bed offer and that is [facility] I explained that Medicare.org is where we look at reviews for any of the facilities I explained that being as [facility] is the only facility making an offer if they choose not to go there then the only other option would be to go home, I explained how home health works. He stated that [facility] would be good to accept that, I explained we will get ins approval and go from there       Expected Discharge Plan and Services                                                 Social Determinants of Health (SDOH) Interventions    Readmission Risk Interventions No flowsheet data found.

## 2021-05-26 NOTE — Progress Notes (Signed)
Physical Therapy Treatment Patient Details Name: Breanna Petty MRN: [id number] DOB: Jan 08, 1941 Today's Date: 05/26/2021    History of Present Illness Pt is a 80yo F admitted to [facility] on 05/20/21 for mechanical fall that resulted in closed R hip fx. Pt s/p R hemiarthroplasty by Dr. Sharlet Salina on 05/21/21; pt is WBAT on RLE with posterior hip precautions for 49mo. Significant PMH includes: HTN, HLD, DM, COPD, GERD, depression, anxiety, CAD with stent placement, HOH, diverticulitis, sarcoidosis, SBO, CKD (stage IIIa).    PT Comments    Participated in exercises as described below.  To EOB with rail and min assist.  Stood and is able to progress gait 40' with slow but steady gait with +1 assist for safety.  She is fatigued with task and returns t bed with min assist to reposition.  Stated she has been up in the chair already this morning and had recently returned to bed.  Remains motivated and appropriate for SNF transition when discharged.   Follow Up Recommendations  SNF     Equipment Recommendations  Rolling walker with 5" wheels;3in1 (PT)    Recommendations for Other Services       Precautions / Restrictions Precautions Precautions: Fall;Posterior Hip Precaution Booklet Issued: Yes (comment) Restrictions Weight Bearing Restrictions: Yes RLE Weight Bearing: Weight bearing as tolerated    Mobility  Bed Mobility Overal bed mobility: Needs Assistance Bed Mobility: Sit to Supine;Supine to Sit     Supine to sit: Min assist Sit to supine: Min assist   General bed mobility comments: Cues for hand placements    Transfers Overall transfer level: Needs assistance Equipment used: Rolling walker (2 wheeled) Transfers: Sit to/from Stand Sit to Stand: Min assist         General transfer comment: Mod assist to stand from lower recliner surface height. Vcs for handplacement, sliding hips to edge of chair, and maintaining proper hip precautions  Ambulation/Gait Ambulation/Gait  assistance: Min assist Gait Distance (Feet): 40 Feet Assistive device: Rolling walker (2 wheeled) Gait Pattern/deviations: WFL(Within Functional Limits) Gait velocity: decreased   General Gait Details: slow gait with heavy reliance on RW but generally steady with +1 assist.   Stairs             Wheelchair Mobility    Modified Rankin (Stroke Patients Only)       Balance Overall balance assessment: Needs assistance Sitting-balance support: Bilateral upper extremity supported;Feet supported Sitting balance-Leahy Scale: Good     Standing balance support: Bilateral upper extremity supported Standing balance-Leahy Scale: Fair Standing balance comment: no LOB today but relies heavily on RW for gait/standing                            Cognition Arousal/Alertness: Awake/alert Behavior During Therapy: WFL for tasks assessed/performed Overall Cognitive Status: Within Functional Limits for tasks assessed                                        Exercises Other Exercises Other Exercises: BLE A/AAROM supine x 10    General Comments        Pertinent Vitals/Pain Pain Assessment: Faces Faces Pain Scale: Hurts a little bit Pain Location: R hip Pain Descriptors / Indicators: Sore;Grimacing Pain Intervention(s): Limited activity within patient's tolerance;Monitored during session;Repositioned    Home Living  Prior Function            PT Goals (current goals can now be found in the care plan section) Progress towards PT goals: Progressing toward goals    Frequency    BID      PT Plan Current plan remains appropriate    Co-evaluation              AM-PAC PT "6 Clicks" Mobility   Outcome Measure  Help needed turning from your back to your side while in a flat bed without using bedrails?: A Little Help needed moving from lying on your back to sitting on the side of a flat bed without using bedrails?:  A Lot Help needed moving to and from a bed to a chair (including a wheelchair)?: A Little Help needed standing up from a chair using your arms (e.g., wheelchair or bedside chair)?: A Little Help needed to walk in hospital room?: A Little Help needed climbing 3-5 steps with a railing? : A Lot 6 Click Score: 16    End of Session Equipment Utilized During Treatment: Gait belt Activity Tolerance: Patient tolerated treatment well Patient left: in bed;with bed alarm set;with call bell/phone within reach Nurse Communication: Mobility status PT Visit Diagnosis: Unsteadiness on feet (R26.81);Muscle weakness (generalized) (M62.81);History of falling (Z91.81);Pain Pain - Right/Left: Right Pain - part of body: Hip     Time: 8675-4492 PT Time Calculation (min) (ACUTE ONLY): 17 min  Charges:  $Gait Training: 8-22 mins                    Chesley Noon, PTA 05/26/21, 10:40 AM , 10:38 AM

## 2021-05-26 NOTE — TOC Progression Note (Signed)
Transition of Care [facility]) - Progression Note    Patient Details  Name: Breanna Petty MRN: [id number] Date of Birth: 05-08-1941  Transition of Care [facility]) CM/SW [place], RN Phone Number: 05/26/2021, 9:16 AM  Clinical Narrative:     Met with the patient in the room to discuss Bed offer, She has accepted the only bed offer from [facility], She has Medicare and UHC UMR, I notified Tonya at [facility] and she is going to check the insurance to see which is primary and if Josem Kaufmann is needed. The patient stated her son is at work and she would let him know about going to [facility] when he calls her.        Expected Discharge Plan and Services                                                 Social Determinants of Health (SDOH) Interventions    Readmission Risk Interventions No flowsheet data found.

## 2021-05-27 DIAGNOSIS — S72001S Fracture of unspecified part of neck of right femur, sequela: Secondary | ICD-10-CM | POA: Diagnosis not present

## 2021-05-27 DIAGNOSIS — F419 Anxiety disorder, unspecified: Secondary | ICD-10-CM | POA: Diagnosis not present

## 2021-05-27 DIAGNOSIS — N1831 Chronic kidney disease, stage 3a: Secondary | ICD-10-CM | POA: Diagnosis not present

## 2021-05-27 DIAGNOSIS — I2510 Atherosclerotic heart disease of native coronary artery without angina pectoris: Secondary | ICD-10-CM | POA: Diagnosis not present

## 2021-05-27 LAB — GLUCOSE, CAPILLARY
Glucose-Capillary: 162 mg/dL — ABNORMAL HIGH (ref 70–99)
Glucose-Capillary: 149 mg/dL — ABNORMAL HIGH (ref 70–99)
Glucose-Capillary: 202 mg/dL — ABNORMAL HIGH (ref 70–99)
Glucose-Capillary: 150 mg/dL — ABNORMAL HIGH (ref 70–99)

## 2021-05-27 NOTE — Progress Notes (Signed)
Patient ID: Breanna Petty, female   DOB: September 20, 1941, 80 y.o.    PROGRESS NOTE    Breanna LONGIE  Petty:[id number] DOB: Mar 21, 1941 DOA: 05/20/2021 PCP: Donnamarie Rossetti, PA-C    Brief Narrative:  Breanna Petty is a 80 y.o. female with medical history significant of hypertension, hyperlipidemia, diabetes mellitus, COPD not on oxygen, GERD, depression, anxiety, CAD with stent placement, hard of hearing, diverticulitis, sarcoidosis, small bowel obstruction, CKD stage IIIa, who presents with fall and right hip pain.found with rt hip fx. Needs SNF placement pending.      Consultants:  orthopedics  Procedures:   Antimicrobials:      Subjective: No pain, sob, cp  Objective: Vitals:   05/26/21 2029 05/27/21 0347 05/27/21 0746 05/27/21 1142  BP: (!) 127/53 121/70 119/63 115/68  Pulse: 91 81 79 84  Resp: 16 16 16 16   Temp: 99.7 F (37.6 C) 98.8 F (37.1 C) 98.4 F (36.9 C) 98.4 F (36.9 C)  TempSrc: Oral Oral    SpO2: 97% 98% 100% 99%  Weight:      Height:        Intake/Output Summary (Last 24 hours) at 05/27/2021 1301 Last data filed at 05/27/2021 0243 Gross per 24 hour  Intake --  Output 150 ml  Net -150 ml   Filed Weights   05/20/21 1001 05/21/21 1423 05/24/21 0133  Weight: 73.5 kg 73.5 kg 74.4 kg    Examination: NAD calm CTA no wheeze rales rhonchi Regular S1-S2 no gallops Soft benign positive bowel sounds No edema AA x O x4 Mood and affect appropriate    Data Reviewedhave personally reviewed following labs and imaging studies  CBC: Recent Labs  Lab 05/21/21 0433 05/23/21 0625 05/25/21 0618 05/26/21 0840  WBC 8.2 10.9* 7.5 7.7  HGB 11.8* 10.9* 10.0* 10.4*  HCT 35.1* 32.0* 30.5* 31.3*  MCV 91.4 91.2 95.3 92.9  PLT 188 166 212 993   Basic Metabolic Panel: Recent Labs  Lab 05/23/21 0625 05/24/21 0501 05/25/21 0617  NA 134* 132* 134*  K 4.4 4.2 4.2  CL 104 102 100  CO2 25 26 26   GLUCOSE 157* 166* 168*  BUN 19 34* 29*  CREATININE 0.96  1.25* 0.92  CALCIUM 9.0 8.9 9.0   GFR: Estimated Creatinine Clearance: 55.4 mL/min (by C-G formula based on SCr of 0.92 mg/dL). Liver Function Tests: No results for input(s): AST, ALT, ALKPHOS, BILITOT, PROT, ALBUMIN in the last 168 hours.  No results for input(s): LIPASE, AMYLASE in the last 168 hours. No results for input(s): AMMONIA in the last 168 hours. Coagulation Profile: No results for input(s): INR, PROTIME in the last 168 hours.  Cardiac Enzymes: No results for input(s): CKTOTAL, CKMB, CKMBINDEX, TROPONINI in the last 168 hours. BNP (last 3 results) No results for input(s): PROBNP in the last 8760 hours. HbA1C: No results for input(s): HGBA1C in the last 72 hours. CBG: Recent Labs  Lab 05/26/21 1121 05/26/21 1644 05/26/21 2101 05/27/21 0747 05/27/21 1144  GLUCAP 159* 197* 175* 162* 149*   Lipid Profile: No results for input(s): CHOL, HDL, LDLCALC, TRIG, CHOLHDL, LDLDIRECT in the last 72 hours. Thyroid Function Tests: No results for input(s): TSH, T4TOTAL, FREET4, T3FREE, THYROIDAB in the last 72 hours. Anemia Panel: No results for input(s): VITAMINB12, FOLATE, FERRITIN, TIBC, IRON, RETICCTPCT in the last 72 hours. Sepsis Labs: No results for input(s): PROCALCITON, LATICACIDVEN in the last 168 hours.  Recent Results (from the past 240 hour(s))  Resp Panel by RT-PCR (Flu A&B,  Covid) Nasopharyngeal Swab     Status: None   Collection Time: 05/20/21 10:31 AM   Specimen: Nasopharyngeal Swab; Nasopharyngeal(NP) swabs in vial transport medium  Result Value Ref Range Status   SARS Coronavirus 2 by RT PCR NEGATIVE NEGATIVE Final    Comment: (NOTE) SARS-CoV-2 target nucleic acids are NOT DETECTED.  The SARS-CoV-2 RNA is generally detectable in upper respiratory specimens during the acute phase of infection. The lowest concentration of SARS-CoV-2 viral copies this assay can detect is 138 copies/mL. A negative result does not preclude SARS-Cov-2 infection and should  not be used as the sole basis for treatment or other patient management decisions. A negative result may occur with  improper specimen collection/handling, submission of specimen other than nasopharyngeal swab, presence of viral mutation(s) within the areas targeted by this assay, and inadequate number of viral copies(<138 copies/mL). A negative result must be combined with clinical observations, patient history, and epidemiological information. The expected result is Negative.  Fact Sheet for Patients:  EntrepreneurPulse.com.au  Fact Sheet for Healthcare Providers:  IncredibleEmployment.be  This test is no t yet approved or cleared by the [place] FDA and  has been authorized for detection and/or diagnosis of SARS-CoV-2 by FDA under an Emergency Use Authorization (EUA). This EUA will remain  in effect (meaning this test can be used) for the duration of the COVID-19 declaration under Section 564(b)(1) of the Act, 21 U.S.C.section 360bbb-3(b)(1), unless the authorization is terminated  or revoked sooner.       Influenza A by PCR NEGATIVE NEGATIVE Final   Influenza B by PCR NEGATIVE NEGATIVE Final    Comment: (NOTE) The Xpert Xpress SARS-CoV-2/FLU/RSV plus assay is intended as an aid in the diagnosis of influenza from Nasopharyngeal swab specimens and should not be used as a sole basis for treatment. Nasal washings and aspirates are unacceptable for Xpert Xpress SARS-CoV-2/FLU/RSV testing.  Fact Sheet for Patients: EntrepreneurPulse.com.au  Fact Sheet for Healthcare Providers: IncredibleEmployment.be  This test is not yet approved or cleared by the [place] FDA and has been authorized for detection and/or diagnosis of SARS-CoV-2 by FDA under an Emergency Use Authorization (EUA). This EUA will remain in effect (meaning this test can be used) for the duration of the COVID-19 declaration under  Section 564(b)(1) of the Act, 21 U.S.C. section 360bbb-3(b)(1), unless the authorization is terminated or revoked.  Performed at [facility], [address], [place], [place] [address]   Surgical PCR screen     Status: None   Collection Time: 05/20/21  4:52 PM   Specimen: Nasal Mucosa; Nasal Swab  Result Value Ref Range Status   MRSA, PCR NEGATIVE NEGATIVE Final   Staphylococcus aureus NEGATIVE NEGATIVE Final    Comment: (NOTE) The Xpert SA Assay (FDA approved for NASAL specimens in patients 56 years of age and older), is one component of a comprehensive surveillance program. It is not intended to diagnose infection nor to guide or monitor treatment. Performed at [facility], [address], [place], [place] [address]          Radiology Studies: No results found.      Scheduled Meds:  amLODipine  2.5 mg Oral Daily   aspirin EC  81 mg Oral Daily   enoxaparin (LOVENOX) injection  40 mg Subcutaneous Q24H   insulin aspart  0-5 Units Subcutaneous QHS   insulin aspart  0-9 Units Subcutaneous TID WC   insulin glargine  3 Units Subcutaneous Daily   latanoprost  1 drop Both  Eyes QHS   losartan  50 mg Oral Daily   mirabegron ER  50 mg Oral Daily   multivitamin with minerals  1 tablet Oral Daily   pantoprazole  40 mg Oral BID   polyethylene glycol  17 g Oral Daily   pravastatin  80 mg Oral Daily   Continuous Infusions:  sodium chloride 10 mL/hr at 05/22/21 0602    Assessment & Plan:   Principal Problem:   Closed right hip fracture (HCC) Active Problems:   Anxiety and depression   Dyslipidemia   Gastro-esophageal reflux disease without esophagitis   Chronic obstructive pulmonary disease (HCC)   CAD (coronary artery disease)   HTN (hypertension)   Type II diabetes mellitus with renal manifestations (HCC)   CKD (chronic kidney disease), stage IIIa   Fall at home, initial encounter   Closed right hip fracture ([place]):  As evidenced by x-ray.  Patient has moderate pain now. No neurovascular compromise.  Ortho following  status post hemiarthroplasty on 7/6 by Dr. Jerrye Bushy RLE, posterior hip precautions x3 months Follow-up in orthopedics clinic in 2 weeks for staple removal and x-ray Dressing to remain in place until follow-up in clinic  7/12 for DVT prophylaxis started Lovenox on postop day 1, continue for 2 weeks  Lidocaine patch to hip for pain  Postop H&H remained stable  PT OT recommend SNF-pending      Constipation Resolved Continue with current bowel regimen    Anxiety and depression Continue Ativan   Dyslipidemia Continue pravastatin   Gastro-esophageal reflux disease without esophagitis Continue Protonix   Chronic obstructive pulmonary disease ([place]): Stable.  No exacerbation. -Bronchodilators as needed   CAD (coronary artery disease): S/p of stent placement the patient does not have any chest pain or shortness breath. -As needed nitroglycerin -Lipitor Continue aspirin   HTN (hypertension) Stable -Amlodipine, Cozaar  If bp needs more control without iv hydralazine, will increase amlodipine   Type II diabetes mellitus with renal manifestations [facility]): Recent A1c 6.7, well controlled.  Patient taking Januvia at home. -Sliding scale insulin   CKD (chronic kidney disease), stage IIIa: Close to baseline.  Baseline creatinine 0.96-1.0 recently.  Her creatinine is 1.05, BUN 18. - Stable   Fall at home, initial encounter: -Follow-up CT head -->negative    DVT prophylaxis: Lovenox Code Status: Full Family Communication: none at bedside Disposition Plan:  Status is: Inpatient  Remains inpatient appropriate because: Unsafe discharge Dispo: The patient is from: Home              Anticipated d/c is to: SNF pending              Patient currently is medically stable   Difficult to place patient No     Awaiting SNF bed      LOS: 7 days   Time spent: 35 minutes with more than 50% on  [place], MD [facility] Pager [phone]  If 7PM-7AM, please contact night-coverage 05/27/2021, 1:01 PM

## 2021-05-27 NOTE — Progress Notes (Signed)
Physical Therapy Treatment Patient Details Name: Breanna Petty MRN: [id number] DOB: Apr 23, 1941 Today's Date: 05/27/2021    History of Present Illness Pt is a 80yo F admitted to [facility] on 05/20/21 for mechanical fall that resulted in closed R hip fx. Pt s/p R hemiarthroplasty by Dr. Sharlet Salina on 05/21/21; pt is WBAT on RLE with posterior hip precautions for 56mo. Significant PMH includes: HTN, HLD, DM, COPD, GERD, depression, anxiety, CAD with stent placement, HOH, diverticulitis, sarcoidosis, SBO, CKD (stage IIIa).    PT Comments    Pt resting in bed upon PT arrival; agreeable to PT session.  6-7/10 R hip/thigh pain beginning/during/end of session (pt reports recent pain medication).  Min assist for R LE semi-supine to sitting edge of bed; min assist for transfers; and CGA to min assist to ambulate 60 feet with RW.  Pt requiring intermittent vc's for posterior THP's during sessions activities.  Will continue to focus on strengthening and progressive functional mobility during hospitalization.    Follow Up Recommendations  SNF     Equipment Recommendations  Rolling walker with 5" wheels;3in1 (PT)    Recommendations for Other Services       Precautions / Restrictions Precautions Precautions: Fall;Posterior Hip Precaution Booklet Issued: Yes (comment) Restrictions Weight Bearing Restrictions: Yes RLE Weight Bearing: Weight bearing as tolerated    Mobility  Bed Mobility Overal bed mobility: Needs Assistance Bed Mobility: Supine to Sit     Supine to sit: Min assist;HOB elevated     General bed mobility comments: minimal assist for R LE    Transfers Overall transfer level: Needs assistance Equipment used: Rolling walker (2 wheeled) Transfers: Sit to/from Stand Sit to Stand: Min assist         General transfer comment: vc's for UE/LE placement, posterior THP's, and overall technique  Ambulation/Gait Ambulation/Gait assistance: Min guard;Min assist Gait Distance (Feet): 60  Feet Assistive device: Rolling walker (2 wheeled)   Gait velocity: decreased   General Gait Details: decreased stance time R LE; step to gait pattern   Stairs             Wheelchair Mobility    Modified Rankin (Stroke Patients Only)       Balance Overall balance assessment: Needs assistance Sitting-balance support: No upper extremity supported;Feet supported Sitting balance-Leahy Scale: Good Sitting balance - Comments: steady sitting reaching within BOS   Standing balance support: No upper extremity supported Standing balance-Leahy Scale: Poor Standing balance comment: CGA to min assist for standing balance                            Cognition Arousal/Alertness: Awake/alert Behavior During Therapy: WFL for tasks assessed/performed Overall Cognitive Status: Within Functional Limits for tasks assessed                                 General Comments: Able to recall 1/3 posterior THP's      Exercises Total Joint Exercises Ankle Circles/Pumps: AROM;Strengthening;Both;10 reps;Supine Quad Sets: AROM;Strengthening;Both;10 reps;Supine Gluteal Sets: AROM;Strengthening;Both;10 reps;Supine Towel Squeeze: AROM;Strengthening;Both;10 reps;Supine (pillow between pt's knees) Short Arc Quad: AROM;Strengthening;Right;10 reps;Supine Heel Slides: AAROM;Strengthening;Right;10 reps;Supine Hip ABduction/ADduction: AAROM;Strengthening;Right;10 reps;Supine Straight Leg Raises: AAROM;Strengthening;Right;10 reps;Supine    General Comments General comments (skin integrity, edema, etc.): mild drainage noted R LE hip/thigh dressing.      Pertinent Vitals/Pain Pain Assessment: 0-10 Pain Score: 6  Pain Location: R hip Pain Descriptors / Indicators:  Aching;Sore Pain Intervention(s): Limited activity within patient's tolerance;Monitored during session;Premedicated before session;Repositioned Vitals (HR and O2 on room air) stable and WFL throughout treatment  session.    Home Living                      Prior Function            PT Goals (current goals can now be found in the care plan section) Acute Rehab PT Goals Patient Stated Goal: rehab then home PT Goal Formulation: With patient Time For Goal Achievement: 06/05/21 Potential to Achieve Goals: Good Progress towards PT goals: Progressing toward goals    Frequency    BID      PT Plan Current plan remains appropriate    Co-evaluation              AM-PAC PT "6 Clicks" Mobility   Outcome Measure  Help needed turning from your back to your side while in a flat bed without using bedrails?: A Little Help needed moving from lying on your back to sitting on the side of a flat bed without using bedrails?: A Little Help needed moving to and from a bed to a chair (including a wheelchair)?: A Little Help needed standing up from a chair using your arms (e.g., wheelchair or bedside chair)?: A Little Help needed to walk in hospital room?: A Little Help needed climbing 3-5 steps with a railing? : A Lot 6 Click Score: 17    End of Session Equipment Utilized During Treatment: Gait belt Activity Tolerance: Patient tolerated treatment well Patient left: in chair;with call bell/phone within reach;with chair alarm set;Other (comment) (pillow between pt's knees for posterior THP's; B heels floating via pillow support) Nurse Communication: Mobility status;Precautions;Weight bearing status PT Visit Diagnosis: Unsteadiness on feet (R26.81);Muscle weakness (generalized) (M62.81);History of falling (Z91.81);Pain Pain - Right/Left: Right Pain - part of body: Hip     Time: 0830-0908 PT Time Calculation (min) (ACUTE ONLY): 38 min  Charges:  $Gait Training: 8-22 mins $Therapeutic Exercise: 8-22 mins $Therapeutic Activity: 8-22 mins                    Leitha Bleak, PT 05/27/21, 12:18 PM

## 2021-05-27 NOTE — Progress Notes (Signed)
Nutrition Follow-up  DOCUMENTATION CODES:  Not applicable  INTERVENTION:  Continue Magic Cup TID.  Continue MVI with minerals daily.  NUTRITION DIAGNOSIS:  Increased nutrient needs related to post-op healing, hip fracture as evidenced by estimated needs. - ongoing  GOAL:  Patient will meet greater than or equal to 90% of their needs - meeting  MONITOR:  Diet advancement, PO intake, Supplement acceptance, Labs, Weight trends, I & O's  REASON FOR ASSESSMENT:  Consult Hip fracture protocol  ASSESSMENT:  80 yo female with a PMH of HTN, HLD, T2DM, COPD not on oxygen, GERD, depression, anxiety, CAD s/p stent placement, hard of hearing, diverticulitis, sarcoidosis, and CKD stage 3a who presents with R hip fracture.  Pending discharge - awaiting SNF placement.  Per Epic, pt eating well 85-100% of meals.  Admit wt: 73.5 kg Current wt: 74.4 kg  Medications: reviewed; SSI, mealtime Novolog, Lantus, MVI with minerals, Protonix BID, mirala  Labs: reviewed; CBG 149-197 (H)  Diet Order:   Diet Order             Diet heart healthy/carb modified Room service appropriate? Yes; Fluid consistency: Thin  Diet effective now                  EDUCATION NEEDS:  Education needs have been addressed  Skin:  Skin Assessment: Skin Integrity Issues: Skin Integrity Issues:: Incisions Incisions: R hip, closed  Last BM:  05/26/21 - Type 4, large  Height:  Ht Readings from Last 1 Encounters:  05/21/21 5\' 11"  (1.803 m)   Weight:  Wt Readings from Last 1 Encounters:  05/24/21 74.4 kg   BMI:  Body mass index is 22.88 kg/m.  Estimated Nutritional Needs:  Kcal:  1850-2050 Protein:  85-100 grams Fluid:  >1.85 L  Derrel Nip, RD, LDN (she/her/hers) Registered Dietitian I After-Hours/Weekend Pager # in [place]

## 2021-05-27 NOTE — Progress Notes (Signed)
Physical Therapy Treatment Patient Details Name: Breanna Petty MRN: [id number] DOB: 1940-11-17 Today's Date: 05/27/2021    History of Present Illness Pt is a 80yo F admitted to [facility] on 05/20/21 for mechanical fall that resulted in closed R hip fx. Pt s/p R hemiarthroplasty by Dr. Sharlet Salina on 05/21/21; pt is WBAT on RLE with posterior hip precautions for 63mo. Significant PMH includes: HTN, HLD, DM, COPD, GERD, depression, anxiety, CAD with stent placement, HOH, diverticulitis, sarcoidosis, SBO, CKD (stage IIIa).    PT Comments     Pt was long sitting in bed. At first resistive however once motivated was cooperative and pleasant throughout. Pt is frustrated with insurance company not already approving rehab at [place]. Pt does continue to require min assist to perform bed mobility and transfers. She tolerated ambulation 30 ft with CGA prior to returning to bed and performing HEP. Overall pt continues to improve but will benefit from SNF at [place] to assist pt to PLOF. She was repositioned in bed with call bell in reach and RN aware of pt's abilities.    Follow Up Recommendations  SNF     Equipment Recommendations  Rolling walker with 5" wheels;3in1 (PT)       Precautions / Restrictions Precautions Precautions: Fall;Posterior Hip Precaution Booklet Issued: Yes (comment) Restrictions Weight Bearing Restrictions: Yes RLE Weight Bearing: Weight bearing as tolerated    Mobility  Bed Mobility Overal bed mobility: Needs Assistance Bed Mobility: Supine to Sit     Supine to sit: Min assist;HOB elevated Sit to supine: Min assist;HOB elevated   General bed mobility comments: minimal assist for R LE    Transfers Overall transfer level: Needs assistance Equipment used: Rolling walker (2 wheeled) Transfers: Sit to/from Stand Sit to Stand: Min assist         General transfer comment: Pt stood from EOB to RW with min assist + vcs for improved technique  Ambulation/Gait Ambulation/Gait  assistance: Min guard Gait Distance (Feet): 30 Feet Assistive device: Rolling walker (2 wheeled) Gait Pattern/deviations: WFL(Within Functional Limits) Gait velocity: decreased   General Gait Details: decreased stance time R LE; step to gait pattern. distance limited by wet hallway floors. returned to bed and performed ther ex in bed.      Balance Overall balance assessment: Needs assistance Sitting-balance support: No upper extremity supported;Feet supported Sitting balance-Leahy Scale: Good Sitting balance - Comments: steady sitting reaching within BOS   Standing balance support: Bilateral upper extremity supported;During functional activity Standing balance-Leahy Scale: Fair Standing balance comment: CGA to min assist for standing balance      Cognition Arousal/Alertness: Awake/alert Behavior During Therapy: WFL for tasks assessed/performed Overall Cognitive Status: Within Functional Limits for tasks assessed      General Comments: Pt was A and O x 4. Slightly frustrated about insurance dragging there approval.      Exercises Total Joint Exercises Ankle Circles/Pumps: AROM;Strengthening;Both;10 reps;Supine Quad Sets: AROM;Strengthening;Both;10 reps;Supine Gluteal Sets: AROM;Strengthening;Both;10 reps;Supine Towel Squeeze: AROM;Strengthening;Both;10 reps;Supine Short Arc Quad: AROM;Strengthening;Right;10 reps;Supine Heel Slides: AAROM;Strengthening;Right;10 reps;Supine Hip ABduction/ADduction: AAROM;Strengthening;Right;10 reps;Supine Straight Leg Raises: AROM;Right;10 reps    General Comments General comments (skin integrity, edema, etc.): mild drainage noted R LE hip/thigh dressing      Pertinent Vitals/Pain Pain Assessment: 0-10 Pain Score: 2  Faces Pain Scale: Hurts a little bit Pain Location: R hip Pain Descriptors / Indicators: Sore Pain Intervention(s): Limited activity within patient's tolerance;Monitored during session;Premedicated before  session;Repositioned     PT Goals (current goals can now be found in the  care plan section) Acute Rehab PT Goals Patient Stated Goal: rehab then home PT Goal Formulation: With patient Time For Goal Achievement: 06/05/21 Potential to Achieve Goals: Good Progress towards PT goals: Progressing toward goals    Frequency    BID      PT Plan Current plan remains appropriate       AM-PAC PT "6 Clicks" Mobility   Outcome Measure  Help needed turning from your back to your side while in a flat bed without using bedrails?: A Little Help needed moving from lying on your back to sitting on the side of a flat bed without using bedrails?: A Little Help needed moving to and from a bed to a chair (including a wheelchair)?: A Little Help needed standing up from a chair using your arms (e.g., wheelchair or bedside chair)?: A Little Help needed to walk in hospital room?: A Little Help needed climbing 3-5 steps with a railing? : A Lot 6 Click Score: 17    End of Session Equipment Utilized During Treatment: Gait belt Activity Tolerance: Patient tolerated treatment well Patient left: in chair;with call bell/phone within reach;with chair alarm set;Other (comment) Nurse Communication: Mobility status;Precautions;Weight bearing status PT Visit Diagnosis: Unsteadiness on feet (R26.81);Muscle weakness (generalized) (M62.81);History of falling (Z91.81);Pain Pain - Right/Left: Right Pain - part of body: Hip     Time: 1352-1416 PT Time Calculation (min) (ACUTE ONLY): 24 min  Charges:  $Gait Training: 8-22 mins $Therapeutic Exercise: 8-22 mins $Therapeutic Activity: 8-22 mins                     Julaine Fusi PTA 05/27/21, 2:27 PM

## 2021-05-27 NOTE — TOC Progression Note (Addendum)
Transition of Care [facility]) - Progression Note    Patient Details  Name: Breanna Petty MRN: [id number] Date of Birth: [birth year]  Transition of Care [facility]) CM/SW Contact  Su Hilt, RN Phone Number: 05/27/2021, 9:44 AM  Clinical Narrative:      Robynn Pane [facility] to check the status of the insurance authorization [id number], the care manager that answered the phone stated that their system is down and they are unable to check the status of any authorization at this time, she recommended calling back in an hour or 2   Update, called back to [facility] to check status of auth, to go to [facility], Auth is still Pending It needs to be reviewed by the Nurse they will make a decision and let me know of the approval    Expected Discharge Plan and Services                                                 Social Determinants of Health (SDOH) Interventions    Readmission Risk Interventions No flowsheet data found.

## 2021-05-28 DIAGNOSIS — F419 Anxiety disorder, unspecified: Secondary | ICD-10-CM | POA: Diagnosis not present

## 2021-05-28 DIAGNOSIS — F32A Depression, unspecified: Secondary | ICD-10-CM | POA: Diagnosis not present

## 2021-05-28 DIAGNOSIS — S72001D Fracture of unspecified part of neck of right femur, subsequent encounter for closed fracture with routine healing: Secondary | ICD-10-CM | POA: Diagnosis not present

## 2021-05-28 LAB — GLUCOSE, CAPILLARY
Glucose-Capillary: 145 mg/dL — ABNORMAL HIGH (ref 70–99)
Glucose-Capillary: 162 mg/dL — ABNORMAL HIGH (ref 70–99)
Glucose-Capillary: 120 mg/dL — ABNORMAL HIGH (ref 70–99)
Glucose-Capillary: 174 mg/dL — ABNORMAL HIGH (ref 70–99)

## 2021-05-28 NOTE — Progress Notes (Signed)
Physical Therapy Treatment Patient Details Name: Breanna Petty MRN: [id number] DOB: 11-11-1941 Today's Date: 05/28/2021    History of Present Illness Pt is a 80yo F admitted to [facility] on 05/20/21 for mechanical fall that resulted in closed R hip fx. Pt s/p R hemiarthroplasty by Dr. Sharlet Salina on 05/21/21; pt is WBAT on RLE with posterior hip precautions for 63mo. Significant PMH includes: HTN, HLD, DM, COPD, GERD, depression, anxiety, CAD with stent placement, HOH, diverticulitis, sarcoidosis, SBO, CKD (stage IIIa).    PT Comments    Pt was supine in bed upon arriving. Agrees to session and is cooperative throughout. Continues to require assistance with bed mobility and transfers. Was able to tolerate ambulation with RW but does fatigue quickly. Once returned to bed, perform there ex handout with cueing. Acute PT still feels pt is appropriate for rehab due to lack of assistance at home. Will benefit from SNF at DC to address deficits with strength, mobility, balance, and overall safety with ADLs. Acute PT will continue to follow closely.    Follow Up Recommendations  SNF     Equipment Recommendations  Rolling walker with 5" wheels;3in1 (PT)       Precautions / Restrictions Precautions Precautions: Fall;Posterior Hip Precaution Booklet Issued: Yes (comment) Restrictions Weight Bearing Restrictions: Yes RLE Weight Bearing: Weight bearing as tolerated    Mobility  Bed Mobility Overal bed mobility: Needs Assistance Bed Mobility: Supine to Sit;Sit to Supine     Supine to sit: Min assist;HOB elevated Sit to supine: Min assist;HOB elevated        Transfers Overall transfer level: Needs assistance Equipment used: Rolling walker (2 wheeled) Transfers: Sit to/from Stand Sit to Stand: Min guard         General transfer comment: CGA for safety.  Ambulation/Gait Ambulation/Gait assistance: Min guard Gait Distance (Feet): 70 Feet Assistive device: Rolling walker (2 wheeled) Gait  Pattern/deviations: Step-to pattern Gait velocity: decreased   General Gait Details: Pt was able to ambulate 70 ft with RW with slow step to pattern.     Balance Overall balance assessment: Needs assistance Sitting-balance support: No upper extremity supported;Feet supported Sitting balance-Leahy Scale: Good     Standing balance support: Bilateral upper extremity supported;During functional activity Standing balance-Leahy Scale: Fair Standing balance comment: steady walking with B UE support on RW        Cognition Arousal/Alertness: Awake/alert Behavior During Therapy: WFL for tasks assessed/performed Overall Cognitive Status: Within Functional Limits for tasks assessed      General Comments: Pt was A and O x 4 and extremely agreeable to session      Exercises Total Joint Exercises Ankle Circles/Pumps: AROM;Strengthening;Both;10 reps;Supine Quad Sets: AROM;Strengthening;Both;10 reps;Supine Gluteal Sets: AROM;Strengthening;Both;10 reps;Supine Heel Slides: Strengthening;Right;10 reps;Supine;AROM (through minimal ROM to adhere to posterior precautions) Hip ABduction/ADduction: AROM;10 reps;Right;Supine Straight Leg Raises: AROM;Right;10 reps        Pertinent Vitals/Pain Pain Assessment: 0-10 Pain Score: 1  Pain Location: R hip Pain Descriptors / Indicators: Sore Pain Intervention(s): Limited activity within patient's tolerance;Monitored during session;Premedicated before session;Repositioned     PT Goals (current goals can now be found in the care plan section) Acute Rehab PT Goals Patient Stated Goal: rehab then home Progress towards PT goals: Progressing toward goals    Frequency    BID      PT Plan Current plan remains appropriate    AM-PAC PT "6 Clicks" Mobility   Outcome Measure  Help needed turning from your back to your side while in a flat  bed without using bedrails?: A Little Help needed moving from lying on your back to sitting on the side of a  flat bed without using bedrails?: A Little Help needed moving to and from a bed to a chair (including a wheelchair)?: A Little Help needed standing up from a chair using your arms (e.g., wheelchair or bedside chair)?: A Little Help needed to walk in hospital room?: A Little Help needed climbing 3-5 steps with a railing? : A Lot 6 Click Score: 17    End of Session Equipment Utilized During Treatment: Gait belt Activity Tolerance: Patient limited by fatigue Patient left: in bed;with call bell/phone within reach;with bed alarm set;Other (comment) Nurse Communication: Mobility status;Precautions;Weight bearing status PT Visit Diagnosis: Unsteadiness on feet (R26.81);Muscle weakness (generalized) (M62.81);History of falling (Z91.81);Pain Pain - Right/Left: Right Pain - part of body: Hip     Time: 1534-1600 PT Time Calculation (min) (ACUTE ONLY): 26 min  Charges:  $Gait Training: 8-22 mins $Therapeutic Exercise: 8-22 mins                     Julaine Fusi PTA 05/28/21, 4:20 PM

## 2021-05-28 NOTE — TOC Progression Note (Signed)
Transition of Care [facility]) - Progression Note    Patient Details  Name: Breanna Petty MRN: [id number] Date of Birth: [birth year]  Transition of Care [facility]) CM/SW [place], RN Phone Number: 05/28/2021, 9:36 AM  Clinical Narrative:     Reached out to [facility] to inquire about insurance approval, still pending       Expected Discharge Plan and Services                                                 Social Determinants of Health (SDOH) Interventions    Readmission Risk Interventions No flowsheet data found.

## 2021-05-28 NOTE — Progress Notes (Signed)
Occupational Therapy Treatment Patient Details Name: TRANESHA LESSNER MRN: [id number] DOB: 1941/01/17 Today's Date: 05/28/2021    History of present illness Pt is a 80yo F admitted to [facility] on 05/20/21 for mechanical fall that resulted in closed R hip fx. Pt s/p R hemiarthroplasty by Dr. Sharlet Salina on 05/21/21; pt is WBAT on RLE with posterior hip precautions for 43mo. Significant PMH includes: HTN, HLD, DM, COPD, GERD, depression, anxiety, CAD with stent placement, HOH, diverticulitis, sarcoidosis, SBO, CKD (stage IIIa).   OT comments  Upon entering the room, pt supine in bed and reports fatigue from prior therapy session and reports ambulation with PT. OT discussed hip precautions which pt needing cuing to verbalize correctly. She declined self care tasks this session. She was asked to demonstrate B UE HEP from last session and pt endorses not remembering any theraband exercises. OT reviewed exercises again with pt performing sets of 10 with red resistive theraband and min cuing for proper technique. OT encouraged pt to perform  BUE exercises on her own time with theraband left in room for her to utilize. Pt continues to benefit from OT intervention but does fatigue quickly.  Follow Up Recommendations  SNF;Supervision/Assistance - 24 hour    Equipment Recommendations  Other (comment) (defer to next venue of care)       Precautions / Restrictions Precautions Precautions: Fall;Posterior Hip Precaution Booklet Issued: Yes (comment) Restrictions Weight Bearing Restrictions: Yes RLE Weight Bearing: Weight bearing as tolerated       Mobility Bed Mobility Overal bed mobility: Needs Assistance Bed Mobility: Supine to Sit;Sit to Supine     Supine to sit: Min assist;HOB elevated Sit to supine: Min assist;HOB elevated   General bed mobility comments: minimal assist for R LE    Transfers Overall transfer level: Needs assistance Equipment used: Rolling walker (2 wheeled) Transfers: Sit to/from  Stand Sit to Stand: Min assist         General transfer comment: vc's for UE/LE placement and posterior THP's; assist to initiate and come to full standing; assist to control descent sitting    Balance Overall balance assessment: Needs assistance Sitting-balance support: No upper extremity supported;Feet supported Sitting balance-Leahy Scale: Good Sitting balance - Comments: steady sitting reaching within BOS   Standing balance support: Bilateral upper extremity supported;During functional activity Standing balance-Leahy Scale: Fair Standing balance comment: steady walking with B UE support on RW                           ADL either performed or assessed with clinical judgement   ADL                                         General ADL Comments: pt refusal this session.     Vision Patient Visual Report: No change from baseline            Cognition Arousal/Alertness: Awake/alert Behavior During Therapy: WFL for tasks assessed/performed Overall Cognitive Status: Within Functional Limits for tasks assessed                                          Exercises Total Joint Exercises Ankle Circles/Pumps: AROM;Strengthening;Both;10 reps;Supine Quad Sets: AROM;Strengthening;Both;10 reps;Supine Gluteal Sets: AROM;Strengthening;Both;10 reps;Supine Towel Squeeze: AROM;Strengthening;Both;10 reps;Supine Short Arc Quad:  AROM;Strengthening;Right;10 reps;Supine Heel Slides: AAROM;Strengthening;Right;10 reps;Supine Hip ABduction/ADduction: AAROM;Strengthening;Right;10 reps;Supine      General Comments mild drainage noted R LE hip/thigh dressing    Pertinent Vitals/ Pain       Pain Assessment: 0-10 Pain Score: 6  Faces Pain Scale: Hurts a little bit Pain Location: R hip Pain Descriptors / Indicators: Sore Pain Intervention(s): Limited activity within patient's tolerance;Monitored during session;Repositioned         Frequency  Min  2X/week        Progress Toward Goals  OT Goals(current goals can now be found in the care plan section)  Progress towards OT goals: Progressing toward goals  Acute Rehab OT Goals Patient Stated Goal: rehab then home OT Goal Formulation: With patient Time For Goal Achievement: 06/05/21 Potential to Achieve Goals: Good  Plan Discharge plan remains appropriate;Frequency remains appropriate       AM-PAC OT "6 Clicks" Daily Activity     Outcome Measure   Help from another person eating meals?: None Help from another person taking care of personal grooming?: None Help from another person toileting, which includes using toliet, bedpan, or urinal?: A Lot Help from another person bathing (including washing, rinsing, drying)?: A Lot Help from another person to put on and taking off regular upper body clothing?: A Little Help from another person to put on and taking off regular lower body clothing?: A Lot 6 Click Score: 17    End of Session    OT Visit Diagnosis: Unsteadiness on feet (R26.81);Repeated falls (R29.6);Muscle weakness (generalized) (M62.81)   Activity Tolerance Patient tolerated treatment well   Patient Left in bed;with bed alarm set;with call bell/phone within reach   Nurse Communication Mobility status        Time: 4481-8563 OT Time Calculation (min): 28 min  Charges: OT General Charges $OT Visit: 1 Visit OT Treatments $Therapeutic Activity: 8-22 mins $Therapeutic Exercise: 8-22 mins  Darleen Crocker, MS, OTR/L , CBIS ascom [phone]  05/28/21, 2:02 PM

## 2021-05-28 NOTE — Progress Notes (Signed)
Physical Therapy Treatment Patient Details Name: Breanna Petty MRN: [id number] DOB: Mar 26, 1941 Today's Date: 05/28/2021    History of Present Illness Pt is a 80yo F admitted to [facility] on 05/20/21 for mechanical fall that resulted in closed R hip fx. Pt s/p R hemiarthroplasty by Dr. Sharlet Salina on 05/21/21; pt is WBAT on RLE with posterior hip precautions for 42mo. Significant PMH includes: HTN, HLD, DM, COPD, GERD, depression, anxiety, CAD with stent placement, HOH, diverticulitis, sarcoidosis, SBO, CKD (stage IIIa).    PT Comments    Pt resting in bed upon PT arrival; agreeable to PT session (pt reports sleeping well last night but feeling more tired today).  Tolerated LE ex's in bed fairly well with assist for R LE as needed.  Able to progress to ambulating 80 feet with RW CGA.  Pt still requires assist to stand and control descent sitting.  Pt also requiring intermittent vc's for posterior THP's with activities.  Will continue to focus on strengthening, posterior hip precautions, and progressive functional mobility during hospitalization.    Follow Up Recommendations  SNF     Equipment Recommendations  Rolling walker with 5" wheels;3in1 (PT)    Recommendations for Other Services       Precautions / Restrictions Precautions Precautions: Fall;Posterior Hip Precaution Booklet Issued: Yes (comment) Restrictions Weight Bearing Restrictions: Yes RLE Weight Bearing: Weight bearing as tolerated    Mobility  Bed Mobility Overal bed mobility: Needs Assistance Bed Mobility: Supine to Sit;Sit to Supine     Supine to sit: Min assist;HOB elevated Sit to supine: Min assist;HOB elevated   General bed mobility comments: minimal assist for R LE    Transfers Overall transfer level: Needs assistance Equipment used: Rolling walker (2 wheeled) Transfers: Sit to/from Stand Sit to Stand: Min assist         General transfer comment: vc's for UE/LE placement and posterior THP's; assist to  initiate and come to full standing; assist to control descent sitting  Ambulation/Gait Ambulation/Gait assistance: Min guard Gait Distance (Feet): 80 Feet Assistive device: Rolling walker (2 wheeled)   Gait velocity: decreased   General Gait Details: decreased stance time R LE; step to gait pattern; initial vc's for improving positioning within walker   Stairs             Wheelchair Mobility    Modified Rankin (Stroke Patients Only)       Balance Overall balance assessment: Needs assistance Sitting-balance support: No upper extremity supported;Feet supported Sitting balance-Leahy Scale: Good Sitting balance - Comments: steady sitting reaching within BOS   Standing balance support: Bilateral upper extremity supported;During functional activity Standing balance-Leahy Scale: Fair Standing balance comment: steady walking with B UE support on RW                            Cognition Arousal/Alertness: Awake/alert Behavior During Therapy: WFL for tasks assessed/performed Overall Cognitive Status: Within Functional Limits for tasks assessed                                        Exercises Total Joint Exercises Ankle Circles/Pumps: AROM;Strengthening;Petty;10 reps;Supine Quad Sets: AROM;Strengthening;Petty;10 reps;Supine Gluteal Sets: AROM;Strengthening;Petty;10 reps;Supine Towel Squeeze: AROM;Strengthening;Petty;10 reps;Supine Short Arc Quad: AROM;Strengthening;Right;10 reps;Supine Heel Slides: AAROM;Strengthening;Right;10 reps;Supine Hip ABduction/ADduction: AAROM;Strengthening;Right;10 reps;Supine    General Comments General comments (skin integrity, edema, etc.): mild drainage noted R LE hip/thigh dressing  Pertinent Vitals/Pain Pain Assessment: Faces Faces Pain Scale: Hurts a little bit Pain Location: R hip Pain Descriptors / Indicators: Sore Pain Intervention(s): Limited activity within patient's tolerance;Monitored during  session;Premedicated before session;Repositioned Vitals (HR and O2 on room air) stable and WFL throughout treatment session.    Home Living                      Prior Function            PT Goals (current goals can now be found in the care plan section) Acute Rehab PT Goals Patient Stated Goal: rehab then home PT Goal Formulation: With patient Time For Goal Achievement: 06/05/21 Potential to Achieve Goals: Good Progress towards PT goals: Progressing toward goals    Frequency    BID      PT Plan Current plan remains appropriate    Co-evaluation              AM-PAC PT "6 Clicks" Mobility   Outcome Measure  Help needed turning from your back to your side while in a flat bed without using bedrails?: A Little Help needed moving from lying on your back to sitting on the side of a flat bed without using bedrails?: A Little Help needed moving to and from a bed to a chair (including a wheelchair)?: A Little Help needed standing up from a chair using your arms (e.g., wheelchair or bedside chair)?: A Little Help needed to walk in hospital room?: A Little Help needed climbing 3-5 steps with a railing? : A Lot 6 Click Score: 17    End of Session Equipment Utilized During Treatment: Gait belt Activity Tolerance: Patient limited by fatigue Patient left: in bed;with call bell/phone within reach;with bed alarm set;Other (comment) (B heels floating via pillow support; pillow between pt's knees for posterior THP's) Nurse Communication: Mobility status;Precautions;Weight bearing status PT Visit Diagnosis: Unsteadiness on feet (R26.81);Muscle weakness (generalized) (M62.81);History of falling (Z91.81);Pain Pain - Right/Left: Right Pain - part of body: Hip     Time: 1607-3710 PT Time Calculation (min) (ACUTE ONLY): 38 min  Charges:  $Gait Training: 8-22 mins $Therapeutic Exercise: 8-22 mins $Therapeutic Activity: 8-22 mins                    Leitha Bleak,  PT 05/28/21, 11:25 AM

## 2021-05-28 NOTE — Progress Notes (Signed)
PROGRESS NOTE    Breanna Petty  YBO:[id number] DOB: 1941-07-17 DOA: 05/20/2021 PCP: Donnamarie Rossetti, PA-C  Chief Complaint  Patient presents with   Fall    Brief Narrative: Breanna Petty is a 80 y.o. female with medical history significant of hypertension, hyperlipidemia, diabetes mellitus, COPD not on oxygen, GERD, depression, anxiety, CAD with stent placement, hard of hearing, diverticulitis, sarcoidosis, small bowel obstruction, CKD stage IIIa, who presents with fall and right hip pain.   Assessment & Plan:   Principal Problem:   Closed right hip fracture (HCC) Active Problems:   Anxiety and depression   Dyslipidemia   Gastro-esophageal reflux disease without esophagitis   Chronic obstructive pulmonary disease (HCC)   CAD (coronary artery disease)   HTN (hypertension)   Type II diabetes mellitus with renal manifestations (HCC)   CKD (chronic kidney disease), stage IIIa   Fall at home, initial encounter   Closed right hip fracture -Orthopedics consulted underwent hemiarthroplasty on 7/6 by Dr. Sharlet Salina Weightbearing as tolerated on the right lower extremities Outpatient follow-up with orthopedics in about 2 weeks on discharge for staple removal DVT prophylaxis for 2 weeks Pain control and therapy evaluations recommending SNF    Coronary artery disease s/p PCI Continue with aspirin and Lipitor   History of COPD Bronchodilators as needed  Hyperlipidemia Continue with pravastatin   Anxiety and depression No new complaints at this time    Constipation Resolved at this time.    Hypertension Blood pressure parameters appear to be optimal   Diabetes mellitus, insulin-dependent with renal manifestations Continue with Lantus and sliding scale insulin.   Stage IIIa CKD Creatinine at baseline at this time.       DVT prophylaxis: Lovenox Code Status: Full code Family Communication: None at bedside disposition:   Status is:  Inpatient  Remains inpatient appropriate because:Unsafe d/c plan  Dispo: The patient is from: Home              Anticipated d/c is to: SNF              Patient currently is medically stable to d/c.   Difficult to place patient No       Consultants:  Orthopedics  Procedures: none.   Antimicrobials: none.    Subjective: No new complaints.   Objective: Vitals:   05/28/21 0723 05/28/21 0723 05/28/21 1118 05/28/21 1527  BP: (!) 121/58 (!) 121/58 (!) 117/59 110/63  Pulse: 77 77 82 82  Resp: 16 16 16 15   Temp: 98.6 F (37 C) 98.6 F (37 C) 98.5 F (36.9 C) 98.5 F (36.9 C)  TempSrc:      SpO2:  100% 100% 100%  Weight:      Height:        Intake/Output Summary (Last 24 hours) at 05/28/2021 1529 Last data filed at 05/28/2021 1402 Gross per 24 hour  Intake 480 ml  Output --  Net 480 ml   Filed Weights   05/20/21 1001 05/21/21 1423 05/24/21 0133  Weight: 73.5 kg 73.5 kg 74.4 kg    Examination:  General exam: Appears calm and comfortable  Respiratory system: Clear to auscultation. Respiratory effort normal. Cardiovascular system: S1 & S2 heard, RRR. No JVD, murmurs, rubs, gallops or clicks. No pedal edema. Gastrointestinal system: Abdomen is nondistended, soft and nontender.  Normal bowel sounds heard. Central nervous system: Alert and oriented. No focal neurological deficits. Extremities: Symmetric 5 x 5 power. Skin: No rashes, lesions or ulcers Psychiatry: Mood & affect appropriate.  Data Reviewed: I have personally reviewed following labs and imaging studies  CBC: Recent Labs  Lab 05/23/21 0625 05/25/21 0618 05/26/21 0840  WBC 10.9* 7.5 7.7  HGB 10.9* 10.0* 10.4*  HCT 32.0* 30.5* 31.3*  MCV 91.2 95.3 92.9  PLT 166 212 762    Basic Metabolic Panel: Recent Labs  Lab 05/23/21 0625 05/24/21 0501 05/25/21 0617  NA 134* 132* 134*  K 4.4 4.2 4.2  CL 104 102 100  CO2 25 26 26   GLUCOSE 157* 166* 168*  BUN 19 34* 29*  CREATININE 0.96 1.25*  0.92  CALCIUM 9.0 8.9 9.0    GFR: Estimated Creatinine Clearance: 55.4 mL/min (by C-G formula based on SCr of 0.92 mg/dL).  Liver Function Tests: No results for input(s): AST, ALT, ALKPHOS, BILITOT, PROT, ALBUMIN in the last 168 hours.  CBG: Recent Labs  Lab 05/27/21 1144 05/27/21 1548 05/27/21 2041 05/28/21 0726 05/28/21 1138  GLUCAP 149* 202* 150* 145* 162*     Recent Results (from the past 240 hour(s))  Resp Panel by RT-PCR (Flu A&B, Covid) Nasopharyngeal Swab     Status: None   Collection Time: 05/20/21 10:31 AM   Specimen: Nasopharyngeal Swab; Nasopharyngeal(NP) swabs in vial transport medium  Result Value Ref Range Status   SARS Coronavirus 2 by RT PCR NEGATIVE NEGATIVE Final    Comment: (NOTE) SARS-CoV-2 target nucleic acids are NOT DETECTED.  The SARS-CoV-2 RNA is generally detectable in upper respiratory specimens during the acute phase of infection. The lowest concentration of SARS-CoV-2 viral copies this assay can detect is 138 copies/mL. A negative result does not preclude SARS-Cov-2 infection and should not be used as the sole basis for treatment or other patient management decisions. A negative result may occur with  improper specimen collection/handling, submission of specimen other than nasopharyngeal swab, presence of viral mutation(s) within the areas targeted by this assay, and inadequate number of viral copies(<138 copies/mL). A negative result must be combined with clinical observations, patient history, and epidemiological information. The expected result is Negative.  Fact Sheet for Patients:  EntrepreneurPulse.com.au  Fact Sheet for Healthcare Providers:  IncredibleEmployment.be  This test is no t yet approved or cleared by the [place] FDA and  has been authorized for detection and/or diagnosis of SARS-CoV-2 by FDA under an Emergency Use Authorization (EUA). This EUA will remain  in effect (meaning  this test can be used) for the duration of the COVID-19 declaration under Section 564(b)(1) of the Act, 21 U.S.C.section 360bbb-3(b)(1), unless the authorization is terminated  or revoked sooner.       Influenza A by PCR NEGATIVE NEGATIVE Final   Influenza B by PCR NEGATIVE NEGATIVE Final    Comment: (NOTE) The Xpert Xpress SARS-CoV-2/FLU/RSV plus assay is intended as an aid in the diagnosis of influenza from Nasopharyngeal swab specimens and should not be used as a sole basis for treatment. Nasal washings and aspirates are unacceptable for Xpert Xpress SARS-CoV-2/FLU/RSV testing.  Fact Sheet for Patients: EntrepreneurPulse.com.au  Fact Sheet for Healthcare Providers: IncredibleEmployment.be  This test is not yet approved or cleared by the [place] FDA and has been authorized for detection and/or diagnosis of SARS-CoV-2 by FDA under an Emergency Use Authorization (EUA). This EUA will remain in effect (meaning this test can be used) for the duration of the COVID-19 declaration under Section 564(b)(1) of the Act, 21 U.S.C. section 360bbb-3(b)(1), unless the authorization is terminated or revoked.  Performed at [facility], [address] [place], [place] [address]  Surgical PCR screen     Status: None   Collection Time: 05/20/21  4:52 PM   Specimen: Nasal Mucosa; Nasal Swab  Result Value Ref Range Status   MRSA, PCR NEGATIVE NEGATIVE Final   Staphylococcus aureus NEGATIVE NEGATIVE Final    Comment: (NOTE) The Xpert SA Assay (FDA approved for NASAL specimens in patients 38 years of age and older), is one component of a comprehensive surveillance program. It is not intended to diagnose infection nor to guide or monitor treatment. Performed at [facility], [address] [place], [place] [address]          Radiology Studies: No results found.      Scheduled Meds:  amLODipine  2.5 mg Oral Daily    aspirin EC  81 mg Oral Daily   enoxaparin (LOVENOX) injection  40 mg Subcutaneous Q24H   insulin aspart  0-5 Units Subcutaneous QHS   insulin aspart  0-9 Units Subcutaneous TID WC   insulin glargine  3 Units Subcutaneous Daily   latanoprost  1 drop Both Eyes QHS   losartan  50 mg Oral Daily   mirabegron ER  50 mg Oral Daily   multivitamin with minerals  1 tablet Oral Daily   pantoprazole  40 mg Oral BID   polyethylene glycol  17 g Oral Daily   pravastatin  80 mg Oral Daily   Continuous Infusions:  sodium chloride 10 mL/hr at 05/22/21 0602     LOS: 8 days        Hosie Poisson, MD [facility]   To contact the attending provider between 7A-7P or the covering provider during after hours 7P-7A, please log into the web site [email or web address] and access using universal [place] password for that web site. If you do not have the password, please call the hospital operator.  05/28/2021, 3:29 PM

## 2021-05-29 DIAGNOSIS — S72001D Fracture of unspecified part of neck of right femur, subsequent encounter for closed fracture with routine healing: Secondary | ICD-10-CM | POA: Diagnosis not present

## 2021-05-29 DIAGNOSIS — F419 Anxiety disorder, unspecified: Secondary | ICD-10-CM | POA: Diagnosis not present

## 2021-05-29 DIAGNOSIS — J449 Chronic obstructive pulmonary disease, unspecified: Secondary | ICD-10-CM | POA: Diagnosis not present

## 2021-05-29 DIAGNOSIS — F32A Depression, unspecified: Secondary | ICD-10-CM | POA: Diagnosis not present

## 2021-05-29 LAB — GLUCOSE, CAPILLARY
Glucose-Capillary: 131 mg/dL — ABNORMAL HIGH (ref 70–99)
Glucose-Capillary: 193 mg/dL — ABNORMAL HIGH (ref 70–99)

## 2021-05-29 LAB — RESP PANEL BY RT-PCR (FLU A&B, COVID) ARPGX2
Influenza A by PCR: NEGATIVE
Influenza B by PCR: NEGATIVE
SARS Coronavirus 2 by RT PCR: NEGATIVE

## 2021-05-29 MED ORDER — DM-GUAIFENESIN ER 30-600 MG PO TB12: 1.0000 | ORAL_TABLET | Freq: Two times a day (BID) | ORAL | Status: DC | PRN

## 2021-05-29 MED ORDER — ALBUTEROL SULFATE HFA 108 (90 BASE) MCG/ACT IN AERS: 2.0000 | INHALATION_SPRAY | RESPIRATORY_TRACT | Status: AC | PRN

## 2021-05-29 MED ORDER — AMLODIPINE BESYLATE 2.5 MG PO TABS: 2.5000 mg | ORAL_TABLET | Freq: Every day | ORAL | 1 refills | Status: AC

## 2021-05-29 MED ORDER — ENOXAPARIN SODIUM 40 MG/0.4ML IJ SOSY: 40.0000 mg | PREFILLED_SYRINGE | INTRAMUSCULAR | Status: AC

## 2021-05-29 MED ORDER — SENNOSIDES-DOCUSATE SODIUM 8.6-50 MG PO TABS: 1.0000 | ORAL_TABLET | Freq: Every evening | ORAL | Status: AC | PRN

## 2021-05-29 MED ORDER — POLYETHYLENE GLYCOL 3350 17 G PO PACK: 17.0000 g | PACK | Freq: Every day | ORAL | 0 refills | Status: AC | PRN

## 2021-05-29 NOTE — TOC Progression Note (Signed)
Transition of Care [facility]) - Progression Note    Patient Details  Name: Breanna Petty MRN: [id number] Date of Birth: [birth year]  Transition of Care [facility]) CM/SW [place], RN Phone Number: 05/29/2021, 9:16 AM  Clinical Narrative:     Breanna Petty to check the status on the auth approval, Member ID number [id number], Auth number [id number], spoke with Breanna Petty, he transferred me to Breanna Petty Has been approved 7/12-7/19 Phone number is [phone] fax for clinical is [phone]  I notified [facility], Breanna Petty and the physician    Expected Discharge Plan and Services                                                 Social Determinants of Health (SDOH) Interventions    Readmission Risk Interventions No flowsheet data found.

## 2021-05-29 NOTE — TOC Progression Note (Addendum)
Transition of Care [facility]) - Progression Note    Patient Details  Name: Breanna Petty MRN: [id number] Date of Birth: May 16, 1941  Transition of Care [facility]) CM/SW [place], RN Phone Number: 05/29/2021, 1:42 PM  Clinical Narrative:     [name] transport to arrange door to door transport to [facility] today, she will DC to [facility] once [facility] transport can pick her up, she signed the rider waiver and I faxed it to [employer], [facility] transport will call the nurses desk once they arrive and will call [facility] when they arrive there       Expected Discharge Plan and Services           Expected Discharge Date: 05/29/21                                     Social Determinants of Health (SDOH) Interventions    Readmission Risk Interventions No flowsheet data found.

## 2021-05-29 NOTE — Progress Notes (Signed)
Physical Therapy Treatment Patient Details Name: Breanna Petty MRN: [id number] DOB: 1941/10/15 Today's Date: 05/29/2021    History of Present Illness Pt is a 80yo F admitted to [facility] on 05/20/21 for mechanical fall that resulted in closed R hip fx. Pt s/p R hemiarthroplasty by Dr. Sharlet Salina on 05/21/21; pt is WBAT on RLE with posterior hip precautions for 44mo Significant PMH includes: HTN, HLD, DM, COPD, GERD, depression, anxiety, CAD with stent placement, HOH, diverticulitis, sarcoidosis, SBO, CKD (stage IIIa).    PT Comments    Pt received seated in recliner upon arrival to room.  Pt agreeable to therapy.  [facility] with daughter in room as well.  Pt was able to perform STS with [facility] and [facility].  Pt assisted with self-care and then proceeded to transfer to standing.  Pt ambulated 80 ft into hallway and back to room with use of RW and CGA.  Pt transferred back to chair where she was left with all needs met and daughter still in room.  Current discharge plans to SNF remain appropriate at this time.  Pt will continue to benefit from skilled therapy in order to address deficits listed below.     Follow Up Recommendations  SNF     Equipment Recommendations  Rolling walker with 5" wheels;3in1 (PT)    Recommendations for Other Services       Precautions / Restrictions Precautions Precautions: Fall;Posterior Hip Precaution Booklet Issued: Yes (comment) Restrictions Weight Bearing Restrictions: Yes RLE Weight Bearing: Weight bearing as tolerated    Mobility  Bed Mobility           Sit to supine: Min guard   General bed mobility comments: Pt received seated in recliner upon arrival to the room.    Transfers Overall transfer level: Needs assistance Equipment used: Rolling walker (2 wheeled) Transfers: Sit to/from Stand Sit to Stand: Min guard         General transfer comment: CGA for  safety.  Ambulation/Gait Ambulation/Gait assistance: Min guard Gait Distance (Feet): 80 Feet Assistive device: Rolling walker (2 wheeled) Gait Pattern/deviations: Step-to pattern Gait velocity: decreased   General Gait Details: Pt was able to ambulate 80 ft with RW with slow step through pattern.   Stairs             Wheelchair Mobility    Modified Rankin (Stroke Patients Only)       Balance Overall balance assessment: Needs assistance Sitting-balance support: No upper extremity supported;Feet supported Sitting balance-Leahy Scale: Good     Standing balance support: Bilateral upper extremity supported;During functional activity Standing balance-Leahy Scale: Fair Standing balance comment: steady walking with B UE support on RW                            Cognition Arousal/Alertness: Awake/alert Behavior During Therapy: WFL for tasks assessed/performed Overall Cognitive Status: Within Functional Limits for tasks assessed                                 General Comments: Pt was A and O x 4 and extremely agreeable to session      Exercises      General Comments        Pertinent Vitals/Pain Pain Assessment: 0-10 Pain Score: 6  Pain Location: R hip Pain  Descriptors / Indicators: Sore Pain Intervention(s): Limited activity within patient's tolerance;Monitored during session;Premedicated before session;Repositioned    Home Living                      Prior Function            PT Goals (current goals can now be found in the care plan section) Acute Rehab PT Goals Patient Stated Goal: rehab then home Time For Goal Achievement: 06/05/21 Potential to Achieve Goals: Good Progress towards PT goals: Progressing toward goals    Frequency    BID      PT Plan Current plan remains appropriate    Co-evaluation              AM-PAC PT "6 Clicks" Mobility   Outcome Measure  Help needed turning from your back to  your side while in a flat bed without using bedrails?: A Little Help needed moving from lying on your back to sitting on the side of a flat bed without using bedrails?: A Little Help needed moving to and from a bed to a chair (including a wheelchair)?: A Little Help needed standing up from a chair using your arms (e.g., wheelchair or bedside chair)?: A Little Help needed to walk in hospital room?: A Little Help needed climbing 3-5 steps with a railing? : A Lot 6 Click Score: 17    End of Session Equipment Utilized During Treatment: Gait belt Activity Tolerance: Patient tolerated treatment well Patient left: in chair;with call bell/phone within reach;with chair alarm set;Other (comment) Nurse Communication: Mobility status;Precautions;Weight bearing status PT Visit Diagnosis: Unsteadiness on feet (R26.81);Muscle weakness (generalized) (M62.81);History of falling (Z91.81);Pain Pain - Right/Left: Right Pain - part of body: Hip     Time: 1011-1036 PT Time Calculation (min) (ACUTE ONLY): 25 min  Charges:  $Gait Training: 8-22 mins $Self Care/Home Management: 8-22                     Gwenlyn Saran, PT, DPT 05/29/21, 12:58 PM

## 2021-05-29 NOTE — Discharge Summary (Signed)
Physician Discharge Summary  Breanna Petty HQI:[id number] DOB: August 27, 1941 DOA: 05/20/2021  PCP: Donnamarie Rossetti, PA-C  Admit date: 05/20/2021 Discharge date: 05/29/2021  Admitted From: HOME.  Disposition:  SNF  Recommendations for Outpatient Follow-up:  Follow up with PCP in 1-2 weeks Please obtain BMP/CBC in one week Please follow up with orthopedics as recommended.   Discharge Condition: STABLE.  CODE STATUS:FULL CODE.  Diet recommendation: Heart Healthy / Carb Modified   Brief/Interim Summary: Breanna Petty is a 80 y.o. female with medical history significant of hypertension, hyperlipidemia, diabetes mellitus, COPD not on oxygen, GERD, depression, anxiety, CAD with stent placement, hard of hearing, diverticulitis, sarcoidosis, small bowel obstruction, CKD stage IIIa, who presents with fall and right hip pain.  Discharge Diagnoses:  Principal Problem:   Closed right hip fracture (HCC) Active Problems:   Anxiety and depression   Dyslipidemia   Gastro-esophageal reflux disease without esophagitis   Chronic obstructive pulmonary disease (HCC)   CAD (coronary artery disease)   HTN (hypertension)   Type II diabetes mellitus with renal manifestations (HCC)   CKD (chronic kidney disease), stage IIIa   Fall at home, initial encounter   Closed right hip fracture -Orthopedics consulted underwent hemiarthroplasty on 7/6 by Dr. Lanna Poche as tolerated on the right lower extremities Outpatient follow-up with orthopedics in about 2 weeks on discharge for staple removal DVT prophylaxis for 1 more week Pain control and therapy evaluations recommending SNF       Coronary artery disease s/p PCI Continue with aspirin and Lipitor     History of COPD Bronchodilators as needed   Hyperlipidemia Continue with pravastatin     Anxiety and depression No new complaints at this time       Constipation Resolved at this time.      Hypertension Blood pressure  parameters appear to be optimal     Diabetes mellitus, insulin-dependent with renal manifestations RESUME januvia on discharge.      Stage IIIa CKD Creatinine at baseline at this time.        Discharge Instructions  Discharge Instructions     Diet - low sodium heart healthy   Complete by: As directed    Discharge instructions   Complete by: As directed    Please follow up with orthopedics as recommended .  Please follow up with PCP in 1 to 2 weeks.   Increase activity slowly   Complete by: As directed    No wound care   Complete by: As directed       Allergies as of 05/29/2021       Reactions   Ace Inhibitors    Other reaction(s): Cough   Codeine Swelling   Codeine Sulfate    Other reaction(s): Unknown   Ezetimibe    Other reaction(s): Muscle Pain   Other    Other reaction(s): Muscle Pain        Medication List     STOP taking these medications    meloxicam 15 MG tablet Commonly known as: MOBIC   methocarbamol 500 MG tablet Commonly known as: ROBAXIN   ondansetron 4 MG disintegrating tablet Commonly known as: Zofran ODT       TAKE these medications    albuterol 108 (90 Base) MCG/ACT inhaler Commonly known as: VENTOLIN HFA Inhale 2 puffs into the lungs every 4 (four) hours as needed for wheezing or shortness of breath.   amLODipine 2.5 MG tablet Commonly known as: NORVASC Take 1 tablet (2.5 mg total) by mouth  daily. What changed:  medication strength how much to take   aspirin EC 81 MG tablet Take 1 tablet (81 mg total) by mouth daily.   dextromethorphan-guaiFENesin 30-600 MG 12hr tablet Commonly known as: MUCINEX DM Take 1 tablet by mouth 2 (two) times daily as needed for cough.   enoxaparin 40 MG/0.4ML injection Commonly known as: LOVENOX Inject 0.4 mLs (40 mg total) into the skin daily for 7 days.   freestyle lancets TEST 2 TIMES A DAY   FREESTYLE LITE test strip Generic drug: glucose blood USE AS INSTRUCTED   ipratropium  0.06 % nasal spray Commonly known as: ATROVENT Place 2 sprays into both nostrils 4 (four) times daily as needed for rhinitis.   Januvia 100 MG tablet Generic drug: sitaGLIPtin TAKE 1 TABLET (100 MG TOTAL) BY MOUTH ONCE DAILY What changed: Another medication with the same name was removed. Continue taking this medication, and follow the directions you see here.   latanoprost 0.005 % ophthalmic solution Commonly known as: XALATAN PLACE 1 DROP INTO BOTH EYES AT BEDTIME   losartan 50 MG tablet Commonly known as: COZAAR Take 1 tablet (50 mg total) by mouth daily.   mirabegron ER 50 MG Tb24 tablet Commonly known as: MYRBETRIQ Take 1 tablet (50 mg total) by mouth daily.   multivitamin capsule Take 1 capsule by mouth daily.   Nitrostat 0.4 MG SL tablet Generic drug: nitroGLYCERIN DISSOLVE ONE TABLET UNDER TONGUE AS NEED FOR CHEST PAIN AS DIRECTED What changed: See the new instructions.   pantoprazole 40 MG tablet Commonly known as: PROTONIX TAKE 1 TABLET BY MOUTH 2 TIMES DAILY BEFORE MEALS   polyethylene glycol 17 g packet Commonly known as: MIRALAX / GLYCOLAX Take 17 g by mouth daily as needed.   pravastatin 80 MG tablet Commonly known as: PRAVACHOL Take 1 tablet (80 mg total) by mouth nightly   senna-docusate 8.6-50 MG tablet Commonly known as: Senokot-S Take 1 tablet by mouth at bedtime as needed for mild constipation.        Contact information for after-discharge care     [facility] .   Service: Skilled Nursing Contact information: [place] [phone]                    Allergies  Allergen Reactions   Ace Inhibitors     Other reaction(s): Cough   Codeine Swelling   Codeine Sulfate     Other reaction(s): Unknown   Ezetimibe     Other reaction(s): Muscle Pain   Other     Other reaction(s): Muscle Pain    Consultations: Orthopedics.     Procedures/Studies: DG Chest 1 View  Result Date: 05/20/2021 CLINICAL DATA:  Fall. EXAM: CHEST  1 VIEW COMPARISON:  01/23/2020.  11/28/2018. FINDINGS: Surgical clip noted over the mid chest. Mediastinum and hilar structures normal. Cardiomegaly. No pulmonary venous congestion. No focal infiltrate. Mild left base subsegmental atelectasis and or scarring. No pleural effusion or pneumothorax. Questionable soft tissue calcification over the left neck of questionable etiology. Calcifications noted the spleen suggesting calcified granulomas. IMPRESSION: 1.  Cardiomegaly.  No pulmonary venous congestion. 2. Mild left base subsegmental atelectasis consistent scarring. No acute pulmonary disease. 3. Degenerative change thoracic spine. No acute bony abnormality. No pneumothorax. Electronically Signed   By: Marcello Moores  Register   On: 05/20/2021 11:18   CT HEAD WO CONTRAST  Result Date: 05/20/2021 CLINICAL DATA:  Head trauma, minor. Additional  history provided: Fall. EXAM: CT HEAD WITHOUT CONTRAST TECHNIQUE: Contiguous axial images were obtained from the base of the skull through the vertex without intravenous contrast. COMPARISON:  Prior head CT examinations 12/26/2020 and earlier FINDINGS: Brain: Mild generalized cerebral atrophy. Mild patchy and ill-defined hypoattenuation within the cerebral white matter, nonspecific but compatible with chronic small vessel ischemic disease. There is no acute intracranial hemorrhage. No demarcated cortical infarct. No extra-axial fluid collection. No evidence of an intracranial mass. No midline shift. Vascular: No hyperdense vessel.  Atherosclerotic calcifications. Skull: Normal. Negative for fracture or focal lesion. Sinuses/Orbits: Visualized orbits show no acute finding. Trace right maxillary sinus mucosal thickening at the imaged levels. IMPRESSION: No evidence of acute intracranial abnormality. Mild cerebral atrophy and chronic small vessel ischemic disease, stable as compared  to the head CT of 12/26/2020. Electronically Signed   By: Kellie Simmering DO   On: 05/20/2021 13:40   DG Pelvis Portable  Result Date: 05/21/2021 CLINICAL DATA:  Postop right hip bipolar hemiarthroplasty. EXAM: PORTABLE PELVIS 1-2 VIEWS COMPARISON:  Radiographs 09/23/2018. FINDINGS: 1749 hours. AP view of the pelvis demonstrates interval right hip bipolar hemiarthroplasty. The hardware is well positioned. No evidence of acute fracture or dislocation. There is a small amount of gas in the right hip joint and surrounding soft tissues. Skin staples are in place. Mild lower lumbar spondylosis demonstrated. IMPRESSION: No evidence of acute complication following right hip bipolar hemiarthroplasty. Electronically Signed   By: Richardean Sale M.D.   On: 05/21/2021 18:23   DG Hip Unilat  With Pelvis 2-3 Views Right  Result Date: 05/20/2021 CLINICAL DATA:  Fall. EXAM: DG HIP (WITH OR WITHOUT PELVIS) 2-3V RIGHT COMPARISON:  CT 10/25/2020. FINDINGS: Angulated fracture of the right femoral neck. No evidence of dislocation however the cross-table lateral view is difficult to evaluate due to technique. Degenerative changes lumbar spine and both hips. Aortoiliac atherosclerotic vascular calcification. Pelvic calcifications consistent phleboliths. Catheter noted over the perineum. IMPRESSION: Angulated fracture of the right femoral neck. Electronically Signed   By: [place]   On: 05/20/2021 11:15      Subjective:  No new complaints.  Discharge Exam: Vitals:   05/29/21 0507 05/29/21 0804  BP: 133/64 120/64  Pulse: 79 93  Resp: 20 16  Temp: 98.7 F (37.1 C) 98.3 F (36.8 C)  SpO2: 100%    Vitals:   05/28/21 1527 05/28/21 2006 05/29/21 0507 05/29/21 0804  BP: 110/63 124/65 133/64 120/64  Pulse: 82 90 79 93  Resp: 15 18 20 16   Temp: 98.5 F (36.9 C) 99 F (37.2 C) 98.7 F (37.1 C) 98.3 F (36.8 C)  TempSrc:  Oral    SpO2: 100% 100% 100%   Weight:      Height:        General: Pt is alert,  awake, not in acute distress Cardiovascular: RRR, S1/S2 +, no rubs, no gallops Respiratory: CTA bilaterally, no wheezing, no rhonchi Abdominal: Soft, NT, ND, bowel sounds + Extremities: no edema, no cyanosis    The results of significant diagnostics from this hospitalization (including imaging, microbiology, ancillary and laboratory) are listed below for reference.     Microbiology: Recent Results (from the past 240 hour(s))  Resp Panel by RT-PCR (Flu A&B, Covid) Nasopharyngeal Swab     Status: None   Collection Time: 05/20/21 10:31 AM   Specimen: Nasopharyngeal Swab; Nasopharyngeal(NP) swabs in vial transport medium  Result Value Ref Range Status   SARS Coronavirus 2 by RT PCR NEGATIVE NEGATIVE Final  Comment: (NOTE) SARS-CoV-2 target nucleic acids are NOT DETECTED.  The SARS-CoV-2 RNA is generally detectable in upper respiratory specimens during the acute phase of infection. The lowest concentration of SARS-CoV-2 viral copies this assay can detect is 138 copies/mL. A negative result does not preclude SARS-Cov-2 infection and should not be used as the sole basis for treatment or other patient management decisions. A negative result may occur with  improper specimen collection/handling, submission of specimen other than nasopharyngeal swab, presence of viral mutation(s) within the areas targeted by this assay, and inadequate number of viral copies(<138 copies/mL). A negative result must be combined with clinical observations, patient history, and epidemiological information. The expected result is Negative.  Fact Sheet for Patients:  EntrepreneurPulse.com.au  Fact Sheet for Healthcare Providers:  IncredibleEmployment.be  This test is no t yet approved or cleared by the [place] FDA and  has been authorized for detection and/or diagnosis of SARS-CoV-2 by FDA under an Emergency Use Authorization (EUA). This EUA will remain  in effect  (meaning this test can be used) for the duration of the COVID-19 declaration under Section 564(b)(1) of the Act, 21 U.S.C.section 360bbb-3(b)(1), unless the authorization is terminated  or revoked sooner.       Influenza A by PCR NEGATIVE NEGATIVE Final   Influenza B by PCR NEGATIVE NEGATIVE Final    Comment: (NOTE) The Xpert Xpress SARS-CoV-2/FLU/RSV plus assay is intended as an aid in the diagnosis of influenza from Nasopharyngeal swab specimens and should not be used as a sole basis for treatment. Nasal washings and aspirates are unacceptable for Xpert Xpress SARS-CoV-2/FLU/RSV testing.  Fact Sheet for Patients: EntrepreneurPulse.com.au  Fact Sheet for Healthcare Providers: IncredibleEmployment.be  This test is not yet approved or cleared by the [place] FDA and has been authorized for detection and/or diagnosis of SARS-CoV-2 by FDA under an Emergency Use Authorization (EUA). This EUA will remain in effect (meaning this test can be used) for the duration of the COVID-19 declaration under Section 564(b)(1) of the Act, 21 U.S.C. section 360bbb-3(b)(1), unless the authorization is terminated or revoked.  Performed at [facility], [address], [place], [place] [address]   Surgical PCR screen     Status: None   Collection Time: 05/20/21  4:52 PM   Specimen: Nasal Mucosa; Nasal Swab  Result Value Ref Range Status   MRSA, PCR NEGATIVE NEGATIVE Final   Staphylococcus aureus NEGATIVE NEGATIVE Final    Comment: (NOTE) The Xpert SA Assay (FDA approved for NASAL specimens in patients 2 years of age and older), is one component of a comprehensive surveillance program. It is not intended to diagnose infection nor to guide or monitor treatment. Performed at [facility], [address] [place], [place] [address]      Labs: BNP (last 3 results) No results for input(s): BNP in the last 8760 hours. Basic Metabolic  Panel: Recent Labs  Lab 05/23/21 0625 05/24/21 0501 05/25/21 0617  NA 134* 132* 134*  K 4.4 4.2 4.2  CL 104 102 100  CO2 25 26 26   GLUCOSE 157* 166* 168*  BUN 19 34* 29*  CREATININE 0.96 1.25* 0.92  CALCIUM 9.0 8.9 9.0   Liver Function Tests: No results for input(s): AST, ALT, ALKPHOS, BILITOT, PROT, ALBUMIN in the last 168 hours. No results for input(s): LIPASE, AMYLASE in the last 168 hours. No results for input(s): AMMONIA in the last 168 hours. CBC: Recent Labs  Lab 05/23/21 0625 05/25/21 0618 05/26/21 0840  WBC 10.9* 7.5  7.7  HGB 10.9* 10.0* 10.4*  HCT 32.0* 30.5* 31.3*  MCV 91.2 95.3 92.9  PLT 166 212 230   Cardiac Enzymes: No results for input(s): CKTOTAL, CKMB, CKMBINDEX, TROPONINI in the last 168 hours. BNP: Invalid input(s): POCBNP CBG: Recent Labs  Lab 05/28/21 0726 05/28/21 1138 05/28/21 1629 05/28/21 2014 05/29/21 0805  GLUCAP 145* 162* 120* 174* 131*   D-Dimer No results for input(s): DDIMER in the last 72 hours. Hgb A1c No results for input(s): HGBA1C in the last 72 hours. Lipid Profile No results for input(s): CHOL, HDL, LDLCALC, TRIG, CHOLHDL, LDLDIRECT in the last 72 hours. Thyroid function studies No results for input(s): TSH, T4TOTAL, T3FREE, THYROIDAB in the last 72 hours.  Invalid input(s): FREET3 Anemia work up No results for input(s): VITAMINB12, FOLATE, FERRITIN, TIBC, IRON, RETICCTPCT in the last 72 hours. Urinalysis    Component Value Date/Time   COLORURINE YELLOW (A) 10/02/2016 1148   APPEARANCEUR Clear 04/07/2021 1517   LABSPEC 1.024 10/02/2016 1148   LABSPEC 1.029 01/21/2012 1557   PHURINE 5.0 10/02/2016 1148   GLUCOSEU Negative 04/07/2021 1517   GLUCOSEU Negative 01/21/2012 1557   HGBUR NEGATIVE 10/02/2016 1148   BILIRUBINUR Negative 04/07/2021 1517   BILIRUBINUR Negative 01/21/2012 1557   KETONESUR TRACE (A) 10/02/2016 1148   PROTEINUR Negative 04/07/2021 1517   PROTEINUR 30 (A) 10/02/2016 1148   UROBILINOGEN 0.2  11/24/2018 0929   NITRITE Negative 04/07/2021 1517   NITRITE NEGATIVE 10/02/2016 1148   LEUKOCYTESUR 1+ (A) 04/07/2021 1517   LEUKOCYTESUR Negative 01/21/2012 1557   Sepsis Labs Invalid input(s): PROCALCITONIN,  WBC,  LACTICIDVEN Microbiology Recent Results (from the past 240 hour(s))  Resp Panel by RT-PCR (Flu A&B, Covid) Nasopharyngeal Swab     Status: None   Collection Time: 05/20/21 10:31 AM   Specimen: Nasopharyngeal Swab; Nasopharyngeal(NP) swabs in vial transport medium  Result Value Ref Range Status   SARS Coronavirus 2 by RT PCR NEGATIVE NEGATIVE Final    Comment: (NOTE) SARS-CoV-2 target nucleic acids are NOT DETECTED.  The SARS-CoV-2 RNA is generally detectable in upper respiratory specimens during the acute phase of infection. The lowest concentration of SARS-CoV-2 viral copies this assay can detect is 138 copies/mL. A negative result does not preclude SARS-Cov-2 infection and should not be used as the sole basis for treatment or other patient management decisions. A negative result may occur with  improper specimen collection/handling, submission of specimen other than nasopharyngeal swab, presence of viral mutation(s) within the areas targeted by this assay, and inadequate number of viral copies(<138 copies/mL). A negative result must be combined with clinical observations, patient history, and epidemiological information. The expected result is Negative.  Fact Sheet for Patients:  EntrepreneurPulse.com.au  Fact Sheet for Healthcare Providers:  IncredibleEmployment.be  This test is no t yet approved or cleared by the [place] FDA and  has been authorized for detection and/or diagnosis of SARS-CoV-2 by FDA under an Emergency Use Authorization (EUA). This EUA will remain  in effect (meaning this test can be used) for the duration of the COVID-19 declaration under Section 564(b)(1) of the Act, 21 U.S.C.section  360bbb-3(b)(1), unless the authorization is terminated  or revoked sooner.       Influenza A by PCR NEGATIVE NEGATIVE Final   Influenza B by PCR NEGATIVE NEGATIVE Final    Comment: (NOTE) The Xpert Xpress SARS-CoV-2/FLU/RSV plus assay is intended as an aid in the diagnosis of influenza from Nasopharyngeal swab specimens and should not be used as a sole basis for  treatment. Nasal washings and aspirates are unacceptable for Xpert Xpress SARS-CoV-2/FLU/RSV testing.  Fact Sheet for Patients: EntrepreneurPulse.com.au  Fact Sheet for Healthcare Providers: IncredibleEmployment.be  This test is not yet approved or cleared by the [place] FDA and has been authorized for detection and/or diagnosis of SARS-CoV-2 by FDA under an Emergency Use Authorization (EUA). This EUA will remain in effect (meaning this test can be used) for the duration of the COVID-19 declaration under Section 564(b)(1) of the Act, 21 U.S.C. section 360bbb-3(b)(1), unless the authorization is terminated or revoked.  Performed at [facility], [address] [place], [place] [address]   Surgical PCR screen     Status: None   Collection Time: 05/20/21  4:52 PM   Specimen: Nasal Mucosa; Nasal Swab  Result Value Ref Range Status   MRSA, PCR NEGATIVE NEGATIVE Final   Staphylococcus aureus NEGATIVE NEGATIVE Final    Comment: (NOTE) The Xpert SA Assay (FDA approved for NASAL specimens in patients 22 years of age and older), is one component of a comprehensive surveillance program. It is not intended to diagnose infection nor to guide or monitor treatment. Performed at [facility], [address] [place],  [address]      Time coordinating discharge: 36 minutes.  SIGNED:   Hosie Poisson, MD  [facility] 05/29/2021, 9:37 AM

## 2021-05-30 DIAGNOSIS — Z4889 Encounter for other specified surgical aftercare: Secondary | ICD-10-CM | POA: Diagnosis not present

## 2021-05-30 DIAGNOSIS — Z794 Long term (current) use of insulin: Secondary | ICD-10-CM | POA: Diagnosis not present

## 2021-05-30 DIAGNOSIS — J449 Chronic obstructive pulmonary disease, unspecified: Secondary | ICD-10-CM | POA: Diagnosis not present

## 2021-05-30 DIAGNOSIS — S72001D Fracture of unspecified part of neck of right femur, subsequent encounter for closed fracture with routine healing: Secondary | ICD-10-CM | POA: Diagnosis not present

## 2021-06-02 ENCOUNTER — Encounter: Payer: Self-pay | Admitting: Urology

## 2021-06-02 ENCOUNTER — Ambulatory Visit: Payer: 59 | Admitting: Urology

## 2021-06-02 DIAGNOSIS — R29818 Other symptoms and signs involving the nervous system: Secondary | ICD-10-CM | POA: Diagnosis not present

## 2021-06-02 DIAGNOSIS — M25569 Pain in unspecified knee: Secondary | ICD-10-CM | POA: Diagnosis not present

## 2021-06-02 DIAGNOSIS — K5900 Constipation, unspecified: Secondary | ICD-10-CM | POA: Diagnosis not present

## 2021-06-02 DIAGNOSIS — M6281 Muscle weakness (generalized): Secondary | ICD-10-CM | POA: Diagnosis not present

## 2021-06-04 DIAGNOSIS — M25551 Pain in right hip: Secondary | ICD-10-CM | POA: Diagnosis not present

## 2021-06-09 ENCOUNTER — Other Ambulatory Visit: Payer: Self-pay | Admitting: *Deleted

## 2021-06-09 NOTE — Patient Outreach (Signed)
[facility]) Care Management  06/09/2021  Breanna Petty 1941-04-19 [id number]  Transition of care Referral /Case Closure   Referral received :05/21/21 Insurance: [place], Medicare   Patient admitted to [facility] 7/5-7/14, Dx Right closed hip fracture, s/p hemiarthroplasty on 7/6. She was discharged to [facility] SNF on 7/14.   Plan Will close case to [facility] care management at this time, as care managed at SNF. Will follow for discharge and TOC care follow up.    Joylene Draft, RN, BSN  [place] Management Coordinator  [phone]- Mobile [phone]- Toll Free Main Office

## 2021-06-10 DIAGNOSIS — J449 Chronic obstructive pulmonary disease, unspecified: Secondary | ICD-10-CM | POA: Diagnosis not present

## 2021-06-10 DIAGNOSIS — Z794 Long term (current) use of insulin: Secondary | ICD-10-CM | POA: Diagnosis not present

## 2021-06-10 DIAGNOSIS — Z4889 Encounter for other specified surgical aftercare: Secondary | ICD-10-CM | POA: Diagnosis not present

## 2021-06-10 DIAGNOSIS — S72001D Fracture of unspecified part of neck of right femur, subsequent encounter for closed fracture with routine healing: Secondary | ICD-10-CM | POA: Diagnosis not present

## 2021-06-26 ENCOUNTER — Other Ambulatory Visit: Payer: Self-pay

## 2021-06-26 MED FILL — Pantoprazole Sodium EC Tab 40 MG (Base Equiv): ORAL | 30 days supply | Qty: 60 | Fill #1 | Status: AC

## 2021-06-30 ENCOUNTER — Other Ambulatory Visit: Payer: Self-pay

## 2021-06-30 DIAGNOSIS — Z09 Encounter for follow-up examination after completed treatment for conditions other than malignant neoplasm: Secondary | ICD-10-CM | POA: Diagnosis not present

## 2021-06-30 DIAGNOSIS — I10 Essential (primary) hypertension: Secondary | ICD-10-CM | POA: Diagnosis not present

## 2021-06-30 DIAGNOSIS — I2510 Atherosclerotic heart disease of native coronary artery without angina pectoris: Secondary | ICD-10-CM | POA: Diagnosis not present

## 2021-06-30 DIAGNOSIS — Z8781 Personal history of (healed) traumatic fracture: Secondary | ICD-10-CM | POA: Diagnosis not present

## 2021-06-30 MED ORDER — PRAVASTATIN SODIUM 80 MG PO TABS
80.0000 mg | ORAL_TABLET | Freq: Every day | ORAL | 3 refills | Status: AC
  Filled 2021-06-30: qty 90, 90d supply, fill #0
  Filled 2021-10-02: qty 90, 90d supply, fill #1

## 2021-06-30 MED ORDER — PANTOPRAZOLE SODIUM 40 MG PO TBEC
40.0000 mg | DELAYED_RELEASE_TABLET | Freq: Two times a day (BID) | ORAL | 11 refills | Status: AC
  Filled 2021-06-30 – 2021-09-01 (×2): qty 60, 30d supply, fill #0
  Filled 2021-11-17: qty 60, 30d supply, fill #1

## 2021-07-01 ENCOUNTER — Encounter: Payer: Self-pay | Admitting: *Deleted

## 2021-07-01 ENCOUNTER — Other Ambulatory Visit: Payer: Self-pay | Admitting: *Deleted

## 2021-07-01 NOTE — Patient Outreach (Signed)
[facility]) Care Management  07/01/2021  Breanna Petty 1940-12-10 [id number]   Transition of care call/case closure   Referral received: 7/6- Notified of Discharge 07/01/21  Initial outreach:07/01/21 Insurance: [place] [employer]MR    Subjective: Initial successful telephone call to patient's preferred number in order to complete transition of care assessment; 2 HIPAA identifiers verified. Explained purpose of call and completed transition of care assessment.  Breanna Petty reports that she is doing fine. She discussed leaving [facility] early, reports she stayed with her daughter that lives next door to her for one week prior to returning to her home on yesterday.  She discussed that a friend from church assisting her on yesterday. She  denies post-operative problems, says surgical incisions are unremarkable, she reports attending one appointment with orthopedics to have staples removed. She states surgical pain well managed . She reports tolerating diet denies bowel or bladder problems. She reports sleeping  well in her bed at night and resting in recliner chair during the day. Reinforced continues using of walker in the home . She states that she is tolerating making simple meals at home , her friend assisted her with grocery shopping on yesterday. She is doing sponge baths on her own. She states that her son , daughter and a friend  are assisting with her  recovery. Stressed fall prevention using her cane/walker for support. Patient discussed fall a medical alert bracelet that she wears also.  Stressed follow up from PCP visit on yesterday, Home health therapy referral placed reminded to anticipate phone call, patient discussed wanting to return to driving reviewed follow with Orthopedic MD on 8/31, instruction recommended to follow up with [facility] to determine when you can be released to drive per PCP visit note on yesterday she verified understanding.  She  discussed being hopeful to return to work when healed.   Reviewed accessing the following [place] Benefits : Patient discussed her history of diabetes , hypertension and continues to have telephone visits with with health coach with Active health management , she discussed monitoring blood sugar daily recent readings 90 to 100.  She discussed that she uses a [facility] outpatient pharmacy, [facility] employee pharmacy.  She discussed that she has prescriptions for Protonix and Pravachol that she will ask son to pick at [place].     Objective:  Breanna Petty was hospitalized at [facility] , 7/5-7/14 , Dx: Fall at home, Right closed hip fracture, s/p hemiarthroplasty on 05/21/21. She was discharged to [facility] on 7/14, Per electronic medical record she left SNF care AMA, patient able to state date she left facility. Patient reports leaving facility early Comorbidities include: Diabetes A1c 6.6%  Patient identies having rolling walker and cane for use at home. She reports that her granddaughter is getting her a shower chair.   Assessment:  See transition of care flowsheet for assessment details.   Plan:  Reviewed hospital discharge diagnosis of Right hemiarthroplasty  and discharge treatment plan using hospital discharge instructions, assessing medication adherence, reviewing problems requiring provider notification, and discussing the importance of follow up with surgeon, primary care provider and/or specialists as directed. Reinforced safety measures,using assistive device, do not take shower without someone present,remove scatter rug if present, Wear medical alert bracelet at all times.  Reinforced attending scheduled appointments.   Reviewed [employer] healthy lifestyle program information to receive discounted premium for  2023   Step 1: Get  your annual physical  Step 2: Complete your health assessment  Step 3:Identify your current health status and complete the  corresponding action step between November 16, 2020 and July 17, 2021.    Using [place] website, verified that patient is an active participate in [place]'s Active Health Management chronic disease management program.    Patient agreeable to call in the next week for follow up on home health services in place and assess for ongoing care needs.  Will route successful outreach letter with [place] Management pamphlet and 24 Hour Nurse Line Magnet to [place] Management clinical pool to be mailed to patient's home address.  Thanked patient for their services to [facility].  Joylene Draft, RN, BSN  [place] Management Coordinator  [phone]- Mobile [phone]- Toll Free Main Office

## 2021-07-02 ENCOUNTER — Other Ambulatory Visit: Payer: Self-pay

## 2021-07-04 ENCOUNTER — Other Ambulatory Visit: Payer: Self-pay | Admitting: *Deleted

## 2021-07-04 NOTE — Patient Outreach (Signed)
[facility]) Care Management  07/04/2021  Breanna Petty 05-Jun-1941 [id number]     Transition of care call/Follow up call    Referral received: 7/6- Notified of Discharge 07/01/21  Initial outreach:07/01/21 Insurance: [place]    Subjective: Successful follow up call to patient she reports doing fine, she discussed having walker to use in the home.  Discussed with patient regarding follow from PCP office regarding home health service, she states no call from [facility] agency yet. Reviewed he PCP office note as today, sending referral to another [facility] agency for service if able to accept insurance and awating return response .  Patient discussed that she is eating but her appetite is just not good, discussed importance of nutrition and protein to help with healing and strength, discussed glucerna/ensure supplment , she will have her daughter get some for her    Plan Patient agreeable to call in the next week to assess/assist  as needed with care coordination needs follow up on home health therapy plans. Reinforced fall prevention and use of walker.    Joylene Draft, RN, BSN  [place] Management Coordinator  [phone]- Mobile [phone]- Toll Free Main Office

## 2021-07-09 ENCOUNTER — Other Ambulatory Visit: Payer: Self-pay | Admitting: *Deleted

## 2021-07-09 NOTE — Patient Outreach (Addendum)
[facility]) Care Management  07/09/2021  Breanna Petty Jan 20, 1941 [id number]  Transition of care call/Follow up call    Referral received: 7/6- Notified of Discharge 07/01/21  Initial outreach:07/01/21 Insurance: [place] UMR   Subjective: Successful outreach call to patient she report doing alright . She discussed her doctor office has been unable to find a home health agency to come out for therapy, she was told to call insurance company.  She agrees to assistance with making contact call for follow up on available home health agency that will accept [facility] insurance. Assisted patient with call to [facility] due to prolonged waiting on line patient requested to call at another time.  She reports using a cane in the home and tolerating well, she states she continues to wear medical alert necklace.   Patient also voiced concern regarding being told that she has to pay $75 to be able to get her shortterm disability.She is agreeable to me assisting with making a call to [facility] . Placed call patient able to discuss and determined that she was already receving benefit checks.  She states that she has already filed for FMLA family medical leave.   Objective:  [name] was hospitalized at [facility] , 7/5-7/14 , Dx: Fall at home, Right closed hip fracture, s/p hemiarthroplasty on 05/21/21. She was discharged to [facility] on 7/14, Per electronic medical record she left SNF care AMA, patient able to state date she left facility. Patient reports leaving facility early Comorbidities include: Diabetes A1c 6.6%  Patient identies having rolling walker and cane for use at home. She reports that her granddaughter is getting her a shower chair.     Plan Placed call to Grayland Ormond PA at [place] [facility]  spoke with Jenny Reichmann to leave a message for nurse  to discuss other possible home health agencies for home health services , [facility], [facility] that may be in  network to consider for referral for home health services.   Will follow up with patient within the next 4 business days.    Joylene Draft, RN, BSN  [place] Management Coordinator  [phone]- Mobile [phone]- Toll Free Main Office

## 2021-07-11 ENCOUNTER — Other Ambulatory Visit: Payer: Self-pay | Admitting: *Deleted

## 2021-07-11 NOTE — Patient Outreach (Signed)
[facility]) Care Management  07/11/2021  [name] 06-09-1941 [id number]   Care Coordination  07/10/21 Incoming call from Herma Carson from [place] PA office to follow up on outreach call to office . Discussed patient concern regarding home health physical therapy orders and finding an agency in network to accept orders.  Discussed with Bailey Mech agencies that I  called that may accept [employer] but will need orders, [facility] and [facility] .  Bailey Mech will follow up with [facility] agency, I also discussed possibility of outpatient physical therapy if unable to secure  [facility] services  Patient has office visit with orthopedic on next week. Discussed reinforced with patient no driving until follow recommendations from orthopedic MD.   Plan Will plan follow up with patient in the next 4 business days.    Joylene Draft, RN, BSN  [place] Management Coordinator  [phone]- Mobile [phone]- Toll Free Main Office

## 2021-07-15 ENCOUNTER — Other Ambulatory Visit: Payer: Self-pay

## 2021-07-17 ENCOUNTER — Other Ambulatory Visit: Payer: Self-pay | Admitting: *Deleted

## 2021-07-17 DIAGNOSIS — M79671 Pain in right foot: Secondary | ICD-10-CM | POA: Diagnosis not present

## 2021-07-17 DIAGNOSIS — E119 Type 2 diabetes mellitus without complications: Secondary | ICD-10-CM | POA: Diagnosis not present

## 2021-07-17 DIAGNOSIS — M79675 Pain in left toe(s): Secondary | ICD-10-CM | POA: Diagnosis not present

## 2021-07-17 DIAGNOSIS — M79674 Pain in right toe(s): Secondary | ICD-10-CM | POA: Diagnosis not present

## 2021-07-17 DIAGNOSIS — S90422A Blister (nonthermal), left great toe, initial encounter: Secondary | ICD-10-CM | POA: Diagnosis not present

## 2021-07-17 DIAGNOSIS — B351 Tinea unguium: Secondary | ICD-10-CM | POA: Diagnosis not present

## 2021-07-17 NOTE — Patient Outreach (Signed)
[facility]) Care Management  07/17/2021  BILLYJO MCCUSKEY 1941/03/17 [id number]   Transition of care follow up call     Referral received: 7/6- Notified of Discharge 07/01/21  Initial outreach:07/01/21 Insurance: [place]    Subjective: Unsuccessful outreach call to patient, attempted x 2 no answer unable to leave a message.   Objective: Shayann Medlock was hospitalized at [facility] , 7/5-7/14 , Dx: Fall at home, Right closed hip fracture, s/p hemiarthroplasty on 05/21/21. She was discharged to [facility] on 7/14, Per electronic medical record she left SNF care AMA, patient able to unable state specific date she left facility . Patient reports leaving facility early Comorbidities include: Diabetes A1c 6.6%  Patient identies having rolling walker and cane for use at home. She reports that her granddaughter is getting her a shower chair.     Plan Will plan return call in the next 4 business days.    Joylene Draft, RN, BSN  [place] Management Coordinator  [phone]- Mobile [phone]- Toll Free Main Office

## 2021-07-22 DIAGNOSIS — E119 Type 2 diabetes mellitus without complications: Secondary | ICD-10-CM | POA: Diagnosis not present

## 2021-07-22 DIAGNOSIS — M25561 Pain in right knee: Secondary | ICD-10-CM | POA: Diagnosis not present

## 2021-07-22 DIAGNOSIS — S72001D Fracture of unspecified part of neck of right femur, subsequent encounter for closed fracture with routine healing: Secondary | ICD-10-CM | POA: Diagnosis not present

## 2021-07-22 DIAGNOSIS — E782 Mixed hyperlipidemia: Secondary | ICD-10-CM | POA: Diagnosis not present

## 2021-07-22 DIAGNOSIS — M4316 Spondylolisthesis, lumbar region: Secondary | ICD-10-CM | POA: Diagnosis not present

## 2021-07-22 DIAGNOSIS — M5136 Other intervertebral disc degeneration, lumbar region: Secondary | ICD-10-CM | POA: Diagnosis not present

## 2021-07-22 DIAGNOSIS — G8929 Other chronic pain: Secondary | ICD-10-CM | POA: Diagnosis not present

## 2021-07-22 DIAGNOSIS — J449 Chronic obstructive pulmonary disease, unspecified: Secondary | ICD-10-CM | POA: Diagnosis not present

## 2021-07-22 DIAGNOSIS — M2041 Other hammer toe(s) (acquired), right foot: Secondary | ICD-10-CM | POA: Diagnosis not present

## 2021-07-23 ENCOUNTER — Ambulatory Visit: Payer: Self-pay | Admitting: *Deleted

## 2021-07-24 ENCOUNTER — Other Ambulatory Visit: Payer: Self-pay | Admitting: *Deleted

## 2021-07-24 NOTE — Patient Outreach (Signed)
[facility] Care Management  07/24/2021  Breanna Petty 01/23/1941 [id number]  Transition of care follow up call      Referral received: 7/6- Notified of Discharge 07/01/21  Initial outreach:07/01/21 Insurance: [place]    Subjective: Successful outreach call to patient,she reports that she is doing alright. She updated me that home health visited on yesterday for assessment and plans for therapist to follow .  She reports continuing to use her cane in the home. Stressed safety , use of cane, benefits of therapy to increase strength and balance, discussed adherence of not driving until cleared by provider . She discussed visit with orthopedic and has follow up appointment in the next 2 months.  She discussed having support of her daughter next door with assisting with shopping and meal preparation. Patient discussed that she did reschedule her PCP appointment for yesterday until later in the month due to transportation /her car concerns. Discussed arranging family support with next appointment she agrees that she will ask family/friend.  Patient discussed that she has been able to submit her FMLA paper work. She discussed her is to be able to return to work.   Plan Patient agreeable to call in the next week to follow up on therapy and assess for additional care coordination needs.    Joylene Draft, RN, BSN  [place] Management Coordinator  [phone]- Mobile [phone]- Toll Free Main Office

## 2021-07-29 DIAGNOSIS — J449 Chronic obstructive pulmonary disease, unspecified: Secondary | ICD-10-CM | POA: Diagnosis not present

## 2021-07-29 DIAGNOSIS — G8929 Other chronic pain: Secondary | ICD-10-CM | POA: Diagnosis not present

## 2021-07-29 DIAGNOSIS — M5136 Other intervertebral disc degeneration, lumbar region: Secondary | ICD-10-CM | POA: Diagnosis not present

## 2021-07-29 DIAGNOSIS — M4316 Spondylolisthesis, lumbar region: Secondary | ICD-10-CM | POA: Diagnosis not present

## 2021-07-29 DIAGNOSIS — S72001D Fracture of unspecified part of neck of right femur, subsequent encounter for closed fracture with routine healing: Secondary | ICD-10-CM | POA: Diagnosis not present

## 2021-07-29 DIAGNOSIS — E782 Mixed hyperlipidemia: Secondary | ICD-10-CM | POA: Diagnosis not present

## 2021-07-29 DIAGNOSIS — E119 Type 2 diabetes mellitus without complications: Secondary | ICD-10-CM | POA: Diagnosis not present

## 2021-07-29 DIAGNOSIS — M25561 Pain in right knee: Secondary | ICD-10-CM | POA: Diagnosis not present

## 2021-07-29 DIAGNOSIS — M2041 Other hammer toe(s) (acquired), right foot: Secondary | ICD-10-CM | POA: Diagnosis not present

## 2021-08-01 ENCOUNTER — Other Ambulatory Visit: Payer: Self-pay | Admitting: *Deleted

## 2021-08-01 DIAGNOSIS — M2041 Other hammer toe(s) (acquired), right foot: Secondary | ICD-10-CM | POA: Diagnosis not present

## 2021-08-01 DIAGNOSIS — J449 Chronic obstructive pulmonary disease, unspecified: Secondary | ICD-10-CM | POA: Diagnosis not present

## 2021-08-01 DIAGNOSIS — M25561 Pain in right knee: Secondary | ICD-10-CM | POA: Diagnosis not present

## 2021-08-01 DIAGNOSIS — E119 Type 2 diabetes mellitus without complications: Secondary | ICD-10-CM | POA: Diagnosis not present

## 2021-08-01 DIAGNOSIS — M5136 Other intervertebral disc degeneration, lumbar region: Secondary | ICD-10-CM | POA: Diagnosis not present

## 2021-08-01 DIAGNOSIS — G8929 Other chronic pain: Secondary | ICD-10-CM | POA: Diagnosis not present

## 2021-08-01 DIAGNOSIS — E782 Mixed hyperlipidemia: Secondary | ICD-10-CM | POA: Diagnosis not present

## 2021-08-01 DIAGNOSIS — M4316 Spondylolisthesis, lumbar region: Secondary | ICD-10-CM | POA: Diagnosis not present

## 2021-08-01 DIAGNOSIS — S72001D Fracture of unspecified part of neck of right femur, subsequent encounter for closed fracture with routine healing: Secondary | ICD-10-CM | POA: Diagnosis not present

## 2021-08-01 NOTE — Patient Outreach (Addendum)
[facility]) Care Management  08/01/2021  [name] 1941-08-22 [id number]  Transition of care follow up call      Referral received: 7/6- Notified of Discharge 07/01/21  Initial outreach:07/01/21 Insurance: [place]   Subjective: Successful outreach call to patient , she reports doing okay on today. She discussed home health therapy visit on today and states she is doing good, steady progress. She reports therapist to contact PCP office again on today regarding patient need for beside commode.  Patient discussed having upcoming appointment with PCP on next week she states that her son , daughter are unable to provide transportation, she will ask her friend but unsure that she will be able to provide transportation.  Patient reports managing okay at home, using a cane,improved appetite and intake. She  reports feeling a little down on today because her clothes dryer broke,she discussed that she has called a repairman.  Objective [name] was hospitalized at [facility] , 7/5-7/14 , Dx: Fall at home, Right closed hip fracture, s/p hemiarthroplasty on 05/21/21. She was discharged to [facility] on 7/14, Per electronic medical record she left SNF care AMA, patient able to unable state specific date she left facility . Patient reports leaving facility early Comorbidities include: Diabetes A1c 6.6%  Patient reports  having rolling walker and cane for use at home.  Plan Will plan follow up call to identify  if available patient resources of [place] transportation for MD appointment 08/06/21 at 0945, patient is agreeable to assistance if available. will send email message to [employer] Will plan outreach call in the next business day for follow up.   Joylene Draft, RN, BSN  [place] Management Coordinator  [phone]- Mobile [phone]- Toll Free Main Office

## 2021-08-04 ENCOUNTER — Other Ambulatory Visit: Payer: Self-pay | Admitting: *Deleted

## 2021-08-04 DIAGNOSIS — E119 Type 2 diabetes mellitus without complications: Secondary | ICD-10-CM | POA: Diagnosis not present

## 2021-08-04 DIAGNOSIS — M2041 Other hammer toe(s) (acquired), right foot: Secondary | ICD-10-CM | POA: Diagnosis not present

## 2021-08-04 DIAGNOSIS — J449 Chronic obstructive pulmonary disease, unspecified: Secondary | ICD-10-CM | POA: Diagnosis not present

## 2021-08-04 DIAGNOSIS — S72001D Fracture of unspecified part of neck of right femur, subsequent encounter for closed fracture with routine healing: Secondary | ICD-10-CM | POA: Diagnosis not present

## 2021-08-04 DIAGNOSIS — M5136 Other intervertebral disc degeneration, lumbar region: Secondary | ICD-10-CM | POA: Diagnosis not present

## 2021-08-04 DIAGNOSIS — G8929 Other chronic pain: Secondary | ICD-10-CM | POA: Diagnosis not present

## 2021-08-04 DIAGNOSIS — M25561 Pain in right knee: Secondary | ICD-10-CM | POA: Diagnosis not present

## 2021-08-04 DIAGNOSIS — M4316 Spondylolisthesis, lumbar region: Secondary | ICD-10-CM | POA: Diagnosis not present

## 2021-08-04 DIAGNOSIS — E782 Mixed hyperlipidemia: Secondary | ICD-10-CM | POA: Diagnosis not present

## 2021-08-04 NOTE — Patient Outreach (Signed)
[facility]) Care Management  08/04/2021  Breanna Petty 03/01/1941 [id number]   Transition of care/follow up [place] Coordination    Referral received: 7/6- Notified of Discharge 07/01/21  Initial outreach:07/01/21 Insurance: [place]   Subjective: Successful outreach call to patient , she reports doing okay, using cane for mobility, wearing her medical alert necklace. She has follow up home health PT on today.  Patient discussed that her friend will not be able to assist with transporation to appointment on this week and other family not available.   Discussed with patient being able to arrange with transportaton to appointment with benefit of [facility] transportation she is agreeable.  Patient also wants clarification as to when she can drive self to appointments, she admits that she has been driving short distance , to church on Sunday,  she is agreeable to phone call to [facility], and will notify PCP office as she has a visit this week.   Placed call to [facility] home health , Adult nurse per notes patient will benefit from ongoing follow up for balance concerns.   Plan Will send [facility] transportation email message regarding transporation need, appointment details.  Will plan follow up call to patient in the next day to verify transporation arrangements.    Joylene Draft, RN, BSN  [place] Management Coordinator  [phone]- Mobile [phone]- Toll Free Main Office

## 2021-08-06 ENCOUNTER — Other Ambulatory Visit: Payer: Self-pay

## 2021-08-06 DIAGNOSIS — Z0000 Encounter for general adult medical examination without abnormal findings: Secondary | ICD-10-CM | POA: Diagnosis not present

## 2021-08-06 DIAGNOSIS — I700 Atherosclerosis of aorta: Secondary | ICD-10-CM | POA: Diagnosis not present

## 2021-08-06 DIAGNOSIS — E118 Type 2 diabetes mellitus with unspecified complications: Secondary | ICD-10-CM | POA: Diagnosis not present

## 2021-08-06 DIAGNOSIS — I10 Essential (primary) hypertension: Secondary | ICD-10-CM | POA: Diagnosis not present

## 2021-08-06 DIAGNOSIS — I2510 Atherosclerotic heart disease of native coronary artery without angina pectoris: Secondary | ICD-10-CM | POA: Diagnosis not present

## 2021-08-06 DIAGNOSIS — Z23 Encounter for immunization: Secondary | ICD-10-CM | POA: Diagnosis not present

## 2021-08-06 DIAGNOSIS — E782 Mixed hyperlipidemia: Secondary | ICD-10-CM | POA: Diagnosis not present

## 2021-08-06 DIAGNOSIS — Z8781 Personal history of (healed) traumatic fracture: Secondary | ICD-10-CM | POA: Diagnosis not present

## 2021-08-06 MED ORDER — OXYBUTYNIN CHLORIDE ER 5 MG PO TB24
ORAL_TABLET | ORAL | 11 refills | Status: AC
  Filled 2021-08-06 – 2021-08-20 (×2): qty 30, 30d supply, fill #0

## 2021-08-07 ENCOUNTER — Other Ambulatory Visit: Payer: Self-pay | Admitting: *Deleted

## 2021-08-07 DIAGNOSIS — M4316 Spondylolisthesis, lumbar region: Secondary | ICD-10-CM | POA: Diagnosis not present

## 2021-08-07 DIAGNOSIS — J449 Chronic obstructive pulmonary disease, unspecified: Secondary | ICD-10-CM | POA: Diagnosis not present

## 2021-08-07 DIAGNOSIS — M2041 Other hammer toe(s) (acquired), right foot: Secondary | ICD-10-CM | POA: Diagnosis not present

## 2021-08-07 DIAGNOSIS — G8929 Other chronic pain: Secondary | ICD-10-CM | POA: Diagnosis not present

## 2021-08-07 DIAGNOSIS — E119 Type 2 diabetes mellitus without complications: Secondary | ICD-10-CM | POA: Diagnosis not present

## 2021-08-07 DIAGNOSIS — M5136 Other intervertebral disc degeneration, lumbar region: Secondary | ICD-10-CM | POA: Diagnosis not present

## 2021-08-07 DIAGNOSIS — S72001D Fracture of unspecified part of neck of right femur, subsequent encounter for closed fracture with routine healing: Secondary | ICD-10-CM | POA: Diagnosis not present

## 2021-08-07 DIAGNOSIS — M25561 Pain in right knee: Secondary | ICD-10-CM | POA: Diagnosis not present

## 2021-08-07 DIAGNOSIS — E782 Mixed hyperlipidemia: Secondary | ICD-10-CM | POA: Diagnosis not present

## 2021-08-07 NOTE — Patient Outreach (Signed)
[facility]) Care Management  08/07/2021  Breanna Petty [birth year] [id number]   Transition of care follow up call        Referral received: 7/6- Notified of Discharge 07/01/21  Initial outreach:07/01/21 Insurance: [place]    Subjective: Unsuccessful outreach call to patient , to follow up on if she has received a return call from [facility] regarding her question as to when she can resume driving,no answer able to leave a HIPAA compliant voicemail message.    Plan Will plan return call in the next week for follow up if no return call on today. Anticipate case closure if no additional care needs to address. Patient is active with Disease management program with Active health management.    Joylene Draft, RN, BSN  [place] Management Coordinator  [phone]- Mobile [phone]- Toll Free Main Office

## 2021-08-08 DIAGNOSIS — E119 Type 2 diabetes mellitus without complications: Secondary | ICD-10-CM | POA: Diagnosis not present

## 2021-08-08 DIAGNOSIS — J449 Chronic obstructive pulmonary disease, unspecified: Secondary | ICD-10-CM | POA: Diagnosis not present

## 2021-08-08 DIAGNOSIS — S72001D Fracture of unspecified part of neck of right femur, subsequent encounter for closed fracture with routine healing: Secondary | ICD-10-CM | POA: Diagnosis not present

## 2021-08-08 DIAGNOSIS — M5136 Other intervertebral disc degeneration, lumbar region: Secondary | ICD-10-CM | POA: Diagnosis not present

## 2021-08-08 DIAGNOSIS — M2041 Other hammer toe(s) (acquired), right foot: Secondary | ICD-10-CM | POA: Diagnosis not present

## 2021-08-08 DIAGNOSIS — E782 Mixed hyperlipidemia: Secondary | ICD-10-CM | POA: Diagnosis not present

## 2021-08-08 DIAGNOSIS — M4316 Spondylolisthesis, lumbar region: Secondary | ICD-10-CM | POA: Diagnosis not present

## 2021-08-08 DIAGNOSIS — G8929 Other chronic pain: Secondary | ICD-10-CM | POA: Diagnosis not present

## 2021-08-08 DIAGNOSIS — M25561 Pain in right knee: Secondary | ICD-10-CM | POA: Diagnosis not present

## 2021-08-13 DIAGNOSIS — E119 Type 2 diabetes mellitus without complications: Secondary | ICD-10-CM | POA: Diagnosis not present

## 2021-08-13 DIAGNOSIS — G8929 Other chronic pain: Secondary | ICD-10-CM | POA: Diagnosis not present

## 2021-08-13 DIAGNOSIS — M5136 Other intervertebral disc degeneration, lumbar region: Secondary | ICD-10-CM | POA: Diagnosis not present

## 2021-08-13 DIAGNOSIS — J449 Chronic obstructive pulmonary disease, unspecified: Secondary | ICD-10-CM | POA: Diagnosis not present

## 2021-08-13 DIAGNOSIS — E782 Mixed hyperlipidemia: Secondary | ICD-10-CM | POA: Diagnosis not present

## 2021-08-13 DIAGNOSIS — S72001D Fracture of unspecified part of neck of right femur, subsequent encounter for closed fracture with routine healing: Secondary | ICD-10-CM | POA: Diagnosis not present

## 2021-08-13 DIAGNOSIS — M4316 Spondylolisthesis, lumbar region: Secondary | ICD-10-CM | POA: Diagnosis not present

## 2021-08-13 DIAGNOSIS — M25561 Pain in right knee: Secondary | ICD-10-CM | POA: Diagnosis not present

## 2021-08-13 DIAGNOSIS — M2041 Other hammer toe(s) (acquired), right foot: Secondary | ICD-10-CM | POA: Diagnosis not present

## 2021-08-14 ENCOUNTER — Other Ambulatory Visit: Payer: Self-pay | Admitting: *Deleted

## 2021-08-14 NOTE — Patient Outreach (Addendum)
[facility]) Care Management  08/14/2021  Breanna Petty 1941/11/02 [id number]   Transition of care follow up call.    Referral received: 7/6- Notified of Discharge 07/01/21  Initial outreach:07/01/21 Insurance: [place]; Unsuccessful follow up call to patient, no answer able to leave a HIPAA voicemail message  Plan If no return call will schedule 3rd outreach call in the next 4 business days and plan case closure patient enrolled in Disease management program with Active health management.     Joylene Draft, RN, BSN  [place] Management Coordinator  [phone]- Mobile [phone]- Toll Free Main Office

## 2021-08-15 DIAGNOSIS — M5136 Other intervertebral disc degeneration, lumbar region: Secondary | ICD-10-CM | POA: Diagnosis not present

## 2021-08-15 DIAGNOSIS — M4316 Spondylolisthesis, lumbar region: Secondary | ICD-10-CM | POA: Diagnosis not present

## 2021-08-15 DIAGNOSIS — G8929 Other chronic pain: Secondary | ICD-10-CM | POA: Diagnosis not present

## 2021-08-15 DIAGNOSIS — S72001D Fracture of unspecified part of neck of right femur, subsequent encounter for closed fracture with routine healing: Secondary | ICD-10-CM | POA: Diagnosis not present

## 2021-08-15 DIAGNOSIS — E782 Mixed hyperlipidemia: Secondary | ICD-10-CM | POA: Diagnosis not present

## 2021-08-15 DIAGNOSIS — E119 Type 2 diabetes mellitus without complications: Secondary | ICD-10-CM | POA: Diagnosis not present

## 2021-08-15 DIAGNOSIS — M25561 Pain in right knee: Secondary | ICD-10-CM | POA: Diagnosis not present

## 2021-08-15 DIAGNOSIS — J449 Chronic obstructive pulmonary disease, unspecified: Secondary | ICD-10-CM | POA: Diagnosis not present

## 2021-08-15 DIAGNOSIS — M2041 Other hammer toe(s) (acquired), right foot: Secondary | ICD-10-CM | POA: Diagnosis not present

## 2021-08-19 ENCOUNTER — Other Ambulatory Visit: Payer: Self-pay | Admitting: *Deleted

## 2021-08-19 ENCOUNTER — Other Ambulatory Visit: Payer: Self-pay

## 2021-08-19 DIAGNOSIS — E119 Type 2 diabetes mellitus without complications: Secondary | ICD-10-CM | POA: Diagnosis not present

## 2021-08-19 DIAGNOSIS — J449 Chronic obstructive pulmonary disease, unspecified: Secondary | ICD-10-CM | POA: Diagnosis not present

## 2021-08-19 DIAGNOSIS — M5136 Other intervertebral disc degeneration, lumbar region: Secondary | ICD-10-CM | POA: Diagnosis not present

## 2021-08-19 DIAGNOSIS — S72001D Fracture of unspecified part of neck of right femur, subsequent encounter for closed fracture with routine healing: Secondary | ICD-10-CM | POA: Diagnosis not present

## 2021-08-19 NOTE — Patient Outreach (Signed)
[facility]) Care Management  08/19/2021  ELIZAH LYDON Mar 05, 1941 [id number]   Transition of care /Case Closure Unsuccessful outreach    Referral received:05/21/21, notified of discharge 07/01/21 Initial outreach:07/01/21 Insurance: [place]    Unable to complete follow up  transition of care calls.  No return call from patient after 3 call attempts.   Objective: Minami Arriaga was hospitalized at [facility] , 7/5-7/14 , Dx: Fall at home, Right closed hip fracture, s/p hemiarthroplasty on 05/21/21. She was discharged to [facility] on 7/14, Per electronic medical record she left SNF care AMA, patient able to unable state specific date she left facility . Patient reports leaving facility early Comorbidities include: Diabetes A1c 6.6%  Patient reports  having rolling walker and cane for use at home..   Plan  Placed call to [facility] home health representative verifies patient still active with home health PT, recent visit 08/16/21. Noted patient with PCP visit on 10/5,  Unsuccessful follow up outreach attempts , will close case to [facility] care management .       Joylene Draft, RN, BSN  [place] Management Coordinator  [phone]- Mobile [phone]- Toll Free Main Office

## 2021-08-20 ENCOUNTER — Other Ambulatory Visit: Payer: Self-pay

## 2021-08-20 DIAGNOSIS — Z23 Encounter for immunization: Secondary | ICD-10-CM | POA: Diagnosis not present

## 2021-08-20 DIAGNOSIS — M62838 Other muscle spasm: Secondary | ICD-10-CM | POA: Diagnosis not present

## 2021-08-20 DIAGNOSIS — E118 Type 2 diabetes mellitus with unspecified complications: Secondary | ICD-10-CM | POA: Diagnosis not present

## 2021-08-20 DIAGNOSIS — N3281 Overactive bladder: Secondary | ICD-10-CM | POA: Diagnosis not present

## 2021-08-21 ENCOUNTER — Other Ambulatory Visit: Payer: Self-pay | Admitting: *Deleted

## 2021-08-21 DIAGNOSIS — M25561 Pain in right knee: Secondary | ICD-10-CM | POA: Diagnosis not present

## 2021-08-21 DIAGNOSIS — E782 Mixed hyperlipidemia: Secondary | ICD-10-CM | POA: Diagnosis not present

## 2021-08-21 DIAGNOSIS — J449 Chronic obstructive pulmonary disease, unspecified: Secondary | ICD-10-CM | POA: Diagnosis not present

## 2021-08-21 DIAGNOSIS — M2041 Other hammer toe(s) (acquired), right foot: Secondary | ICD-10-CM | POA: Diagnosis not present

## 2021-08-21 DIAGNOSIS — M4316 Spondylolisthesis, lumbar region: Secondary | ICD-10-CM | POA: Diagnosis not present

## 2021-08-21 DIAGNOSIS — G8929 Other chronic pain: Secondary | ICD-10-CM | POA: Diagnosis not present

## 2021-08-21 DIAGNOSIS — E119 Type 2 diabetes mellitus without complications: Secondary | ICD-10-CM | POA: Diagnosis not present

## 2021-08-21 DIAGNOSIS — M5136 Other intervertebral disc degeneration, lumbar region: Secondary | ICD-10-CM | POA: Diagnosis not present

## 2021-08-21 DIAGNOSIS — S72001D Fracture of unspecified part of neck of right femur, subsequent encounter for closed fracture with routine healing: Secondary | ICD-10-CM | POA: Diagnosis not present

## 2021-08-21 NOTE — Patient Outreach (Signed)
[place] [facility]) Care Management  08/21/2021  Breanna Petty Mar 21, 1941 [id number]   This encounter was created in error - please disregard.   Joylene Draft, RN, BSN  [place] Management Coordinator  [phone]- Mobile [phone]- Toll Free Main Office

## 2021-08-23 MED FILL — Latanoprost Ophth Soln 0.005%: OPHTHALMIC | 50 days supply | Qty: 5 | Fill #1 | Status: AC

## 2021-08-25 ENCOUNTER — Other Ambulatory Visit: Payer: Self-pay

## 2021-08-28 ENCOUNTER — Other Ambulatory Visit: Payer: Self-pay

## 2021-08-28 DIAGNOSIS — E119 Type 2 diabetes mellitus without complications: Secondary | ICD-10-CM | POA: Diagnosis not present

## 2021-08-28 MED ORDER — LATANOPROST 0.005 % OP SOLN
OPHTHALMIC | 3 refills | Status: AC
  Filled 2021-08-28 – 2021-10-22 (×2): qty 5, 30d supply, fill #0

## 2021-08-30 ENCOUNTER — Telehealth: Payer: Self-pay

## 2021-08-30 ENCOUNTER — Other Ambulatory Visit: Payer: Self-pay

## 2021-08-30 ENCOUNTER — Ambulatory Visit
Admission: EM | Admit: 2021-08-30 | Discharge: 2021-08-30 | Disposition: A | Discharge status: Home / Self Care | Payer: 59 | Attending: Physician Assistant | Admitting: Physician Assistant

## 2021-08-30 DIAGNOSIS — R0981 Nasal congestion: Secondary | ICD-10-CM | POA: Diagnosis not present

## 2021-08-30 DIAGNOSIS — R0982 Postnasal drip: Secondary | ICD-10-CM

## 2021-08-30 MED ORDER — FLUTICASONE PROPIONATE 50 MCG/ACT NA SUSP: 2.0000 | Freq: Every day | NASAL | 0 refills | Status: DC

## 2021-08-30 MED ORDER — LORATADINE 10 MG PO TABS: 10.0000 mg | ORAL_TABLET | Freq: Every day | ORAL | 0 refills | Status: DC

## 2021-08-30 NOTE — ED Triage Notes (Signed)
Pt here with C/O nasal congestion, feels like she is choking when laying down at night. No other SX.

## 2021-08-30 NOTE — ED Provider Notes (Signed)
[facility]    CSN: [id number] Arrival date & time: 08/30/21  1442      History   Chief Complaint Chief Complaint  Patient presents with   Nasal Congestion    HPI Breanna Petty is a 80 y.o. female presenting for nasal congestion and postnasal drainage for the past few days.  She denies fever, fatigue, aches, cough, sore throat, chest pain or breathing difficulty.  No vomiting or diarrhea.  No sick contacts or known exposure to COVID-19.  Has not taken any medication for symptoms.  No other complaints.  HPI  Past Medical History:  Diagnosis Date   Acute diverticulitis 10/02/2016   Anginal pain (HCC)    Anxiety    Aortic atherosclerosis ([place])    Arthritis    Atherosclerotic heart disease    Depression    Diabetes mellitus without complication ([place])    Diverticulosis of colon    Dysrhythmia    GERD (gastroesophageal reflux disease)    Hiatal hernia    History of heart artery stent    History of shingles    HOH (hard of hearing)    Hyperlipidemia    Hypertension    Myocardial infarction [facility])    Sarcoidosis    Vitamin D deficiency     Patient Active Problem List   Diagnosis Date Noted   Closed right hip fracture ([place]) 05/20/2021   CAD (coronary artery disease) 05/20/2021   HTN (hypertension) 05/20/2021   Type II diabetes mellitus with renal manifestations ([place]) 05/20/2021   CKD (chronic kidney disease), stage IIIa 05/20/2021   Fall at home, initial encounter 05/20/2021   Status post exploratory laparotomy 10/21/2016   Myocardial infarction ([place]) 10/20/2016   SBO (small bowel obstruction) (HCC)    Aortic atherosclerosis ([place]) 10/02/2016   Diverticulosis of large intestine without hemorrhage 10/02/2016   Hammer toe of right foot 10/16/2015   Nail deformity 10/16/2015   Chronic obstructive pulmonary disease ([place]) 09/04/2015   History of MI (myocardial infarction) 09/04/2015   Spondylolisthesis at L4-L5 level 07/11/2015   DDD (degenerative disc  disease), lumbar 07/11/2015   History of sarcoidosis 06/04/2015   Urge incontinence of urine 06/04/2015   Hearing loss of both ears 05/07/2015   Anxiety and depression 05/04/2015   Arteriosclerosis of coronary artery 05/04/2015   Type 2 diabetes mellitus with microalbuminuria, without long-term current use of insulin ([place]) 05/04/2015   Dyslipidemia 05/04/2015   Hypertension, benign 05/04/2015   Gastro-esophageal reflux disease without esophagitis 05/04/2015   Hiatal hernia 05/04/2015   HZV (herpes zoster virus) post herpetic neuralgia 05/04/2015   Vitamin D deficiency 05/04/2015   S/P coronary artery stent placement 05/04/2015    Past Surgical History:  Procedure Laterality Date   ABDOMINAL HYSTERECTOMY  1979   APPENDECTOMY     CARDIAC SURGERY     CATARACT EXTRACTION W/PHACO Right 10/28/2017   Procedure: CATARACT EXTRACTION PHACO AND INTRAOCULAR LENS PLACEMENT ([place]);  Surgeon: Eulogio Bear, MD;  Location: [facility] ORS;  Service: Ophthalmology;  Laterality: Right;  Lot # [id number] H [place]: 00:53.9 AP%: 10.6 CDE: 5.71   CORONARY ANGIOPLASTY     STENT   HIP ARTHROPLASTY Right 05/21/2021   Procedure: ARTHROPLASTY BIPOLAR HIP (HEMIARTHROPLASTY);  Surgeon: Renee Harder, MD;  Location: [facility] ORS;  Service: Orthopedics;  Laterality: Right;   LAPAROTOMY N/A 10/04/2016   Procedure: EXPLORATORY LAPAROTOMY;  Surgeon: Dia Crawford III, MD;  Location: [facility] ORS;  Service: General;  Laterality: N/A;    OB History   No obstetric history on  file.      Home Medications    Prior to Admission medications   Medication Sig Start Date End Date Taking? Authorizing Provider  albuterol (VENTOLIN HFA) 108 (90 Base) MCG/ACT inhaler Inhale 2 puffs into the lungs every 4 (four) hours as needed for wheezing or shortness of breath. 05/29/21  Yes Hosie Poisson, MD  amLODipine (NORVASC) 2.5 MG tablet Take 1 tablet (2.5 mg total) by mouth daily. 05/29/21  Yes Hosie Poisson, MD  aspirin EC 81 MG tablet Take 1  tablet (81 mg total) by mouth daily. 12/29/16  Yes Juline Patch, MD  dextromethorphan-guaiFENesin [facility] DM) 30-600 MG 12hr tablet Take 1 tablet by mouth 2 (two) times daily as needed for cough. 05/29/21  Yes Hosie Poisson, MD  fluticasone (FLONASE) 50 MCG/ACT nasal spray Place 2 sprays into both nostrils daily. 08/30/21  Yes Laurene Footman B, PA-C  glucose blood (FREESTYLE LITE) test strip USE AS INSTRUCTED 11/13/19  Yes Hubbard Hartshorn, FNP  ipratropium (ATROVENT) 0.06 % nasal spray Place 2 sprays into both nostrils 4 (four) times daily as needed for rhinitis. 07/06/20  Yes Cook, Jayce G, DO  Lancets (FREESTYLE) lancets TEST 2 TIMES A DAY 12/23/20 12/23/21 Yes Whitaker, Corene Cornea Hestle, PA-C  latanoprost (XALATAN) 0.005 % ophthalmic solution PLACE 1 DROP INTO BOTH EYES AT BEDTIME 08/28/21  Yes   loratadine (CLARITIN) 10 MG tablet Take 1 tablet (10 mg total) by mouth daily for 15 days. 08/30/21 09/14/21 Yes Danton Clap, PA-C  losartan (COZAAR) 50 MG tablet Take 1 tablet (50 mg total) by mouth daily. 12/23/18  Yes Juline Patch, MD  mirabegron ER (MYRBETRIQ) 50 MG TB24 tablet Take 1 tablet (50 mg total) by mouth daily. 04/07/21  Yes MacDiarmid, Nicki Reaper, MD  Multiple Vitamin (MULTIVITAMIN) capsule Take 1 capsule by mouth daily.   Yes [provider]  NITROSTAT 0.4 MG SL tablet DISSOLVE ONE TABLET UNDER TONGUE AS NEED FOR CHEST PAIN AS DIRECTED 11/28/15  Yes Sowles, Drue Stager, MD  oxybutynin (DITROPAN-XL) 5 MG 24 hr tablet Take 1 tablet (5 mg total) by mouth once daily 08/06/21  Yes   pantoprazole (PROTONIX) 40 MG tablet TAKE 1 TABLET BY MOUTH 2 TIMES DAILY BEFORE MEALS 10/31/20 10/31/21 Yes Whitaker, Jason Hestle, PA-C  pantoprazole (PROTONIX) 40 MG tablet Take 1 tablet (40 mg total) by mouth 2 (two) times daily before a meal. 06/30/21  Yes   polyethylene glycol (MIRALAX / GLYCOLAX) 17 g packet Take 17 g by mouth daily as needed. 05/29/21  Yes Hosie Poisson, MD  pravastatin (PRAVACHOL) 80 MG tablet Take  1 tablet (80 mg total) by mouth nightly 03/20/21  Yes   pravastatin (PRAVACHOL) 80 MG tablet Take 1 tablet (80 mg total) by mouth at bedtime 06/30/21  Yes   senna-docusate (SENOKOT-S) 8.6-50 MG tablet Take 1 tablet by mouth at bedtime as needed for mild constipation. 05/29/21  Yes Hosie Poisson, MD  sitaGLIPtin (JANUVIA) 100 MG tablet TAKE 1 TABLET (100 MG TOTAL) BY MOUTH ONCE DAILY 03/19/21 03/19/22 Yes   enoxaparin (LOVENOX) 40 MG/0.4ML injection Inject 0.4 mLs (40 mg total) into the skin daily for 7 days. 05/29/21 06/05/21  Hosie Poisson, MD    Family History Family History  Problem Relation Age of Onset   Diabetes Daughter    Hypertension Mother    Other Father        MVA   Diabetes Son    Breast cancer Neg Hx     Social History Social History  Tobacco Use   Smoking status: Former    Packs/day: 2.00    Years: 10.00    Pack years: 20.00    Types: Cigarettes    Quit date: 11/16/1988    Years since quitting: 32.8   Smokeless tobacco: Never  Vaping Use   Vaping Use: Never used  Substance Use Topics   Alcohol use: No    Alcohol/week: 0.0 standard drinks   Drug use: No     Allergies   Ace inhibitors, Codeine, Codeine sulfate, Ezetimibe, and Other   Review of Systems Review of Systems  Constitutional:  Negative for chills, diaphoresis, fatigue and fever.  HENT:  Positive for congestion, postnasal drip and rhinorrhea. Negative for ear pain, sinus pressure, sinus pain and sore throat.   Respiratory:  Negative for cough and shortness of breath.   Gastrointestinal:  Negative for abdominal pain, nausea and vomiting.  Musculoskeletal:  Negative for arthralgias and myalgias.  Skin:  Negative for rash.  Neurological:  Negative for weakness and headaches.  Hematological:  Negative for adenopathy.    Physical Exam Triage Vital Signs ED Triage Vitals  Enc Vitals Group     BP 08/30/21 1456 (!) 158/74     Pulse Rate 08/30/21 1456 79     Resp 08/30/21 1456 16     Temp 08/30/21 1456  98.5 F (36.9 C)     Temp Source 08/30/21 1456 Oral     SpO2 08/30/21 1456 100 %     Weight 08/30/21 1455 160 lb (72.6 kg)     Height 08/30/21 1455 5\' 11"  (1.803 m)     Head Circumference --      Peak Flow --      Pain Score 08/30/21 1455 0     Pain Loc --      Pain Edu? --      Excl. in [place]? --    No data found.  Updated Vital Signs BP (!) 158/74 (BP Location: Left Arm)   Pulse 79   Temp 98.5 F (36.9 C) (Oral)   Resp 16   Ht 5\' 11"  (1.803 m)   Wt 160 lb (72.6 kg)   SpO2 100%   BMI 22.32 kg/m      Physical Exam Vitals and nursing note reviewed.  Constitutional:      General: She is not in acute distress.    Appearance: Normal appearance. She is not ill-appearing or toxic-appearing.  HENT:     Head: Normocephalic and atraumatic.     Nose: Congestion present.     Mouth/Throat:     Mouth: Mucous membranes are moist.     Pharynx: Oropharynx is clear. Posterior oropharyngeal erythema (mild with clear PND) present.  Eyes:     General: No scleral icterus.       Right eye: No discharge.        Left eye: No discharge.     Conjunctiva/sclera: Conjunctivae normal.  Cardiovascular:     Rate and Rhythm: Normal rate and regular rhythm.     Heart sounds: Normal heart sounds.  Pulmonary:     Effort: Pulmonary effort is normal. No respiratory distress.     Breath sounds: Normal breath sounds.  Musculoskeletal:     Cervical back: Neck supple.  Skin:    General: Skin is dry.  Neurological:     General: No focal deficit present.     Mental Status: She is alert. Mental status is at baseline.     Motor: No weakness.  Gait: Gait normal.  Psychiatric:        Mood and Affect: Mood normal.        Behavior: Behavior normal.        Thought Content: Thought content normal.     UC Treatments / Results  Labs (all labs ordered are listed, but only abnormal results are displayed) Labs Reviewed - No data to display  EKG   Radiology No results  found.  Procedures Procedures (including critical care time)  Medications Ordered in UC Medications - No data to display  Initial Impression / Assessment and Plan / UC Course  I have reviewed the triage vital signs and the nursing notes.  Pertinent labs & imaging results that were available during my care of the patient were reviewed by me and considered in my medical decision making (see chart for details).  80 year old female presenting for nasal congestion and postnasal drainage for the past few days.  Has not tried any OTC meds.  No COVID exposure.  No red flag signs or symptoms.  Patient afebrile and well-appearing.  She does have significant nasal congestion and postnasal drainage.  Chest clear to auscultation heart regular rate and rhythm.  Symptoms likely due to allergies.  I have sent Flonase and Claritin.  Encouraged to increase rest and fluids and follow-up for any worsening symptoms or if not feeling better with these medications the next week.  Final Clinical Impressions(s) / UC Diagnoses   Final diagnoses:  Nasal congestion  Post-nasal drip   Discharge Instructions   None    ED Prescriptions     Medication Sig Dispense Auth. Provider   fluticasone (FLONASE) 50 MCG/ACT nasal spray Place 2 sprays into both nostrils daily. 1 g Laurene Footman B, PA-C   loratadine (CLARITIN) 10 MG tablet Take 1 tablet (10 mg total) by mouth daily for 15 days. 15 tablet Gretta Cool      PDMP not reviewed this encounter.   Danton Clap, PA-C 08/30/21 1606

## 2021-09-01 ENCOUNTER — Other Ambulatory Visit: Payer: Self-pay

## 2021-09-01 DIAGNOSIS — M5136 Other intervertebral disc degeneration, lumbar region: Secondary | ICD-10-CM | POA: Diagnosis not present

## 2021-09-01 DIAGNOSIS — M9902 Segmental and somatic dysfunction of thoracic region: Secondary | ICD-10-CM | POA: Diagnosis not present

## 2021-09-01 DIAGNOSIS — M546 Pain in thoracic spine: Secondary | ICD-10-CM | POA: Diagnosis not present

## 2021-09-01 DIAGNOSIS — M531 Cervicobrachial syndrome: Secondary | ICD-10-CM | POA: Diagnosis not present

## 2021-09-01 DIAGNOSIS — M6080 Other myositis, unspecified site: Secondary | ICD-10-CM | POA: Diagnosis not present

## 2021-09-01 DIAGNOSIS — M62830 Muscle spasm of back: Secondary | ICD-10-CM | POA: Diagnosis not present

## 2021-09-01 DIAGNOSIS — M9905 Segmental and somatic dysfunction of pelvic region: Secondary | ICD-10-CM | POA: Diagnosis not present

## 2021-09-01 DIAGNOSIS — M9901 Segmental and somatic dysfunction of cervical region: Secondary | ICD-10-CM | POA: Diagnosis not present

## 2021-09-01 DIAGNOSIS — M9903 Segmental and somatic dysfunction of lumbar region: Secondary | ICD-10-CM | POA: Diagnosis not present

## 2021-09-02 DIAGNOSIS — G8929 Other chronic pain: Secondary | ICD-10-CM | POA: Diagnosis not present

## 2021-09-02 DIAGNOSIS — M25561 Pain in right knee: Secondary | ICD-10-CM | POA: Diagnosis not present

## 2021-09-02 DIAGNOSIS — E119 Type 2 diabetes mellitus without complications: Secondary | ICD-10-CM | POA: Diagnosis not present

## 2021-09-02 DIAGNOSIS — M2041 Other hammer toe(s) (acquired), right foot: Secondary | ICD-10-CM | POA: Diagnosis not present

## 2021-09-02 DIAGNOSIS — M4316 Spondylolisthesis, lumbar region: Secondary | ICD-10-CM | POA: Diagnosis not present

## 2021-09-02 DIAGNOSIS — M5136 Other intervertebral disc degeneration, lumbar region: Secondary | ICD-10-CM | POA: Diagnosis not present

## 2021-09-02 DIAGNOSIS — S72001D Fracture of unspecified part of neck of right femur, subsequent encounter for closed fracture with routine healing: Secondary | ICD-10-CM | POA: Diagnosis not present

## 2021-09-02 DIAGNOSIS — E782 Mixed hyperlipidemia: Secondary | ICD-10-CM | POA: Diagnosis not present

## 2021-09-02 DIAGNOSIS — J449 Chronic obstructive pulmonary disease, unspecified: Secondary | ICD-10-CM | POA: Diagnosis not present

## 2021-09-04 ENCOUNTER — Other Ambulatory Visit: Payer: Self-pay

## 2021-09-05 DIAGNOSIS — M5136 Other intervertebral disc degeneration, lumbar region: Secondary | ICD-10-CM | POA: Diagnosis not present

## 2021-09-05 DIAGNOSIS — M25561 Pain in right knee: Secondary | ICD-10-CM | POA: Diagnosis not present

## 2021-09-05 DIAGNOSIS — E782 Mixed hyperlipidemia: Secondary | ICD-10-CM | POA: Diagnosis not present

## 2021-09-05 DIAGNOSIS — E119 Type 2 diabetes mellitus without complications: Secondary | ICD-10-CM | POA: Diagnosis not present

## 2021-09-05 DIAGNOSIS — G8929 Other chronic pain: Secondary | ICD-10-CM | POA: Diagnosis not present

## 2021-09-05 DIAGNOSIS — J449 Chronic obstructive pulmonary disease, unspecified: Secondary | ICD-10-CM | POA: Diagnosis not present

## 2021-09-05 DIAGNOSIS — M2041 Other hammer toe(s) (acquired), right foot: Secondary | ICD-10-CM | POA: Diagnosis not present

## 2021-09-05 DIAGNOSIS — M4316 Spondylolisthesis, lumbar region: Secondary | ICD-10-CM | POA: Diagnosis not present

## 2021-09-05 DIAGNOSIS — S72001D Fracture of unspecified part of neck of right femur, subsequent encounter for closed fracture with routine healing: Secondary | ICD-10-CM | POA: Diagnosis not present

## 2021-09-11 ENCOUNTER — Other Ambulatory Visit (HOSPITAL_COMMUNITY): Payer: Self-pay

## 2021-09-15 DIAGNOSIS — M25551 Pain in right hip: Secondary | ICD-10-CM | POA: Diagnosis not present

## 2021-09-16 DIAGNOSIS — J449 Chronic obstructive pulmonary disease, unspecified: Secondary | ICD-10-CM | POA: Diagnosis not present

## 2021-09-16 DIAGNOSIS — S72001D Fracture of unspecified part of neck of right femur, subsequent encounter for closed fracture with routine healing: Secondary | ICD-10-CM | POA: Diagnosis not present

## 2021-09-16 DIAGNOSIS — M25561 Pain in right knee: Secondary | ICD-10-CM | POA: Diagnosis not present

## 2021-09-16 DIAGNOSIS — M5136 Other intervertebral disc degeneration, lumbar region: Secondary | ICD-10-CM | POA: Diagnosis not present

## 2021-09-16 DIAGNOSIS — M4316 Spondylolisthesis, lumbar region: Secondary | ICD-10-CM | POA: Diagnosis not present

## 2021-09-16 DIAGNOSIS — E119 Type 2 diabetes mellitus without complications: Secondary | ICD-10-CM | POA: Diagnosis not present

## 2021-09-16 DIAGNOSIS — E782 Mixed hyperlipidemia: Secondary | ICD-10-CM | POA: Diagnosis not present

## 2021-09-16 DIAGNOSIS — G8929 Other chronic pain: Secondary | ICD-10-CM | POA: Diagnosis not present

## 2021-09-16 DIAGNOSIS — M2041 Other hammer toe(s) (acquired), right foot: Secondary | ICD-10-CM | POA: Diagnosis not present

## 2021-09-17 DIAGNOSIS — G8929 Other chronic pain: Secondary | ICD-10-CM | POA: Diagnosis not present

## 2021-09-17 DIAGNOSIS — S72001D Fracture of unspecified part of neck of right femur, subsequent encounter for closed fracture with routine healing: Secondary | ICD-10-CM | POA: Diagnosis not present

## 2021-09-17 DIAGNOSIS — M5136 Other intervertebral disc degeneration, lumbar region: Secondary | ICD-10-CM | POA: Diagnosis not present

## 2021-09-17 DIAGNOSIS — E782 Mixed hyperlipidemia: Secondary | ICD-10-CM | POA: Diagnosis not present

## 2021-09-17 DIAGNOSIS — M2041 Other hammer toe(s) (acquired), right foot: Secondary | ICD-10-CM | POA: Diagnosis not present

## 2021-09-17 DIAGNOSIS — J449 Chronic obstructive pulmonary disease, unspecified: Secondary | ICD-10-CM | POA: Diagnosis not present

## 2021-09-17 DIAGNOSIS — M25561 Pain in right knee: Secondary | ICD-10-CM | POA: Diagnosis not present

## 2021-09-17 DIAGNOSIS — M4316 Spondylolisthesis, lumbar region: Secondary | ICD-10-CM | POA: Diagnosis not present

## 2021-09-17 DIAGNOSIS — E119 Type 2 diabetes mellitus without complications: Secondary | ICD-10-CM | POA: Diagnosis not present

## 2021-10-02 ENCOUNTER — Other Ambulatory Visit: Payer: Self-pay

## 2021-10-02 DIAGNOSIS — S72041A Displaced fracture of base of neck of right femur, initial encounter for closed fracture: Secondary | ICD-10-CM | POA: Diagnosis not present

## 2021-10-02 DIAGNOSIS — I10 Essential (primary) hypertension: Secondary | ICD-10-CM | POA: Diagnosis not present

## 2021-10-02 DIAGNOSIS — J449 Chronic obstructive pulmonary disease, unspecified: Secondary | ICD-10-CM | POA: Diagnosis not present

## 2021-10-02 DIAGNOSIS — S72001A Fracture of unspecified part of neck of right femur, initial encounter for closed fracture: Secondary | ICD-10-CM | POA: Diagnosis not present

## 2021-10-02 DIAGNOSIS — S161XXA Strain of muscle, fascia and tendon at neck level, initial encounter: Secondary | ICD-10-CM | POA: Diagnosis not present

## 2021-10-02 DIAGNOSIS — M25571 Pain in right ankle and joints of right foot: Secondary | ICD-10-CM | POA: Diagnosis not present

## 2021-10-02 DIAGNOSIS — R410 Disorientation, unspecified: Secondary | ICD-10-CM | POA: Diagnosis not present

## 2021-10-03 ENCOUNTER — Other Ambulatory Visit: Payer: Self-pay

## 2021-10-06 ENCOUNTER — Other Ambulatory Visit: Payer: Self-pay

## 2021-10-06 MED ORDER — LOSARTAN POTASSIUM 50 MG PO TABS
50.0000 mg | ORAL_TABLET | Freq: Every day | ORAL | 3 refills | Status: AC
  Filled 2021-10-06: qty 90, 90d supply, fill #0

## 2021-10-13 ENCOUNTER — Other Ambulatory Visit: Payer: Self-pay

## 2021-10-13 DIAGNOSIS — R32 Unspecified urinary incontinence: Secondary | ICD-10-CM | POA: Diagnosis not present

## 2021-10-13 DIAGNOSIS — G250 Essential tremor: Secondary | ICD-10-CM | POA: Diagnosis not present

## 2021-10-13 DIAGNOSIS — G3184 Mild cognitive impairment, so stated: Secondary | ICD-10-CM | POA: Diagnosis not present

## 2021-10-13 DIAGNOSIS — I10 Essential (primary) hypertension: Secondary | ICD-10-CM | POA: Diagnosis not present

## 2021-10-13 MED ORDER — DONEPEZIL HCL 5 MG PO TABS
5.0000 mg | ORAL_TABLET | Freq: Every morning | ORAL | 5 refills | Status: AC
  Filled 2021-10-13: qty 30, 30d supply, fill #0
  Filled 2021-11-04: qty 30, 30d supply, fill #1

## 2021-10-14 ENCOUNTER — Other Ambulatory Visit: Payer: Self-pay

## 2021-10-15 ENCOUNTER — Other Ambulatory Visit: Payer: Self-pay

## 2021-10-16 DIAGNOSIS — I700 Atherosclerosis of aorta: Secondary | ICD-10-CM | POA: Diagnosis not present

## 2021-10-16 DIAGNOSIS — I10 Essential (primary) hypertension: Secondary | ICD-10-CM | POA: Diagnosis not present

## 2021-10-16 DIAGNOSIS — I2510 Atherosclerotic heart disease of native coronary artery without angina pectoris: Secondary | ICD-10-CM | POA: Diagnosis not present

## 2021-10-16 DIAGNOSIS — E782 Mixed hyperlipidemia: Secondary | ICD-10-CM | POA: Diagnosis not present

## 2021-10-22 ENCOUNTER — Other Ambulatory Visit: Payer: Self-pay

## 2021-10-24 ENCOUNTER — Encounter: Payer: Self-pay | Admitting: Emergency Medicine

## 2021-10-24 ENCOUNTER — Other Ambulatory Visit: Payer: Self-pay

## 2021-10-24 NOTE — ED Triage Notes (Signed)
Patient thinks that she is having bleeding from her rectum that started yesterday.  Patient states that she does have a hemorrhoid.  Patient reports bright red blood.  Patient reports only a small amount of blood when she wipes.  Patient reports a burning pain in her rectum.

## 2021-10-28 ENCOUNTER — Other Ambulatory Visit: Payer: Self-pay

## 2021-10-28 DIAGNOSIS — I10 Essential (primary) hypertension: Secondary | ICD-10-CM | POA: Diagnosis not present

## 2021-10-28 DIAGNOSIS — E118 Type 2 diabetes mellitus with unspecified complications: Secondary | ICD-10-CM | POA: Diagnosis not present

## 2021-10-28 MED ORDER — LOSARTAN POTASSIUM 100 MG PO TABS
ORAL_TABLET | ORAL | 11 refills | Status: AC
  Filled 2021-10-28: qty 30, 30d supply, fill #0

## 2021-11-02 ENCOUNTER — Other Ambulatory Visit: Payer: Self-pay

## 2021-11-02 ENCOUNTER — Ambulatory Visit
Admission: EM | Admit: 2021-11-02 | Discharge: 2021-11-02 | Disposition: A | Discharge status: Home / Self Care | Payer: 59 | Attending: Physician Assistant | Admitting: Physician Assistant

## 2021-11-02 DIAGNOSIS — R5381 Other malaise: Secondary | ICD-10-CM | POA: Diagnosis not present

## 2021-11-02 DIAGNOSIS — R42 Dizziness and giddiness: Secondary | ICD-10-CM | POA: Diagnosis not present

## 2021-11-02 DIAGNOSIS — R531 Weakness: Secondary | ICD-10-CM | POA: Diagnosis not present

## 2021-11-02 DIAGNOSIS — R5383 Other fatigue: Secondary | ICD-10-CM | POA: Diagnosis not present

## 2021-11-02 LAB — URINALYSIS, COMPLETE (UACMP) WITH MICROSCOPIC
Bacteria, UA: NONE SEEN
Bilirubin Urine: NEGATIVE
Glucose, UA: NEGATIVE mg/dL
Hgb urine dipstick: NEGATIVE
Ketones, ur: NEGATIVE mg/dL
Leukocytes,Ua: NEGATIVE
Nitrite: NEGATIVE
Protein, ur: NEGATIVE mg/dL
Specific Gravity, Urine: 1.01 (ref 1.005–1.030)
pH: 6 (ref 5.0–8.0)

## 2021-11-02 LAB — COMPREHENSIVE METABOLIC PANEL
ALT: 18 U/L (ref 0–44)
AST: 27 U/L (ref 15–41)
Albumin: 4.1 g/dL (ref 3.5–5.0)
Alkaline Phosphatase: 64 U/L (ref 38–126)
Anion gap: 7 (ref 5–15)
BUN: 15 mg/dL (ref 8–23)
CO2: 24 mmol/L (ref 22–32)
Calcium: 9.6 mg/dL (ref 8.9–10.3)
Chloride: 105 mmol/L (ref 98–111)
Creatinine, Ser: 0.89 mg/dL (ref 0.44–1.00)
GFR, Estimated: >60 mL/min (ref >60)
Glucose, Bld: 157 mg/dL — ABNORMAL HIGH (ref 70–99)
Potassium: 4.1 mmol/L (ref 3.5–5.1)
Sodium: 136 mmol/L (ref 135–145)
Total Bilirubin: 0.8 mg/dL (ref 0.3–1.2)
Total Protein: 7.4 g/dL (ref 6.5–8.1)

## 2021-11-02 LAB — CBC WITH DIFFERENTIAL/PLATELET
Abs Immature Granulocytes: 0.01 10*3/uL (ref 0.00–0.07)
Basophils Absolute: 0.1 10*3/uL (ref 0.0–0.1)
Basophils Relative: 1 %
Eosinophils Absolute: 0.2 10*3/uL (ref 0.0–0.5)
Eosinophils Relative: 3 %
HCT: 36.4 % (ref 36.0–46.0)
Hemoglobin: 11.7 g/dL — ABNORMAL LOW (ref 12.0–15.0)
Immature Granulocytes: 0 %
Lymphocytes Relative: 46 %
Lymphs Abs: 2.5 10*3/uL (ref 0.7–4.0)
MCH: 29.9 pg (ref 26.0–34.0)
MCHC: 32.1 g/dL (ref 30.0–36.0)
MCV: 93.1 fL (ref 80.0–100.0)
Monocytes Absolute: 0.5 10*3/uL (ref 0.1–1.0)
Monocytes Relative: 8 %
Neutro Abs: 2.3 10*3/uL (ref 1.7–7.7)
Neutrophils Relative %: 42 %
Platelets: 194 10*3/uL (ref 150–400)
RBC: 3.91 MIL/uL (ref 3.87–5.11)
RDW: 14.6 % (ref 11.5–15.5)
WBC: 5.4 10*3/uL (ref 4.0–10.5)
nRBC: 0 % (ref 0.0–0.2)

## 2021-11-02 LAB — GLUCOSE, CAPILLARY: Glucose-Capillary: 118 mg/dL — ABNORMAL HIGH (ref 70–99)

## 2021-11-02 NOTE — ED Provider Notes (Addendum)
[facility]    CSN: [id number] Arrival date & time: 11/02/21  1209      History   Chief Complaint Chief Complaint  Patient presents with   Dizziness   Weakness    HPI Breanna Petty is a 80 y.o. female.   Patient presents today with a several hour history of generalized weakness, fatigue, dizziness.  She describes dizziness as feeling unsteady and room spinning sensation.  She denies any focal weakness, headache, nausea, vomiting, vision changes, dysarthria.  She does report that several weeks ago her blood pressure medication was changed and she has had elevated blood pressure since that time.  She denies any more recent medication changes.  Denies any head injury.  Denies history of neurological condition including previous stroke or MS.  She does report that yesterday she was very active cleaning her house wonders if she could have overdone it.  She did eat last night but has not had anything to eat today.  Reports she ate a regular meal at [facility] which is typical for her.  Denies any recent illness or additional symptoms including fever, chest pain, shortness of breath, cough, congestion, body aches.  Denies any known sick contacts.  She denies any urinary symptoms including frequency, urgency, dysuria.  Reports that she has been feeling better while waiting to be seen here but continues to feel fatigue and generalized malaise.   Past Medical History:  Diagnosis Date   Acute diverticulitis 10/02/2016   Anginal pain ([place])    Anxiety    Aortic atherosclerosis ([place])    Arthritis    Atherosclerotic heart disease    Depression    Diabetes mellitus without complication ([place])    Diverticulosis of colon    Dysrhythmia    GERD (gastroesophageal reflux disease)    Hiatal hernia    History of heart artery stent    History of shingles    HOH (hard of hearing)    Hyperlipidemia    Hypertension    Myocardial infarction [facility])    Sarcoidosis    Vitamin D deficiency      Patient Active Problem List   Diagnosis Date Noted   Closed right hip fracture ([place]) 05/20/2021   CAD (coronary artery disease) 05/20/2021   HTN (hypertension) 05/20/2021   Type II diabetes mellitus with renal manifestations ([place]) 05/20/2021   CKD (chronic kidney disease), stage IIIa 05/20/2021   Fall at home, initial encounter 05/20/2021   Status post exploratory laparotomy 10/21/2016   Myocardial infarction ([place]) 10/20/2016   SBO (small bowel obstruction) (HCC)    Aortic atherosclerosis ([place]) 10/02/2016   Diverticulosis of large intestine without hemorrhage 10/02/2016   Hammer toe of right foot 10/16/2015   Nail deformity 10/16/2015   Chronic obstructive pulmonary disease ([place]) 09/04/2015   History of MI (myocardial infarction) 09/04/2015   Spondylolisthesis at L4-L5 level 07/11/2015   DDD (degenerative disc disease), lumbar 07/11/2015   History of sarcoidosis 06/04/2015   Urge incontinence of urine 06/04/2015   Hearing loss of both ears 05/07/2015   Anxiety and depression 05/04/2015   Arteriosclerosis of coronary artery 05/04/2015   Type 2 diabetes mellitus with microalbuminuria, without long-term current use of insulin ([place]) 05/04/2015   Dyslipidemia 05/04/2015   Hypertension, benign 05/04/2015   Gastro-esophageal reflux disease without esophagitis 05/04/2015   Hiatal hernia 05/04/2015   HZV (herpes zoster virus) post herpetic neuralgia 05/04/2015   Vitamin D deficiency 05/04/2015   S/P coronary artery stent placement 05/04/2015    Past Surgical  History:  Procedure Laterality Date   ABDOMINAL HYSTERECTOMY  1979   APPENDECTOMY     CARDIAC SURGERY     CATARACT EXTRACTION W/PHACO Right 10/28/2017   Procedure: CATARACT EXTRACTION PHACO AND INTRAOCULAR LENS PLACEMENT ([place]);  Surgeon: Eulogio Bear, MD;  Location: [facility] ORS;  Service: Ophthalmology;  Laterality: Right;  Lot # [id number] H [place]: 00:53.9 AP%: 10.6 CDE: 5.71   CORONARY ANGIOPLASTY     STENT   HIP  ARTHROPLASTY Right 05/21/2021   Procedure: ARTHROPLASTY BIPOLAR HIP (HEMIARTHROPLASTY);  Surgeon: Renee Harder, MD;  Location: [facility] ORS;  Service: Orthopedics;  Laterality: Right;   LAPAROTOMY N/A 10/04/2016   Procedure: EXPLORATORY LAPAROTOMY;  Surgeon: Dia Crawford III, MD;  Location: [facility] ORS;  Service: General;  Laterality: N/A;    OB History   No obstetric history on file.      Home Medications    Prior to Admission medications   Medication Sig Start Date End Date Taking? Authorizing Provider  albuterol (VENTOLIN HFA) 108 (90 Base) MCG/ACT inhaler Inhale 2 puffs into the lungs every 4 (four) hours as needed for wheezing or shortness of breath. 05/29/21  Yes Hosie Poisson, MD  amLODipine (NORVASC) 2.5 MG tablet Take 1 tablet (2.5 mg total) by mouth daily. 05/29/21  Yes Hosie Poisson, MD  aspirin EC 81 MG tablet Take 1 tablet (81 mg total) by mouth daily. 12/29/16  Yes Juline Patch, MD  dextromethorphan-guaiFENesin [facility] DM) 30-600 MG 12hr tablet Take 1 tablet by mouth 2 (two) times daily as needed for cough. 05/29/21  Yes Hosie Poisson, MD  donepezil (ARICEPT) 5 MG tablet Take 1 tablet (5 mg total) by mouth every morning. 10/13/21  Yes   fluticasone (FLONASE) 50 MCG/ACT nasal spray Place 2 sprays into both nostrils daily. 08/30/21  Yes Laurene Footman B, PA-C  glucose blood (FREESTYLE LITE) test strip USE AS INSTRUCTED 11/13/19  Yes Hubbard Hartshorn, FNP  ipratropium (ATROVENT) 0.06 % nasal spray Place 2 sprays into both nostrils 4 (four) times daily as needed for rhinitis. 07/06/20  Yes Cook, Jayce G, DO  Lancets (FREESTYLE) lancets TEST 2 TIMES A DAY 12/23/20 12/23/21 Yes Whitaker, Corene Cornea Hestle, PA-C  latanoprost (XALATAN) 0.005 % ophthalmic solution PLACE 1 DROP INTO BOTH EYES AT BEDTIME 08/28/21  Yes   losartan (COZAAR) 100 MG tablet Take 1 tablet (100 mg total) by mouth once daily 10/28/21  Yes   losartan (COZAAR) 50 MG tablet Take 1 tablet (50 mg total) by mouth daily. 12/23/18  Yes  Juline Patch, MD  losartan (COZAAR) 50 MG tablet Take 1 tablet (50 mg total) by mouth once daily 10/03/21  Yes   Multiple Vitamin (MULTIVITAMIN) capsule Take 1 capsule by mouth daily.   Yes [provider]  NITROSTAT 0.4 MG SL tablet DISSOLVE ONE TABLET UNDER TONGUE AS NEED FOR CHEST PAIN AS DIRECTED 11/28/15  Yes Sowles, Drue Stager, MD  oxybutynin (DITROPAN-XL) 5 MG 24 hr tablet Take 1 tablet (5 mg total) by mouth once daily 08/06/21  Yes   pantoprazole (PROTONIX) 40 MG tablet Take 1 tablet (40 mg total) by mouth 2 (two) times daily before a meal. 06/30/21  Yes   polyethylene glycol (MIRALAX / GLYCOLAX) 17 g packet Take 17 g by mouth daily as needed. 05/29/21  Yes Hosie Poisson, MD  pravastatin (PRAVACHOL) 80 MG tablet Take 1 tablet (80 mg total) by mouth nightly 03/20/21  Yes   pravastatin (PRAVACHOL) 80 MG tablet Take 1 tablet (80 mg total) by mouth at bedtime  06/30/21  Yes   senna-docusate (SENOKOT-S) 8.6-50 MG tablet Take 1 tablet by mouth at bedtime as needed for mild constipation. 05/29/21  Yes Hosie Poisson, MD  sitaGLIPtin (JANUVIA) 100 MG tablet TAKE 1 TABLET (100 MG TOTAL) BY MOUTH ONCE DAILY 03/19/21 03/19/22 Yes   enoxaparin (LOVENOX) 40 MG/0.4ML injection Inject 0.4 mLs (40 mg total) into the skin daily for 7 days. 05/29/21 06/05/21  Hosie Poisson, MD  loratadine (CLARITIN) 10 MG tablet Take 1 tablet (10 mg total) by mouth daily for 15 days. 08/30/21 10/24/21  Laurene Footman B, PA-C  mirabegron ER (MYRBETRIQ) 50 MG TB24 tablet Take 1 tablet (50 mg total) by mouth daily. 04/07/21   Bjorn Loser, MD  pantoprazole (PROTONIX) 40 MG tablet TAKE 1 TABLET BY MOUTH 2 TIMES DAILY BEFORE MEALS 10/31/20 10/31/21  Whitaker, Rolanda Jay, PA-C    Family History Family History  Problem Relation Age of Onset   Diabetes Daughter    Hypertension Mother    Other Father        MVA   Diabetes Son    Breast cancer Neg Hx     Social History Social History   Tobacco Use   Smoking status: Former     Packs/day: 2.00    Years: 10.00    Pack years: 20.00    Types: Cigarettes    Quit date: 11/16/1988    Years since quitting: 32.9   Smokeless tobacco: Never  Vaping Use   Vaping Use: Never used  Substance Use Topics   Alcohol use: No    Alcohol/week: 0.0 standard drinks   Drug use: No     Allergies   Ace inhibitors, Codeine, Codeine sulfate, Ezetimibe, and Other   Review of Systems Review of Systems  Constitutional:  Positive for activity change and fatigue. Negative for appetite change and fever.  HENT:  Negative for congestion, sinus pressure, sneezing and sore throat.   Respiratory:  Negative for cough and shortness of breath.   Cardiovascular:  Negative for chest pain.  Gastrointestinal:  Negative for abdominal pain, diarrhea, nausea and vomiting.  Genitourinary:  Negative for dysuria, frequency and urgency.  Musculoskeletal:  Negative for arthralgias and myalgias.  Neurological:  Positive for dizziness and weakness (Generalized). Negative for syncope, facial asymmetry, speech difficulty, light-headedness, numbness and headaches.    Physical Exam Triage Vital Signs ED Triage Vitals  Enc Vitals Group     BP 11/02/21 1220 (!) 158/71     Pulse Rate 11/02/21 1220 70     Resp 11/02/21 1220 18     Temp 11/02/21 1220 98.1 F (36.7 C)     Temp Source 11/02/21 1220 Oral     SpO2 11/02/21 1220 100 %     Weight 11/02/21 1222 162 lb 0.6 oz (73.5 kg)     Height --      Head Circumference --      Peak Flow --      Pain Score 11/02/21 1222 0     Pain Loc --      Pain Edu? --      Excl. in [place]? --    Orthostatic VS for the past 24 hrs:  BP- Lying Pulse- Lying BP- Sitting Pulse- Sitting  11/02/21 1226 166/77 70 158/77 71    Updated Vital Signs BP (!) 158/71 (BP Location: Left Arm)    Pulse 70    Temp 98.1 F (36.7 C) (Oral)    Resp 18    Wt 162 lb 0.6 oz (73.5  kg)    SpO2 100%    BMI 22.60 kg/m   Visual Acuity Right Eye Distance:   Left Eye Distance:   Bilateral  Distance:    Right Eye Near:   Left Eye Near:    Bilateral Near:     Physical Exam Vitals reviewed.  Constitutional:      General: She is awake. She is not in acute distress.    Appearance: Normal appearance. She is well-developed. She is not ill-appearing.     Comments: Very pleasant female appears stated age in no acute distress sitting comfortably in exam room table  HENT:     Head: Normocephalic and atraumatic. No raccoon eyes, Battle's sign or contusion.     Right Ear: Tympanic membrane, ear canal and external ear normal. No hemotympanum.     Left Ear: Tympanic membrane, ear canal and external ear normal. No hemotympanum.     Mouth/Throat:     Tongue: Tongue does not deviate from midline.     Pharynx: Uvula midline. No oropharyngeal exudate or posterior oropharyngeal erythema.  Eyes:     Extraocular Movements: Extraocular movements intact.     Pupils: Pupils are equal, round, and reactive to light.     Comments: Arcus senilis bilaterally  Cardiovascular:     Rate and Rhythm: Normal rate and regular rhythm.     Heart sounds: Normal heart sounds, S1 normal and S2 normal. No murmur heard. Pulmonary:     Effort: Pulmonary effort is normal.     Breath sounds: Normal breath sounds. No wheezing, rhonchi or rales.     Comments: Clear to auscultation bilaterally Abdominal:     General: Bowel sounds are normal.     Palpations: Abdomen is soft.     Tenderness: There is no abdominal tenderness.  Musculoskeletal:     Cervical back: No spinous process tenderness or muscular tenderness.     Right lower leg: No edema.     Left lower leg: No edema.     Comments: Strength 5/5 bilateral upper and lower extremities  Lymphadenopathy:     Head:     Right side of head: No submental, submandibular or tonsillar adenopathy.     Left side of head: No submental, submandibular or tonsillar adenopathy.  Neurological:     General: No focal deficit present.     Mental Status: She is alert and  oriented to person, place, and time.     Cranial Nerves: Cranial nerves 2-12 are intact.     Motor: Motor function is intact.     Coordination: Coordination is intact.     Gait: Gait is intact.  Psychiatric:        Behavior: Behavior is cooperative.     UC Treatments / Results  Labs (all labs ordered are listed, but only abnormal results are displayed) Labs Reviewed  GLUCOSE, CAPILLARY - Abnormal; Notable for the following components:      Result Value   Glucose-Capillary 118 (*)    All other components within normal limits  URINALYSIS, COMPLETE (UACMP) WITH MICROSCOPIC - Abnormal; Notable for the following components:   Color, Urine STRAW (*)    All other components within normal limits  CBC WITH DIFFERENTIAL/PLATELET - Abnormal; Notable for the following components:   Hemoglobin 11.7 (*)    All other components within normal limits  COMPREHENSIVE METABOLIC PANEL - Abnormal; Notable for the following components:   Glucose, Bld 157 (*)    All other components within normal limits  CBG MONITORING,  ED    EKG   Radiology No results found.  Procedures Procedures (including critical care time)  Medications Ordered in UC Medications - No data to display  Initial Impression / Assessment and Plan / UC Course  I have reviewed the triage vital signs and the nursing notes.  Pertinent labs & imaging results that were available during my care of the patient were reviewed by me and considered in my medical decision making (see chart for details).     Vital signs and physical exam reassuring today.  Patient is afebrile and nontoxic-appearing.  She is able to ambulate without difficulty unassisted throughout clinic.  EKG obtained showed normal sinus rhythm with ventricular rate of 70 bpm with P wave inversion in lead III and good R wave progression compared to 05/20/2021 tracing; no ischemic changes noted.  Glucose was appropriate.  CBC showed mild anemia but this is slightly improved  compared to last hemoglobin level obtained with PCP September 2022 which was 11.5 (11.7 today).  Orthostatic vital signs elevated blood pressure with changing positions and no postural hypotension or significant tachycardia.  Viral testing was deferred as patient has no URI symptoms.  CMP was appropriate with no electrolyte abnormality.  UA showed no evidence of infection or dehydration.  Patient reports she was feeling better at the end of visit.  Recommended that she drink plenty of fluid and eat small frequent meals.  Will defer meclizine due to concern for anticholinergic side effects in the elderly.  Recommended she follow-up with her PCP first thing tomorrow for further evaluation and management.  Discussed that if she has any worsening symptoms including chest pain, shortness of breath, passing out, recurrent lightheadedness, weakness, difficulty speaking, nausea/vomiting she needs to go to the emergency room.  Strict return precautions given to which she expressed understanding.  Final Clinical Impressions(s) / UC Diagnoses   Final diagnoses:  Generalized weakness  Malaise and fatigue  Dizziness     Discharge Instructions      Your urine, blood work, EKG was all reasonable.  You are not anemic.  It does not look you have a urinary tract infection.  You do not appear to be dehydrated.  I would recommend that you push fluids.  Eat small frequent meals throughout the day.  Rest and keep your feet elevated.  Take precautions not to fall.  I recommend you follow-up with your primary care doctor tomorrow for further evaluation and management.  If you have any chest pain, shortness of breath, passing out, recurrent lightheadedness, nausea, vomiting you need to go to the emergency room immediately.     ED Prescriptions   None    PDMP not reviewed this encounter.   Terrilee Croak, PA-C 11/02/21 1349    Hilary Pundt, Derry Skill, PA-C 11/02/21 1352

## 2021-11-02 NOTE — Discharge Instructions (Signed)
Your urine, blood work, EKG was all reasonable.  You are not anemic.  It does not look you have a urinary tract infection.  You do not appear to be dehydrated.  I would recommend that you push fluids.  Eat small frequent meals throughout the day.  Rest and keep your feet elevated.  Take precautions not to fall.  I recommend you follow-up with your primary care doctor tomorrow for further evaluation and management.  If you have any chest pain, shortness of breath, passing out, recurrent lightheadedness, nausea, vomiting you need to go to the emergency room immediately.

## 2021-11-02 NOTE — ED Triage Notes (Signed)
Patient is here for "dizziness" upon "standing". "Went to [facility] yesterday, got fries, hamburger, following that light headedness during night". Started with "weakness" and "dizziness". No nausea. Chest pain "a few times this morning". No sob. No acute other symptoms.

## 2021-11-04 ENCOUNTER — Other Ambulatory Visit: Payer: Self-pay

## 2021-11-04 DIAGNOSIS — R531 Weakness: Secondary | ICD-10-CM | POA: Diagnosis not present

## 2021-11-04 DIAGNOSIS — Z03818 Encounter for observation for suspected exposure to other biological agents ruled out: Secondary | ICD-10-CM | POA: Diagnosis not present

## 2021-11-04 DIAGNOSIS — R5383 Other fatigue: Secondary | ICD-10-CM | POA: Diagnosis not present

## 2021-11-04 DIAGNOSIS — I10 Essential (primary) hypertension: Secondary | ICD-10-CM | POA: Diagnosis not present

## 2021-11-04 MED ORDER — DONEPEZIL HCL 5 MG PO TABS
ORAL_TABLET | ORAL | 5 refills | Status: AC
  Filled 2021-11-04: qty 30, 30d supply, fill #0

## 2021-11-05 ENCOUNTER — Other Ambulatory Visit: Payer: Self-pay

## 2021-11-06 ENCOUNTER — Other Ambulatory Visit: Payer: Self-pay

## 2021-11-11 ENCOUNTER — Other Ambulatory Visit: Payer: Self-pay

## 2021-11-17 ENCOUNTER — Other Ambulatory Visit: Payer: Self-pay

## 2022-02-19 ENCOUNTER — Other Ambulatory Visit: Payer: Self-pay

## 2022-02-23 ENCOUNTER — Other Ambulatory Visit: Payer: Self-pay

## 2022-02-27 ENCOUNTER — Ambulatory Visit
Admission: EM | Admit: 2022-02-27 | Discharge: 2022-02-27 | Disposition: A | Discharge status: Home / Self Care | Payer: Medicare Other | Attending: Physician Assistant | Admitting: Physician Assistant

## 2022-02-27 DIAGNOSIS — E1122 Type 2 diabetes mellitus with diabetic chronic kidney disease: Secondary | ICD-10-CM | POA: Diagnosis not present

## 2022-02-27 DIAGNOSIS — J069 Acute upper respiratory infection, unspecified: Secondary | ICD-10-CM | POA: Diagnosis not present

## 2022-02-27 DIAGNOSIS — J449 Chronic obstructive pulmonary disease, unspecified: Secondary | ICD-10-CM | POA: Diagnosis not present

## 2022-02-27 DIAGNOSIS — R0981 Nasal congestion: Secondary | ICD-10-CM

## 2022-02-27 DIAGNOSIS — R051 Acute cough: Secondary | ICD-10-CM | POA: Insufficient documentation

## 2022-02-27 DIAGNOSIS — N1831 Chronic kidney disease, stage 3a: Secondary | ICD-10-CM | POA: Diagnosis not present

## 2022-02-27 DIAGNOSIS — I129 Hypertensive chronic kidney disease with stage 1 through stage 4 chronic kidney disease, or unspecified chronic kidney disease: Secondary | ICD-10-CM | POA: Insufficient documentation

## 2022-02-27 DIAGNOSIS — Z955 Presence of coronary angioplasty implant and graft: Secondary | ICD-10-CM | POA: Insufficient documentation

## 2022-02-27 DIAGNOSIS — E785 Hyperlipidemia, unspecified: Secondary | ICD-10-CM | POA: Insufficient documentation

## 2022-02-27 DIAGNOSIS — I252 Old myocardial infarction: Secondary | ICD-10-CM | POA: Diagnosis not present

## 2022-02-27 DIAGNOSIS — Z20822 Contact with and (suspected) exposure to covid-19: Secondary | ICD-10-CM | POA: Insufficient documentation

## 2022-02-27 DIAGNOSIS — D869 Sarcoidosis, unspecified: Secondary | ICD-10-CM | POA: Diagnosis not present

## 2022-02-27 LAB — RESP PANEL BY RT-PCR (FLU A&B, COVID) ARPGX2
Influenza A by PCR: NEGATIVE
Influenza B by PCR: NEGATIVE
SARS Coronavirus 2 by RT PCR: NEGATIVE

## 2022-02-27 MED ORDER — LORATADINE 10 MG PO TABS: 10.0000 mg | ORAL_TABLET | Freq: Every day | ORAL | 0 refills | Status: DC

## 2022-02-27 MED ORDER — BENZONATATE 200 MG PO CAPS: 200.0000 mg | ORAL_CAPSULE | Freq: Three times a day (TID) | ORAL | 0 refills | Status: DC | PRN

## 2022-02-27 MED ORDER — IPRATROPIUM BROMIDE 0.06 % NA SOLN: 2.0000 | Freq: Four times a day (QID) | NASAL | 0 refills | Status: DC

## 2022-02-27 NOTE — ED Triage Notes (Signed)
Pt c/o possible sinus infection. Pt has sinus congestion, cough x2weeks.  ? ? ?

## 2022-02-27 NOTE — Discharge Instructions (Addendum)
-  We will call you with the results of the COVID/flu test. ?- If you have COVID I can send an antiviral medicine for you. ?- Your symptoms are consistent with a virus versus allergies.  I have sent a cough medicine and a different nasal spray for you.  Increase rest and fluids. ?- Follow-up if you are not feeling better in the next week or if you develop a fever or worsening symptoms. ?-Your blood pressure is high.  Continue to keep a log and follow-up with your primary care provider especially if it is over 140/90 consistently. ?

## 2022-02-27 NOTE — ED Provider Notes (Signed)
?[place] ? ? ? ?CSN: [id number] ?Arrival date & time: 02/27/22  2641 ? ? ?  ? ?History   ?Chief Complaint ?Chief Complaint  ?Patient presents with  ? Cough  ? Nasal Congestion  ? ? ?HPI ?Breanna Petty is a 81 y.o. female presenting for 4-day history of nasal congestion, postnasal drainage and cough that is occasionally productive.  Patient also reports some headaches.  She says symptoms started a couple days after she was out in the cold weather over the weekend.  Patient denies any associated fever, fatigue, aches, sinus pain, chest pain, shortness of breath, vomiting or diarrhea.  No sick contacts.  No significant concern for COVID-19 but she would like to be tested.  Patient says she has been using Flonase but no other medications.  She does not think the Flonase has helped.  Patient has history of hypertension.  States her BP is normally under control.  Blood pressure in office is 173/72.  Patient has no other concerns.  Of note, patient has history of COPD but does not use any inhalers and is considered to be stable.  Other medical history significant for previous MI with stent placement, hypertension, hyperlipidemia, difficulty hearing, diabetes, anxiety and depression, diverticulitis, sarcoidosis. ? ?HPI ? ?Past Medical History:  ?Diagnosis Date  ? Acute diverticulitis 10/02/2016  ? Anginal pain ([place])   ? Anxiety   ? Aortic atherosclerosis ([place])   ? Arthritis   ? Atherosclerotic heart disease   ? Depression   ? Diabetes mellitus without complication ([place])   ? Diverticulosis of colon   ? Dysrhythmia   ? GERD (gastroesophageal reflux disease)   ? Hiatal hernia   ? History of heart artery stent   ? History of shingles   ? HOH (hard of hearing)   ? Hyperlipidemia   ? Hypertension   ? Myocardial infarction [facility])   ? Sarcoidosis   ? Vitamin D deficiency   ? ? ?Patient Active Problem List  ? Diagnosis Date Noted  ? Closed right hip fracture ([place]) 05/20/2021  ? CAD (coronary artery disease) 05/20/2021   ? HTN (hypertension) 05/20/2021  ? Type II diabetes mellitus with renal manifestations ([place]) 05/20/2021  ? CKD (chronic kidney disease), stage IIIa 05/20/2021  ? Fall at home, initial encounter 05/20/2021  ? Status post exploratory laparotomy 10/21/2016  ? Myocardial infarction ([place]) 10/20/2016  ? SBO (small bowel obstruction) ([place])   ? Aortic atherosclerosis ([place]) 10/02/2016  ? Diverticulosis of large intestine without hemorrhage 10/02/2016  ? Hammer toe of right foot 10/16/2015  ? Nail deformity 10/16/2015  ? Chronic obstructive pulmonary disease ([place]) 09/04/2015  ? History of MI (myocardial infarction) 09/04/2015  ? Spondylolisthesis at L4-L5 level 07/11/2015  ? DDD (degenerative disc disease), lumbar 07/11/2015  ? History of sarcoidosis 06/04/2015  ? Urge incontinence of urine 06/04/2015  ? Hearing loss of both ears 05/07/2015  ? Anxiety and depression 05/04/2015  ? Arteriosclerosis of coronary artery 05/04/2015  ? Type 2 diabetes mellitus with microalbuminuria, without long-term current use of insulin ([place]) 05/04/2015  ? Dyslipidemia 05/04/2015  ? Hypertension, benign 05/04/2015  ? Gastro-esophageal reflux disease without esophagitis 05/04/2015  ? Hiatal hernia 05/04/2015  ? HZV (herpes zoster virus) post herpetic neuralgia 05/04/2015  ? Vitamin D deficiency 05/04/2015  ? S/P coronary artery stent placement 05/04/2015  ? ? ?Past Surgical History:  ?Procedure Laterality Date  ? ABDOMINAL HYSTERECTOMY  1979  ? APPENDECTOMY    ? CARDIAC SURGERY    ? CATARACT  EXTRACTION W/PHACO Right 10/28/2017  ? Procedure: CATARACT EXTRACTION PHACO AND INTRAOCULAR LENS PLACEMENT (IOC);  Surgeon: Eulogio Bear, MD;  Location: [facility] ORS;  Service: Ophthalmology;  Laterality: Right;  Lot # [id number] ?[place]: 00:53.9 ?AP%: 10.6 ?CDE: 5.71  ? CORONARY ANGIOPLASTY    ? STENT  ? HIP ARTHROPLASTY Right 05/21/2021  ? Procedure: ARTHROPLASTY BIPOLAR HIP (HEMIARTHROPLASTY);  Surgeon: Renee Harder, MD;  Location: [facility] ORS;  Service:  Orthopedics;  Laterality: Right;  ? LAPAROTOMY N/A 10/04/2016  ? Procedure: EXPLORATORY LAPAROTOMY;  Surgeon: Dia Crawford III, MD;  Location: [facility] ORS;  Service: General;  Laterality: N/A;  ? ? ?OB History   ?No obstetric history on file. ?  ? ? ? ?Home Medications   ? ?Prior to Admission medications   ?Medication Sig Start Date End Date Taking? Authorizing Provider  ?albuterol (VENTOLIN HFA) 108 (90 Base) MCG/ACT inhaler Inhale 2 puffs into the lungs every 4 (four) hours as needed for wheezing or shortness of breath. 05/29/21  Yes Hosie Poisson, MD  ?amLODipine (NORVASC) 2.5 MG tablet Take 1 tablet (2.5 mg total) by mouth daily. 05/29/21  Yes Hosie Poisson, MD  ?aspirin EC 81 MG tablet Take 1 tablet (81 mg total) by mouth daily. 12/29/16  Yes Juline Patch, MD  ?benzonatate (TESSALON) 200 MG capsule Take 1 capsule (200 mg total) by mouth 3 (three) times daily as needed for cough. 02/27/22  Yes Laurene Footman B, PA-C  ?donepezil (ARICEPT) 5 MG tablet Take 1 tablet (5 mg total) by mouth every morning. 10/13/21  Yes   ?donepezil (ARICEPT) 5 MG tablet Take 1 tablet (5 mg total) by mouth every morning 11/04/21  Yes   ?glucose blood (FREESTYLE LITE) test strip USE AS INSTRUCTED 11/13/19  Yes Hubbard Hartshorn, FNP  ?ipratropium (ATROVENT) 0.06 % nasal spray Place 2 sprays into both nostrils 4 (four) times daily. 02/27/22  Yes Laurene Footman B, PA-C  ?latanoprost (XALATAN) 0.005 % ophthalmic solution PLACE 1 DROP INTO BOTH EYES AT BEDTIME 08/28/21  Yes   ?loratadine (CLARITIN) 10 MG tablet Take 1 tablet (10 mg total) by mouth daily for 15 days. 02/27/22 03/14/22 Yes Danton Clap, PA-C  ?losartan (COZAAR) 100 MG tablet Take 1 tablet (100 mg total) by mouth once daily 10/28/21  Yes   ?losartan (COZAAR) 50 MG tablet Take 1 tablet (50 mg total) by mouth daily. 12/23/18  Yes Juline Patch, MD  ?losartan (COZAAR) 50 MG tablet Take 1 tablet (50 mg total) by mouth once daily 10/03/21  Yes   ?mirabegron ER (MYRBETRIQ) 50 MG TB24 tablet  Take 1 tablet (50 mg total) by mouth daily. 04/07/21  Yes MacDiarmid, Nicki Reaper, MD  ?Multiple Vitamin (MULTIVITAMIN) capsule Take 1 capsule by mouth daily.   Yes [provider]  ?NITROSTAT 0.4 MG SL tablet DISSOLVE ONE TABLET UNDER TONGUE AS NEED FOR CHEST PAIN AS DIRECTED 11/28/15  Yes Sowles, Drue Stager, MD  ?oxybutynin (DITROPAN-XL) 5 MG 24 hr tablet Take 1 tablet (5 mg total) by mouth once daily 08/06/21  Yes   ?pantoprazole (PROTONIX) 40 MG tablet Take 1 tablet (40 mg total) by mouth 2 (two) times daily before a meal. 06/30/21  Yes   ?polyethylene glycol (MIRALAX / GLYCOLAX) 17 g packet Take 17 g by mouth daily as needed. 05/29/21  Yes Hosie Poisson, MD  ?pravastatin (PRAVACHOL) 80 MG tablet Take 1 tablet (80 mg total) by mouth nightly 03/20/21  Yes   ?pravastatin (PRAVACHOL) 80 MG tablet Take 1 tablet (80 mg total)  by mouth at bedtime 06/30/21  Yes   ?senna-docusate (SENOKOT-S) 8.6-50 MG tablet Take 1 tablet by mouth at bedtime as needed for mild constipation. 05/29/21  Yes Hosie Poisson, MD  ?sitaGLIPtin (JANUVIA) 100 MG tablet TAKE 1 TABLET (100 MG TOTAL) BY MOUTH ONCE DAILY 03/19/21 03/19/22 Yes   ?dextromethorphan-guaiFENesin (MUCINEX DM) 30-600 MG 12hr tablet Take 1 tablet by mouth 2 (two) times daily as needed for cough. 05/29/21   Hosie Poisson, MD  ?enoxaparin (LOVENOX) 40 MG/0.4ML injection Inject 0.4 mLs (40 mg total) into the skin daily for 7 days. 05/29/21 06/05/21  Hosie Poisson, MD  ?pantoprazole (PROTONIX) 40 MG tablet TAKE 1 TABLET BY MOUTH 2 TIMES DAILY BEFORE MEALS 10/31/20 10/31/21  Whitaker, Rolanda Jay, PA-C  ? ? ?Family History ?Family History  ?Problem Relation Age of Onset  ? Diabetes Daughter   ? Hypertension Mother   ? Other Father   ?     MVA  ? Diabetes Son   ? Breast cancer Neg Hx   ? ? ?Social History ?Social History  ? ?Tobacco Use  ? Smoking status: Former  ?  Packs/day: 2.00  ?  Years: 10.00  ?  Pack years: 20.00  ?  Types: Cigarettes  ?  Quit date: 11/16/1988  ?  Years since quitting: 33.3   ? Smokeless tobacco: Never  ?Vaping Use  ? Vaping Use: Never used  ?Substance Use Topics  ? Alcohol use: No  ?  Alcohol/week: 0.0 standard drinks  ? Drug use: No  ? ? ? ?Allergies   ?Ace inhibitors, Codeine

## 2022-05-11 ENCOUNTER — Other Ambulatory Visit: Payer: Self-pay | Admitting: Family Medicine

## 2022-05-11 DIAGNOSIS — Z1231 Encounter for screening mammogram for malignant neoplasm of breast: Secondary | ICD-10-CM

## 2022-05-15 ENCOUNTER — Other Ambulatory Visit: Payer: Self-pay

## 2022-05-27 ENCOUNTER — Ambulatory Visit
Admission: RE | Admit: 2022-05-27 | Discharge: 2022-05-27 | Disposition: A | Discharge status: Home / Self Care | Payer: Medicare HMO | Source: Ambulatory Visit | Attending: Family Medicine | Admitting: Family Medicine

## 2022-05-27 DIAGNOSIS — Z1231 Encounter for screening mammogram for malignant neoplasm of breast: Secondary | ICD-10-CM | POA: Insufficient documentation

## 2022-07-24 ENCOUNTER — Other Ambulatory Visit: Payer: Self-pay

## 2022-08-27 ENCOUNTER — Other Ambulatory Visit: Payer: Self-pay

## 2022-08-27 MED ORDER — LATANOPROST 0.005 % OP SOLN
1.0000 [drp] | Freq: Every day | OPHTHALMIC | 3 refills | Status: AC
  Filled 2022-08-27: qty 2.5, 16d supply, fill #0

## 2022-09-11 ENCOUNTER — Other Ambulatory Visit: Payer: Self-pay

## 2022-10-28 ENCOUNTER — Other Ambulatory Visit: Payer: Self-pay

## 2022-11-20 ENCOUNTER — Other Ambulatory Visit: Payer: Self-pay

## 2022-11-20 MED ORDER — LATANOPROST 0.005 % OP SOLN
1.0000 [drp] | Freq: Every day | OPHTHALMIC | 6 refills | Status: AC
  Filled 2022-11-20: qty 2.5, 50d supply, fill #0

## 2023-06-22 ENCOUNTER — Other Ambulatory Visit: Payer: Self-pay

## 2023-06-23 ENCOUNTER — Other Ambulatory Visit: Payer: Self-pay

## 2023-10-13 ENCOUNTER — Other Ambulatory Visit: Payer: Self-pay | Admitting: Internal Medicine

## 2023-10-13 DIAGNOSIS — Z13820 Encounter for screening for osteoporosis: Secondary | ICD-10-CM

## 2024-02-18 ENCOUNTER — Other Ambulatory Visit: Payer: Self-pay | Admitting: Internal Medicine

## 2024-02-18 ENCOUNTER — Encounter: Payer: Self-pay | Admitting: Internal Medicine

## 2024-02-18 DIAGNOSIS — Z13820 Encounter for screening for osteoporosis: Secondary | ICD-10-CM

## 2024-07-14 ENCOUNTER — Encounter: Payer: Self-pay | Admitting: Emergency Medicine

## 2024-07-14 ENCOUNTER — Ambulatory Visit
Admission: EM | Admit: 2024-07-14 | Discharge: 2024-07-14 | Disposition: A | Discharge status: Home / Self Care | Attending: Emergency Medicine | Admitting: Emergency Medicine

## 2024-07-14 DIAGNOSIS — I10 Essential (primary) hypertension: Secondary | ICD-10-CM | POA: Diagnosis present

## 2024-07-14 DIAGNOSIS — R0981 Nasal congestion: Secondary | ICD-10-CM | POA: Diagnosis present

## 2024-07-14 DIAGNOSIS — J069 Acute upper respiratory infection, unspecified: Secondary | ICD-10-CM | POA: Insufficient documentation

## 2024-07-14 LAB — SARS CORONAVIRUS 2 BY RT PCR: SARS Coronavirus 2 by RT PCR: NEGATIVE

## 2024-07-14 MED ORDER — LORATADINE 10 MG PO TABS: 10.0000 mg | ORAL_TABLET | Freq: Every day | ORAL | 0 refills | Status: AC | PRN

## 2024-07-14 MED ORDER — IPRATROPIUM BROMIDE 0.06 % NA SOLN: 2.0000 | Freq: Four times a day (QID) | NASAL | 0 refills | Status: AC

## 2024-07-14 NOTE — ED Provider Notes (Signed)
 [facility]    CSN: [id number] Arrival date & time: 07/14/24  1004      History   Chief Complaint Chief Complaint  Patient presents with   Nasal Congestion   Sinus Problem    HPI Breanna Petty is a 83 y.o. female presenting for 1 week history of nasal congestion and postnasal drainage. Patient denies any associated fever, fatigue, body aches, sinus pain, cough, chest pain, shortness of breath, vomiting or diarrhea.  No sick contacts.  No significant concern for COVID-19 but she would like to be tested.  Patient says she has been using Flonase  but no other medications.  She does not think the Flonase  has helped.  Patient has history of hypertension.  States her BP is normally under control.  Blood pressure in office is 158/78.  Patient has no other concerns.  Of note, patient has history of COPD but does not use any inhalers and is considered to be stable.  Other medical history significant for previous MI with stent placement, hypertension, hyperlipidemia, difficulty hearing, diabetes, anxiety and depression, diverticulitis, sarcoidosis.  HPI  Past Medical History:  Diagnosis Date   Acute diverticulitis 10/02/2016   Anginal pain (H[facility])    Anxiety    Aortic atherosclerosis (H[facility])    Arthritis    Atherosclerotic heart disease    Depression    Diabetes mellitus without complication (H[facility])    Diverticulosis of colon    Dysrhythmia    GERD (gastroesophageal reflux disease)    Hiatal hernia    History of heart artery stent    History of shingles    HOH (hard of hearing)    Hyperlipidemia    Hypertension    Myocardial infarction [facility])    Sarcoidosis    Vitamin D  deficiency     Patient Active Problem List   Diagnosis Date Noted   Closed right hip fracture (HCC) 05/20/2021   CAD (coronary artery disease) 05/20/2021   HTN (hypertension) 05/20/2021   Type II diabetes mellitus with renal manifestations (HCC) 05/20/2021   CKD (chronic kidney disease), stage IIIa  05/20/2021   Fall at home, initial encounter 05/20/2021   Status post exploratory laparotomy 10/21/2016   Myocardial infarction (HCC) 10/20/2016   SBO (small bowel obstruction) (HCC)    Aortic atherosclerosis (HCC) 10/02/2016   Diverticulosis of large intestine without hemorrhage 10/02/2016   Hammer toe of right foot 10/16/2015   Nail deformity 10/16/2015   Chronic obstructive pulmonary disease (HCC) 09/04/2015   History of MI (myocardial infarction) 09/04/2015   Spondylolisthesis at L4-L5 level 07/11/2015   DDD (degenerative disc disease), lumbar 07/11/2015   History of sarcoidosis 06/04/2015   Urge incontinence of urine 06/04/2015   Hearing loss of both ears 05/07/2015   Anxiety and depression 05/04/2015   Arteriosclerosis of coronary artery 05/04/2015   Type 2 diabetes mellitus with microalbuminuria, without long-term current use of insulin  (HCC) 05/04/2015   Dyslipidemia 05/04/2015   Hypertension, benign 05/04/2015   Gastro-esophageal reflux disease without esophagitis 05/04/2015   Hiatal hernia 05/04/2015   HZV (herpes zoster virus) post herpetic neuralgia 05/04/2015   Vitamin D  deficiency 05/04/2015   S/P coronary artery stent placement 05/04/2015    Past Surgical History:  Procedure Laterality Date   ABDOMINAL HYSTERECTOMY  1979   APPENDECTOMY     CARDIAC SURGERY     CATARACT EXTRACTION W/PHACO Right 10/28/2017   Procedure: CATARACT EXTRACTION PHACO AND INTRAOCULAR LENS PLACEMENT (IOC);  Surgeon: Myrna Adine Anes, MD;  Location: [facility] ORS;  Service:  Ophthalmology;  Laterality: Right;  Lot # [id number] H US : 00:53.9 AP%: 10.6 CDE: 5.71   CORONARY ANGIOPLASTY     STENT   HIP ARTHROPLASTY Right 05/21/2021   Procedure: ARTHROPLASTY BIPOLAR HIP (HEMIARTHROPLASTY);  Surgeon: Rollene Cough, MD;  Location: [facility] ORS;  Service: Orthopedics;  Laterality: Right;   LAPAROTOMY N/A 10/04/2016   Procedure: EXPLORATORY LAPAROTOMY;  Surgeon: Elgin Laurence III, MD;  Location: [facility] ORS;   Service: General;  Laterality: N/A;    OB History   No obstetric history on file.      Home Medications    Prior to Admission medications   Medication Sig Start Date End Date Taking? Authorizing Provider  ipratropium (ATROVENT ) 0.06 % nasal spray Place 2 sprays into both nostrils 4 (four) times daily. 07/14/24  Yes Arvis Huxley B, PA-C  loratadine  (CLARITIN ) 10 MG tablet Take 1 tablet (10 mg total) by mouth daily as needed for allergies or rhinitis. 07/14/24  Yes Arvis Huxley B, PA-C  albuterol  (VENTOLIN  HFA) 108 (90 Base) MCG/ACT inhaler Inhale 2 puffs into the lungs every 4 (four) hours as needed for wheezing or shortness of breath. 05/29/21   Akula, Vijaya, MD  amLODipine  (NORVASC ) 2.5 MG tablet Take 1 tablet (2.5 mg total) by mouth daily. 05/29/21   Akula, Vijaya, MD  aspirin  EC 81 MG tablet Take 1 tablet (81 mg total) by mouth daily. 12/29/16   Joshua Cathryne BROCKS, MD  donepezil  (ARICEPT ) 5 MG tablet Take 1 tablet (5 mg total) by mouth every morning. 10/13/21     donepezil  (ARICEPT ) 5 MG tablet Take 1 tablet (5 mg total) by mouth every morning 11/04/21     enoxaparin  (LOVENOX ) 40 MG/0.4ML injection Inject 0.4 mLs (40 mg total) into the skin daily for 7 days. 05/29/21 06/05/21  Akula, Vijaya, MD  glucose blood (FREESTYLE LITE) test strip USE AS INSTRUCTED 11/13/19   Lavina Damien BRAVO, FNP  latanoprost  (XALATAN ) 0.005 % ophthalmic solution PLACE 1 DROP INTO BOTH EYES AT BEDTIME 08/28/21     latanoprost  (XALATAN ) 0.005 % ophthalmic solution Instill 1 drop into both eyes at bedtime 08/27/22     latanoprost  (XALATAN ) 0.005 % ophthalmic solution Place 1 drop into both eyes at bedtime. 11/20/22     losartan  (COZAAR ) 100 MG tablet Take 1 tablet (100 mg total) by mouth once daily 10/28/21     losartan  (COZAAR ) 50 MG tablet Take 1 tablet (50 mg total) by mouth daily. 12/23/18   Joshua Cathryne BROCKS, MD  losartan  (COZAAR ) 50 MG tablet Take 1 tablet (50 mg total) by mouth once daily 10/03/21     mirabegron  ER  (MYRBETRIQ ) 50 MG TB24 tablet Take 1 tablet (50 mg total) by mouth daily. 04/07/21   MacDiarmid, Scott, MD  Multiple Vitamin (MULTIVITAMIN) capsule Take 1 capsule by mouth daily.    [provider]  NITROSTAT  0.4 MG SL tablet DISSOLVE ONE TABLET UNDER TONGUE AS NEED FOR CHEST PAIN AS DIRECTED 11/28/15   Glenard, Krichna, MD  oxybutynin  (DITROPAN -XL) 5 MG 24 hr tablet Take 1 tablet (5 mg total) by mouth once daily 08/06/21     pantoprazole  (PROTONIX ) 40 MG tablet TAKE 1 TABLET BY MOUTH 2 TIMES DAILY BEFORE MEALS 10/31/20 10/31/21  Whitaker, Selinda Hestle, PA-C  pantoprazole  (PROTONIX ) 40 MG tablet Take 1 tablet (40 mg total) by mouth 2 (two) times daily before a meal. 06/30/21     polyethylene glycol (MIRALAX  / GLYCOLAX ) 17 g packet Take 17 g by mouth daily as needed. 05/29/21   Akula,  Elgie, MD  pravastatin  (PRAVACHOL ) 80 MG tablet Take 1 tablet (80 mg total) by mouth nightly 03/20/21     pravastatin  (PRAVACHOL ) 80 MG tablet Take 1 tablet (80 mg total) by mouth at bedtime 06/30/21     senna-docusate (SENOKOT-S) 8.6-50 MG tablet Take 1 tablet by mouth at bedtime as needed for mild constipation. 05/29/21   Cherlyn Elgie, MD  sitaGLIPtin  (JANUVIA ) 100 MG tablet TAKE 1 TABLET (100 MG TOTAL) BY MOUTH ONCE DAILY 03/19/21 03/19/22      Family History Family History  Problem Relation Age of Onset   Diabetes Daughter    Hypertension Mother    Other Father        MVA   Diabetes Son    Breast cancer Neg Hx     Social History Social History   Tobacco Use   Smoking status: Former    Current packs/day: 0.00    Average packs/day: 2.0 packs/day for 10.0 years (20.0 ttl pk-yrs)    Types: Cigarettes    Start date: 11/16/1978    Quit date: 11/16/1988    Years since quitting: 35.6   Smokeless tobacco: Never  Vaping Use   Vaping status: Never Used  Substance Use Topics   Alcohol use: No    Alcohol/week: 0.0 standard drinks of alcohol   Drug use: No     Allergies   Ace inhibitors, Codeine, Codeine  sulfate, Ezetimibe, and Other   Review of Systems Review of Systems  Constitutional:  Negative for chills, diaphoresis, fatigue and fever.  HENT:  Positive for congestion and postnasal drip. Negative for ear pain, rhinorrhea, sinus pressure, sinus pain and sore throat.   Respiratory:  Negative for cough and shortness of breath.   Gastrointestinal:  Negative for abdominal pain, nausea and vomiting.  Musculoskeletal:  Negative for arthralgias and myalgias.  Skin:  Negative for rash.  Neurological:  Positive for headaches. Negative for dizziness and weakness.  Hematological:  Negative for adenopathy.     Physical Exam Triage Vital Signs ED Triage Vitals  Enc Vitals Group     BP      Pulse      Resp      Temp      Temp src      SpO2      Weight      Height      Head Circumference      Peak Flow      Pain Score      Pain Loc      Pain Edu?      Excl. in GC?    No data found.  Updated Vital Signs BP (!) 158/78 (BP Location: Left Arm)   Pulse 70   Temp 99.1 F (37.3 C) (Oral)   Resp 14   Ht 5' 11 (1.803 m)   Wt 160 lb 0.9 oz (72.6 kg)   SpO2 97%   BMI 22.32 kg/m      Physical Exam Vitals and nursing note reviewed.  Constitutional:      General: She is not in acute distress.    Appearance: Normal appearance. She is not ill-appearing or toxic-appearing.  HENT:     Head: Normocephalic and atraumatic.     Right Ear: Tympanic membrane, ear canal and external ear normal.     Left Ear: Tympanic membrane, ear canal and external ear normal.     Nose: Congestion present.     Mouth/Throat:     Mouth: Mucous membranes are moist.  Pharynx: Oropharynx is clear. No posterior oropharyngeal erythema.  Eyes:     General: No scleral icterus.       Right eye: No discharge.        Left eye: No discharge.     Conjunctiva/sclera: Conjunctivae normal.  Cardiovascular:     Rate and Rhythm: Normal rate and regular rhythm.     Heart sounds: Normal heart sounds.  Pulmonary:      Effort: Pulmonary effort is normal. No respiratory distress.     Breath sounds: Normal breath sounds.  Musculoskeletal:     Cervical back: Neck supple.  Skin:    General: Skin is dry.  Neurological:     General: No focal deficit present.     Mental Status: She is alert. Mental status is at baseline.     Motor: No weakness.     Gait: Gait normal.  Psychiatric:        Mood and Affect: Mood normal.        Behavior: Behavior normal.      UC Treatments / Results  Labs (all labs ordered are listed, but only abnormal results are displayed) Labs Reviewed  SARS CORONAVIRUS 2 BY RT PCR     EKG   Radiology No results found.  Procedures Procedures (including critical care time)  Medications Ordered in UC Medications - No data to display  Initial Impression / Assessment and Plan / UC Course  I have reviewed the triage vital signs and the nursing notes.  Pertinent labs & imaging results that were available during my care of the patient were reviewed by me and considered in my medical decision making (see chart for details).  83 year old female presenting for 1 week history of nasal congestion and postnasal drainage. No associated fever, cough, sinus pain, or breathing difficulty.  Blood pressure elevated in clinic 158/78.  Other vital signs normal and stable.  Patient is overall well-appearing.  On exam she has nasal congestion.  Chest clear to auscultation and heart regular rate rhythm.  COVID test obtained.  Symptoms consistent with virus versus allergies.  Advised switching to Atrovent  nasal spray.  Also sent Claritin  and benzonatate .  Advised increasing rest and fluids.  Follow-up for fever or worsening symptoms or if not feeling better in the next week.  Also advised patient to keep a check on her blood pressure and follow-up with her primary care provider if it is consistently greater than 140/90.  Patient agrees.  States that her daughter checks her blood pressure every  morning.  Negative COVID test.  Final Clinical Impressions(s) / UC Diagnoses   Final diagnoses:  Viral upper respiratory tract infection  Nasal congestion  Essential hypertension     Discharge Instructions      -Negative COVID test. - Your symptoms are consistent with a virus versus allergies.  I have sent an antihistamine and a different nasal spray for you.  Increase rest and fluids. - Follow-up if you are not feeling better in the next week or if you develop a fever or worsening symptoms. -Your blood pressure is high. Continue meds.  Continue to keep a log and follow-up with your primary care provider especially if it is over 140/90 consistently.        ED Prescriptions     Medication Sig Dispense Auth. Provider   ipratropium (ATROVENT ) 0.06 % nasal spray Place 2 sprays into both nostrils 4 (four) times daily. 15 mL Arvis Huxley B, PA-C   loratadine  (CLARITIN ) 10 MG tablet Take  1 tablet (10 mg total) by mouth daily as needed for allergies or rhinitis. 30 tablet Alzada Brazee B, PA-C      PDMP not reviewed this encounter.      Arvis Jolan NOVAK, PA-C 07/14/24 [phone]

## 2024-07-14 NOTE — Discharge Instructions (Addendum)
-  Negative COVID test. - Your symptoms are consistent with a virus versus allergies.  I have sent an antihistamine and a different nasal spray for you.  Increase rest and fluids. - Follow-up if you are not feeling better in the next week or if you develop a fever or worsening symptoms. -Your blood pressure is high. Continue meds.  Continue to keep a log and follow-up with your primary care provider especially if it is over 140/90 consistently.

## 2024-07-14 NOTE — ED Triage Notes (Signed)
 Patient reports stuffy nose, runny nose, and sinus congestion that started almost a week ago.  Patient denies fevers.

## 2024-11-23 ENCOUNTER — Emergency Department
Admission: EM | Admit: 2024-11-23 | Discharge: 2024-11-23 | Disposition: A | Discharge status: Home / Self Care | Attending: Emergency Medicine | Admitting: Emergency Medicine

## 2024-11-23 ENCOUNTER — Emergency Department

## 2024-11-23 ENCOUNTER — Other Ambulatory Visit: Payer: Self-pay

## 2024-11-23 ENCOUNTER — Encounter: Payer: Self-pay | Admitting: Intensive Care

## 2024-11-23 DIAGNOSIS — M25512 Pain in left shoulder: Secondary | ICD-10-CM | POA: Diagnosis present

## 2024-11-23 LAB — CBC
HCT: 35.8 % — ABNORMAL LOW (ref 36.0–46.0)
Hemoglobin: 11.6 g/dL — ABNORMAL LOW (ref 12.0–15.0)
MCH: 31.1 pg (ref 26.0–34.0)
MCHC: 32.4 g/dL (ref 30.0–36.0)
MCV: 96 fL (ref 80.0–100.0)
Platelets: 211 K/uL (ref 150–400)
RBC: 3.73 MIL/uL — ABNORMAL LOW (ref 3.87–5.11)
RDW: 13.7 % (ref 11.5–15.5)
WBC: 5.8 K/uL (ref 4.0–10.5)
nRBC: 0 % (ref 0.0–0.2)

## 2024-11-23 LAB — BASIC METABOLIC PANEL WITH GFR
Anion gap: 11 (ref 5–15)
BUN: 17 mg/dL (ref 8–23)
CO2: 25 mmol/L (ref 22–32)
Calcium: 9.7 mg/dL (ref 8.9–10.3)
Chloride: 104 mmol/L (ref 98–111)
Creatinine, Ser: 0.87 mg/dL (ref 0.44–1.00)
GFR, Estimated: >60 mL/min
Glucose, Bld: 164 mg/dL — ABNORMAL HIGH (ref 70–99)
Potassium: 3.5 mmol/L (ref 3.5–5.1)
Sodium: 139 mmol/L (ref 135–145)

## 2024-11-23 LAB — TROPONIN T, HIGH SENSITIVITY
Troponin T High Sensitivity: 22 ng/L — ABNORMAL HIGH (ref 0–19)
Troponin T High Sensitivity: 20 ng/L — ABNORMAL HIGH (ref 0–19)

## 2024-11-23 NOTE — ED Notes (Signed)
Triage delayed due to patient being in restroom

## 2024-11-23 NOTE — ED Notes (Signed)
Dr. Goodman at bedside for reevaluation ?

## 2024-11-23 NOTE — ED Provider Notes (Signed)
 "  [facility] Provider Note    Event Date/Time   First MD Initiated Contact with Patient 11/23/24 1522     (approximate)   History   Left shoulder pain   HPI  Breanna Petty is a 84 y.o. female   who presents to the emergency department today because concerns for left shoulder pain.  Patient states that the pain started about 5 days ago.  The patient denies any unusual exertion or activity prior to the pain is starting, no trauma.  She states she did do a load of laundry but she does that normally.  She denies any associated pain radiating down her arm.  She denies any shortness of breath or significant chest pain.  She does have some increased pain with movement.      Physical Exam   Triage Vital Signs: ED Triage Vitals  Encounter Vitals Group     BP 11/23/24 1302 (!) 189/75     Girls Systolic BP Percentile --      Girls Diastolic BP Percentile --      Boys Systolic BP Percentile --      Boys Diastolic BP Percentile --      Pulse Rate 11/23/24 1302 70     Resp 11/23/24 1302 20     Temp 11/23/24 1302 97.8 F (36.6 C)     Temp Source 11/23/24 1302 Oral     SpO2 11/23/24 1302 100 %     Weight 11/23/24 1305 163 lb (73.9 kg)     Height 11/23/24 1305 5' 11 (1.803 m)     Head Circumference --      Peak Flow --      Pain Score 11/23/24 1304 4     Pain Loc --      Pain Education --      Exclude from Growth Chart --     Most recent vital signs: Vitals:   11/23/24 1302  BP: (!) 189/75  Pulse: 70  Resp: 20  Temp: 97.8 F (36.6 C)  SpO2: 100%   General: Awake, alert, oriented. CV:  Good peripheral perfusion. Regular rate and rhythm. Resp:  Normal effort. Lungs clear. Abd:  No distention.  Other:  Left shoulder with some tenderness to manipulation.   ED Results / Procedures / Treatments   Labs (all labs ordered are listed, but only abnormal results are displayed) Labs Reviewed  BASIC METABOLIC PANEL WITH GFR - Abnormal; Notable for the  following components:      Result Value   Glucose, Bld 164 (*)    All other components within normal limits  CBC - Abnormal; Notable for the following components:   RBC 3.73 (*)    Hemoglobin 11.6 (*)    HCT 35.8 (*)    All other components within normal limits  TROPONIN T, HIGH SENSITIVITY - Abnormal; Notable for the following components:   Troponin T High Sensitivity 22 (*)    All other components within normal limits  TROPONIN T, HIGH SENSITIVITY - Abnormal; Notable for the following components:   Troponin T High Sensitivity 20 (*)    All other components within normal limits     EKG  I, Guadalupe Eagles, attending physician, personally viewed and interpreted this EKG  EKG Time: 1308 Rate: 68 Rhythm: normal sinus rhythm Axis: normal Intervals: qtc 425 QRS: narrow, q waves v1 ST changes: no st elevation Impression: abnormal ekg   RADIOLOGY I independently interpreted and visualized the CXR. My interpretation: No  pneumonia Radiology interpretation:  IMPRESSION:  No active cardiopulmonary disease.      PROCEDURES:  Critical Care performed: No   MEDICATIONS ORDERED IN ED: Medications - No data to display   IMPRESSION / MDM / ASSESSMENT AND PLAN / ED COURSE  I reviewed the triage vital signs and the nursing notes.                              Differential diagnosis includes, but is not limited to, arthritis, fracture, dislocation, pneumonia  Patient's presentation is most consistent with acute presentation with potential threat to life or bodily function.   Patient presented to the emergency department today because concerns for left shoulder pain x 5 days.  On exam no deformity.  Neurovascularly intact distally.  Chest x-ray without concerning abnormality.  Blood work including troponin was checked.  Troponin was very minimally elevated.  It was repeated and stayed minimally elevated.  At this time I have a low concern that it signifies ACS.  I think likely  patient has slight baseline elevation.  This time do think would be reasonable for patient to be discharged.  FINAL CLINICAL IMPRESSION(S) / ED DIAGNOSES   Final diagnoses:  Acute pain of left shoulder      Note:  This document was prepared using Dragon voice recognition software and may include unintentional dictation errors.    Floy Roberts, MD 11/23/24 [phone]  "

## 2024-11-23 NOTE — ED Triage Notes (Signed)
 Patient reports feeling weak, left arm pain, and left sided dull chest pain.
